# Patient Record
Sex: Male | Born: 1960 | Race: Black or African American | Hispanic: No | Marital: Single | Smoking: Current every day smoker
Health system: Southern US, Community
[De-identification: ages and names within clinical notes are randomized; demographics above are authoritative.]

## PROBLEM LIST (undated history)

## (undated) DIAGNOSIS — R39198 Other difficulties with micturition: Secondary | ICD-10-CM

## (undated) DIAGNOSIS — D649 Anemia, unspecified: Secondary | ICD-10-CM

## (undated) DIAGNOSIS — N400 Enlarged prostate without lower urinary tract symptoms: Secondary | ICD-10-CM

## (undated) DIAGNOSIS — K922 Gastrointestinal hemorrhage, unspecified: Secondary | ICD-10-CM

## (undated) DIAGNOSIS — N453 Epididymo-orchitis: Secondary | ICD-10-CM

## (undated) DIAGNOSIS — N4283 Cyst of prostate: Secondary | ICD-10-CM

## (undated) DIAGNOSIS — A599 Trichomoniasis, unspecified: Secondary | ICD-10-CM

## (undated) DIAGNOSIS — K859 Acute pancreatitis without necrosis or infection, unspecified: Secondary | ICD-10-CM

## (undated) DIAGNOSIS — E119 Type 2 diabetes mellitus without complications: Secondary | ICD-10-CM

## (undated) DIAGNOSIS — S82201A Unspecified fracture of shaft of right tibia, initial encounter for closed fracture: Secondary | ICD-10-CM

## (undated) DIAGNOSIS — F1010 Alcohol abuse, uncomplicated: Secondary | ICD-10-CM

## (undated) DIAGNOSIS — S82401A Unspecified fracture of shaft of right fibula, initial encounter for closed fracture: Secondary | ICD-10-CM

## (undated) DIAGNOSIS — N492 Inflammatory disorders of scrotum: Secondary | ICD-10-CM

## (undated) DIAGNOSIS — C187 Malignant neoplasm of sigmoid colon: Secondary | ICD-10-CM

## (undated) DIAGNOSIS — C801 Malignant (primary) neoplasm, unspecified: Secondary | ICD-10-CM

## (undated) DIAGNOSIS — K219 Gastro-esophageal reflux disease without esophagitis: Secondary | ICD-10-CM

## (undated) DIAGNOSIS — Z972 Presence of dental prosthetic device (complete) (partial): Secondary | ICD-10-CM

## (undated) DIAGNOSIS — R0600 Dyspnea, unspecified: Secondary | ICD-10-CM

## (undated) HISTORY — DX: Inflammatory disorders of scrotum: N49.2

## (undated) HISTORY — DX: Other difficulties with micturition: R39.198

## (undated) HISTORY — DX: Benign prostatic hyperplasia without lower urinary tract symptoms: N40.0

## (undated) HISTORY — PX: OTHER SURGICAL HISTORY: SHX169

## (undated) HISTORY — DX: Type 2 diabetes mellitus without complications: E11.9

## (undated) HISTORY — DX: Trichomoniasis, unspecified: A59.9

## (undated) HISTORY — PX: SCROTAL EXPLORATION: SHX2386

## (undated) HISTORY — DX: Epididymo-orchitis: N45.3

## (undated) HISTORY — PX: FRACTURE SURGERY: SHX138

---

## 1998-01-14 ENCOUNTER — Emergency Department (HOSPITAL_COMMUNITY): Admission: EM | Admit: 1998-01-14 | Discharge: 1998-01-14 | Payer: Self-pay | Admitting: Emergency Medicine

## 1999-03-16 ENCOUNTER — Encounter: Payer: Self-pay | Admitting: Emergency Medicine

## 1999-03-16 ENCOUNTER — Emergency Department (HOSPITAL_COMMUNITY): Admission: EM | Admit: 1999-03-16 | Discharge: 1999-03-16 | Payer: Self-pay | Admitting: Emergency Medicine

## 2000-02-11 ENCOUNTER — Emergency Department (HOSPITAL_COMMUNITY): Admission: EM | Admit: 2000-02-11 | Discharge: 2000-02-11 | Payer: Self-pay | Admitting: Emergency Medicine

## 2000-04-03 ENCOUNTER — Emergency Department (HOSPITAL_COMMUNITY): Admission: EM | Admit: 2000-04-03 | Discharge: 2000-04-03 | Payer: Self-pay | Admitting: Emergency Medicine

## 2004-05-19 ENCOUNTER — Emergency Department: Payer: Self-pay | Admitting: Emergency Medicine

## 2004-05-19 ENCOUNTER — Other Ambulatory Visit: Payer: Self-pay

## 2005-01-09 ENCOUNTER — Emergency Department: Payer: Self-pay | Admitting: Emergency Medicine

## 2005-01-09 ENCOUNTER — Other Ambulatory Visit: Payer: Self-pay

## 2005-01-21 ENCOUNTER — Inpatient Hospital Stay: Payer: Self-pay | Admitting: Internal Medicine

## 2005-09-07 ENCOUNTER — Emergency Department: Payer: Self-pay | Admitting: Emergency Medicine

## 2006-01-20 ENCOUNTER — Emergency Department: Payer: Self-pay | Admitting: Emergency Medicine

## 2006-02-15 ENCOUNTER — Other Ambulatory Visit: Payer: Self-pay

## 2006-02-15 ENCOUNTER — Emergency Department: Payer: Self-pay | Admitting: Emergency Medicine

## 2006-02-16 ENCOUNTER — Inpatient Hospital Stay: Payer: Self-pay | Admitting: Internal Medicine

## 2006-05-18 ENCOUNTER — Other Ambulatory Visit: Payer: Self-pay

## 2006-05-18 ENCOUNTER — Inpatient Hospital Stay: Payer: Self-pay | Admitting: Internal Medicine

## 2006-06-16 ENCOUNTER — Ambulatory Visit: Payer: Self-pay | Admitting: Pain Medicine

## 2006-09-07 ENCOUNTER — Emergency Department: Payer: Self-pay | Admitting: Emergency Medicine

## 2006-09-21 ENCOUNTER — Ambulatory Visit: Payer: Self-pay | Admitting: Nurse Practitioner

## 2006-09-28 ENCOUNTER — Ambulatory Visit: Payer: Self-pay | Admitting: Gastroenterology

## 2006-11-29 ENCOUNTER — Inpatient Hospital Stay: Payer: Self-pay | Admitting: Internal Medicine

## 2007-08-25 ENCOUNTER — Inpatient Hospital Stay: Payer: Self-pay | Admitting: Internal Medicine

## 2007-08-25 ENCOUNTER — Other Ambulatory Visit: Payer: Self-pay

## 2008-12-28 ENCOUNTER — Ambulatory Visit: Payer: Self-pay | Admitting: Oncology

## 2009-01-05 ENCOUNTER — Emergency Department: Payer: Self-pay | Admitting: Emergency Medicine

## 2009-01-08 ENCOUNTER — Ambulatory Visit: Payer: Self-pay | Admitting: Oncology

## 2009-01-24 ENCOUNTER — Ambulatory Visit: Payer: Self-pay | Admitting: Oncology

## 2009-01-28 ENCOUNTER — Ambulatory Visit: Payer: Self-pay | Admitting: Oncology

## 2009-01-30 ENCOUNTER — Ambulatory Visit: Payer: Self-pay | Admitting: Oncology

## 2009-02-28 ENCOUNTER — Ambulatory Visit: Payer: Self-pay | Admitting: Oncology

## 2009-05-20 ENCOUNTER — Emergency Department: Payer: Self-pay

## 2009-05-26 ENCOUNTER — Emergency Department: Payer: Self-pay | Admitting: Internal Medicine

## 2010-04-10 ENCOUNTER — Inpatient Hospital Stay: Payer: Self-pay | Admitting: *Deleted

## 2010-08-12 ENCOUNTER — Emergency Department: Payer: Self-pay | Admitting: Emergency Medicine

## 2011-01-02 ENCOUNTER — Emergency Department: Payer: Self-pay | Admitting: Emergency Medicine

## 2011-07-09 ENCOUNTER — Emergency Department: Payer: Self-pay

## 2011-07-09 LAB — CBC
HCT: 43.4 % (ref 40.0–52.0)
HGB: 14.9 g/dL (ref 13.0–18.0)
MCH: 31.4 pg (ref 26.0–34.0)
MCHC: 34.2 g/dL (ref 32.0–36.0)
MCV: 92 fL (ref 80–100)
Platelet: 144 10*3/uL — ABNORMAL LOW (ref 150–440)
RBC: 4.74 10*6/uL (ref 4.40–5.90)
RDW: 13 % (ref 11.5–14.5)
WBC: 8.6 10*3/uL (ref 3.8–10.6)

## 2011-07-09 LAB — URINALYSIS, COMPLETE
Bacteria: NONE SEEN
Bilirubin,UR: NEGATIVE
Blood: NEGATIVE
Glucose,UR: >=500 mg/dL (ref 0–75)
Leukocyte Esterase: NEGATIVE
Nitrite: NEGATIVE
Ph: 5 (ref 4.5–8.0)
Protein: NEGATIVE
RBC,UR: <1 /HPF (ref 0–5)
Specific Gravity: 1.031 (ref 1.003–1.030)
Squamous Epithelial: NONE SEEN
WBC UR: <1 /HPF (ref 0–5)

## 2011-07-09 LAB — COMPREHENSIVE METABOLIC PANEL
Albumin: 4 g/dL (ref 3.4–5.0)
Alkaline Phosphatase: 61 U/L (ref 50–136)
Anion Gap: 11 (ref 7–16)
BUN: 11 mg/dL (ref 7–18)
Bilirubin,Total: 0.5 mg/dL (ref 0.2–1.0)
Calcium, Total: 9.4 mg/dL (ref 8.5–10.1)
Chloride: 98 mmol/L (ref 98–107)
Co2: 25 mmol/L (ref 21–32)
Creatinine: 0.87 mg/dL (ref 0.60–1.30)
EGFR (African American): >60
EGFR (Non-African Amer.): >60
Glucose: 519 mg/dL (ref 65–99)
Osmolality: 291 (ref 275–301)
Potassium: 4 mmol/L (ref 3.5–5.1)
SGOT(AST): 36 U/L (ref 15–37)
SGPT (ALT): 40 U/L
Sodium: 134 mmol/L — ABNORMAL LOW (ref 136–145)
Total Protein: 8.2 g/dL (ref 6.4–8.2)

## 2011-07-09 LAB — ETHANOL
Ethanol %: <0.003 % (ref 0.000–0.080)
Ethanol: <3 mg/dL

## 2011-07-09 LAB — PROTIME-INR
INR: 0.9
Prothrombin Time: 12.7 secs (ref 11.5–14.7)

## 2011-07-09 LAB — LIPASE, BLOOD: Lipase: 51 U/L — ABNORMAL LOW (ref 73–393)

## 2012-09-09 LAB — CBC WITH DIFFERENTIAL/PLATELET
Basophil #: 0 10*3/uL (ref 0.0–0.1)
Basophil %: 0.4 %
Eosinophil #: 0.1 10*3/uL (ref 0.0–0.7)
Eosinophil %: 1.4 %
HCT: 53.2 % — ABNORMAL HIGH (ref 40.0–52.0)
HGB: 18 g/dL (ref 13.0–18.0)
Lymphocyte #: 1.1 10*3/uL (ref 1.0–3.6)
Lymphocyte %: 12.8 %
MCH: 31.9 pg (ref 26.0–34.0)
MCHC: 33.9 g/dL (ref 32.0–36.0)
MCV: 94 fL (ref 80–100)
Monocyte #: 0.8 x10 3/mm (ref 0.2–1.0)
Monocyte %: 8.7 %
Neutrophil #: 6.7 10*3/uL — ABNORMAL HIGH (ref 1.4–6.5)
Neutrophil %: 76.7 %
Platelet: 153 10*3/uL (ref 150–440)
RBC: 5.65 10*6/uL (ref 4.40–5.90)
RDW: 13.4 % (ref 11.5–14.5)
WBC: 8.7 10*3/uL (ref 3.8–10.6)

## 2012-09-09 LAB — COMPREHENSIVE METABOLIC PANEL
Albumin: 4.3 g/dL (ref 3.4–5.0)
Alkaline Phosphatase: 100 U/L (ref 50–136)
Anion Gap: 19 — ABNORMAL HIGH (ref 7–16)
BUN: 21 mg/dL — ABNORMAL HIGH (ref 7–18)
Bilirubin,Total: 0.7 mg/dL (ref 0.2–1.0)
Calcium, Total: 10 mg/dL (ref 8.5–10.1)
Chloride: 100 mmol/L (ref 98–107)
Co2: 11 mmol/L — ABNORMAL LOW (ref 21–32)
Creatinine: 1.16 mg/dL (ref 0.60–1.30)
EGFR (African American): >60
EGFR (Non-African Amer.): >60
Glucose: 400 mg/dL — ABNORMAL HIGH (ref 65–99)
Osmolality: 281 (ref 275–301)
Potassium: 4.9 mmol/L (ref 3.5–5.1)
SGOT(AST): 24 U/L (ref 15–37)
SGPT (ALT): 38 U/L (ref 12–78)
Sodium: 130 mmol/L — ABNORMAL LOW (ref 136–145)
Total Protein: 9.8 g/dL — ABNORMAL HIGH (ref 6.4–8.2)

## 2012-09-09 LAB — LIPASE, BLOOD: Lipase: 938 U/L — ABNORMAL HIGH (ref 73–393)

## 2012-09-09 LAB — MAGNESIUM: Magnesium: 2 mg/dL

## 2012-09-09 LAB — TSH: Thyroid Stimulating Horm: 2.52 u[IU]/mL

## 2012-09-09 LAB — ETHANOL
Ethanol %: <0.003 % (ref 0.000–0.080)
Ethanol: <3 mg/dL

## 2012-09-09 LAB — TROPONIN I: Troponin-I: <0.02 ng/mL

## 2012-09-10 ENCOUNTER — Inpatient Hospital Stay: Payer: Self-pay | Admitting: Specialist

## 2012-09-10 LAB — URINALYSIS, COMPLETE
Bilirubin,UR: NEGATIVE
Glucose,UR: >=500 mg/dL (ref 0–75)
Hyaline Cast: 6
Leukocyte Esterase: NEGATIVE
Nitrite: NEGATIVE
Ph: 5 (ref 4.5–8.0)
RBC,UR: 3 /HPF (ref 0–5)
Specific Gravity: 1.028 (ref 1.003–1.030)
Squamous Epithelial: NONE SEEN
WBC UR: 5 /HPF (ref 0–5)

## 2012-09-10 LAB — CBC WITH DIFFERENTIAL/PLATELET
Basophil #: 0 10*3/uL (ref 0.0–0.1)
Basophil %: 0.3 %
Eosinophil #: 0.3 10*3/uL (ref 0.0–0.7)
Eosinophil %: 3.2 %
HCT: 42.6 % (ref 40.0–52.0)
HGB: 14.4 g/dL (ref 13.0–18.0)
Lymphocyte #: 1.8 10*3/uL (ref 1.0–3.6)
Lymphocyte %: 21 %
MCHC: 33.9 g/dL (ref 32.0–36.0)
MCV: 93 fL (ref 80–100)
Monocyte #: 0.9 x10 3/mm (ref 0.2–1.0)
Monocyte %: 10.7 %
Neutrophil %: 64.8 %
RDW: 13.1 % (ref 11.5–14.5)
WBC: 8.6 10*3/uL (ref 3.8–10.6)

## 2012-09-10 LAB — DRUG SCREEN, URINE
Amphetamines, Ur Screen: NEGATIVE (ref ?–1000)
Barbiturates, Ur Screen: NEGATIVE (ref ?–200)
Cannabinoid 50 Ng, Ur ~~LOC~~: NEGATIVE (ref ?–50)
Opiate, Ur Screen: POSITIVE (ref ?–300)

## 2012-09-10 LAB — BASIC METABOLIC PANEL
Calcium, Total: 8.3 mg/dL — ABNORMAL LOW (ref 8.5–10.1)
Chloride: 112 mmol/L — ABNORMAL HIGH (ref 98–107)
Co2: 14 mmol/L — ABNORMAL LOW (ref 21–32)
Co2: 18 mmol/L — ABNORMAL LOW (ref 21–32)
Creatinine: 0.89 mg/dL (ref 0.60–1.30)
EGFR (African American): >60
EGFR (Non-African Amer.): >60
Glucose: 224 mg/dL — ABNORMAL HIGH (ref 65–99)
Glucose: 149 mg/dL — ABNORMAL HIGH (ref 65–99)
Potassium: 3.7 mmol/L (ref 3.5–5.1)
Potassium: 3.3 mmol/L — ABNORMAL LOW (ref 3.5–5.1)
Sodium: 139 mmol/L (ref 136–145)
Sodium: 140 mmol/L (ref 136–145)

## 2012-09-10 LAB — LIPASE, BLOOD: Lipase: 635 U/L — ABNORMAL HIGH (ref 73–393)

## 2012-09-11 LAB — CBC WITH DIFFERENTIAL/PLATELET
Basophil %: 0.2 %
Eosinophil #: 0.1 10*3/uL (ref 0.0–0.7)
Eosinophil %: 2.5 %
HGB: 12.3 g/dL — ABNORMAL LOW (ref 13.0–18.0)
Lymphocyte #: 1.4 10*3/uL (ref 1.0–3.6)
Lymphocyte %: 26.1 %
Neutrophil #: 3.5 10*3/uL (ref 1.4–6.5)
Neutrophil %: 63.6 %
Platelet: 110 10*3/uL — ABNORMAL LOW (ref 150–440)
RDW: 13 % (ref 11.5–14.5)

## 2012-09-11 LAB — BASIC METABOLIC PANEL
Anion Gap: 11 (ref 7–16)
BUN: 9 mg/dL (ref 7–18)
Calcium, Total: 7.3 mg/dL — ABNORMAL LOW (ref 8.5–10.1)
Co2: 16 mmol/L — ABNORMAL LOW (ref 21–32)
Creatinine: 0.65 mg/dL (ref 0.60–1.30)
EGFR (African American): >60
EGFR (Non-African Amer.): >60
Glucose: 188 mg/dL — ABNORMAL HIGH (ref 65–99)
Potassium: 3 mmol/L — ABNORMAL LOW (ref 3.5–5.1)

## 2012-09-11 LAB — LIPID PANEL
Ldl Cholesterol, Calc: 149 mg/dL — ABNORMAL HIGH (ref 0–100)
Triglycerides: 119 mg/dL (ref 0–200)
VLDL Cholesterol, Calc: 24 mg/dL (ref 5–40)

## 2012-09-11 LAB — LIPASE, BLOOD: Lipase: 414 U/L — ABNORMAL HIGH (ref 73–393)

## 2012-09-11 LAB — HEPATIC FUNCTION PANEL A (ARMC)
Alkaline Phosphatase: 61 U/L (ref 50–136)
Bilirubin,Total: 0.5 mg/dL (ref 0.2–1.0)
SGPT (ALT): 29 U/L (ref 12–78)
Total Protein: 5.9 g/dL — ABNORMAL LOW (ref 6.4–8.2)

## 2012-09-11 LAB — HEMOGLOBIN A1C: Hemoglobin A1C: 13 % — ABNORMAL HIGH (ref 4.2–6.3)

## 2012-09-12 LAB — BASIC METABOLIC PANEL
Anion Gap: 11 (ref 7–16)
BUN: 6 mg/dL — ABNORMAL LOW (ref 7–18)
Chloride: 111 mmol/L — ABNORMAL HIGH (ref 98–107)
Co2: 17 mmol/L — ABNORMAL LOW (ref 21–32)
Creatinine: 0.7 mg/dL (ref 0.60–1.30)
Sodium: 139 mmol/L (ref 136–145)

## 2012-09-15 LAB — CULTURE, BLOOD (SINGLE)

## 2013-08-08 ENCOUNTER — Ambulatory Visit: Payer: Self-pay

## 2013-11-12 ENCOUNTER — Emergency Department: Payer: Self-pay | Admitting: Emergency Medicine

## 2014-02-22 ENCOUNTER — Emergency Department: Payer: Self-pay | Admitting: Emergency Medicine

## 2014-07-20 ENCOUNTER — Emergency Department: Payer: Self-pay | Admitting: Emergency Medicine

## 2014-07-20 LAB — URINALYSIS, COMPLETE
Bacteria: NONE SEEN
Bilirubin,UR: NEGATIVE
Nitrite: NEGATIVE
PH: 6 (ref 4.5–8.0)
RBC,UR: 90 /HPF (ref 0–5)
Specific Gravity: 1.023 (ref 1.003–1.030)
WBC UR: 391 /HPF (ref 0–5)

## 2014-07-20 LAB — COMPREHENSIVE METABOLIC PANEL
ALK PHOS: 82 U/L
Albumin: 2.9 g/dL — ABNORMAL LOW (ref 3.4–5.0)
Anion Gap: 13 (ref 7–16)
BUN: 6 mg/dL — ABNORMAL LOW (ref 7–18)
Bilirubin,Total: 0.4 mg/dL (ref 0.2–1.0)
CALCIUM: 9.1 mg/dL (ref 8.5–10.1)
Chloride: 95 mmol/L — ABNORMAL LOW (ref 98–107)
Co2: 23 mmol/L (ref 21–32)
Creatinine: 0.8 mg/dL (ref 0.60–1.30)
EGFR (African American): >60
GLUCOSE: 293 mg/dL — AB (ref 65–99)
OSMOLALITY: 271 (ref 275–301)
Potassium: 3 mmol/L — ABNORMAL LOW (ref 3.5–5.1)
SGOT(AST): 20 U/L (ref 15–37)
SGPT (ALT): 20 U/L
SODIUM: 131 mmol/L — AB (ref 136–145)
TOTAL PROTEIN: 7.9 g/dL (ref 6.4–8.2)

## 2014-07-20 LAB — CBC WITH DIFFERENTIAL/PLATELET
BASOS PCT: 0.8 %
Basophil #: 0.2 10*3/uL — ABNORMAL HIGH (ref 0.0–0.1)
EOS PCT: 0.2 %
Eosinophil #: 0 10*3/uL (ref 0.0–0.7)
HCT: 41.4 % (ref 40.0–52.0)
HGB: 13.6 g/dL (ref 13.0–18.0)
LYMPHS ABS: 2 10*3/uL (ref 1.0–3.6)
LYMPHS PCT: 10 %
MCH: 30.2 pg (ref 26.0–34.0)
MCHC: 32.8 g/dL (ref 32.0–36.0)
MCV: 92 fL (ref 80–100)
MONOS PCT: 8.4 %
Monocyte #: 1.7 x10 3/mm — ABNORMAL HIGH (ref 0.2–1.0)
Neutrophil #: 16.2 10*3/uL — ABNORMAL HIGH (ref 1.4–6.5)
Neutrophil %: 80.6 %
Platelet: 250 10*3/uL (ref 150–440)
RBC: 4.5 10*6/uL (ref 4.40–5.90)
RDW: 13.2 % (ref 11.5–14.5)
WBC: 20.1 10*3/uL — AB (ref 3.8–10.6)

## 2014-08-06 ENCOUNTER — Emergency Department: Payer: Self-pay | Admitting: Emergency Medicine

## 2014-08-06 LAB — URINALYSIS, COMPLETE
BILIRUBIN, UR: NEGATIVE
Bacteria: NONE SEEN
NITRITE: NEGATIVE
Ph: 6 (ref 4.5–8.0)
Protein: NEGATIVE
Specific Gravity: 1.031 (ref 1.003–1.030)
WBC UR: 38 /HPF (ref 0–5)

## 2014-08-21 ENCOUNTER — Inpatient Hospital Stay: Payer: Self-pay | Admitting: Internal Medicine

## 2014-10-20 NOTE — Discharge Summary (Signed)
PATIENT NAMEKEROLOS, [name] MR#:  [id number] DATE OF BIRTH:  1960-09-20  DATE OF ADMISSION:  09/10/2012 DATE OF DISCHARGE:  09/12/2012  HISTORY AND PHYSICAL For a detailed note, please take a look at the history and physical done on admission by Dr. Bridgette Habermann.     DISCHARGE DIAGNOSES:  1. Acute diabetic ketoacidosis, now resolved.  2. Uncontrolled diabetes secondary to noncompliance.  3. Hypokalemia.  4. Hyperlipidemia.   DIET: The patient is being discharged on an American Diabetic Association, low-fat, low-sodium diet.   ACTIVITY: As tolerated.   DISCHARGE FOLLOWUP: Follow up with the [facility] in the next 1 to 2 weeks.   DISCHARGE MEDICATIONS: Lantus 40 units at bedtime, NovoLog 50 units in the a.m., metformin 500 mg daily, Tylenol with hydrocodone 1 tab q. 6 hours as needed, Zofran 4 mg q.i.d. as needed for nausea or vomiting, Pravachol 20 mg at bedtime and potassium 20 mEq b.i.d. x 5 days.   Stout COURSE: Dr. Gaylyn Cheers from Gastroenterology.   PERTINENT HOSPITAL DIAGNOSTIC AND RADIOLOGICAL DATA: Chest x-ray done on admission showing no evidence of acute pneumonia, pleural effusion, acute cardiopulmonary disease. A CT scan of the abdomen and pelvis done with contrast showing mild inflammatory changes associated with the pancreas, especially superiorly, appears to be related to a 6 to 7 mm in diameter calcified stone in the pancreatic head which results in pancreatic ductal dilatation. There is no pseudocyst, no evidence of solid mass.  Calcifications within the pancreas are consistent with chronic pancreatitis, fatty infiltration of the liver.   HOSPITAL COURSE: This is a 54 year old male with medical problems as mentioned above, presented to the hospital with abdominal pain, nausea, vomiting and noted to be in acute diabetic ketoacidosis and also acute pancreatitis.   1. Diabetic ketoacidosis: This was likely due to the patient's medical  noncompliance. The patient's hemoglobin A1c was noted to be 13, although he says he is compliant with his insulin. The patient was initially admitted to the intensive care unit, started on IV fluids, insulin drip. Serial metabolic profiles were drawn. Shortly after admission, the patient was taken off the insulin drip, put back on some sliding scale insulin, and eventually put back on his scheduled Lantus and NovoLog. His anion gap has closed. His blood sugars have remained stable. He has had no further nausea or vomiting. He will resume his Lantus and NovoLog and metformin as stated for his diabetes.  2. Acute pancreatitis: It is actually unclear why he developed acute pancreatitis.  This is likely acute on chronic pancreatitis. The patient has a strong history of alcohol abuse, although he says he has not been drinking now. The patient was treated supportively with IV fluids, antiemetics, pain control. His lipase has now improved. His diet was slowly advanced from a clear liquid eventually to a low-fat diet which he has been able to tolerate. His CT scan did not show any evidence of any pseudocyst. He will be discharged on a low-fat diet and followup with his primary care physician as an outpatient.  3. Hypokalemia: This was likely secondary to his nausea and vomiting and also being on an insulin drip. His potassium still remains to be low around 2.8. He is being discharged on p.o. potassium supplements.  4. Hyperlipidemia: The patient's lipid profile was noted to be significantly abnormal with an LDL of 149, total cholesterol of 197 with an HDL of 24. His triglycerides were also mildly elevated. He is currently being discharged on  a statin.   CODE STATUS: The patient is a FULL CODE.   TIME SPENT: 40 minutes.  ____________________________ Curtis Heman. Curtis Carmine, MD vjs:cb D: 09/12/2012 13:31:28 ET T: 09/12/2012 21:43:44 ET JOB#: [id number]  cc: Curtis Heman. Curtis Carmine, MD, <Dictator> [place] MD ELECTRONICALLY SIGNED 09/13/2012 8:19

## 2014-10-20 NOTE — Consult Note (Signed)
See NP/PA note.  Pt feeling some better, still tender, lipase down to 940.  Hx of pancreatic cyst removed with surgery 2008.  Mild tenderness now, bowel sounds scant.Will follow with you.  Electronic Signatures: Manya Silvas (MD)  (Signed on 14-Mar-14 19:36)  Authored  Last Updated: 14-Mar-14 19:36 by Manya Silvas (MD)

## 2014-10-20 NOTE — H&P (Signed)
PATIENT NAMECLAIR, [name] MR#:  [id number] DATE OF BIRTH:  Mar 09, 1961  DATE OF ADMISSION:  09/10/2012  REFERRING PHYSICIAN:  Dr. Renard Hamper.  PRIMARY CARE PHYSICIAN:  Dr. Quay Burow at [facility].   CHIEF COMPLAINT:  Abdominal pain.   HISTORY OF PRESENT ILLNESS:  The patient is a pleasant 54 year old African American male with history of alcoholic liver disease and chronic pancreatitis, likely from alcohol with multiple episodes of pancreatitis, status post removal of a pancreatic pseudocyst per patient and uncontrolled diabetes, tobacco abuse who presents with worsening abdominal pain for the past week or so.  The patient stated that the pain started around last Sunday, initially was in the left upper quadrant.  The pain has been progressive, currently 10 out of 10 and bandlike across the abdomen.  The patient has also had nausea, vomiting twice today.  There is no diarrhea, but the patient has chronic constipation.   The patient describes abdominal pain while eating for several years now and he has difficulty eating secondary to this.  The patient has had significant weight loss recently, about 30 pounds or so, in the last few months.  He is not on any pancreatic enzyme replacement.  Furthermore, he has had very poor by mouth intake in the last 3 days as he has worsening abdominal pain when he eats as well as the nausea, vomiting recently.  He was noted to have significant elevation of blood glucose on admission to 400 with anion gap metabolic acidosis.  I ordered an ABG as it was initially not ordered which shows significant acidosis with pH of 7.22.  He appears to be in DKA and severely dehydrated.  Also, his lipase is 938.  Hospitalist service were contacted for further evaluation and management.   PAST MEDICAL HISTORY: 1.  History of alcoholic liver disease.  2.  History of alcoholic pancreatitis, multiple episodes in the past.  3.  Likely chronic pancreatitis.  4.  History of diabetes.  5.   Tobacco abuse.  6.  History of alcohol abuse in the past.   ALLERGIES:  He states he is not allergic to any medications, but in the chart it says ULTRAM, but he denies having allergy to ULTRAM.   PAST SURGICAL HISTORY:  1.  Status post right arm fracture repair.  2.  Surgery of the right leg.  3.  Removal of pancreatic pseudocyst.   SOCIAL HISTORY:  Is a smoker at half a pack, smoking for greater than two decades.  Used to be a heavy drinker, currently stopped about six years per patient.  Denies any illicit drugs.  Is disabled.   FAMILY HISTORY:  Father with gout and blood pressure.  Mom with hypertension as well.   REVIEW OF SYSTEMS:  CONSTITUTIONAL:  Has chills, significant weight loss of about 30 pounds in the last 3 to 4 months.  EYES:  Chronic blurry vision.  EARS, NOSE, THROAT:  No tinnitus or hearing loss.  RESPIRATORY:  No cough, wheezing, shortness of breath.  CARDIOVASCULAR:  Denies chest pain, orthopnea or swelling in the legs.  GASTROINTESTINAL:  Positive for nausea, vomiting as above.  No diarrhea.  Was positive for constipation.  Had dark stools on Sunday.  Has abdominal pain as described above.  No rectal bleeding.  GENITOURINARY:  Denies dysuria, hematuria.  HEMATOLOGY AND LYMPHATIC:  Denies anemia or easy bruising.  SKIN:  Denies any rashes.  MUSCULOSKELETAL:  Denies arthritis.  NEUROLOGIC:  Denies history of TIA, seizures or ataxia.  PSYCHIATRIC:  Denies anxiety or insomnia.   PHYSICAL EXAMINATION: VITAL SIGNS:  Temperature 97.5.  Initial pulse was 105, respiratory rate 22, blood pressure 124/85, O2 sat 98% on room air.  GENERAL:  The patient is chronically ill-appearing African American male lying in bed in mild distress.  HEENT:  Normocephalic, atraumatic.  Pupils are equal and reactive.  Muddy sclerae.  Pale conjunctivae.  Dry mucous membranes with poor dentition.  NECK:  Supple.  No thyroid tenderness.  No cervical lymphadenopathy.  CARDIOVASCULAR:  S1, S2,  tachycardic.  No murmurs, rubs or gallops.  LUNGS:  Clear to auscultation without wheezing, rhonchi or rales.  ABDOMEN:  Soft, tenderness in palpation of the left upper quadrant.  No rebound or guarding.  Hyperactive bowel sounds.  There is healed oblique scar in the left quadrant.  EXTREMITIES:  No significant lower extremity edema.  There is some dry scaly skin in the lower extremity, but no pitting edema.  There is positive clubbing of fingernails.  No other rashes otherwise.  NEUROLOGIC:  Cranial nerves II through XII grossly intact.  Strength is 5 over 5 all extremities.  Sensation is intact to light touch.  PSYCHIATRIC:  Awake, alert, oriented x 3.  Cooperative, pleasant.   LABORATORY DATA:  Initial serum glucose was 400, BUN 21, creatinine 1.16, sodium 130, potassium 4.9.  Serum CO2 of 11 with anion gap of 19, lipase is 938.  Alcohol level below detectable level.  LFTs shows total protein of 9.8, otherwise within normal limits.  Troponin negative.  TSH was 2.52, WBC 8.5, hemoglobin is 18, and hematocrit is 53.2.  Of note, his previous hemoglobin was 14.9 January of last year.  UA, 2+ ketones, 2+ blood, no nitrites or leukocyte esterase, 5 WBCs.  ABG shows pH of 7.22, pCO2 22, pO2 of 105, lactate of 1.  EKG, normal sinus rhythm, possible left atrial enlargement.  No acute ST elevations or depressions.   ASSESSMENT AND PLAN:  We have a 54 year old African American male with history of chronic pancreatitis, uncontrolled diabetes, history of alcohol abuse in the past who presents with what appears to be more of a chronic abdominal pain, by mouth intake worsening in the last week associated with nausea, vomiting, severe dehydration, hyperglycemia with metabolic acidosis.  The patient has diabetic ketoacidosis with acidosis, hyperglycemia and ketonuria.  Would admit the patient to critical care unit and start insulin per diabetic ketoacidosis protocol with aggressive IV fluid resuscitation.  Furthermore,  patient appears to have acute on chronic pancreatitis.  The patient is not on any pancreatic enzyme replacement therapy and has signs of chronic abdominal pain and is actually afraid to eat as it can precipitate the pain depending on the type of food he has.  We would make the patient nothing by mouth for now, start the patient on morphine and Zofran for the symptomatic relief.  Would also obtain a CAT scan for further evaluation.  The patient has a healed surgical scar which likely is from a pseudocyst evacuation.  He does not see a gastroenterologist as an outpatient.  We will obtain a GI consult and trend the lipase.  He does not drink anymore, but I think the damage to the pancreas has likely been done and he probably needs chronic pancreatic enzyme replacement therapy.  We will check a hemoglobin A1c as his sugars are chronically in the 200s to 300s which does not help the picture.  Would hold the metformin for now.  The patient appears to be  significantly volume-depleted given the elevation of hemoglobin and hematocrit and physical exam signs of dehydration.  He would be volume-resuscitated with IV fluids.  He does smoke tobacco and he was counseled for three minutes about cessation.  We would monitor his blood sugars frequently per the diabetic ketoacidosis protocol and switch him to Lantus by discharge.   CODE STATUS:  THE PATIENT IS A FULL CODE.  TOTAL TIME SPENT:  Is 60 minutes of critical care time.     ____________________________ Vivien Presto, MD sa:ea D: 09/10/2012 02:12:58 ET T: 09/10/2012 02:57:02 ET JOB#: [id number]  cc: Vivien Presto, MD, <Dictator> Dr. Wenda Low [facility] MD ELECTRONICALLY SIGNED 09/21/2012 13:27

## 2014-10-20 NOTE — Consult Note (Signed)
PATIENT NAMEQUENTON, [name] MR#:  [id number] DATE OF BIRTH:  1960/09/12  DATE OF CONSULTATION:  09/10/2012  REFERRING PHYSICIAN:  Dr. Bridgette Habermann CONSULTING PHYSICIAN:  Janalyn Harder. Jerelene Redden, ANP  CONSULTING PHYSICIAN: Gaylyn Cheers, MD/Chrisette Man Jerelene Redden, ANP.   REASON FOR CONSULTATION:  Acute pancreatitis.   HISTORY OF PRESENT ILLNESS: This 54 year old male patient with history of alcoholic pancreatitis was admitted to the hospital for acute abdominal pain with lipase 272, metabolic acidosis and was admitted to the Intensive Care Unit. He has history of chronic alcohol abuse, discontinued 6 years ago per his report. He states on Sunday when he was eating bread and fatback, he developed a feeling that there was food not passing through the lower stomach and points to the epigastric area. He says it felt like food hung up there, he had increasing pressure, discomfort that was continuous. On Monday, he identified diffuse abdominal pain primarily in the epigastric region as a flare of pancreatitis. There was nausea, no vomiting, no change in bowel habits, fevers or chills. The patient states the pain became unbearable so he presented to the hospital. Admission labs with lipase 938, ABG with pH 7.22, CO2 22, bicarb 16.7 and he was diagnosed with acute pancreatitis with metabolic acidosis, admitted to the CCU.   Overnight, the patient reports he still has abdominal pain, 8-1/2 to 9 out of 10. He is receiving morphine 2 to 4 mg initially q. 4 hours, now it is q. 6 hours. He is on clear liquid diet. Tolerating liquids without vomiting. Still has an 8-1/2 to 9 out of 10 pain and is present when he awakens from sleep.   Previous records reviewed, and he does have a history of pancreatitis with previous surgical excision of pseudocyst at [facility] in 2008. An upper endoscopy performed 09/28/2006, epigastric pain revealed gastric mucosal erythema, normal esophagus, duodenitis without hemorrhage. The patient denies subsequent  luminal evaluation. He states he has had no further hospitalizations other than at [facility] and a pseudocyst surgery.   PAST MEDICAL HISTORY: 1.  Alcoholic liver disease.  2.  Alcoholic pancreatitis, multiple episodes in the past. 3.  Chronic pancreatitis with abdominal pain.  4.  History of diabetes mellitus.  5.  Tobacco abuse, ongoing.  6.  History of alcohol abuse, discontinued 6 years ago. 7.  GERD.   PAST SURGICAL HISTORY: 1.  Right arm fracture repair.  2.  Surgery of the right leg.  3.  Removal of pancreatic pseudocyst [facility] in 2008.   ALLERGIES:  The patient denies. THE CHART SHOWS ALLERGY TO ULTRAM.  HABITS: Positive tobacco, 1/2 pack to 1 pack per day x 2 decades. Prior history of alcohol abuse, reports discontinued. Says he has not drank anything for 6 years. A sister confirms.   SOCIAL HISTORY:  He says he is single, medical disability.   FAMILY HISTORY:  Father deceased with some type of cancer.  REVIEW OF SYSTEMS:  Ten systems reviewed, reports a 30-pound weight loss over the last 4 to 5 months. He says he has had no vomiting or diarrhea. Reports bowel habits are normal. Chronic abdominal discomfort with pancreatitis 9 out of 10. States he never took any pancreatic enzymes. The patient reports weight currently is 141 and he is 5 feet 8 inches. He has baseline shortness of breath, occasional cough, not bringing up any sputum. He denies fevers, positive some chills. Acute on chronic abdominal pain as noted in history; remaining 10 systems negative.   PHYSICAL EXAMINATION: VITAL SIGNS:  97.9, 74.  Respirations 16, 115/60; 100% pulse ox room air. GENERAL:  Thin African American male resting in bed visiting with sister, NAD. HEENT:  Shows sclerae anicteric. Conjunctivae pale pink. Oral mucosa is dry and intact.  NECK:  Supple. Trachea midline.  HEART:  Heart tones S1, S2 without murmur or gallop.  LUNGS: CTA. Respirations are nonlabored. The patient does have some episodes of  coughing.  ABDOMEN:  Soft. Bowel sounds present, positive tenderness epigastric left upper quadrant. No tenderness right upper quadrant.  RECTAL:  Deferred.  SKIN:  Warm and dry without rash or extremities without edema, positive muscle wasting, thin.  MUSCULOSKELETAL:  No joint inflammation, swelling. Gait not evaluated.   NEUROLOGIC:  Speech is somewhat difficult to understand. When he enunciates clearly, speech is clear. No tremors noted. PSYCHIATRIC:  Affect and mood within normal, appears comfortable.  He is cooperative, rubbing abdomen reporting abdominal discomfort.  LABORATORY DATA:  Admission labs notable for glucose 400, BUN 21, creatinine 1.16, sodium 130, potassium 4.9, CO2 11, anion gap 19, calcium 10, magnesium 2.0, lipase 938, ethanol 0.003%,. Albumin is 4.3, total bilirubin 0.7, alkaline phosphatase 100, ALT 38, troponin less than 0.02, TSH 2.52.  WBC 8.7, hemoglobin 18.0, hematocrit 53.2. Blood culture negative x 2. Urinalysis unremarkable ABG with pH 7.22, CO2 22, pO2 105, base excess is -16.7. Bicarb is 9.0. FiO2 room air.   REPEAT LABORATORY STUDIES 03/14, 0400:  Glucose 224, BUN 17, creatinine 0.89, calcium 8.3, hematocrit 42.6 after hydration. Repeat calcium 7.6, lipase 635.   RADIOLOGY: Single view chest x-ray negative. CT scan of the abdomen and pelvis with contrast performed 09/10/2012, 0218 with mild inflammatory change, especially with the pancreas, especially superiorly. There appears to be a 6 to 7 mm diameter calcified stone in the pancreatic head which results in pancreatic ductal dilatation. There is no pseudocyst or evidence of solid mass. Calcifications within the pancreas are consistent with chronic pancreatitis. There are fatty infiltration changes of the liver. There is likely a hemangioma in the right hepatic lobe which is stable. No acute urinary tract or acute bowel wall abnormality demonstrated.   IMPRESSION:  Suspect acute on chronic pancreatitis with possible  6 to 7 mm calcified stone in the pancreatic head which results in pancreatic ductal dilatation per CT study. The lipase has decreased some, liver labs unremarkable. The patient presented with diabetic ketoacidosis, very acidotic, improving. Hematocrit has improved with IV hydration. Calcium is decreasing. He still has 9 to 10 of generalized abdominal pain, with tenderness epigastria at left upper quadrant on exam. He is receiving small amounts of morphine without resolution. The patient reports chronic abdominal pain and says he has always had abdominal pain, rated 5 out 10 since his acute pancreatitis episode many years ago. He remains abstinent from alcohol per his report and sister confirms. Vital signs are stable. He is afebrile.  PLAN: 1.  Try to obtain some pain improvement with PCA pump. Dr. Vira Agar ordered records. 2.  Recommend sips of water, not full clear liquids at this time given abdominal pain.  3.  Stat CBC, liver panel and lipase in the morning.  4. Dr. Vira Agar reviewed this case in collaboration of case and no specific recommendations regarding the calcified stone in the pancreas. Continue supportive care and monitoring.  These services provided by Joelene Millin A. Jerelene Redden, MS, APRN, BC, ANP under collaborative agreement with Keith Rake, M.D.   Thank you for this consultation.      ____________________________ Janalyn Harder Jerelene Redden, ANP kam:ce D: 09/10/2012 13:56:32  ET T: 09/10/2012 14:42:18 ET JOB#: [id number]  cc: Joelene Millin A. Jerelene Redden, ANP, <Dictator> Janalyn Harder. Sherlyn Hay, MSN, ANP-BC Adult Nurse Practitioner ELECTRONICALLY SIGNED 09/13/2012 8:01

## 2014-10-20 NOTE — Consult Note (Signed)
CC: acute on chronic pancreatitis.  Pt abd not tender now.  Agree with advancing diet to full liquids.  Lipase down to normal, hgb A1c up to 13.  He needs teaching in how to control his diabetes.  Hyperglycemia can sometimes be a cause for pancreatitis. Home soon.  Electronic Signatures: Manya Silvas (MD)  (Signed on 15-Mar-14 12:27)  Authored  Last Updated: 15-Mar-14 12:27 by Manya Silvas (MD)

## 2014-10-23 LAB — SURGICAL PATHOLOGY

## 2014-10-29 NOTE — Consult Note (Signed)
Chief Complaint:  Subjective/Chief Complaint Doing well this AM, reports feeling better than yesterday.  No increased pain, n/v or fevers.   VITAL SIGNS/ANCILLARY NOTES: **Vital Signs.:   25-Feb-16 05:14  Vital Signs Type Routine  Temperature Temperature (F) 98.2  Celsius 36.7  Temperature Source oral  Pulse Pulse 72  Respirations Respirations 18  Systolic BP Systolic BP 254  Diastolic BP (mmHg) Diastolic BP (mmHg) 78  Mean BP 100  Pulse Ox % Pulse Ox % 99  Pulse Ox Activity Level  At rest  Oxygen Delivery Room Air/ 21 %    07:39  Vital Signs Type Q 8hr  Temperature Temperature (F) 98.5  Celsius 36.9  Temperature Source oral  Pulse Pulse 70  Respirations Respirations 18  Systolic BP Systolic BP 270  Diastolic BP (mmHg) Diastolic BP (mmHg) 82  Mean BP 98  Pulse Ox % Pulse Ox % 99  Pulse Ox Activity Level  At rest  Oxygen Delivery Room Air/ 21 %  *Intake and Output.:   25-Feb-16 00:13  Grand Totals Intake:  50 Output:      Net:  48 24 Hr.:  733  IV (Secondary)      In:  50    00:13  Urinary Method  Void; Up to BR  Stool  small loose    05:08  Grand Totals Intake:  100 Output:      Net:  100 24 Hr.:  623  IV (Secondary)      In:  100    05:15  Grand Totals Intake:   Output:  200    Net:  -200 24 Hr.:  633  Urine ml     Out:  200    05:15  Urinary Method  Urinal    Shift 07:00  Grand Totals Intake:  600 Output:  500    Net:  100 24 Hr.:  633  Oral Intake      In:  400  IV (Secondary)      In:  200  Urine ml     Out:  500  Length of Stay Totals Intake:  3273 Output:  2030    Net:  7628    Daily 07:00  Grand Totals Intake:  1483 Output:  850    Net:  315 17 Hr.:  616  Oral Intake      In:  760  IV (Primary)      In:  523  IV (Secondary)      In:  200  Urine ml     Out:  850  Length of Stay Totals Intake:  3273 Output:  2030    Net:  0737    07:45  Grand Totals Intake:   Output:  125    Net:  -125 24 Hr.:  -125  Urine ml     Out:  125     07:45  Urinary Method  Urinal    Shift 15:00  Grand Totals Intake:   Output:  125    Net:  -125 24 Hr.:  -125  Urine ml     Out:  125  Length of Stay Totals Intake:  3273 Output:  2155    Net:  1062   Brief Assessment:  GEN well developed, well nourished, no acute distress   Cardiac Regular   Respiratory normal resp effort  clear BS   Gastrointestinal Normal   Gastrointestinal details normal Soft  Nontender  Nondistended   EXTR negative cyanosis/clubbing, negative edema  Additional Physical Exam Scrotum with mild edema, wound with wet to dry dressing.  Wound bed pink wtih minimal granulation tissue. Small area of granualtion tissue removed revealing pink well profused tissue beneath. Skin edges intact, viable appearing.  Slightly greyish area seen yesterday has improved.  No drainage.  Right testicle enlarged, firm, but continues appear viable.   Lab Results:  Routine Micro:  22-Feb-16 22:56   Micro Text Report BLOOD CULTURE   COMMENT                   NO GROWTH IN 48 HOURS   ANTIBIOTIC                       Micro Text Report BLOOD CULTURE   COMMENT                   NO GROWTH IN 48 HOURS   ANTIBIOTIC                       Culture Comment NO GROWTH IN 57 HOURS  Result(s) reported on 23 Aug 2014 at 11:00PM.  Culture Comment NO GROWTH IN 48 HOURS  Result(s) reported on 23 Aug 2014 at 11:00PM.  23-Feb-16 15:02   Micro Text Report WOUND AER/ANAEROBIC CULT   COMMENT                   NO GROWTH IN 8-12 HOURS   ANTIBIOTIC                       Specimen Source SCROTUM  Culture Comment NO GROWTH IN 8-12 HOURS  Result(s) reported on 23 Aug 2014 at 12:00PM.  Routine Chem:  22-Feb-16 22:36   Glucose, Serum  584  BUN 8  Creatinine (comp) 0.86  Sodium, Serum  135  Potassium, Serum 4.2  Chloride, Serum 100  CO2, Serum 28  Calcium (Total), Serum 9.5  Anion Gap 7  Osmolality (calc) 295  eGFR (African American) >60  eGFR (Non-African American) >60 (eGFR values  <33m/min/1.73 m2 may be an indication of chronic kidney disease (CKD). Calculated eGFR, using the MRDR Study equation, is useful in  patients with stable renal function. The eGFR calculation will not be reliable in acutely ill patients when serum creatinine is changing rapidly. It is not useful in patients on dialysis. The eGFR calculation may not be applicable to patients at the low and high extremes of body sizes, pregnant women, and vegetarians.)  Result Comment GLUCOSE - RESULTS VERIFIED BY REPEAT TESTING.  - NOTIFIED OF CRITICAL VALUE  - [id number]_[id number]  08-21-14..Marland KitchenJO  - READ-BACK PROCESS PERFORMED.  Result(s) reported on 22 Aug 2014 at 12:02AM.  23-Feb-16 07:42   Hemoglobin A1c ([facility]  13.3 (The American Diabetes Association recommends that a primary goal of therapy should be <7% and that physicians should reevaluate the treatment regimen in patients with HbA1c values consistently >8%.)  Hemoglobin A1c ([facility])  13.3 (The American Diabetes Association recommends that a primary goal of therapy should be <7% and that physicians should reevaluate the treatment regimen in patients with HbA1c values consistently >8%.)  Glucose, Serum  160  BUN 8  Creatinine (comp) 0.61  Sodium, Serum 140  Potassium, Serum 3.5  Chloride, Serum 107  CO2, Serum 26  Calcium (Total), Serum  8.4  Anion Gap 7  Osmolality (calc) 281  eGFR (African American) >60  eGFR (Non-African American) >60 (eGFR values <631mmin/1.73 m2  may be an indication of chronic kidney disease (CKD). Calculated eGFR, using the MRDR Study equation, is useful in  patients with stable renal function. The eGFR calculation will not be reliable in acutely ill patients when serum creatinine is changing rapidly. It is not useful in patients on dialysis. The eGFR calculation may not be applicable to patients at the low and high extremes of body sizes, pregnant women, and vegetarians.)  Routine Hem:  22-Feb-16 22:36   WBC (CBC)  5.9  RBC (CBC)  4.36  Hemoglobin (CBC)  12.8  Hematocrit (CBC)  39.2  Platelet Count (CBC) 372 (Result(s) reported on 21 Aug 2014 at 10:50PM.)  MCV 90  MCH 29.4  MCHC 32.7  RDW 13.4  23-Feb-16 07:42   WBC (CBC) 6.3  RBC (CBC)  3.84  Hemoglobin (CBC)  11.2  Hematocrit (CBC)  34.0  Platelet Count (CBC) 318  MCV 89  MCH 29.1  MCHC 32.9  RDW 13.3  Neutrophil % 49.5  Lymphocyte % 37.0  Monocyte % 9.6  Eosinophil % 3.2  Basophil % 0.7  Neutrophil # 3.1  Lymphocyte # 2.3  Monocyte # 0.6  Eosinophil # 0.2  Basophil # 0.0 (Result(s) reported on 22 Aug 2014 at 07:59AM.)   Assessment/Plan:  Assessment/Plan:  Assessment 54 yo poorly controlled diabetic male POD 2 s/p scrotal debrisment/ exploration.  Wound continues to show improvement, no evideince of residual nectrosis, no drainage.  Right testicle viable but enlarged/ infected.  No plan for further debridement in OR.   Plan 1. Continue IV abx 2. Wet to dry dressing changes bid, Urology to perform AM wound dressing.  If wound continues to improve tomorrow AM will recommend discharge with home health involvement. 3. May resume diabetic diet. No plan for further debridement in OR at this time. 4. Tight glucose control for wound healing 5. Wound cultures showing no bacterial growth. Recommend consultation with Infectious Disease concerning discharge antibiotics as patient failed Doxycycline, Bactrim, Cipro and Flagyl as outpatient.  Please call with any questions or concerns   Electronic Signatures: Sherlynn Stalls (MD)  (Signed 25-Feb-16 10:01)  Authored: Assessment/Plan  Co-Signer: Chief Complaint, VITAL SIGNS/ANCILLARY NOTES, Brief Assessment, Lab Results, Assessment/Plan Herbert Moors (NP)  (Signed [phone] 09:32)  Authored: Chief Complaint, VITAL SIGNS/ANCILLARY NOTES, Brief Assessment, Lab Results, Assessment/Plan   Last Updated: 25-Feb-16 10:01 by Sherlynn Stalls (MD)

## 2014-10-29 NOTE — Consult Note (Signed)
Brief Consult Note: Diagnosis: Scrotal abscess, very poorly controled DM.   Patient was seen by consultant.   Consult note dictated.   Recommend to proceed with surgery or procedure.   Recommend further assessment or treatment.   Orders entered.   Discussed with Attending MD.   Comments: Check HIV Augmentin 875 bid for 10day FU urology.  I can see if urology needs further assistance with abx management or if he worsens.  Electronic Signatures: Angelena Form (MD)  (Signed [phone] 13:57)  Authored: Brief Consult Note   Last Updated: 26-Feb-16 13:57 by Angelena Form (MD)

## 2014-10-29 NOTE — Consult Note (Signed)
PATIENT NAME:  [name], [name] MR#:  [id number] DATE OF BIRTH:  23-Jul-1960  DATE OF CONSULTATION:  08/25/2014  REFERRING PHYSICIAN:  Epifanio Lesches, MD CONSULTING PHYSICIAN:  Cheral Marker. Ola Spurr, MD  REASON FOR CONSULTATION: Scrotal abscess.   HISTORY OF PRESENT ILLNESS: This is a pleasant 54 year old gentleman with very poorly controlled diabetes as well as prior heavy alcohol use leading to chronic pancreatitis and alcoholic liver disease as well as ongoing tobacco abuse. He has had almost a month of increasing issues with his urological system. He was originally seen in the Emergency Room with difficulty urinating and says he was tested for a urinary infection and started on an antibiotic, which he cannot recall the name of. He then started to develop scrotal swelling. The first urinalysis available in our system was from January 21st with 391 white cells. Unfortunately, no culture was done at that time. The patient then presented again February 7th with scrotal swelling. Urinalysis at that time had 38 whites, but again no culture was done. He had ultrasound February 7th showing right-sided epididymitis, orchitis. He was treated again with oral antibiotics. Represented February 23rd with drainage from the site and underwent surgical drainage on February 23rd. He has been on IV antibiotics since then. Cultures were negative. We are consulted for further antibiotic management. Of note, he also had diagnosis of trichomonas noted and was treated over the last month with Bactrim and Flagyl. He had negative testing for GC and Chlamydia. He also had addition of doxycycline to his antibiotics.   PAST MEDICAL HISTORY: 1.  Alcoholic liver disease.  2.  Alcoholic pancreatitis.  3.  Chronic pancreatitis.  4.  History of diabetes complicated by DKA.   PAST SURGICAL HISTORY: Right arm fracture, right leg surgery and removal of pancreatic pseudocyst.  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 Lives with his mother. Sexually active with men only. Has had HIV testing in the past, per report.  FAMILY HISTORY: Positive for gout and high blood pressure.   ALLERGIES: ULTRAM.  REVIEW OF SYSTEMS: Eleven systems reviewed and negative except as per HPI.   ANTIBIOTICS SINCE ADMISSION: Include clindamycin begun February 22nd through the 25th, Zosyn begun February 22nd through the 25th, ceftriaxone given February 22nd.   PHYSICAL EXAMINATION: VITAL SIGNS: Temperature 98, pulse 72, blood pressure 128/84, respirations 18, sat 99% on room air. The patient has been afebrile since admission.  GENERAL: He is disheveled. He is somewhat of a poor historian.  HEENT: Pupils reactive. Sclerae anicteric. Oropharynx clear with no thrush. He has very poor dentition.  NECK: Supple.  HEART: Regular.  LUNGS: Clear.  ABDOMEN: Soft, nontender, nondistended.  EXTREMITIES: No clubbing, cyanosis or edema.  SCROTUM: He has a wound on the right scrotum. This is packed. With removal of the packing there is good granulation tissue and no evidence of purulence.  DIAGNOSTIC DATA: Ultrasound of the testicles February 22nd showed significant sonographic worsening of right epididymitis since the prior study with increased prominence and edema of the entire epidermis as well as overlying scrotal skin thickening. No discrete focal abscess was identified. However, a phlegmon change is noted surrounding the epididymis. There is also mass effect on the right testicle from the enlarged epididymis.   White blood count on February 22nd was 5.9, hemoglobin 12.1 and platelets 272,000.   Blood cultures x2: No growth, February 22nd. Scrotum abscess culture is growing only 2 species of Coag-negative Staphylococcus, yet to be  fully identified. No anaerobes are identified. There were moderate white blood cells seen, but no organisms.   His A1c is 13.3.   IMPRESSION: A 54 year old gentleman with  very poorly controlled diabetes with an A1c of 13.3 admitted February 23rd with a month of urinary symptoms that began as difficulty urinating and diagnosis of a urinary tract infection. He has been treated with antibiotics. Unfortunately, we have no culture data from his prior urinary infections. He developed a scrotal/abscess and some necrotic tissue on the scrotum. This has been excised on February 23rd. The site currently looks very good. Cultures are really unrevealing.   I suspect that source control is the most important issue in this management of this infection and that has been adequately achieved. The site looks very good.   RECOMMENDATIONS: 1.  Check human immunodeficiency virus test, which I have added on. 2.  Augmentin 875 twice a day for another 10 days.  3.  Follow up with urology.  4.  I can see if needed for further antibiotic management if this worsens.   Thank you for the consult. I will be glad to follow with you.   ____________________________ Cheral Marker. Ola Spurr, MD dpf:sb D: 08/25/2014 13:56:06 ET T: 08/25/2014 17:05:36 ET JOB#: [id number]  cc: Cheral Marker. Ola Spurr, MD, <Dictator> Sander Speckman Ola Spurr MD ELECTRONICALLY SIGNED 08/31/2014 19:53

## 2014-10-29 NOTE — Op Note (Signed)
PATIENT NAME:  [name], [name] MR#:  [id number] DATE OF BIRTH:  Dec 01, 1960  DATE OF PROCEDURE:  08/22/2014  PREOPERATIVE DIAGNOSES: Right epididymoorchitis, scrotal necrosis/abscess/gangrene.   POSTOPERATIVE DIAGNOSES: Right epididymoorchitis, scrotal necrosis/abscess/gangrene.   PROCEDURE PERFORMED: Scrotal exploration with debridement, wound washout.   ANESTHESIA: General anesthesia.   ATTENDING SURGEON: Sherlynn Stalls, MD   ESTIMATED BLOOD LOSS: Minimal.   DRAINS: None.   COMPLICATIONS: None.   SPECIMENS: Wound culture, necrotic scrotal tissue.   COMPLICATIONS: None.   INDICATION: This is a 54 year old poorly controlled diabetic with worsening right epididymo-orchitis. He has developed what appears to be dry gangrene in the abscess cavity in the deep-ended portion of his scrotum, just underneath the right testicle. He was counseled to go to the operating room today for debridement of this necrotic tissue, as well as exploration and possible orchiectomy if deemed necessary. Risks and benefits of the procedure were explained in detail. The patient agreed to proceed as planned.   PROCEDURE IN DETAIL: The patient was correctly identified in the preoperative holding area and informed consent was confirmed. He was brought to the operating suite and placed on the table in the supine position. At this time, universal timeout protocol was performed. All team members were identified. Venodyne boots were placed and the patient had already received IV Zosyn and clindamycin in the perioperative period. He was then placed under general anesthesia and shaved. His scrotum and penis were prepped using Betadine scrub and paint. At this point in time, the approximately 4 cm area of necrosis was excised using a knife. The necrotic tissue was debrided down to viable tissue that bled. The right testicle was somewhat socked into this tissue, but the testicle itself appeared to be viable within its rind of  inflammatory tissue. I did bring in a Doppler ultrasound to Doppler the testicle, and it had adequate blood flow, proving its viability. Once all the necrotic tissue had been debrided, the wound was washed out copiously using vancomycin and gentamicin irrigation. A wound culture was performed at the beginning of the case into the abscess cavity for additional microbiology. Some of the small bleeding areas were coagulated using Bovie electrocautery. There was some mild oozing from the skin edges at the end of the case. The wound was then packed using antibiotic-soaked Kerlix. Additional dressing of an ABD pad was applied to this, and a scrotal support device was applied to keep the packing in place. He was reversed from anesthesia and taken to the PACU in stable condition. There were no complications in this case.   PLAN: I will change the packing out tomorrow morning and examine the wound bed. The patient can have a diet tonight, but I prefer him to stay n.p.o. at midnight, at least until he has been reassessed in the morning to see if he needs to return for a second look.    ____________________________ Sherlynn Stalls, MD ajb:mw D: 08/22/2014 17:19:28 ET T: 08/23/2014 03:08:33 ET JOB#: [id number]  cc: Sherlynn Stalls, MD, <Dictator> Sherlynn Stalls MD ELECTRONICALLY SIGNED 09/06/2014 13:44

## 2014-10-29 NOTE — Consult Note (Signed)
PATIENT NAME:  [name], [name] MR#:  [id number] DATE OF BIRTH:  07-24-1960  DATE OF CONSULTATION:  08/22/2014  REFERRING PHYSICIAN:  Harvest Dark, MD CONSULTING PHYSICIAN:  Sherlynn Stalls, MD  PRIMARY CARE PHYSICIAN: Ellin Goodie, MD  CHIEF COMPLAINT: Worsening epididymoorchitis, scrotal drainage.   HISTORY OF PRESENT ILLNESS: This is a 54 year old African American male with a history of diabetes who presented to the Emergency Room originally on February 7th with right testicular pain and enlargement. He was diagnosed with epididymoorchitis and started on Cipro. He was seen at [place] by the nurse practitioner on February 16th at which time his symptoms had failed to improve on Cipro, therefore, his antibiotics were switched to Bactrim as well as Flagyl. He was diagnosed at this time with trichomonas which was seen microscopically in his urine. His UA and urine cultures were negative as well as GC and Chlamydia. He returned to the office 3 days later and his symptoms continued to worsen. Doxycycline was added to his combination of antibiotics, and he was advised to present to the Emergency Room over the weekend if his symptoms did not improve. Unfortunately, over the weekend, he developed scrotal drainage and discharge without fevers or chills. He returned back to our office yesterday morning and was noted to have drainage from his scrotum. He was arranged to have an outpatient scrotal ultrasound in the morning, but failed to present for the study. He ended up presenting to the Emergency Room late last night for further treatment and evaluation. He denies any fevers or chills. He denies any urinary symptoms and has been voiding well.  PAST MEDICAL HISTORY:  1.  Alcoholic liver disease.  2.  Alcoholic pancreatitis times multiple occurrences.  3.  Chronic pancreatitis. 4.  Diabetic ketoacidosis.  SOCIAL HISTORY: Smokes a half pack a day, former alcoholic but no longer drinks  alcohol. Denies any illicit drug use.   PAST SURGICAL HISTORY:  1.  Right arm fracture.  2.  Right leg surgery.  3.  Removal of pancreatic pseudocyst.   FAMILY HISTORY: Father had gout and high blood pressure. Mother had hypertension.   ALLERGIES: ULTRAM.   HOME MEDICATIONS: Oxycodone 10 mg p.o. q. 4 hours, naproxen 500 mg p.o. b.i.d., methocarbamol 750 mg 2 tablets 3 times a day, Lantus 40 units daily, acetaminophen/oxycodone 325/5 mg p.o. q. 4 hours.  REVIEW OF SYSTEMS: A 12 point review of systems was performed and is negative other than as per HPI.  PHYSICAL EXAMINATION: VITAL SIGNS: T-max 97.5, T-current 97.4, pulse 78, blood pressure 99/67, respirations 18, 98% on room air.  GENERAL: No acute distress. Alert and oriented. Lying in hospital bed this morning with a few complaints.  HEENT: Normocephalic, atraumatic. Moist mucous membranes.  NECK: Trachea is midline. No masses palpable.  LUNGS: No increased work of breathing. No retractions or use of accessory muscles.  CARDIOVASCULAR: No clubbing, cyanosis or edema bilaterally.  ABDOMEN: Soft, nontender, nondistended. No rebound or guarding. Bladder is nonpalpable.  GENITOURINARY: Circumcised phallus without any urethral discharge. Normal urethral meatus in an orthotopic position. Scrotum with a firm, indurated right testicle. The base of scrotum is somewhat fluctuant with an approximately 4 cm area of what appears to be dry gangrene skin changes at the base of the scrotum. Just above this, there is an open area with serous, slightly purulent-tinged drainage and possibly liquefactive necrosis in this area. No crepitus. Perineum is intact without any concern for extension into this area.  EXTREMITIES: Moving all 4 extremities  spontaneously. NEUROLOGIC: Alert and oriented x3, grossly intact. Cranial nerves also grossly intact. No focal deficits.  SKIN: No other rashes, lesions or bruises. PSYCH: Normal affect. No depression.    DIAGNOSTIC DATA: On admission, BUN is 8 and creatinine 0.86. Blood glucose was 584. WBC is 5.9, hemoglobin 12.8, hematocrit 39.2 and platelets 372,000.  Blood cultures x2 show no growth to date. UA and urine culture not obtained, but was negative in our office.  Scrotal ultrasound shows normal left testicle, but severe right enlargement of his testicle and epididymis with some mass effect on the adjacent testicle. There is also evidence of skin thickening and hyperemia consistent with severe epididymoorchitis. There is good blood flow to this testicle and no evidence of focal access. These images were personally reviewed by myself today.   ASSESSMENT:  A 53 year old poorly controlled diabetic presenting with worsening right epididymoorchitis and now with evidence of necrosis and possible abscess formation in the dependent scrotum which has been necessitating, therefore, there is no discrete obvious collection on ultrasound. There is an area which appears to require debridement and I have counseled the patient that I would like to proceed with scrotal exploration and debridement of the necrotic infected tissue in the operating room today. We discussed risks and benefits of the procedure including possible risk of right orchiectomy if the testicle appears unsalvageable. He has been made n.p.o. and added on to the operating room schedule. Informed consent was reviewed and will be obtained by the nurse. He is currently on IV clindamycin for gram-positive coverage and ceftriaxone for gram-negative coverage. If infection fails to improve on these antibiotics, we will consider getting infectious disease involved given that this infection has failed to clear with multiple antibiotics.   PLAN:  1.  Plan to proceed with scrotal exploration, debridement, and possible right orchiectomy in the operating room today. Please ensure the patient remains n.p.o. He is added on to the operating room schedule for this  afternoon.  2.  Continue IV antibiotics. I would consider infectious disease consult as he has failed outpatient management with Cipro, Bactrim, Flagyl and doxycycline with worsening epididymoorchitis.  3.  Please ensure that the patient has good glucose control, which will be important to wound healing. On presentation his blood sugar was out of control, which is likely not helping his infection. 4.  I will follow the patient very closely.   Thank you for allowing me to participate in the care of this patient.   ____________________________ Sherlynn Stalls, MD ajb:sb D: 08/22/2014 10:38:30 ET T: 08/22/2014 11:03:59 ET JOB#: [id number]  cc: Sherlynn Stalls, MD, <Dictator> Sherlynn Stalls MD ELECTRONICALLY SIGNED 08/22/2014 12:41

## 2014-10-29 NOTE — Consult Note (Signed)
Chief Complaint:  Subjective/Chief Complaint Doing well this AM, overall feeling better.  Minimal pain.  No n/v or fevers.   VITAL SIGNS/ANCILLARY NOTES: **Vital Signs.:   24-Feb-16 07:31  Vital Signs Type Q 8hr  Temperature Temperature (F) 97.4  Celsius 36.3  Temperature Source oral  Pulse Pulse 70  Respirations Respirations 18  Systolic BP Systolic BP 786  Diastolic BP (mmHg) Diastolic BP (mmHg) 78  Mean BP 90  Pulse Ox % Pulse Ox % 100  Pulse Ox Activity Level  At rest  Oxygen Delivery Room Air/ 21 %  *Intake and Output.:   Daily 24-Feb-16 07:00  Grand Totals Intake:  990 Output:  680    Net:  310 24 Hr.:  310  Oral Intake      In:  240  IV (Primary)      In:  650  IV (Secondary)      In:  100  Urine ml     Out:  680  Length of Stay Totals Intake:  1790 Output:  1180    Net:  610   Brief Assessment:  GEN well developed, well nourished, no acute distress   Cardiac Regular   Respiratory normal resp effort  clear BS   Gastrointestinal Normal   Gastrointestinal details normal Soft  Nontender  Nondistended   EXTR negative cyanosis/clubbing   Additional Physical Exam Scrotum with mild edema, wound with wet to dry dressing.  Wound bed pink wtih scan granulation tissue.  Skin edges intact, viable appearing.  Slighly greyish appearance just below skin edge at superior aspect of wound, 0.5 mm but not clearly nectrotic.  No drainage.  Right testicle enlarged, firm, but appears viable.   Lab Results: Routine Chem:  23-Feb-16 07:42   Hemoglobin A1c ([facility])  13.3 (The American Diabetes Association recommends that a primary goal of therapy should be <7% and that physicians should reevaluate the treatment regimen in patients with HbA1c values consistently >8%.)   Assessment/Plan:  Assessment/Plan:  Assessment 54 yo poorly controlled diabetic male POD 1 s/p scrotal debrisment/ exploration.  Wound appears improved this AM, no evideince of residual nectrosis, no drainage.   Right testicle viable but enlarged/ infected.  No need for further debridement this AM but will conintue to monitor closely.   Plan 1. continue IV abx 2. wet to dry dressing changes bid, Urology to perform AM wound dressing 3. may advance diet this AM, NPO at MN again after wound is reassessed 4. f/u wound culture data 5. Tight glucose control for wound healing   Please call with any questions or concerns   Electronic Signatures: Sherlynn Stalls (MD)  (Signed 24-Feb-16 08:30)  Authored: Chief Complaint, VITAL SIGNS/ANCILLARY NOTES, Brief Assessment, Lab Results, Assessment/Plan   Last Updated: 24-Feb-16 08:30 by Sherlynn Stalls (MD)

## 2014-10-29 NOTE — H&P (Signed)
PATIENT NAME:  [name], [name] MR#:  [id number] DATE OF BIRTH:  1960-10-07  DATE OF ADMISSION:  08/21/2014  PRIMARY CARE PHYSICIAN: [name], MD  REFERRING PHYSICIAN: Verdia Kuba. Paduchowski, MD  CHIEF COMPLAINT: Scrotal swelling for 1 month, worsening for several days with discharge.   HISTORY OF PRESENT ILLNESS: A 54 year old [place] American male with a history of diabetes and hyperlipidemia and pancreatitis presented to the ED with the above chief complaint. The patient is alert, awake, oriented, in no acute distress. The patient said that he has had scrotal swelling and tenderness for the past 1 month. The patient has been treated with antibiotic but the patient does not know the antibiotic's name. Scrotal sweating has been worsening for the past few days. In addition, the patient noticed some discharge. The patient denies any fever or chills. No dysuria, hematuria, or incontinence. The patient feels tenderness of the scrotum. ED physician, Dr. Kerman Passey, discussed with on-call urologist, Dr. Erlene Quan, who suggested admit under medical service, given antibiotics, and we will get a procedure tomorrow.   PAST MEDICAL HISTORY: Alcoholic liver disease, alcoholic pancreatitis multiple times, chronic pancreatitis, history of diabetes, DKA.   SOCIAL HISTORY: Smokes 1/2 pack a day for many years. Quit alcohol drinking a long time ago. Denies drinking any alcohol. Denies any illicit drugs.   PAST SURGICAL HISTORY: Right arm fracture repair, right leg surgery, removal of pancreatic pseudocyst.   FAMILY HISTORY: Father had gout and high blood pressure, and mother had hypertension.   ALLERGIES: ULTRAM.   HOME MEDICATIONS: 1.  Oxycodone 10 mg p.o. every 4 hours.  2.  Naproxen 500 mg p.o. b.i.d. 3.  Methocarbamol 750 mg 2 tablets 3 times a day.  4.  Lantus 40 units subcutaneous once a day at bedtime.  5.  Acetaminophen and oxycodone 325/5 mg p.o. tablets every 4 hours p.r.n.  REVIEW OF  SYSTEMS:  CONSTITUTIONAL: The patient denies any fever or chills. No headache or dizziness. No weakness.  EYES: No double vision or blurry vision.   ENT: No postnasal drip, slurred speech, or dysphagia.  CARDIOVASCULAR: No chest pain, palpitation, orthopnea, or nocturnal dyspnea. No leg edema.  PULMONARY: No cough, sputum, shortness of breath, or hemoptysis.  GASTROINTESTINAL: No abdominal pain, nausea, vomiting, diarrhea. No melena or bloody stool.  GENITOURINARY: No dysuria, hematuria, or incontinence.  SKIN: No rash or jaundice. NEUROLOGY: No syncope, loss of consciousness, or seizure.  ENDOCRINOLOGY: No polyuria, polydipsia, heat or cold intolerance.  GENITOURINARY: No dysuria, hematuria, or incontinence, but has scrotal swelling and tenderness and discharge.   PHYSICAL EXAMINATION:  VITAL SIGNS: Temperature 97.9, blood pressure 142/87, pulse 83, oxygen saturation 100% on room air.  GENERAL: The patient is alert, awake, oriented, in no acute distress.  HEENT: Pupils round, equal, reactive to light and accommodation. Moist oral mucosa. Clear oropharynx.  NECK: Supple. No JVD or carotid bruits. No lymphadenopathy. No thyromegaly.  CARDIOVASCULAR: S1, S2. Regular rate, rhythm. No murmurs or gallops.  PULMONARY: Bilateral air entry. No wheezing or rales. No use of accessory muscles to breathe.  ABDOMEN: Soft. No distention or tenderness. No organomegaly. Bowel sounds present.  EXTREMITIES: No edema, clubbing, or cyanosis. No calf tenderness. Bilateral pedal pulses present.  NEUROLOGIC: A and O x 3. No focal deficit. Power 5/5. Sensory intact. GENITOURINARY: The patient has swelling and tenderness of scrotum with possible abscess and some discharge.  LABORATORY DATA: WBC 5.9, hemoglobin 12.8, platelets 372,000. Glucose 584, BUN 80, creatinine 0.86, sodium 135, potassium 4.2, chloride 100.  Ultrasound of the testicle showed significant sonographic worsening of right epididymitis since the  previous study with increased prominence and edema of entire epididymis as well as overlying scrotal skin thickening. No discrete focal abscess is identified. However, phlegmonous changes  are noted surrounding the epididymis. There is also mass effect on the right testicle from the enlarged epididymis.   IMPRESSIONS:  1.  Scrotal cellulitis.  2.  Epididymitis. 3.  Hyperglycemia.  4.  Uncontrolled diabetes.  5.  Tobacco abuse.  6.  History of alcohol use pancreatitis and alcoholic liver disease.   PLAN OF TREATMENT:  1.  The patient will be admitted to medical floor. We will start Zosyn and follow up with urologist for possible procedure.  2.  For hyperglycemia and the uncontrolled diabetes, we will start sliding scale with Lantus 40 units subcutaneous at bedtime. Check hemoglobin A1c.  3.  Give IV fluid support with normal saline. 4.   Pain control.  5.  For tobacco abuse, smoking cessation was counseled for 4 minutes. We will give a nicotine patch.   I discussed the patient's condition and the plan of treatment with the patient.   CODE STATUS: The patient wants full code.   TIME SPENT: About 52 minutes.    ____________________________ Demetrios Loll, MD qc:ST D: 08/22/2014 01:53:00 ET T: 08/22/2014 03:09:30 ET JOB#: [id number]  cc: Demetrios Loll, MD, <Dictator> Demetrios Loll MD ELECTRONICALLY SIGNED 08/24/2014 17:27

## 2014-10-29 NOTE — Discharge Summary (Signed)
PATIENT NAME:  [name], [name] MR#:  [id number] DATE OF BIRTH:  July 07, 1960  DATE OF ADMISSION:  08/21/2014 DATE OF DISCHARGE:  08/25/2014  DISCHARGE DIAGNOSES:  1. Scrotal abscess status post drainage. 2. Dry gangrene of scrotum. 3. Epididymoorchitis. 4. Diabetes mellitus type 2, despite poorly controlled. History. 5. Of alcohol abuse with alcoholic pancreatitis.   DISCHARGE MEDICATION: Methocarbamol 750 mg 2 tablets p.o. 3 times a day, Naprosyn 500 mg p.o. b.i.d., Percocet 5/325 one 1 tablet every 4 hours as needed for pain, Lantus 45 units at bedtime, NovoLog 8 units 3 times a day with meals along with correction, Augmentin 875 /125 mg  p.o. b.i.d. for 10 days   CONSULTATIONS:  1. ID consult, Dr. Ola Spurr. 2. Urology consult, Dr. Hollice Espy. 3. The patient also was seen by inpatient glycemic control nurse.   HOSPITAL COURSE: The patient is a 54 year old African-American male with history of diabetes, noncompliance with medications, alcohol abuse, tobacco abuse who comes in because of: 1. Right scrotal swelling and also pain and discharge. The patient was seen at [facility] on February 16. The patient was given multiple rounds of antibiotics with Cipro, Bactrim and doxycycline. The patient continued to have drainage and also swelling of the scrotum, so he was admitted for right scrotal cellulitis and epididymoorchitis. He was started on vancomycin, Zosyn and clindamycin and started on IV fluids as well. Dr. Erlene Quan has seen the patient. She took him to the operating room on the 23rd for abscess drainage from his scrotum. The patient also was found to have dry gangrene and at the same time the wound was washed out. The patient was sent back to the room. The patient did not have to have either testicle removed, but testicles are swollen. After the operation, the patient remained asymptomatic. His drainage has decreased and the patient also had a nice granulated wound beds. He was  seen by urology on a regular basis. The patient remained afebrile with  no white count. The patient was seen by Dr. Erlene Quan and the nurse practitioner on a regular basis. The wound was evaluated and the wound bed looked granulated and pink and did not have further signs of infection, did not have to have any further debridement. The patient was seen by Dr. Ola Spurr for antibiotic selection. The patient was seen by Dr. Ola Spurr and he said the patient can take Augmentin for 10 days and if he needs to have further doses of antibiotics. The patient's wound cultures were coagulase-negative staph. The patient's HIV test has been negative. We gave him a prescription for Augmentin for 10 days. I have also advised him to follow up with Dr. Erlene Quan in about a week for a wound check.  2. Type 2 diabetes mellitus with uncontrolled sugars. His sugars were more than 500 on admission with no DKA. Hemoglobin A1c was 13. He was started on IV fluids along with insulin sliding scale and also Lantus. The patient also seen by inpatient glycemic nurse as well. The patient has very poor compliance with medications, so we advised him  that he needs to be compliant with medication, especially in the context of significant infection and cellulitis. The patient understands that and he said he will have no problem with medication buying. He continued to get Lantus and NovoLog. At this time, his sugars are well controlled on Lantus 45 units at bedtime and also NovoLog 8 units 3 times a day with meals. The patient's blood glucose at the time of discharge  is 81.  3. Regarding wound care. We advised him that he can have a nurse to check his wounds and changed dressings, but he refused.   LABORATORY DATA: The patient's lab data during the hospital stay: Hemoglobin A1c 13.3. HIV test has been negative. Blood cultures have been negative. The patient's electrolytes on admission: Sodium 135, potassium 4.2, chloride 100, bicarbonate 28, BUN 8,  creatinine 0.8, glucose 584. The patient's WBC 5.9, hemoglobin 12.8, hematocrit 9.2, platelets 272,000.   PHYSICAL EXAMINATION:  VITAL SIGNS: At the time of discharge, temperature 98 Fahrenheit, heart 72, blood pressure 128/81, saturations 98% on room air. CARDIOVASCULAR: S1, S2. Regular. LUNGS: Clear to auscultation.  GENITOURINARY: Scrotal exam shows mild edema. Wound bed is pink with minimal granulation. The patient's right testicle was enlarged but appears to be viable.   DISPOSITION:  Discharged home in stable condition.    TIME SPENT: More than 30 minutes.    ____________________________ Epifanio Lesches, MD sk:TT D: 08/26/2014 07:59:19 ET T: 08/26/2014 13:38:10 ET JOB#: [id number]  cc: Epifanio Lesches, MD, <Dictator> Cheral Marker. Ola Spurr, MD Sherlynn Stalls, MD Epifanio Lesches MD ELECTRONICALLY SIGNED 09/04/2014 15:31

## 2014-10-29 NOTE — Consult Note (Signed)
Chief Complaint:  Subjective/Chief Complaint No issues overnight, pain minimal.  No drainage from wound, fevers or chills.   VITAL SIGNS/ANCILLARY NOTES: **Vital Signs.:   26-Feb-16 05:32  Vital Signs Type Q 8hr  Temperature Temperature (F) 97.7  Celsius 36.5  Temperature Source oral  Pulse Pulse 67  Respirations Respirations 18  Systolic BP Systolic BP 295  Diastolic BP (mmHg) Diastolic BP (mmHg) 81  Mean BP 100  Pulse Ox % Pulse Ox % 100  Pulse Ox Activity Level  At rest  Oxygen Delivery Room Air/ 21 %  *Intake and Output.:   Daily 26-Feb-16 07:00  Grand Totals Intake:  1484 Output:  575    Net:  909 24 Hr.:  909  Oral Intake      In:  360  IV (Primary)      In:  974  IV (Secondary)      In:  150  Urine ml     Out:  575  Length of Stay Totals Intake:  4757 Output:  2605    Net:  2152   Brief Assessment:  GEN well developed, well nourished, no acute distress   Cardiac Regular   Respiratory normal resp effort  clear BS   Gastrointestinal Normal   Gastrointestinal details normal Soft  Nontender  Nondistended   EXTR negative cyanosis/clubbing, negative edema   Additional Physical Exam Scrotum with mild edema, wound with wet to dry dressing.  Wound bed pink wtih minimal granulation tissue. Skin wound edges intact, viable appearing.  No drainage.  Right testicle enlarged, firm, but continues appear viable.   Assessment/Plan:  Assessment/Plan:  Assessment 54 yo poorly controlled diabetic male POD 3 s/p scrotal debrisment/ exploration.  Wound stable, no evideince of residual nectrosis, no drainage.  Right testicle viable but enlarged/ infected.  No plan for further debridement in OR.   Plan 1. Continue IV abx while inpatient, trasition on PO abx per ID 2. Wet to dry dressing changes bid, arrange for home health 3. Tight glucose control for wound healing 4. Wound cultures showing no bacterial growth. Recommend consultation with Infectious Disease concerning  discharge antibiotics as patient failed Doxycycline, Bactrim, Cipro and Flagyl as outpatient.  History of trichomonas. 5. Patient stable for discharge per Urology, recommend follow up next week for wound check   Please call with any questions or concerns   Electronic Signatures: Sherlynn Stalls (MD)  (Signed 26-Feb-16 07:51)  Authored: Chief Complaint, VITAL SIGNS/ANCILLARY NOTES, Brief Assessment, Assessment/Plan   Last Updated: 26-Feb-16 07:51 by Sherlynn Stalls (MD)

## 2015-03-07 ENCOUNTER — Encounter: Payer: Self-pay | Admitting: *Deleted

## 2015-03-12 ENCOUNTER — Encounter: Payer: Medicaid Other | Admitting: Urology

## 2015-03-12 ENCOUNTER — Encounter: Payer: Self-pay | Admitting: Urology

## 2015-03-12 NOTE — Progress Notes (Signed)
This encounter was created in error - please disregard.

## 2015-05-12 ENCOUNTER — Emergency Department: Payer: Medicaid Other

## 2015-05-12 ENCOUNTER — Inpatient Hospital Stay
Admission: EM | Admit: 2015-05-12 | Discharge: 2015-05-14 | DRG: 379 | Disposition: A | Discharge status: Home / Self Care | Payer: Medicaid Other | Attending: Internal Medicine | Admitting: Internal Medicine

## 2015-05-12 DIAGNOSIS — N390 Urinary tract infection, site not specified: Secondary | ICD-10-CM | POA: Diagnosis not present

## 2015-05-12 DIAGNOSIS — K922 Gastrointestinal hemorrhage, unspecified: Secondary | ICD-10-CM | POA: Diagnosis present

## 2015-05-12 DIAGNOSIS — R5383 Other fatigue: Secondary | ICD-10-CM | POA: Diagnosis present

## 2015-05-12 DIAGNOSIS — K219 Gastro-esophageal reflux disease without esophagitis: Secondary | ICD-10-CM | POA: Diagnosis present

## 2015-05-12 DIAGNOSIS — F17200 Nicotine dependence, unspecified, uncomplicated: Secondary | ICD-10-CM | POA: Diagnosis present

## 2015-05-12 DIAGNOSIS — N4283 Cyst of prostate: Secondary | ICD-10-CM | POA: Diagnosis present

## 2015-05-12 DIAGNOSIS — F1010 Alcohol abuse, uncomplicated: Secondary | ICD-10-CM | POA: Diagnosis present

## 2015-05-12 DIAGNOSIS — Z79899 Other long term (current) drug therapy: Secondary | ICD-10-CM

## 2015-05-12 DIAGNOSIS — Z794 Long term (current) use of insulin: Secondary | ICD-10-CM | POA: Diagnosis not present

## 2015-05-12 DIAGNOSIS — N400 Enlarged prostate without lower urinary tract symptoms: Secondary | ICD-10-CM | POA: Diagnosis present

## 2015-05-12 DIAGNOSIS — E119 Type 2 diabetes mellitus without complications: Secondary | ICD-10-CM | POA: Diagnosis present

## 2015-05-12 DIAGNOSIS — R109 Unspecified abdominal pain: Secondary | ICD-10-CM | POA: Diagnosis not present

## 2015-05-12 DIAGNOSIS — Z90410 Acquired total absence of pancreas: Secondary | ICD-10-CM

## 2015-05-12 DIAGNOSIS — Z809 Family history of malignant neoplasm, unspecified: Secondary | ICD-10-CM | POA: Diagnosis not present

## 2015-05-12 DIAGNOSIS — R1084 Generalized abdominal pain: Secondary | ICD-10-CM | POA: Diagnosis not present

## 2015-05-12 HISTORY — DX: Cyst of prostate: N42.83

## 2015-05-12 LAB — CBC
HEMATOCRIT: 41.1 % (ref 40.0–52.0)
Hemoglobin: 13.8 g/dL (ref 13.0–18.0)
MCH: 31 pg (ref 26.0–34.0)
MCHC: 33.5 g/dL (ref 32.0–36.0)
MCV: 92.5 fL (ref 80.0–100.0)
PLATELETS: 131 10*3/uL — AB (ref 150–440)
RBC: 4.45 MIL/uL (ref 4.40–5.90)
RDW: 13.6 % (ref 11.5–14.5)
WBC: 7 10*3/uL (ref 3.8–10.6)

## 2015-05-12 LAB — COMPREHENSIVE METABOLIC PANEL
ALBUMIN: 4.6 g/dL (ref 3.5–5.0)
ALT: 49 U/L (ref 17–63)
AST: 43 U/L — AB (ref 15–41)
Alkaline Phosphatase: 61 U/L (ref 38–126)
Anion gap: 10 (ref 5–15)
BILIRUBIN TOTAL: 0.5 mg/dL (ref 0.3–1.2)
BUN: 7 mg/dL (ref 6–20)
CO2: 20 mmol/L — ABNORMAL LOW (ref 22–32)
CREATININE: 0.65 mg/dL (ref 0.61–1.24)
Calcium: 9.2 mg/dL (ref 8.9–10.3)
Chloride: 103 mmol/L (ref 101–111)
GFR calc Af Amer: >60 mL/min (ref >60)
GLUCOSE: 356 mg/dL — AB (ref 65–99)
POTASSIUM: 3.3 mmol/L — AB (ref 3.5–5.1)
Sodium: 133 mmol/L — ABNORMAL LOW (ref 135–145)
TOTAL PROTEIN: 7.8 g/dL (ref 6.5–8.1)

## 2015-05-12 LAB — ABO/RH: ABO/RH(D): A POS

## 2015-05-12 LAB — ETHANOL: Alcohol, Ethyl (B): 264 mg/dL — ABNORMAL HIGH (ref <5)

## 2015-05-12 LAB — TYPE AND SCREEN
ABO/RH(D): A POS
Antibody Screen: NEGATIVE

## 2015-05-12 MED ORDER — SODIUM CHLORIDE 0.9 % IV SOLN
INTRAVENOUS | Status: AC
  Administered 2015-05-12: via INTRAVENOUS

## 2015-05-12 MED ORDER — IOHEXOL 240 MG/ML SOLN
25.0000 mL | Freq: Once | INTRAMUSCULAR | Status: AC | PRN
  Administered 2015-05-12: 25 mL via ORAL

## 2015-05-12 MED ORDER — PANTOPRAZOLE SODIUM 40 MG IV SOLR: 40.0000 mg | Freq: Two times a day (BID) | INTRAVENOUS | Status: DC

## 2015-05-12 MED ORDER — SODIUM CHLORIDE 0.9 % IV SOLN
80.0000 mg | Freq: Once | INTRAVENOUS | Status: AC
  Administered 2015-05-12: 80 mg via INTRAVENOUS
  Filled 2015-05-12 (×2): qty 80

## 2015-05-12 MED ORDER — IOHEXOL 300 MG/ML  SOLN
100.0000 mL | Freq: Once | INTRAMUSCULAR | Status: AC | PRN
  Administered 2015-05-12: 100 mL via INTRAVENOUS

## 2015-05-12 MED ORDER — ONDANSETRON HCL 4 MG/2ML IJ SOLN: 4.0000 mg | Freq: Four times a day (QID) | INTRAMUSCULAR | Status: DC | PRN

## 2015-05-12 MED ORDER — ADULT MULTIVITAMIN W/MINERALS CH
1.0000 | ORAL_TABLET | Freq: Every day | ORAL | Status: DC
  Administered 2015-05-14: 1 via ORAL
  Filled 2015-05-12 (×2): qty 1

## 2015-05-12 MED ORDER — ACETAMINOPHEN 325 MG PO TABS
650.0000 mg | ORAL_TABLET | Freq: Four times a day (QID) | ORAL | Status: DC | PRN
  Filled 2015-05-12: qty 2

## 2015-05-12 MED ORDER — ACETAMINOPHEN 650 MG RE SUPP
650.0000 mg | Freq: Four times a day (QID) | RECTAL | Status: DC | PRN
Start: 2015-05-12 — End: 2015-05-14

## 2015-05-12 MED ORDER — SODIUM CHLORIDE 0.9 % IJ SOLN
3.0000 mL | Freq: Two times a day (BID) | INTRAMUSCULAR | Status: DC
  Administered 2015-05-13 (×2): 3 mL via INTRAVENOUS

## 2015-05-12 MED ORDER — LORAZEPAM 1 MG PO TABS
1.0000 mg | ORAL_TABLET | Freq: Four times a day (QID) | ORAL | Status: DC | PRN
  Administered 2015-05-12: 1 mg via ORAL
  Filled 2015-05-12: qty 1

## 2015-05-12 MED ORDER — FOLIC ACID 1 MG PO TABS
1.0000 mg | ORAL_TABLET | Freq: Every day | ORAL | Status: DC
  Administered 2015-05-14: 1 mg via ORAL
  Filled 2015-05-12 (×2): qty 1

## 2015-05-12 MED ORDER — LORAZEPAM 2 MG/ML IJ SOLN: 0.0000 mg | Freq: Two times a day (BID) | INTRAMUSCULAR | Status: DC

## 2015-05-12 MED ORDER — ONDANSETRON HCL 4 MG PO TABS: 4.0000 mg | ORAL_TABLET | Freq: Four times a day (QID) | ORAL | Status: DC | PRN

## 2015-05-12 MED ORDER — LORAZEPAM 2 MG/ML IJ SOLN
0.0000 mg | Freq: Four times a day (QID) | INTRAMUSCULAR | Status: DC
  Administered 2015-05-13: 1 mg via INTRAVENOUS
  Filled 2015-05-12 (×2): qty 1

## 2015-05-12 MED ORDER — THIAMINE HCL 100 MG/ML IJ SOLN: 100.0000 mg | Freq: Every day | INTRAMUSCULAR | Status: DC

## 2015-05-12 MED ORDER — MORPHINE SULFATE (PF) 4 MG/ML IV SOLN
4.0000 mg | INTRAVENOUS | Status: DC | PRN
  Administered 2015-05-13 (×5): 4 mg via INTRAVENOUS
  Filled 2015-05-12 (×5): qty 1

## 2015-05-12 MED ORDER — LORAZEPAM 2 MG/ML IJ SOLN: 1.0000 mg | Freq: Four times a day (QID) | INTRAMUSCULAR | Status: DC | PRN

## 2015-05-12 MED ORDER — MORPHINE SULFATE (PF) 4 MG/ML IV SOLN
4.0000 mg | Freq: Once | INTRAVENOUS | Status: AC
  Administered 2015-05-12: 4 mg via INTRAVENOUS
  Filled 2015-05-12: qty 1

## 2015-05-12 MED ORDER — VITAMIN B-1 100 MG PO TABS
100.0000 mg | ORAL_TABLET | Freq: Every day | ORAL | Status: DC
  Administered 2015-05-14: 100 mg via ORAL
  Filled 2015-05-12 (×2): qty 1

## 2015-05-12 MED ORDER — SODIUM CHLORIDE 0.9 % IV SOLN
8.0000 mg/h | INTRAVENOUS | Status: DC
  Administered 2015-05-12 – 2015-05-14 (×2): 8 mg/h via INTRAVENOUS
  Filled 2015-05-12 (×2): qty 80

## 2015-05-12 MED ORDER — INSULIN ASPART 100 UNIT/ML ~~LOC~~ SOLN: 0.0000 [IU] | Freq: Four times a day (QID) | SUBCUTANEOUS | Status: DC | PRN

## 2015-05-12 NOTE — H&P (Signed)
[facility] [place] NAME: [name]    MR#:  [id number]  DATE OF BIRTH:  1960/11/01  DATE OF ADMISSION:  05/12/2015  PRIMARY CARE PHYSICIAN: [name], MD   REQUESTING/REFERRING PHYSICIAN: Kerman Passey, M.D.  CHIEF COMPLAINT:   Chief Complaint  Patient presents with  . Rectal Bleeding    HISTORY OF PRESENT ILLNESS:  [name]  is a 54 y.o. male who presents with abdominal pain and rectal bleeding. Patient states that he has had some intermittent bleeding mixed with stool for the past 1-2 weeks. He also states that his abdominal pain has been intermittent for about the same time period. His pain is left upper quadrant and epigastric. He states that last week he had some episodes of vomiting and also vomited up some blood. However, today his pain grew intensely worse and he began having grossly bloody stools. At that point he came to the ED for evaluation. In the ED he was found to have stable labs, specifically including a stable hemoglobin. However, his rectal exam was grossly bloody. Patient is also known alcohol user, and had a mildly elevated blood alcohol level. Hospitalists were called for admission for GI bleed.  PAST MEDICAL HISTORY:   Past Medical History  Diagnosis Date  . Epididymoorchitis   . Diabetes ([place])   . Cellulitis of scrotum   . Difficulty urinating   . BPH without obstruction/lower urinary tract symptoms   . Trichomoniasis     PAST SURGICAL HISTORY:   Past Surgical History  Procedure Laterality Date  . Resection of pancreas      SOCIAL HISTORY:   Social History  Substance Use Topics  . Smoking status: Current Every Day Smoker  . Smokeless tobacco: Not on file  . Alcohol Use: 0.0 oz/week    0 Standard drinks or equivalent per week     Comment: today 05/12/15    FAMILY HISTORY:   Family History  Problem Relation Age of Onset  . Cancer Father     DRUG ALLERGIES:  No Known  Allergies  MEDICATIONS AT HOME:   Prior to Admission medications   Medication Sig Start Date End Date Taking? Authorizing Provider  EVZIO 0.4 MG/0.4ML SOAJ Inject 0.4 mLs as directed daily as needed. For overdose. 05/07/15  Yes Historical Provider, MD  LANTUS SOLOSTAR 100 UNIT/ML Solostar Pen Inject 10 Units into the skin 2 (two) times daily.  01/15/15  Yes Historical Provider, MD  Oxycodone HCl 10 MG TABS Take 10 mg by mouth every 8 (eight) hours as needed (for pain.).  02/10/15  Yes Historical Provider, MD    REVIEW OF SYSTEMS:  Review of Systems  Constitutional: Negative for fever, chills, weight loss and malaise/fatigue.  HENT: Negative for ear pain, hearing loss and tinnitus.   Eyes: Negative for blurred vision, double vision, pain and redness.  Respiratory: Negative for cough, hemoptysis and shortness of breath.   Cardiovascular: Negative for chest pain, palpitations, orthopnea and leg swelling.  Gastrointestinal: Positive for nausea, vomiting, abdominal pain, diarrhea and blood in stool. Negative for constipation.  Genitourinary: Negative for dysuria, frequency and hematuria.  Musculoskeletal: Negative for back pain, joint pain and neck pain.  Skin:       No acne, rash, or lesions  Neurological: Negative for dizziness, tremors, focal weakness and weakness.  Endo/Heme/Allergies: Negative for polydipsia. Does not bruise/bleed easily.  Psychiatric/Behavioral: Negative for depression. The patient is not nervous/anxious and does not have insomnia.  VITAL SIGNS:   Filed Vitals:   05/12/15 1900 05/12/15 1930 05/12/15 2000 05/12/15 2100  BP: 112/72 124/80 124/89 134/88  Pulse: 84 82 83 83  Temp:      TempSrc:      Resp: 16 14 23 18   Height:      Weight:      SpO2: 97% 97% 98% 99%   Wt Readings from Last 3 Encounters:  05/12/15 63.504 kg (140 lb)  03/12/15 64.32 kg (141 lb 12.8 oz)    PHYSICAL EXAMINATION:  Physical Exam  Vitals reviewed. Constitutional: He is oriented  to person, place, and time. He appears well-developed and well-nourished. No distress.  HENT:  Head: Normocephalic and atraumatic.  Mouth/Throat: Oropharynx is clear and moist.  Eyes: Conjunctivae and EOM are normal. Pupils are equal, round, and reactive to light. No scleral icterus.  Neck: Normal range of motion. Neck supple. No JVD present. No thyromegaly present.  Cardiovascular: Normal rate, regular rhythm and intact distal pulses.  Exam reveals no gallop and no friction rub.   No murmur heard. Respiratory: Effort normal and breath sounds normal. No respiratory distress. He has no wheezes. He has no rales.  GI: Soft. Bowel sounds are normal. He exhibits no distension. There is tenderness (significant tenderness left upper quadrant and epigastric region).  Musculoskeletal: Normal range of motion. He exhibits no edema.  No arthritis, no gout  Lymphadenopathy:    He has no cervical adenopathy.  Neurological: He is alert and oriented to person, place, and time. No cranial nerve deficit.  No dysarthria, no aphasia  Skin: Skin is warm and dry. No rash noted. No erythema.  Psychiatric: He has a normal mood and affect. His behavior is normal. Judgment and thought content normal.    LABORATORY PANEL:   CBC  Recent Labs Lab 05/12/15 1732  WBC 7.0  HGB 13.8  HCT 41.1  PLT 131*   ------------------------------------------------------------------------------------------------------------------  Chemistries   Recent Labs Lab 05/12/15 1732  NA 133*  K 3.3*  CL 103  CO2 20*  GLUCOSE 356*  BUN 7  CREATININE 0.65  CALCIUM 9.2  AST 43*  ALT 49  ALKPHOS 61  BILITOT 0.5   ------------------------------------------------------------------------------------------------------------------  Cardiac Enzymes No results for input(s): TROPONINI in the last 168  hours. ------------------------------------------------------------------------------------------------------------------  RADIOLOGY:  Ct Abdomen Pelvis W Contrast  05/12/2015  CLINICAL DATA:  Subacute onset of left lower quadrant abdominal pain for 2 weeks. Bloody diarrhea. Initial encounter. EXAM: CT ABDOMEN AND PELVIS WITH CONTRAST TECHNIQUE: Multidetector CT imaging of the abdomen and pelvis was performed using the standard protocol following bolus administration of intravenous contrast. CONTRAST:  158mL OMNIPAQUE IOHEXOL 300 MG/ML  SOLN COMPARISON:  CT of the abdomen and pelvis from 09/10/2012, and MRI of the lumbar spine performed 08/08/2013 FINDINGS: The visualized lung bases are clear. Chronic left-sided rib deformities are noted. There is marked diffuse fatty infiltration within the liver. A 1.3 cm focus of contrast blush within the posterior right hepatic lobe is grossly unchanged from 2014 and likely benign. Mild focal fatty sparing is noted about the gallbladder fossa. The spleen is unremarkable in appearance. The gallbladder is within normal limits. Diffuse calcification within the pancreas reflects sequelae of chronic pancreatitis. The adrenal glands are unremarkable in appearance. A few small nodes adjacent to the gastroesophageal junction likely remain within normal limits. The kidneys are unremarkable in appearance. There is no evidence of hydronephrosis. No renal or ureteral stones are seen. No perinephric stranding is appreciated. No free fluid is  identified. The small bowel is unremarkable in appearance. The stomach is within normal limits. No acute vascular abnormalities are seen. Mild calcification is noted along the abdominal aorta and its branches. The appendix is normal in caliber and contains air, without evidence of appendicitis. Scattered diverticulosis is noted along the ascending and descending colon, without evidence of diverticulitis. The bladder is mildly distended. Apparent  bladder wall thickening could reflect mild cystitis, depending on the patient's symptoms. An unusual 2.7 x 1.8 cm collection of fluid is noted within the right side of the prostate gland. Would correlate for history of prior surgery, to exclude an intraprostatic abscess. No inguinal lymphadenopathy is seen. No acute osseous abnormalities are identified. IMPRESSION: 1. Apparent bladder wall thickening could reflect mild cystitis, depending on the patient's symptoms. 2. Unusual new 2.7 x 1.8 cm collection of fluid noted within the right side of the prostate gland. Would correlate for history of prior surgery, to exclude an intraprostatic abscess. 3. Marked diffuse fatty infiltration within the liver. 1.3 cm focus of contrast blush at the posterior right hepatic lobe is stable from 2014 and likely benign. 4. Diffuse calcification within the pancreas reflects sequelae of chronic pancreatitis. 5. Mild calcification along the abdominal aorta and its branches. 6. Scattered diverticulosis along the ascending and descending colon, without evidence of diverticulitis. Electronically Signed   By: Garald Balding M.D.   On: 05/12/2015 20:31    EKG:   Orders placed or performed in visit on 09/10/12  . EKG 12-Lead    IMPRESSION AND PLAN:  Principal Problem:   GI bleed - suspect upper GI bleed. We'll start on Protonix drip, keep nothing by mouth, monitor serial hemoglobin, and consult gastroenterology. Active Problems:   Type 2 diabetes mellitus (HCC) - sliding scale insulin with appropriate fingerstick glucose monitoring every 6 hours while nothing by mouth, then when taking by mouth he will need carb modified diet with before meals at bedtime sliding scale insulin and corresponding glucose checks. We'll also check a hemoglobin A1c.   GERD (gastroesophageal reflux disease) - pantoprazole drip as above.   ETOH abuse - CIWA protocol, banana bag   BPH (benign prostatic hyperplasia) - abnormal imaging of his prostate on  CT abdomen and pelvis. Question of possible prostate abscess? [facility] consult urology and appreciate their input and recommendations.  All the records are reviewed and case discussed with ED provider. Management plans discussed with the patient and/or family.  DVT PROPHYLAXIS: mechanical only  ADMISSION STATUS: Inpatient  CODE STATUS: Full  TOTAL TIME TAKING CARE OF THIS PATIENT: 40 minutes.    [name] 05/12/2015, 9:46 PM  [facility]  Office  [phone]  CC: Primary care physician; [name], MD

## 2015-05-12 NOTE — ED Notes (Signed)
Pt c/o loose stool for the past 3 weeks, bloody stools for the past week with abd pain..vomiting last night

## 2015-05-12 NOTE — ED Provider Notes (Signed)
[facility] Emergency Department Provider Note  Time seen: 6:11 PM  I have reviewed the triage vital signs and the nursing notes.   HISTORY  Chief Complaint Rectal Bleeding    HPI [name] is a 54 y.o. male with a past medical history of diabetes, alcohol abuse who presents the emergency department for bloody stool. According to the patient for the past 3 weeks he has had loose stool. He states today he noticed blood in his stool so he came to the emergency department for evaluation. He also states intermittent abdominal pain for the past several weeks worse today. Intermittent nausea and vomiting for the past several weeks however denies any black or bloody vomit. Denies fever. Admits to drinking alcohol today.     Past Medical History  Diagnosis Date  . Epididymoorchitis   . Diabetes ([place])   . Cellulitis of scrotum   . Difficulty urinating   . BPH without obstruction/lower urinary tract symptoms   . Trichomoniasis     There are no active problems to display for this patient.   Past Surgical History  Procedure Laterality Date  . Resection of pancreas      Current Outpatient Rx  Name  Route  Sig  Dispense  Refill  . cetirizine (ZYRTEC) 10 MG tablet            1   . LANTUS SOLOSTAR 100 UNIT/ML Solostar Pen            1     Dispense as written.   Marland Kitchen NOVOLOG FLEXPEN 100 UNIT/ML FlexPen      INJECT 10 UNITS UNDER THE SKIN 3 TIMES DAILY WITH MEALS      1     Dispense as written.   . Oxycodone HCl 10 MG TABS   Oral   Take 10 mg by mouth every 8 (eight) hours.      0     Allergies Review of patient's allergies indicates no known allergies.  Family History  Problem Relation Age of Onset  . Cancer Father     Social History Social History  Substance Use Topics  . Smoking status: Current Every Day Smoker  . Smokeless tobacco: None  . Alcohol Use: 0.0 oz/week    0 Standard drinks or equivalent per week     Comment:  today 05/12/15    Review of Systems Constitutional: Negative for fever. Cardiovascular: Negative for chest pain. Respiratory: Negative for shortness of breath. Gastrointestinal: Positive for diffuse abdominal pain. Positive for nausea and vomiting. Positive for loose stool with blood. Genitourinary: Negative for dysuria Neurological: Negative for headache 10-point ROS otherwise negative.  ____________________________________________   PHYSICAL EXAM:  VITAL SIGNS: ED Triage Vitals  Enc Vitals Group     BP 05/12/15 1729 98/64 mmHg     Pulse Rate 05/12/15 1729 88     Resp 05/12/15 1729 16     Temp 05/12/15 1729 97.8 F (36.6 C)     Temp Source 05/12/15 1729 Oral     SpO2 05/12/15 1729 97 %     Weight 05/12/15 1729 140 lb (63.504 kg)     Height 05/12/15 1729 5\' 8"  (1.727 m)     Head Cir --      Peak Flow --      Pain Score 05/12/15 1728 9     Pain Loc --      Pain Edu? --      Excl. in [place]? --  Constitutional: Alert and oriented. Well appearing and in no distress. Eyes: Normal exam ENT   Head: Normocephalic and atraumatic.   Mouth/Throat: Mucous membranes are moist. Cardiovascular: Normal rate, regular rhythm. No murmur Respiratory: Normal respiratory effort without tachypnea nor retractions. Breath sounds are clear and equal bilaterally. No wheezes/rales/rhonchi. Gastrointestinal: Soft, moderate diffuse abdominal tenderness palpation. No rebound or guarding. Rectal exam shows moderate hemorrhoids, gross blood on exam strongly guaiac positive. Musculoskeletal: Nontender with normal range of motion in all extremities.  Neurologic:  Normal speech and language. No gross focal neurologic deficits  Skin:  Skin is warm, dry and intact.  Psychiatric: Mood and affect are normal. Speech and behavior are normal.  ____________________________________________     RADIOLOGY  CT shows possible prostate fluid collection, otherwise no acute  abnormality.  ____________________________________________    INITIAL IMPRESSION / ASSESSMENT AND PLAN / ED COURSE  Pertinent labs & imaging results that were available during my care of the patient were reviewed by me and considered in my medical decision making (see chart for details).  Patient presents the emergency department with 3 weeks of diarrhea. Blood in his stool today. Patient admits to drinking alcohol today. Admits chronic alcoholism. Patient has diffuse abdominal tenderness to palpation. No rebound or guarding. We'll obtain a CT abdomen/pelvis. Patient's rectal exam shows gross blood strongly guaiac positive. Plan to admit to the hospital for lower GI bleed.  CT shows possible prostate fluid collection/abscess, patient's tenderness appears to be more epigastric to left sided. However given gross blood on his rectal exam and history of alcoholism we will admit to the hospital for further evaluation and workup. ____________________________________________   FINAL CLINICAL IMPRESSION(S) / ED DIAGNOSES  Lower GI bleed Bowel movement   Harvest Dark, MD 05/12/15 2050

## 2015-05-13 ENCOUNTER — Encounter: Payer: Self-pay | Admitting: Urology

## 2015-05-13 DIAGNOSIS — N390 Urinary tract infection, site not specified: Secondary | ICD-10-CM

## 2015-05-13 DIAGNOSIS — N4283 Cyst of prostate: Secondary | ICD-10-CM

## 2015-05-13 DIAGNOSIS — R1084 Generalized abdominal pain: Secondary | ICD-10-CM

## 2015-05-13 HISTORY — DX: Cyst of prostate: N42.83

## 2015-05-13 LAB — CBC
HEMATOCRIT: 39.3 % — AB (ref 40.0–52.0)
HEMOGLOBIN: 13.3 g/dL (ref 13.0–18.0)
MCH: 31.3 pg (ref 26.0–34.0)
MCHC: 33.9 g/dL (ref 32.0–36.0)
MCV: 92.5 fL (ref 80.0–100.0)
Platelets: 123 10*3/uL — ABNORMAL LOW (ref 150–440)
RBC: 4.25 MIL/uL — AB (ref 4.40–5.90)
RDW: 13.7 % (ref 11.5–14.5)
WBC: 5.6 10*3/uL (ref 3.8–10.6)

## 2015-05-13 LAB — BASIC METABOLIC PANEL
ANION GAP: 11 (ref 5–15)
Anion gap: 9 (ref 5–15)
BUN: 6 mg/dL (ref 6–20)
BUN: 8 mg/dL (ref 6–20)
CALCIUM: 8.5 mg/dL — AB (ref 8.9–10.3)
CHLORIDE: 109 mmol/L (ref 101–111)
CO2: 21 mmol/L — ABNORMAL LOW (ref 22–32)
CO2: 22 mmol/L (ref 22–32)
CREATININE: 0.71 mg/dL (ref 0.61–1.24)
CREATININE: 0.63 mg/dL (ref 0.61–1.24)
Calcium: 9 mg/dL (ref 8.9–10.3)
Chloride: 106 mmol/L (ref 101–111)
Glucose, Bld: 268 mg/dL — ABNORMAL HIGH (ref 65–99)
Glucose, Bld: 502 mg/dL (ref 65–99)
POTASSIUM: 3.4 mmol/L — AB (ref 3.5–5.1)
Potassium: 3.6 mmol/L (ref 3.5–5.1)
SODIUM: 138 mmol/L (ref 135–145)
SODIUM: 140 mmol/L (ref 135–145)

## 2015-05-13 LAB — GLUCOSE, CAPILLARY
GLUCOSE-CAPILLARY: 458 mg/dL — AB (ref 65–99)
GLUCOSE-CAPILLARY: 500 mg/dL — AB (ref 65–99)
GLUCOSE-CAPILLARY: 142 mg/dL — AB (ref 65–99)
GLUCOSE-CAPILLARY: 150 mg/dL — AB (ref 65–99)
Glucose-Capillary: 232 mg/dL — ABNORMAL HIGH (ref 65–99)
Glucose-Capillary: 296 mg/dL — ABNORMAL HIGH (ref 65–99)

## 2015-05-13 LAB — HEMOGLOBIN A1C: HEMOGLOBIN A1C: 12.1 % — AB (ref 4.0–6.0)

## 2015-05-13 MED ORDER — INSULIN ASPART 100 UNIT/ML ~~LOC~~ SOLN
18.0000 [IU] | Freq: Once | SUBCUTANEOUS | Status: AC
  Administered 2015-05-13: 18 [IU] via SUBCUTANEOUS
  Filled 2015-05-13: qty 18

## 2015-05-13 MED ORDER — INSULIN GLARGINE 100 UNIT/ML ~~LOC~~ SOLN
10.0000 [IU] | Freq: Every day | SUBCUTANEOUS | Status: DC
  Administered 2015-05-13: 10 [IU] via SUBCUTANEOUS
  Filled 2015-05-13 (×2): qty 0.1

## 2015-05-13 MED ORDER — INSULIN ASPART 100 UNIT/ML ~~LOC~~ SOLN
0.0000 [IU] | Freq: Three times a day (TID) | SUBCUTANEOUS | Status: DC
  Administered 2015-05-13: 2 [IU] via SUBCUTANEOUS
  Administered 2015-05-14: 8 [IU] via SUBCUTANEOUS
  Filled 2015-05-13 (×2): qty 2
  Filled 2015-05-13: qty 6

## 2015-05-13 MED ORDER — INSULIN ASPART 100 UNIT/ML ~~LOC~~ SOLN: 0.0000 [IU] | Freq: Every day | SUBCUTANEOUS | Status: DC

## 2015-05-13 NOTE — Consult Note (Signed)
GI Inpatient Consult Note  Reason for Consult:   Attending Requesting Consult:  History of Present Illness: CALLUM KUBICEK is a 54 y.o. male with LUQ/epigastric pain associated with rectal bleeding. Heavy EtOH hx.  Had pancreatic cyst removed in the past.  No prior hx of PUD. No prior EGD or colonoscopy. Already feels better since admission. Was hoping to go home. Somewhat lethargic.  Past Medical History:  Past Medical History  Diagnosis Date  . Epididymoorchitis   . Diabetes ([place])   . Cellulitis of scrotum   . Difficulty urinating   . BPH without obstruction/lower urinary tract symptoms   . Trichomoniasis   . Cyst of prostate 05/13/2015    Problem List: Patient Active Problem List   Diagnosis Date Noted  . Cyst of prostate 05/13/2015  . GI bleed 05/12/2015  . Type 2 diabetes mellitus ([place]) 05/12/2015  . GERD (gastroesophageal reflux disease) 05/12/2015  . ETOH abuse 05/12/2015  . BPH (benign prostatic hyperplasia) 05/12/2015    Past Surgical History: Past Surgical History  Procedure Laterality Date  . Resection of pancreas    . Scrotal exploration      Allergies: No Known Allergies  Home Medications: Prescriptions prior to admission  Medication Sig Dispense Refill Last Dose  . EVZIO 0.4 MG/0.4ML SOAJ Inject 0.4 mLs as directed daily as needed. For overdose.  0 hav en't used  . LANTUS SOLOSTAR 100 UNIT/ML Solostar Pen Inject 10 Units into the skin 2 (two) times daily.   1 05/12/2015 at Unknown time  . Oxycodone HCl 10 MG TABS Take 10 mg by mouth every 8 (eight) hours as needed (for pain.).   0 05/12/2015 at Unknown time   Home medication reconciliation was completed with the patient.   Scheduled Inpatient Medications:   . folic acid  1 mg Oral Daily  . LORazepam  0-4 mg Intravenous Q6H   Followed by  . [START ON 05/14/2015] LORazepam  0-4 mg Intravenous Q12H  . multivitamin with minerals  1 tablet Oral Daily  . [START ON 05/16/2015] pantoprazole (PROTONIX) IV   40 mg Intravenous Q12H  . sodium chloride  3 mL Intravenous Q12H  . thiamine  100 mg Oral Daily   Or  . thiamine  100 mg Intravenous Daily    Continuous Inpatient Infusions:   . pantoprozole (PROTONIX) infusion 8 mg/hr (05/12/15 2153)    PRN Inpatient Medications:  acetaminophen **OR** acetaminophen, insulin aspart, LORazepam **OR** LORazepam, morphine injection, ondansetron **OR** ondansetron (ZOFRAN) IV  Family History: family history includes Cancer in his father.  The patient's family history is negative for inflammatory bowel disorders, GI malignancy, or solid organ transplantation.  Social History:   reports that he has been smoking.  He does not have any smokeless tobacco history on file. He reports that he drinks alcohol. He reports that he does not use illicit drugs. The patient denies ETOH, tobacco, or drug use.   Review of Systems: Constitutional: Weight is stable.  Eyes: No changes in vision. ENT: No oral lesions, sore throat.  GI: see HPI.  Heme/Lymph: No easy bruising.  CV: No chest pain.  GU: No hematuria.  Integumentary: No rashes.  Neuro: No headaches.  Psych: No depression/anxiety.  Endocrine: No heat/cold intolerance.  Allergic/Immunologic: No urticaria.  Resp: No cough, SOB.  Musculoskeletal: No joint swelling.    Physical Examination: BP 141/74 mmHg  Pulse 76  Temp(Src) 97.5 F (36.4 C) (Oral)  Resp 18  Ht 5\' 8"  (1.727 m)  Wt 66.18 kg (  145 lb 14.4 oz)  BMI 22.19 kg/m2  SpO2 100% Gen: NAD, Somewhat lethargic. HEENT: PEERLA, EOMI, Neck: supple, no JVD or thyromegaly Chest: CTA bilaterally, no wheezes, crackles, or other adventitious sounds CV: RRR, no m/g/c/r Abd: soft, epigastric tenderness, ND, +BS in all four quadrants; no HSM, guarding, ridigity, or rebound tenderness Ext: no edema, well perfused with 2+ pulses, Skin: no rash or lesions noted Lymph: no LAD  Data: Lab Results  Component Value Date   WBC 5.6 05/13/2015   HGB 13.3  05/13/2015   HCT 39.3* 05/13/2015   MCV 92.5 05/13/2015   PLT 123* 05/13/2015    Recent Labs Lab 05/12/15 1732 05/13/15 0047  HGB 13.8 13.3   Lab Results  Component Value Date   NA 140 05/13/2015   K 3.4* 05/13/2015   CL 109 05/13/2015   CO2 22 05/13/2015   BUN 6 05/13/2015   CREATININE 0.63 05/13/2015   Lab Results  Component Value Date   ALT 49 05/12/2015   AST 43* 05/12/2015   ALKPHOS 61 05/12/2015   BILITOT 0.5 05/12/2015   No results for input(s): APTT, INR, PTT in the last 168 hours. Assessment/Plan: Mr. Halliwell is a 54 y.o. male with likely UGI bleeding from PUD, alcoholic gastritis, etc.  Recommendations: Agree with IVF, CIWA protocol, protonix IV. Discussed scheduling EGD tomorrow to localize source of bleeding. However, pt not interested in EGD at this time. Can start liquid diet today. Thank you for the consult. Please call with questions or concerns.  Maille Halliwell, Lupita Dawn, MD

## 2015-05-13 NOTE — Assessment & Plan Note (Signed)
He has a cystic structure in the right posterior prostate on recent CT but no fever or voiding complaints to suggest a UTI.  He does have some pyuria.  His exam is benign and on review of 2 prior CT scans there was a faint low density area of the right prostatic apex but there wasn't contrast sufficient to assess the prostate anatomy.  I don't think he has a prostatic abscess, at least not an acute abscess.   He has a prostatic cyst and the pyuria there is a chance it could have a chronic infection, but acute intervention isn't needed.  I would recommend a urine culture with treatment if positive and he will need outpatient urologic f/u with [facility] urology in the next 2-3 weeks.   I have sent the request for an appt to the office.

## 2015-05-13 NOTE — Progress Notes (Signed)
[facility] NAME: [name]    MR#:  [id number]  DATE OF BIRTH:  February 19, 1961  SUBJECTIVE:  CHIEF COMPLAINT:  Patient is resting comfortably. Lethargic but answers questions. Reporting epigastric abdominal pain. Denies any other episodes of hematemesis , not sure about melena  REVIEW OF SYSTEMS:  CONSTITUTIONAL: No fever, fatigue or weakness.  EYES: No blurred or double vision.  EARS, NOSE, AND THROAT: No tinnitus or ear pain.  RESPIRATORY: No cough, shortness of breath, wheezing or hemoptysis.  CARDIOVASCULAR: No chest pain, orthopnea, edema.  GASTROINTESTINAL: No nausea, vomiting, diarrhea .reporting epigastric abdominal pain.  GENITOURINARY: No dysuria, hematuria.  ENDOCRINE: No polyuria, nocturia,  HEMATOLOGY: No anemia, easy bruising or bleeding SKIN: No rash or lesion. MUSCULOSKELETAL: No joint pain or arthritis.   NEUROLOGIC: No tingling, numbness, weakness.  PSYCHIATRY: No anxiety or depression.   DRUG ALLERGIES:  No Known Allergies  VITALS:  Blood pressure 137/84, pulse 73, temperature 97.5 F (36.4 C), temperature source Oral, resp. rate 16, height 5\' 8"  (1.727 m), weight 66.18 kg (145 lb 14.4 oz), SpO2 100 %.  PHYSICAL EXAMINATION:  GENERAL:  54 y.o.-year-old patient lying in the bed with no acute distress.  EYES: Pupils equal, round, reactive to light and accommodation. No scleral icterus. Extraocular muscles intact.  HEENT: Head atraumatic, normocephalic. Oropharynx and nasopharynx clear.  NECK:  Supple, no jugular venous distention. No thyroid enlargement, no tenderness.  LUNGS: Normal breath sounds bilaterally, no wheezing, rales,rhonchi or crepitation. No use of accessory muscles of respiration.  CARDIOVASCULAR: S1, S2 normal. No murmurs, rubs, or gallops.  ABDOMEN: Soft, epigastric area is tender but no rebound tenderness.nondistended. Bowel sounds present. No organomegaly or mass.  EXTREMITIES: No  pedal edema, cyanosis, or clubbing.  NEUROLOGIC: Patient is lethargic but follows verbal commands Cranial nerves II through XII are grossly intact. Muscle strength grossly intact. Sensation intact. Gait not checked.  PSYCHIATRIC: The patient is alert and oriented x 2-3.  SKIN: No obvious rash, lesion, or ulcer.    LABORATORY PANEL:   CBC  Recent Labs Lab 05/13/15 0047  WBC 5.6  HGB 13.3  HCT 39.3*  PLT 123*   ------------------------------------------------------------------------------------------------------------------  Chemistries   Recent Labs Lab 05/12/15 1732 05/13/15 0047  NA 133* 140  K 3.3* 3.4*  CL 103 109  CO2 20* 22  GLUCOSE 356* 268*  BUN 7 6  CREATININE 0.65 0.63  CALCIUM 9.2 9.0  AST 43*  --   ALT 49  --   ALKPHOS 61  --   BILITOT 0.5  --    ------------------------------------------------------------------------------------------------------------------  Cardiac Enzymes No results for input(s): TROPONINI in the last 168 hours. ------------------------------------------------------------------------------------------------------------------  RADIOLOGY:  Ct Abdomen Pelvis W Contrast  05/12/2015  CLINICAL DATA:  Subacute onset of left lower quadrant abdominal pain for 2 weeks. Bloody diarrhea. Initial encounter. EXAM: CT ABDOMEN AND PELVIS WITH CONTRAST TECHNIQUE: Multidetector CT imaging of the abdomen and pelvis was performed using the standard protocol following bolus administration of intravenous contrast. CONTRAST:  139mL OMNIPAQUE IOHEXOL 300 MG/ML  SOLN COMPARISON:  CT of the abdomen and pelvis from 09/10/2012, and MRI of the lumbar spine performed 08/08/2013 FINDINGS: The visualized lung bases are clear. Chronic left-sided rib deformities are noted. There is marked diffuse fatty infiltration within the liver. A 1.3 cm focus of contrast blush within the posterior right hepatic lobe is grossly unchanged from 2014 and likely benign. Mild focal fatty  sparing is noted about the gallbladder fossa.  The spleen is unremarkable in appearance. The gallbladder is within normal limits. Diffuse calcification within the pancreas reflects sequelae of chronic pancreatitis. The adrenal glands are unremarkable in appearance. A few small nodes adjacent to the gastroesophageal junction likely remain within normal limits. The kidneys are unremarkable in appearance. There is no evidence of hydronephrosis. No renal or ureteral stones are seen. No perinephric stranding is appreciated. No free fluid is identified. The small bowel is unremarkable in appearance. The stomach is within normal limits. No acute vascular abnormalities are seen. Mild calcification is noted along the abdominal aorta and its branches. The appendix is normal in caliber and contains air, without evidence of appendicitis. Scattered diverticulosis is noted along the ascending and descending colon, without evidence of diverticulitis. The bladder is mildly distended. Apparent bladder wall thickening could reflect mild cystitis, depending on the patient's symptoms. An unusual 2.7 x 1.8 cm collection of fluid is noted within the right side of the prostate gland. Would correlate for history of prior surgery, to exclude an intraprostatic abscess. No inguinal lymphadenopathy is seen. No acute osseous abnormalities are identified. IMPRESSION: 1. Apparent bladder wall thickening could reflect mild cystitis, depending on the patient's symptoms. 2. Unusual new 2.7 x 1.8 cm collection of fluid noted within the right side of the prostate gland. Would correlate for history of prior surgery, to exclude an intraprostatic abscess. 3. Marked diffuse fatty infiltration within the liver. 1.3 cm focus of contrast blush at the posterior right hepatic lobe is stable from 2014 and likely benign. 4. Diffuse calcification within the pancreas reflects sequelae of chronic pancreatitis. 5. Mild calcification along the abdominal aorta and its  branches. 6. Scattered diverticulosis along the ascending and descending colon, without evidence of diverticulitis. Electronically Signed   By: Garald Balding M.D.   On: 05/12/2015 20:31    EKG:   Orders placed or performed in visit on 09/10/12  . EKG 12-Lead    ASSESSMENT AND PLAN:     1.acute GI bleed - suspect upper GI bleed.  We'll continue on Protonix drip Clear liquid diet  Monitor hemoglobin and hematocrit. Hemoglobin is stable at 13.2 today. No need of blood transfusion Patient refused to get EGD, worried about financial issues. We will consult social worker regarding financial assistance we will reassess the patient as the patient is lethargic today Appreciate GI recommendations   2.Type 2 diabetes mellitus (HCC) with hyperglycemia Patient is started on Lantus 10 units subcutaneously Repeat BMP is ordered  - sliding scale insulin sensitive scale is changed to moderate, with appropriate fingerstick glucose monitoring every 6 hours We'll also check a hemoglobin A1c.  3. GERD (gastroesophageal reflux disease) - pantoprazole drip as above.   4.ETOH abuse - CIWA protocol, banana bag  5.BPH (benign prostatic hyperplasia) - abnormal imaging of his prostate on CT abdomen and pelvis.  Appreciate urology recommendations. No prostate abscess as per rectal exam. Urine culture and sensitivity is ordered. Recommending to consider antibiotics if urine culture is positive Outpatient follow-up with urology in 2-3 weeks is recommended     All the records are reviewed and case discussed with Care Management/Social Workerr. Management plans discussed with the patient, family and they are in agreement.  CODE STATUS: FC  TOTAL TIME TAKING CARE OF THIS PATIENT: 35 minutes.   POSSIBLE D/C IN 1-2 DAYS, DEPENDING ON CLINICAL CONDITION.   Nicholes Mango M.D on 05/13/2015 at 1:25 PM  Between 7am to 6pm - Pager - [phone] After 6pm go to [email or web address] - password [email or web address]  [facility]  Office  [phone]  CC: Primary care physician; [name], MD

## 2015-05-13 NOTE — Progress Notes (Signed)
Notified Dr. Margaretmary Eddy of pt CBG 500 after recheck. Received new order to give 18 units novolog. MD to place additional order.

## 2015-05-13 NOTE — Consult Note (Signed)
Subjective: I was asked to see Mr. [name] in consultation by Dr. Jannifer Franklin.  He is a 54 y.o. male who presented with abdominal pain and rectal bleeding. He had a CT as part of his evaluation and was noted to have an approximately 2cm cystic struction in the right posterior apical prostate with the possibility of a prostatic abscess noted.   He has a prior history of epididymitis with some form of scrotal surgery and has had scrotal cellulitis and prior trich.  He currently denies fever, perineal pain or voiding complaints. His UA this admission has 31 WBC's  He is afebrile with a normal WBC and has not been on antibiotics.  No urine culture has been done.  ROS:  Review of Systems  Constitutional: Negative for fever and chills.  Respiratory: Negative.   Cardiovascular: Negative.   Gastrointestinal: Positive for abdominal pain (RUQ), diarrhea and blood in stool.  Genitourinary: Negative.   Musculoskeletal: Negative.   All other systems reviewed and are negative.   No Known Allergies  Past Medical History  Diagnosis Date  . Epididymoorchitis   . Diabetes ([place])   . Cellulitis of scrotum   . Difficulty urinating   . BPH without obstruction/lower urinary tract symptoms   . Trichomoniasis   . Cyst of prostate 05/13/2015    Past Surgical History  Procedure Laterality Date  . Resection of pancreas    . Scrotal exploration      Social History   Social History  . Marital Status: Single    Spouse Name: N/A  . Number of Children: N/A  . Years of Education: N/A   Occupational History  . Not on file.   Social History Main Topics  . Smoking status: Current Every Day Smoker  . Smokeless tobacco: Not on file  . Alcohol Use: 0.0 oz/week    0 Standard drinks or equivalent per week     Comment: today 05/12/15  . Drug Use: No  . Sexual Activity: Yes   Other Topics Concern  . Not on file   Social History Narrative    Family History  Problem Relation Age of Onset  . Cancer  Father     Anti-infectives: Anti-infectives    None      Current Facility-Administered Medications  Medication Dose Route Frequency Provider Last Rate Last Dose  . 0.9 %  sodium chloride infusion   Intravenous Continuous Lance Coon, MD   Stopped at 05/13/15 [phone]  . acetaminophen (TYLENOL) tablet 650 mg  650 mg Oral Q6H PRN Lance Coon, MD       Or  . acetaminophen (TYLENOL) suppository 650 mg  650 mg Rectal Q6H PRN Lance Coon, MD      . folic acid (FOLVITE) tablet 1 mg  1 mg Oral Daily Lance Coon, MD   1 mg at 05/13/15 [id number]  . insulin aspart (novoLOG) injection 0-9 Units  0-9 Units Subcutaneous Q6H PRN Lance Coon, MD      . LORazepam (ATIVAN) injection 0-4 mg  0-4 mg Intravenous Q6H Lance Coon, MD   1 mg at 05/13/15 0520   Followed by  . [START ON 05/14/2015] LORazepam (ATIVAN) injection 0-4 mg  0-4 mg Intravenous Q12H Lance Coon, MD      . LORazepam (ATIVAN) tablet 1 mg  1 mg Oral Q6H PRN Lance Coon, MD   1 mg at 05/12/15 2347   Or  . LORazepam (ATIVAN) injection 1 mg  1 mg Intravenous Q6H PRN Lance Coon, MD      .  morphine 4 MG/ML injection 4 mg  4 mg Intravenous Q4H PRN Lance Coon, MD   4 mg at 05/13/15 [id number]  . multivitamin with minerals tablet 1 tablet  1 tablet Oral Daily Lance Coon, MD   1 tablet at 05/13/15 [phone]  . ondansetron (ZOFRAN) tablet 4 mg  4 mg Oral Q6H PRN Lance Coon, MD       Or  . ondansetron [facility]) injection 4 mg  4 mg Intravenous Q6H PRN Lance Coon, MD      . pantoprazole (PROTONIX) 80 mg in sodium chloride 0.9 % 250 mL (0.32 mg/mL) infusion  8 mg/hr Intravenous Continuous Harvest Dark, MD 25 mL/hr at 05/12/15 2153 8 mg/hr at 05/12/15 2153  . [START ON 05/16/2015] pantoprazole (PROTONIX) injection 40 mg  40 mg Intravenous Q12H Harvest Dark, MD      . sodium chloride 0.9 % injection 3 mL  3 mL Intravenous Q12H Lance Coon, MD   3 mL at 05/13/15 0003  . thiamine (VITAMIN B-1) tablet 100 mg  100 mg Oral Daily Lance Coon, MD    100 mg at 05/13/15 [id number]   Or  . thiamine (B-1) injection 100 mg  100 mg Intravenous Daily Lance Coon, MD         Objective: Vital signs in last 24 hours: Temp:  [97.5 F (36.4 C)-97.8 F (36.6 C)] 97.5 F (36.4 C) (11/13 0513) Pulse Rate:  [76-94] 76 (11/13 0513) Resp:  [14-26] 18 (11/13 0513) BP: (98-141)/(64-93) 141/74 mmHg (11/13 0513) SpO2:  [97 %-100 %] 100 % (11/13 0513) Weight:  [63.504 kg (140 lb)-66.18 kg (145 lb 14.4 oz)] 66.18 kg (145 lb 14.4 oz) (11/12 2237)  Intake/Output from previous day: 11/12 0701 - 11/13 0700 In: 439 [I.V.:439] Out: 500 [Urine:500] Intake/Output this shift: Total I/O In: 260 [I.V.:260] Out: -    Physical Exam  Constitutional: He is well-developed, well-nourished, and in no distress.  Lethargic and apparently medicated  HENT:  Head: Normocephalic and atraumatic.  Neck: Normal range of motion. Neck supple.  Cardiovascular: Normal rate, regular rhythm and normal heart sounds.   Pulmonary/Chest: Effort normal and breath sounds normal. No respiratory distress.  Abdominal: He exhibits no mass. There is tenderness (greatest in left abdomen.   No hernias are noted. ). There is guarding.  Genitourinary:  He has a normal circumcised phallus with an adequate meatus.    Scrotum is normal as are the testes and epididymes.  Anus/Perineum are normal with NST and no masses.  The vault is empty.  Prostate is 2+ in size with benign consistency.   No nodules or tenderness are noted.    SV's are non-palpable.   Musculoskeletal: Normal range of motion. He exhibits no edema or tenderness.  Neurological:  Sleepy but oriented x 3.   Skin: Skin is warm and dry.  Psychiatric: Mood and affect normal.  Vitals reviewed.   Lab Results:   Recent Labs  05/12/15 1732 05/13/15 0047  WBC 7.0 5.6  HGB 13.8 13.3  HCT 41.1 39.3*  PLT 131* 123*   BMET  Recent Labs  05/12/15 1732 05/13/15 0047  NA 133* 140  K 3.3* 3.4*  CL 103 109  CO2 20* 22   GLUCOSE 356* 268*  BUN 7 6  CREATININE 0.65 0.63  CALCIUM 9.2 9.0   PT/INR No results for input(s): LABPROT, INR in the last 72 hours. ABG No results for input(s): PHART, HCO3 in the last 72 hours.  Invalid input(s): PCO2, PO2  Studies/Results: Ct  Abdomen Pelvis W Contrast  05/12/2015  CLINICAL DATA:  Subacute onset of left lower quadrant abdominal pain for 2 weeks. Bloody diarrhea. Initial encounter. EXAM: CT ABDOMEN AND PELVIS WITH CONTRAST TECHNIQUE: Multidetector CT imaging of the abdomen and pelvis was performed using the standard protocol following bolus administration of intravenous contrast. CONTRAST:  125mL OMNIPAQUE IOHEXOL 300 MG/ML  SOLN COMPARISON:  CT of the abdomen and pelvis from 09/10/2012, and MRI of the lumbar spine performed 08/08/2013 FINDINGS: The visualized lung bases are clear. Chronic left-sided rib deformities are noted. There is marked diffuse fatty infiltration within the liver. A 1.3 cm focus of contrast blush within the posterior right hepatic lobe is grossly unchanged from 2014 and likely benign. Mild focal fatty sparing is noted about the gallbladder fossa. The spleen is unremarkable in appearance. The gallbladder is within normal limits. Diffuse calcification within the pancreas reflects sequelae of chronic pancreatitis. The adrenal glands are unremarkable in appearance. A few small nodes adjacent to the gastroesophageal junction likely remain within normal limits. The kidneys are unremarkable in appearance. There is no evidence of hydronephrosis. No renal or ureteral stones are seen. No perinephric stranding is appreciated. No free fluid is identified. The small bowel is unremarkable in appearance. The stomach is within normal limits. No acute vascular abnormalities are seen. Mild calcification is noted along the abdominal aorta and its branches. The appendix is normal in caliber and contains air, without evidence of appendicitis. Scattered diverticulosis is noted  along the ascending and descending colon, without evidence of diverticulitis. The bladder is mildly distended. Apparent bladder wall thickening could reflect mild cystitis, depending on the patient's symptoms. An unusual 2.7 x 1.8 cm collection of fluid is noted within the right side of the prostate gland. Would correlate for history of prior surgery, to exclude an intraprostatic abscess. No inguinal lymphadenopathy is seen. No acute osseous abnormalities are identified. IMPRESSION: 1. Apparent bladder wall thickening could reflect mild cystitis, depending on the patient's symptoms. 2. Unusual new 2.7 x 1.8 cm collection of fluid noted within the right side of the prostate gland. Would correlate for history of prior surgery, to exclude an intraprostatic abscess. 3. Marked diffuse fatty infiltration within the liver. 1.3 cm focus of contrast blush at the posterior right hepatic lobe is stable from 2014 and likely benign. 4. Diffuse calcification within the pancreas reflects sequelae of chronic pancreatitis. 5. Mild calcification along the abdominal aorta and its branches. 6. Scattered diverticulosis along the ascending and descending colon, without evidence of diverticulitis. Electronically Signed   By: Garald Balding M.D.   On: 05/12/2015 20:31   I have reviewed the recent CT films and reports and compared it with prior studies.   I have reviewed his recent and prior UA's as well as his current labs.  I have reviewed the hospital notes.  Assessment: Cyst of prostate He has a cystic structure in the right posterior prostate on recent CT but no fever or voiding complaints to suggest a UTI.  He does have some pyuria.  His exam is benign and on review of 2 prior CT scans there was a faint low density area of the right prostatic apex but there wasn't contrast sufficient to assess the prostate anatomy.  I don't think he has a prostatic abscess, at least not an acute abscess.   He has a prostatic cyst and the  pyuria there is a chance it could have a chronic infection, but acute intervention isn't needed.  I would recommend a urine  culture with treatment if positive and he will need outpatient urologic f/u with [facility] urology in the next 2-3 weeks.   I have sent the request for an appt to the office.     CC: Lance Coon MD    Malka So 05/13/2015 [phone]

## 2015-05-13 NOTE — Progress Notes (Signed)
Pt non compliant with current diet. Requesting diet change, asked Dr. Bridgett Larsson for upgrade in diet, received no new orders and continue with clears as GI ordered. Went in room pt was eating food, reminded pt of current diet.  Today pt wanted to go off floor to smoke, reminded pt of high fall risk, not leaving off the floor without an order, and need to stay on floor to receive proper care. Pt complied.  Pt slept off and on today, requesting pain medication prn.

## 2015-05-14 LAB — BASIC METABOLIC PANEL
ANION GAP: 5 (ref 5–15)
BUN: 8 mg/dL (ref 6–20)
CALCIUM: 8.6 mg/dL — AB (ref 8.9–10.3)
CO2: 27 mmol/L (ref 22–32)
CREATININE: 0.59 mg/dL — AB (ref 0.61–1.24)
Chloride: 106 mmol/L (ref 101–111)
GFR calc Af Amer: >60 mL/min (ref >60)
GLUCOSE: 293 mg/dL — AB (ref 65–99)
Potassium: 3.2 mmol/L — ABNORMAL LOW (ref 3.5–5.1)
Sodium: 138 mmol/L (ref 135–145)

## 2015-05-14 LAB — CBC
HCT: 37 % — ABNORMAL LOW (ref 40.0–52.0)
Hemoglobin: 12.4 g/dL — ABNORMAL LOW (ref 13.0–18.0)
MCH: 31.1 pg (ref 26.0–34.0)
MCHC: 33.4 g/dL (ref 32.0–36.0)
MCV: 93.1 fL (ref 80.0–100.0)
PLATELETS: 109 10*3/uL — AB (ref 150–440)
RBC: 3.97 MIL/uL — ABNORMAL LOW (ref 4.40–5.90)
RDW: 13.8 % (ref 11.5–14.5)
WBC: 5.9 10*3/uL (ref 3.8–10.6)

## 2015-05-14 LAB — GLUCOSE, CAPILLARY
GLUCOSE-CAPILLARY: 320 mg/dL — AB (ref 65–99)
Glucose-Capillary: 287 mg/dL — ABNORMAL HIGH (ref 65–99)
Glucose-Capillary: 83 mg/dL (ref 65–99)

## 2015-05-14 LAB — HEMOGLOBIN A1C: Hgb A1c MFr Bld: 11.8 % — ABNORMAL HIGH (ref 4.0–6.0)

## 2015-05-14 MED ORDER — PANTOPRAZOLE SODIUM 40 MG IV SOLR: 40.0000 mg | Freq: Two times a day (BID) | INTRAVENOUS | Status: DC

## 2015-05-14 MED ORDER — INSULIN GLARGINE 100 UNIT/ML ~~LOC~~ SOLN
10.0000 [IU] | Freq: Every day | SUBCUTANEOUS | Status: DC
  Filled 2015-05-14: qty 0.1

## 2015-05-14 MED ORDER — ADULT MULTIVITAMIN W/MINERALS CH: 1.0000 | ORAL_TABLET | Freq: Every day | ORAL | Status: DC

## 2015-05-14 NOTE — Progress Notes (Signed)
Pt left without signing discharge papers or receiving scripts. Called pt too see if he could come sign his papers and get his scripts. Pt states he is unable to come back and would like his scripts sent to [facility].  Md Marthann Schiller called and notified and  to see if he can order scripts electronically .

## 2015-05-14 NOTE — Progress Notes (Signed)
Charge to assist with discharge.  Pt left without signing papers, IV sites removed.

## 2015-05-14 NOTE — Discharge Summary (Signed)
[facility] [place] NAME: [name]    MR#:  [id number]  DATE OF BIRTH:  March 23, 1961  DATE OF ADMISSION:  05/12/2015 ADMITTING PHYSICIAN: Lance Coon, MD  DATE OF DISCHARGE: 05/14/2015  PRIMARY CARE PHYSICIAN: [name], MD    ADMISSION DIAGNOSIS:  Abdominal pain, unspecified abdominal location [R10.9] Gastrointestinal hemorrhage, unspecified gastritis, unspecified gastrointestinal hemorrhage type [K92.2]  DISCHARGE DIAGNOSIS:  Principal Problem:   GI bleed Active Problems:   Type 2 diabetes mellitus (HCC)   GERD (gastroesophageal reflux disease)   ETOH abuse   BPH (benign prostatic hyperplasia)   Cyst of prostate   SECONDARY DIAGNOSIS:   Past Medical History  Diagnosis Date  . Epididymoorchitis   . Diabetes ([place])   . Cellulitis of scrotum   . Difficulty urinating   . BPH without obstruction/lower urinary tract symptoms   . Trichomoniasis   . Cyst of prostate 05/13/2015    HOSPITAL COURSE:   1.acute GI bleed - suspect upper GI bleed.  continued on Protonix drip  started with liquid diet and upgraded to soft diet. Monitor hemoglobin and hematocrit. Hemoglobin is stable at 13.2 today. No need of blood transfusion. Patient refused to get EGD, worried about financial issues. Appreciate GI recommendations As there was no more bleed- advised to follow in GI clinic as out pt.   2.Type 2 diabetes mellitus ([place]) with hyperglycemia Patient is started on Lantus 10 units subcutaneously Repeat BMP is ordered - sliding scale insulin sensitive scale is changed to moderate, with appropriate fingerstick glucose monitoring every 6 hours Resume his some dose on d/c.  3. GERD (gastroesophageal reflux disease) - pantoprazole drip as above.  given oral BID on d/c.  4.ETOH abuse - CIWA protocol, banana bag   Councelled again to stop drinking.  5.BPH (benign prostatic hyperplasia) - abnormal imaging of his  prostate on CT abdomen and pelvis.  Appreciate urology recommendations. No prostate abscess as per rectal exam. Urine culture and sensitivity is ordered. Outpatient follow-up with urology in 2-3 weeks is recommended   DISCHARGE CONDITIONS:   stable  CONSULTS OBTAINED:  Treatment Team:  Irine Seal, MD Hulen Luster, MD  DRUG ALLERGIES:  No Known Allergies  DISCHARGE MEDICATIONS:   Current Discharge Medication List    START taking these medications   Details  Multiple Vitamin (MULTIVITAMIN WITH MINERALS) TABS tablet Take 1 tablet by mouth daily. Qty: 100 tablet, Refills: 0    pantoprazole (PROTONIX) 40 MG injection Inject 40 mg into the vein every 12 (twelve) hours. Qty: 60 each, Refills: 0      CONTINUE these medications which have NOT CHANGED   Details  EVZIO 0.4 MG/0.4ML SOAJ Inject 0.4 mLs as directed daily as needed. For overdose. Refills: 0    LANTUS SOLOSTAR 100 UNIT/ML Solostar Pen Inject 10 Units into the skin 2 (two) times daily.  Refills: 1    Oxycodone HCl 10 MG TABS Take 10 mg by mouth every 8 (eight) hours as needed (for pain.).  Refills: 0         DISCHARGE INSTRUCTIONS:   Follow with GI and urology clinic in 2 weeks.  If you experience worsening of your admission symptoms, develop shortness of breath, life threatening emergency, suicidal or homicidal thoughts you must seek medical attention immediately by calling 911 or calling your MD immediately  if symptoms less severe.  You Must read complete instructions/literature along with all the possible adverse reactions/side effects for all the Medicines  you take and that have been prescribed to you. Take any new Medicines after you have completely understood and accept all the possible adverse reactions/side effects.   Please note  You were cared for by a hospitalist during your hospital stay. If you have any questions about your discharge medications or the care you received while you were in the  hospital after you are discharged, you can call the unit and asked to speak with the hospitalist on call if the hospitalist that took care of you is not available. Once you are discharged, your primary care physician will handle any further medical issues. Please note that NO REFILLS for any discharge medications will be authorized once you are discharged, as it is imperative that you return to your primary care physician (or establish a relationship with a primary care physician if you do not have one) for your aftercare needs so that they can reassess your need for medications and monitor your lab values.   Today   CHIEF COMPLAINT:   Chief Complaint  Patient presents with  . Rectal Bleeding    HISTORY OF PRESENT ILLNESS:  [name]  is a 54 y.o. male presents with abdominal pain and rectal bleeding. Patient states that he has had some intermittent bleeding mixed with stool for the past 1-2 weeks. He also states that his abdominal pain has been intermittent for about the same time period. His pain is left upper quadrant and epigastric. He states that last week he had some episodes of vomiting and also vomited up some blood. However, today his pain grew intensely worse and he began having grossly bloody stools. At that point he came to the ED for evaluation. In the ED he was found to have stable labs, specifically including a stable hemoglobin. However, his rectal exam was grossly bloody. Patient is also known alcohol user, and had a mildly elevated blood alcohol level. Hospitalists were called for admission for GI bleed.   VITAL SIGNS:  Blood pressure 140/94, pulse 73, temperature 97.8 F (36.6 C), temperature source Oral, resp. rate 17, height 5\' 8"  (1.727 m), weight 66.18 kg (145 lb 14.4 oz), SpO2 100 %.  I/O:   Intake/Output Summary (Last 24 hours) at 05/14/15 1320 Last data filed at 05/14/15 0900  Gross per 24 hour  Intake    960 ml  Output    900 ml  Net     60 ml    PHYSICAL  EXAMINATION:   GENERAL: 54 y.o.-year-old patient lying in the bed with no acute distress.  EYES: Pupils equal, round, reactive to light and accommodation. No scleral icterus. Extraocular muscles intact.  HEENT: Head atraumatic, normocephalic. Oropharynx and nasopharynx clear.  NECK: Supple, no jugular venous distention. No thyroid enlargement, no tenderness.  LUNGS: Normal breath sounds bilaterally, no wheezing, rales,rhonchi or crepitation. No use of accessory muscles of respiration.  CARDIOVASCULAR: S1, S2 normal. No murmurs, rubs, or gallops.  ABDOMEN: Soft, epigastric area is tender but no rebound tenderness.nondistended. Bowel sounds present. No organomegaly or mass.  EXTREMITIES: No pedal edema, cyanosis, or clubbing.  NEUROLOGIC: Patient is lethargic but follows verbal commands Cranial nerves II through XII are grossly intact. Muscle strength grossly intact. Sensation intact. Gait not checked.  PSYCHIATRIC: The patient is alert and oriented x 2-3.  SKIN: No obvious rash, lesion, or ulcer.   DATA REVIEW:   CBC  Recent Labs Lab 05/14/15 0644  WBC 5.9  HGB 12.4*  HCT 37.0*  PLT 109*    Chemistries  Recent Labs Lab 05/12/15 1732  05/14/15 0644  NA 133*  < > 138  K 3.3*  < > 3.2*  CL 103  < > 106  CO2 20*  < > 27  GLUCOSE 356*  < > 293*  BUN 7  < > 8  CREATININE 0.65  < > 0.59*  CALCIUM 9.2  < > 8.6*  AST 43*  --   --   ALT 49  --   --   ALKPHOS 61  --   --   BILITOT 0.5  --   --   < > = values in this interval not displayed.  Cardiac Enzymes No results for input(s): TROPONINI in the last 168 hours.  Microbiology Results  Results for orders placed or performed in visit on 09/10/12  Culture, blood (single)     Status: None   Collection Time: 09/09/12 11:48 PM  Result Value Ref Range Status   Micro Text Report   Final       COMMENT                   NO GROWTH AEROBICALLY/ANAEROBICALLY IN 5 DAYS   ANTIBIOTIC                                                       Culture, blood (single)     Status: None   Collection Time: 09/10/12 12:10 AM  Result Value Ref Range Status   Micro Text Report   Final       COMMENT                   NO GROWTH AEROBICALLY/ANAEROBICALLY IN 5 DAYS   ANTIBIOTIC                                                        RADIOLOGY:  Ct Abdomen Pelvis W Contrast  05/12/2015  CLINICAL DATA:  Subacute onset of left lower quadrant abdominal pain for 2 weeks. Bloody diarrhea. Initial encounter. EXAM: CT ABDOMEN AND PELVIS WITH CONTRAST TECHNIQUE: Multidetector CT imaging of the abdomen and pelvis was performed using the standard protocol following bolus administration of intravenous contrast. CONTRAST:  18mL OMNIPAQUE IOHEXOL 300 MG/ML  SOLN COMPARISON:  CT of the abdomen and pelvis from 09/10/2012, and MRI of the lumbar spine performed 08/08/2013 FINDINGS: The visualized lung bases are clear. Chronic left-sided rib deformities are noted. There is marked diffuse fatty infiltration within the liver. A 1.3 cm focus of contrast blush within the posterior right hepatic lobe is grossly unchanged from 2014 and likely benign. Mild focal fatty sparing is noted about the gallbladder fossa. The spleen is unremarkable in appearance. The gallbladder is within normal limits. Diffuse calcification within the pancreas reflects sequelae of chronic pancreatitis. The adrenal glands are unremarkable in appearance. A few small nodes adjacent to the gastroesophageal junction likely remain within normal limits. The kidneys are unremarkable in appearance. There is no evidence of hydronephrosis. No renal or ureteral stones are seen. No perinephric stranding is appreciated. No free fluid is identified. The small bowel is unremarkable in appearance. The stomach is within normal limits. No acute vascular abnormalities  are seen. Mild calcification is noted along the abdominal aorta and its branches. The appendix is normal in caliber and contains air,  without evidence of appendicitis. Scattered diverticulosis is noted along the ascending and descending colon, without evidence of diverticulitis. The bladder is mildly distended. Apparent bladder wall thickening could reflect mild cystitis, depending on the patient's symptoms. An unusual 2.7 x 1.8 cm collection of fluid is noted within the right side of the prostate gland. Would correlate for history of prior surgery, to exclude an intraprostatic abscess. No inguinal lymphadenopathy is seen. No acute osseous abnormalities are identified. IMPRESSION: 1. Apparent bladder wall thickening could reflect mild cystitis, depending on the patient's symptoms. 2. Unusual new 2.7 x 1.8 cm collection of fluid noted within the right side of the prostate gland. Would correlate for history of prior surgery, to exclude an intraprostatic abscess. 3. Marked diffuse fatty infiltration within the liver. 1.3 cm focus of contrast blush at the posterior right hepatic lobe is stable from 2014 and likely benign. 4. Diffuse calcification within the pancreas reflects sequelae of chronic pancreatitis. 5. Mild calcification along the abdominal aorta and its branches. 6. Scattered diverticulosis along the ascending and descending colon, without evidence of diverticulitis. Electronically Signed   By: Garald Balding M.D.   On: 05/12/2015 20:31    EKG:   Orders placed or performed in visit on 09/10/12  . EKG 12-Lead    Management plans discussed with the patient, family and they are in agreement.  CODE STATUS:     Code Status Orders        Start     Ordered   05/12/15 2240  Full code   Continuous     05/12/15 2239      TOTAL TIME TAKING CARE OF THIS PATIENT: 35 minutes.    Vaughan Basta M.D on 05/14/2015 at 1:20 PM  Between 7am to 6pm - Pager - [phone]  After 6pm go to [email or web address] - password [email or web address] [facility]  Office  [phone]  CC: Primary care physician; [name], MD   Note: This dictation was prepared with Dragon dictation along with smaller phrase technology. Any transcriptional errors that result from this process are unintentional.

## 2015-05-14 NOTE — Progress Notes (Signed)
Pt currently on clear liquid diet. Refused lunch tray. Pt complained of pain, offered pt prn tylenol. Pt declined prn medication. Stated "take this stuff off, I'm ready to go". Pt requesting to leave the hospital. Pt stated "I am not getting treated for anything" anyway.  Notified MD of pt request to leave. MD to round today and discussed plan to discharge pt, being he tolerated soft diet well. Notified pt of updates in plan of care, Soft diet was ordered for pt. called dining services to send soft diet house tray for pt. Asked pt to wait for MD to discharge him as planned. Pt currently waiting in room. Anticipating possible AMA leave. MD aware. continue to assess.

## 2015-05-14 NOTE — Consult Note (Signed)
  GI Inpatient Follow-up Note  Patient Identification: [name] is a 54 y.o. male  Subjective: Eating lunch. More alert today. No complaints of abdominal pain.  Scheduled Inpatient Medications:  . folic acid  1 mg Oral Daily  . insulin aspart  0-15 Units Subcutaneous TID WC  . insulin aspart  0-5 Units Subcutaneous QHS  . insulin glargine  10 Units Subcutaneous Daily  . LORazepam  0-4 mg Intravenous Q6H   Followed by  . LORazepam  0-4 mg Intravenous Q12H  . multivitamin with minerals  1 tablet Oral Daily  . [START ON 05/16/2015] pantoprazole (PROTONIX) IV  40 mg Intravenous Q12H  . sodium chloride  3 mL Intravenous Q12H  . thiamine  100 mg Oral Daily   Or  . thiamine  100 mg Intravenous Daily    Continuous Inpatient Infusions:   . pantoprozole (PROTONIX) infusion 8 mg/hr (05/14/15 0521)    PRN Inpatient Medications:  acetaminophen **OR** acetaminophen, LORazepam **OR** LORazepam, morphine injection, ondansetron **OR** ondansetron (ZOFRAN) IV  Review of Systems: Constitutional: Weight is stable.  Eyes: No changes in vision. ENT: No oral lesions, sore throat.  GI: see HPI.  Heme/Lymph: No easy bruising.  CV: No chest pain.  GU: No hematuria.  Integumentary: No rashes.  Neuro: No headaches.  Psych: No depression/anxiety.  Endocrine: No heat/cold intolerance.  Allergic/Immunologic: No urticaria.  Resp: No cough, SOB.  Musculoskeletal: No joint swelling.    Physical Examination: BP 140/94 mmHg  Pulse 73  Temp(Src) 97.8 F (36.6 C) (Oral)  Resp 17  Ht 5\' 8"  (1.727 m)  Wt 66.18 kg (145 lb 14.4 oz)  BMI 22.19 kg/m2  SpO2 100% Gen: NAD, alert and oriented x 4 HEENT: PEERLA, EOMI, Neck: supple, no JVD or thyromegaly Chest: CTA bilaterally, no wheezes, crackles, or other adventitious sounds CV: RRR, no m/g/c/r Abd: soft, NT, ND, +BS in all four quadrants; no HSM, guarding, ridigity, or rebound tenderness Ext: no edema, well perfused with 2+ pulses, Skin: no  rash or lesions noted Lymph: no LAD  Data: Lab Results  Component Value Date   WBC 5.9 05/14/2015   HGB 12.4* 05/14/2015   HCT 37.0* 05/14/2015   MCV 93.1 05/14/2015   PLT 109* 05/14/2015    Recent Labs Lab 05/12/15 1732 05/13/15 0047 05/14/15 0644  HGB 13.8 13.3 12.4*   Lab Results  Component Value Date   NA 138 05/14/2015   K 3.2* 05/14/2015   CL 106 05/14/2015   CO2 27 05/14/2015   BUN 8 05/14/2015   CREATININE 0.59* 05/14/2015   Lab Results  Component Value Date   ALT 49 05/12/2015   AST 43* 05/12/2015   ALKPHOS 61 05/12/2015   BILITOT 0.5 05/12/2015   No results for input(s): APTT, INR, PTT in the last 168 hours. Assessment/Plan: [name] is a 54 y.o. male with possible UGI bleeding. No bleeding since admission. No abdominal pain. Eating solids.  Recommendations: Stable for discharge. Continue daily PPI. Pt can f/u with [place] later as outpt if symptoms recur. Will sign off. Thanks. Please call with questions or concerns.  Scarlettrose Costilow, Lupita Dawn, MD

## 2015-05-14 NOTE — Progress Notes (Signed)
Inpatient Diabetes Program Recommendations  AACE/ADA: New Consensus Statement on Inpatient Glycemic Control (2015)  Target Ranges:  Prepandial:   less than 140 mg/dL      Peak postprandial:   less than 180 mg/dL (1-2 hours)      Critically ill patients:  140 - 180 mg/dL  Results for MALON, MONIN (MRN [id number]) as of 05/14/2015 08:20  Ref. Range 05/13/2015 00:19 05/13/2015 05:59 05/13/2015 11:49 05/13/2015 12:02 05/13/2015 17:54 05/13/2015 21:46 05/14/2015 05:17 05/14/2015 07:38  Glucose-Capillary Latest Ref Range: 65-99 mg/dL 232 (H) 296 (H) 458 (H) 500 (H) 142 (H) 150 (H) 320 (H) 287 (H)   Review of Glycemic Control  Outpatient Diabetes medications: Lantus 10 units BID Current orders for Inpatient glycemic control: Lantus 10 units daily, Novolog 0-15 units TID with meals, Novolog 0-5 units HS  Inpatient Diabetes Program Recommendations: Insulin - Basal: Please consider increasing Lantus to 15 units daily starting today. HgbA1C: A1C 12.1% on 05/13/15 indicating very poor glycemic control over the past 2-3 months.  Note: Patient is currently ordered clear liquid diet. Noted Madelyn Brunner, RN reported on 05/13/15 that patient is noncompliant with diet and patient was eating food yesterday evening.   Thanks, Barnie Alderman, RN, MSN, CDE Diabetes Coordinator Inpatient Diabetes Program [phone] (Team Pager from [place] to [place]) [phone] ([place] office) [phone] [facility] office) [phone] [facility] office)

## 2015-05-23 ENCOUNTER — Ambulatory Visit (INDEPENDENT_AMBULATORY_CARE_PROVIDER_SITE_OTHER): Payer: Medicaid Other | Admitting: Urology

## 2015-05-23 ENCOUNTER — Encounter: Payer: Self-pay | Admitting: Urology

## 2015-05-23 VITALS — BP 129/78 | HR 94 | Ht 68.0 in | Wt 153.0 lb

## 2015-05-23 DIAGNOSIS — N4283 Cyst of prostate: Secondary | ICD-10-CM

## 2015-05-23 DIAGNOSIS — N400 Enlarged prostate without lower urinary tract symptoms: Secondary | ICD-10-CM

## 2015-05-23 LAB — MICROSCOPIC EXAMINATION: BACTERIA UA: NONE SEEN

## 2015-05-23 LAB — URINALYSIS, COMPLETE
Bilirubin, UA: NEGATIVE
LEUKOCYTES UA: NEGATIVE
Nitrite, UA: NEGATIVE
PH UA: 5.5 (ref 5.0–7.5)
PROTEIN UA: NEGATIVE
Specific Gravity, UA: 1.025 (ref 1.005–1.030)
Urobilinogen, Ur: 0.2 mg/dL (ref 0.2–1.0)

## 2015-05-23 NOTE — Progress Notes (Signed)
05/23/2015 10:19 AM   [name] 11-05-1960 [id number]  Referring provider: [name], MD [place], [place] [address]  Chief Complaint  Patient presents with  . Wound Check    HPI: He is a 54 y.o. male who presented with abdominal pain and rectal bleeding. He had a CT as part of his evaluation and was noted to have an approximately 2cm cystic struction in the right posterior apical prostate with the possibility of a prostatic abscess noted. He has a prior history of epididymitis with some form of scrotal surgery and has had scrotal cellulitis and prior trich. He currently denies fever, perineal pain or voiding complaints. His UA this admission has 3 WBC's He is afebrile with a normal WBC and has not been on antibiotics. No urine culture has been done.   Interval history: The patient returns today and is currently asymptomatic from what is likely a chronic prostatic abscess. He denies any fevers, chills, nausea, or vomiting. He is urinating without difficulty or dysuria. Of note, he currently has diarrhea.   PMH: Past Medical History  Diagnosis Date  . Epididymoorchitis   . Diabetes ([place])   . Cellulitis of scrotum   . Difficulty urinating   . BPH without obstruction/lower urinary tract symptoms   . Trichomoniasis   . Cyst of prostate 05/13/2015    Surgical History: Past Surgical History  Procedure Laterality Date  . Resection of pancreas    . Scrotal exploration      Home Medications:    Medication List       This list is accurate as of: 05/23/15 10:19 AM.  Always use your most recent med list.               LANTUS SOLOSTAR 100 UNIT/ML Solostar Pen  Generic drug:  Insulin Glargine  Inject 10 Units into the skin 2 (two) times daily.     multivitamin with minerals Tabs tablet  Take 1 tablet by mouth daily.     NOVOLOG FLEXPEN 100 UNIT/ML FlexPen  Generic drug:  insulin aspart     Oxycodone HCl 10 MG Tabs  Take 10 mg by  mouth every 8 (eight) hours as needed (for pain.).     pantoprazole 40 MG injection  Commonly known as:  PROTONIX  Inject 40 mg into the vein every 12 (twelve) hours.        Allergies: No Known Allergies  Family History: Family History  Problem Relation Age of Onset  . Cancer Father     Social History:  reports that he has been smoking.  He does not have any smokeless tobacco history on file. He reports that he drinks alcohol. He reports that he does not use illicit drugs.  ROS: UROLOGY Frequent Urination?: No Hard to postpone urination?: No Burning/pain with urination?: No Get up at night to urinate?: No Leakage of urine?: No Urine stream starts and stops?: No Trouble starting stream?: No Do you have to strain to urinate?: No Blood in urine?: No Urinary tract infection?: No Sexually transmitted disease?: No Injury to kidneys or bladder?: No Painful intercourse?: No Weak stream?: Yes Erection problems?: No Penile pain?: No  Gastrointestinal Nausea?: No Vomiting?: No Indigestion/heartburn?: No Diarrhea?: Yes Constipation?: No  Constitutional Fever: No Night sweats?: Yes Weight loss?: Yes Fatigue?: Yes  Skin Skin rash/lesions?: No Itching?: Yes  Eyes Blurred vision?: Yes Double vision?: No  Ears/Nose/Throat Sore throat?: No Sinus problems?: No  Hematologic/Lymphatic Swollen glands?: No Easy bruising?:  No  Cardiovascular Leg swelling?: No Chest pain?: No  Respiratory Cough?: No Shortness of breath?: No  Endocrine Excessive thirst?: No  Musculoskeletal Back pain?: Yes Joint pain?: Yes  Neurological Headaches?: No Dizziness?: No  Psychologic Depression?: No Anxiety?: No  Physical Exam: BP 129/78 mmHg  Pulse 94  Ht 5\' 8"  (1.727 m)  Wt 153 lb (69.4 kg)  BMI 23.27 kg/m2  Constitutional:  Alert and oriented, No acute distress. HEENT: [place] AT, moist mucus membranes.  Trachea midline, no masses. Cardiovascular: No clubbing,  cyanosis, or edema. Respiratory: Normal respiratory effort, no increased work of breathing. GI: Abdomen is soft, nontender, nondistended, no abdominal masses GU: No CVA tenderness. DRE deferred secondary to diarrhea. Skin: No rashes, bruises or suspicious lesions. Lymph: No cervical or inguinal adenopathy. Neurologic: Grossly intact, no focal deficits, moving all 4 extremities. Psychiatric: Normal mood and affect.  Laboratory Data: Lab Results  Component Value Date   WBC 5.9 05/14/2015   HGB 12.4* 05/14/2015   HCT 37.0* 05/14/2015   MCV 93.1 05/14/2015   PLT 109* 05/14/2015    Lab Results  Component Value Date   CREATININE 0.59* 05/14/2015    No results found for: PSA  No results found for: TESTOSTERONE  Lab Results  Component Value Date   HGBA1C 11.8* 05/14/2015    Urinalysis    Component Value Date/Time   COLORURINE Straw 08/06/2014 0931   APPEARANCEUR Clear 08/06/2014 0931   LABSPEC 1.031 08/06/2014 0931   PHURINE 6.0 08/06/2014 0931   GLUCOSEU >=500 08/06/2014 0931   HGBUR 1+ 08/06/2014 0931   BILIRUBINUR Negative 08/06/2014 0931   KETONESUR Trace 08/06/2014 0931   PROTEINUR Negative 08/06/2014 0931   NITRITE Negative 08/06/2014 0931   LEUKOCYTESUR Trace 08/06/2014 0931    Pertinent Imaging: IMPRESSION: 1. Apparent bladder wall thickening could reflect mild cystitis, depending on the patient's symptoms. 2. Unusual new 2.7 x 1.8 cm collection of fluid noted within the right side of the prostate gland. Would correlate for history of prior surgery, to exclude an intraprostatic abscess. 3. Marked diffuse fatty infiltration within the liver. 1.3 cm focus of contrast blush at the posterior right hepatic lobe is stable from 2014 and likely benign. 4. Diffuse calcification within the pancreas reflects sequelae of chronic pancreatitis. 5. Mild calcification along the abdominal aorta and its branches. 6. Scattered diverticulosis along the ascending and  descending colon, without evidence of diverticulitis.  Assessment & Plan:    1. Chronic prostatic abscess The patient is currently asymptomatic from what is likely a chronic abscess seen on his most recent CT scan. This abscess was not present 2 years ago. As he is not symptomatic, there is no urgent indication for treatment of this abscess. We will see the patient back in 4-6 weeks to perform a transrectal ultrasound to ensure that this abscess is resolving. If it is not, we may consider transurethral unroofing of the abscess.   Return in about 6 weeks (around 07/04/2015) for with in office transrectal ultrasound.  Nickie Retort, MD  [facility] [address], [place], [place] [address] [phone]

## 2015-05-24 ENCOUNTER — Emergency Department: Payer: Medicaid Other

## 2015-05-24 ENCOUNTER — Emergency Department
Admission: EM | Admit: 2015-05-24 | Discharge: 2015-05-24 | Disposition: A | Discharge status: Other Acute Inpatient Hospital | Payer: Medicaid Other | Attending: Student | Admitting: Student

## 2015-05-24 DIAGNOSIS — E119 Type 2 diabetes mellitus without complications: Secondary | ICD-10-CM | POA: Diagnosis not present

## 2015-05-24 DIAGNOSIS — Z794 Long term (current) use of insulin: Secondary | ICD-10-CM | POA: Insufficient documentation

## 2015-05-24 DIAGNOSIS — S82871A Displaced pilon fracture of right tibia, initial encounter for closed fracture: Secondary | ICD-10-CM | POA: Diagnosis not present

## 2015-05-24 DIAGNOSIS — Y9389 Activity, other specified: Secondary | ICD-10-CM | POA: Diagnosis not present

## 2015-05-24 DIAGNOSIS — Z79899 Other long term (current) drug therapy: Secondary | ICD-10-CM | POA: Diagnosis not present

## 2015-05-24 DIAGNOSIS — Y998 Other external cause status: Secondary | ICD-10-CM | POA: Diagnosis not present

## 2015-05-24 DIAGNOSIS — S82831A Other fracture of upper and lower end of right fibula, initial encounter for closed fracture: Secondary | ICD-10-CM | POA: Diagnosis not present

## 2015-05-24 DIAGNOSIS — R52 Pain, unspecified: Secondary | ICD-10-CM

## 2015-05-24 DIAGNOSIS — Y92410 Unspecified street and highway as the place of occurrence of the external cause: Secondary | ICD-10-CM | POA: Diagnosis not present

## 2015-05-24 DIAGNOSIS — F17200 Nicotine dependence, unspecified, uncomplicated: Secondary | ICD-10-CM | POA: Diagnosis not present

## 2015-05-24 DIAGNOSIS — S8991XA Unspecified injury of right lower leg, initial encounter: Secondary | ICD-10-CM | POA: Diagnosis present

## 2015-05-24 LAB — CBC WITH DIFFERENTIAL/PLATELET
BASOS ABS: 0 10*3/uL (ref 0–0.1)
Basophils Relative: 0 %
EOS ABS: 0.3 10*3/uL (ref 0–0.7)
EOS PCT: 5 %
HCT: 40.9 % (ref 40.0–52.0)
Hemoglobin: 13.9 g/dL (ref 13.0–18.0)
Lymphocytes Relative: 34 %
Lymphs Abs: 2.1 10*3/uL (ref 1.0–3.6)
MCH: 31.9 pg (ref 26.0–34.0)
MCHC: 33.9 g/dL (ref 32.0–36.0)
MCV: 93.9 fL (ref 80.0–100.0)
Monocytes Absolute: 0.5 10*3/uL (ref 0.2–1.0)
Monocytes Relative: 8 %
Neutro Abs: 3.2 10*3/uL (ref 1.4–6.5)
Neutrophils Relative %: 53 %
PLATELETS: 199 10*3/uL (ref 150–440)
RBC: 4.35 MIL/uL — AB (ref 4.40–5.90)
RDW: 14.4 % (ref 11.5–14.5)
WBC: 6 10*3/uL (ref 3.8–10.6)

## 2015-05-24 LAB — BASIC METABOLIC PANEL
ANION GAP: 12 (ref 5–15)
BUN: 6 mg/dL (ref 6–20)
CO2: 24 mmol/L (ref 22–32)
Calcium: 9.4 mg/dL (ref 8.9–10.3)
Chloride: 101 mmol/L (ref 101–111)
Creatinine, Ser: 0.59 mg/dL — ABNORMAL LOW (ref 0.61–1.24)
GFR calc Af Amer: >60 mL/min (ref >60)
Glucose, Bld: 310 mg/dL — ABNORMAL HIGH (ref 65–99)
POTASSIUM: 3.6 mmol/L (ref 3.5–5.1)
SODIUM: 137 mmol/L (ref 135–145)

## 2015-05-24 LAB — ETHANOL: Alcohol, Ethyl (B): 231 mg/dL — ABNORMAL HIGH (ref <5)

## 2015-05-24 LAB — PROTIME-INR
INR: 0.99
PROTHROMBIN TIME: 13.3 s (ref 11.4–15.0)

## 2015-05-24 LAB — APTT: APTT: 29 s (ref 24–36)

## 2015-05-24 LAB — TYPE AND SCREEN
ABO/RH(D): A POS
Antibody Screen: NEGATIVE

## 2015-05-24 MED ORDER — ONDANSETRON HCL 4 MG/2ML IJ SOLN
4.0000 mg | Freq: Once | INTRAMUSCULAR | Status: AC
  Administered 2015-05-24: 4 mg via INTRAVENOUS
  Filled 2015-05-24: qty 2

## 2015-05-24 MED ORDER — SODIUM CHLORIDE 0.9 % IV BOLUS (SEPSIS)
500.0000 mL | Freq: Once | INTRAVENOUS | Status: AC
Start: 2015-05-24 — End: 2015-05-24
  Administered 2015-05-24: 500 mL via INTRAVENOUS

## 2015-05-24 MED ORDER — MORPHINE SULFATE (PF) 4 MG/ML IV SOLN
INTRAVENOUS | Status: AC
  Administered 2015-05-24: 4 mg via INTRAVENOUS
  Filled 2015-05-24: qty 1

## 2015-05-24 MED ORDER — MORPHINE SULFATE (PF) 4 MG/ML IV SOLN
4.0000 mg | Freq: Once | INTRAVENOUS | Status: AC
  Administered 2015-05-24: 4 mg via INTRAVENOUS

## 2015-05-24 MED ORDER — MORPHINE SULFATE (PF) 4 MG/ML IV SOLN
4.0000 mg | Freq: Once | INTRAVENOUS | Status: AC
  Administered 2015-05-24: 4 mg via INTRAVENOUS
  Filled 2015-05-24: qty 1

## 2015-05-24 NOTE — ED Notes (Signed)
Resumed care from [place]. Pt is mumbling and difficult to understand. States his pain is 10/10. NAD noted at this time.
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
## 2015-05-25 LAB — URINE CULTURE

## 2015-05-28 ENCOUNTER — Telehealth: Payer: Self-pay | Admitting: Urology

## 2015-05-28 NOTE — Telephone Encounter (Signed)
-----   Message from Jarvis Morgan sent at 05/28/2015 11:33 AM EST ----- Regarding: Results ready for review The following results are ready for review:  Urinalysis, complete Urine culture Microscopic examination

## 2015-05-30 ENCOUNTER — Ambulatory Visit: Payer: Medicaid Other

## 2015-06-05 DIAGNOSIS — S82401A Unspecified fracture of shaft of right fibula, initial encounter for closed fracture: Secondary | ICD-10-CM

## 2015-06-05 DIAGNOSIS — E1165 Type 2 diabetes mellitus with hyperglycemia: Secondary | ICD-10-CM | POA: Insufficient documentation

## 2015-06-05 DIAGNOSIS — S82201A Unspecified fracture of shaft of right tibia, initial encounter for closed fracture: Secondary | ICD-10-CM | POA: Insufficient documentation

## 2015-07-05 ENCOUNTER — Ambulatory Visit: Payer: Medicaid Other | Admitting: Urology

## 2015-07-05 ENCOUNTER — Encounter: Payer: Self-pay | Admitting: Urology

## 2015-07-05 DIAGNOSIS — F17200 Nicotine dependence, unspecified, uncomplicated: Secondary | ICD-10-CM | POA: Insufficient documentation

## 2015-07-05 DIAGNOSIS — Z9119 Patient's noncompliance with other medical treatment and regimen: Secondary | ICD-10-CM | POA: Insufficient documentation

## 2015-07-05 DIAGNOSIS — Z91199 Patient's noncompliance with other medical treatment and regimen due to unspecified reason: Secondary | ICD-10-CM | POA: Insufficient documentation

## 2015-09-21 ENCOUNTER — Emergency Department
Admission: EM | Admit: 2015-09-21 | Discharge: 2015-09-21 | Disposition: A | Discharge status: Home / Self Care | Payer: Medicaid Other | Attending: Emergency Medicine | Admitting: Emergency Medicine

## 2015-09-21 ENCOUNTER — Encounter: Payer: Self-pay | Admitting: Emergency Medicine

## 2015-09-21 DIAGNOSIS — Z794 Long term (current) use of insulin: Secondary | ICD-10-CM | POA: Insufficient documentation

## 2015-09-21 DIAGNOSIS — Y998 Other external cause status: Secondary | ICD-10-CM | POA: Insufficient documentation

## 2015-09-21 DIAGNOSIS — Z79899 Other long term (current) drug therapy: Secondary | ICD-10-CM | POA: Diagnosis not present

## 2015-09-21 DIAGNOSIS — E119 Type 2 diabetes mellitus without complications: Secondary | ICD-10-CM | POA: Diagnosis not present

## 2015-09-21 DIAGNOSIS — Y939 Activity, unspecified: Secondary | ICD-10-CM | POA: Insufficient documentation

## 2015-09-21 DIAGNOSIS — Z8719 Personal history of other diseases of the digestive system: Secondary | ICD-10-CM | POA: Diagnosis not present

## 2015-09-21 DIAGNOSIS — F17210 Nicotine dependence, cigarettes, uncomplicated: Secondary | ICD-10-CM | POA: Diagnosis not present

## 2015-09-21 DIAGNOSIS — M62830 Muscle spasm of back: Secondary | ICD-10-CM | POA: Diagnosis present

## 2015-09-21 DIAGNOSIS — Y92410 Unspecified street and highway as the place of occurrence of the external cause: Secondary | ICD-10-CM | POA: Diagnosis not present

## 2015-09-21 DIAGNOSIS — N492 Inflammatory disorders of scrotum: Secondary | ICD-10-CM | POA: Insufficient documentation

## 2015-09-21 DIAGNOSIS — N400 Enlarged prostate without lower urinary tract symptoms: Secondary | ICD-10-CM | POA: Insufficient documentation

## 2015-09-21 MED ORDER — CYCLOBENZAPRINE HCL 5 MG PO TABS: 5.0000 mg | ORAL_TABLET | Freq: Three times a day (TID) | ORAL | Status: DC | PRN
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
## 2015-11-13 ENCOUNTER — Ambulatory Visit: Payer: Medicaid Other | Attending: Internal Medicine | Admitting: Physical Therapy

## 2015-11-13 DIAGNOSIS — M542 Cervicalgia: Secondary | ICD-10-CM | POA: Diagnosis present

## 2015-11-13 NOTE — Patient Instructions (Signed)
All exercises provided were adapted from [email or web address]. Patient was provided a written handout with pictures as described. Any additional cues were manually entered in to handout and copied in to this document.  SCAPULAR RETRACTIONS  Draw your shoulder blades back and down.    CERVICAL TOWEL ROTATION STRETCH  Hold the ends of a small folded bath towel and wrap it around your head and neck as shown. Place the towel on your face so as to minimize placing pressure on your jaw. Pressure should be placed on the side of your face/cheek bone.   Use your bottom most arm to anchor the towel in place. Use your top most arm to pull the towel to cause a gentle rotational stretch in your neck. Hold, then return to starting position and repeat.   Seated Thoracic Extension  Sit with your back against the top of a chair or with foam roll/noodle behind and perpendicular to your spine. Support your neck with your hands and tuck elbows forward and together. Gently arch back to mobilize spine at level where chair contacts spine. Hold 1-2 seconds then slowly return. Repeat. Repeat at 3 separate levels throughout mid-back, starting down near bottom of ribs and moving up toward top of shoulder blades.
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
## 2016-04-04 ENCOUNTER — Emergency Department: Payer: Medicaid Other

## 2016-04-04 ENCOUNTER — Encounter: Payer: Self-pay | Admitting: Emergency Medicine

## 2016-04-04 ENCOUNTER — Emergency Department
Admission: EM | Admit: 2016-04-04 | Discharge: 2016-04-04 | Disposition: A | Discharge status: Home / Self Care | Payer: Medicaid Other | Attending: Emergency Medicine | Admitting: Emergency Medicine

## 2016-04-04 DIAGNOSIS — F17200 Nicotine dependence, unspecified, uncomplicated: Secondary | ICD-10-CM | POA: Insufficient documentation

## 2016-04-04 DIAGNOSIS — I10 Essential (primary) hypertension: Secondary | ICD-10-CM | POA: Insufficient documentation

## 2016-04-04 DIAGNOSIS — Z794 Long term (current) use of insulin: Secondary | ICD-10-CM | POA: Insufficient documentation

## 2016-04-04 DIAGNOSIS — E119 Type 2 diabetes mellitus without complications: Secondary | ICD-10-CM | POA: Diagnosis not present

## 2016-04-04 DIAGNOSIS — R1013 Epigastric pain: Secondary | ICD-10-CM | POA: Diagnosis present

## 2016-04-04 HISTORY — DX: Acute pancreatitis without necrosis or infection, unspecified: K85.90

## 2016-04-04 LAB — URINALYSIS COMPLETE WITH MICROSCOPIC (ARMC ONLY)
BACTERIA UA: NONE SEEN
BILIRUBIN URINE: NEGATIVE
Glucose, UA: >500 mg/dL — AB
Ketones, ur: NEGATIVE mg/dL
LEUKOCYTES UA: NEGATIVE
NITRITE: NEGATIVE
PH: 6 (ref 5.0–8.0)
Protein, ur: NEGATIVE mg/dL
Specific Gravity, Urine: 1.025 (ref 1.005–1.030)

## 2016-04-04 LAB — COMPREHENSIVE METABOLIC PANEL
ALBUMIN: 4.1 g/dL (ref 3.5–5.0)
ALT: 45 U/L (ref 17–63)
AST: 67 U/L — AB (ref 15–41)
Alkaline Phosphatase: 96 U/L (ref 38–126)
Anion gap: 9 (ref 5–15)
BUN: 12 mg/dL (ref 6–20)
CHLORIDE: 100 mmol/L — AB (ref 101–111)
CO2: 24 mmol/L (ref 22–32)
Calcium: 9.3 mg/dL (ref 8.9–10.3)
Creatinine, Ser: 1.16 mg/dL (ref 0.61–1.24)
GFR calc Af Amer: >60 mL/min (ref >60)
GFR calc non Af Amer: >60 mL/min (ref >60)
GLUCOSE: 551 mg/dL — AB (ref 65–99)
POTASSIUM: 3.4 mmol/L — AB (ref 3.5–5.1)
SODIUM: 133 mmol/L — AB (ref 135–145)
Total Bilirubin: 0.8 mg/dL (ref 0.3–1.2)
Total Protein: 7.5 g/dL (ref 6.5–8.1)

## 2016-04-04 LAB — CBC
HEMATOCRIT: 38.7 % — AB (ref 40.0–52.0)
Hemoglobin: 13.2 g/dL (ref 13.0–18.0)
MCH: 31.7 pg (ref 26.0–34.0)
MCHC: 34 g/dL (ref 32.0–36.0)
MCV: 93 fL (ref 80.0–100.0)
Platelets: 155 10*3/uL (ref 150–440)
RBC: 4.16 MIL/uL — ABNORMAL LOW (ref 4.40–5.90)
RDW: 13.2 % (ref 11.5–14.5)
WBC: 5.2 10*3/uL (ref 3.8–10.6)

## 2016-04-04 LAB — GLUCOSE, CAPILLARY: Glucose-Capillary: 384 mg/dL — ABNORMAL HIGH (ref 65–99)

## 2016-04-04 LAB — LIPASE, BLOOD: LIPASE: 31 U/L (ref 11–51)

## 2016-04-04 MED ORDER — TRAMADOL HCL 50 MG PO TABS: 50.0000 mg | ORAL_TABLET | Freq: Four times a day (QID) | ORAL | 0 refills | Status: AC | PRN

## 2016-04-04 MED ORDER — ONDANSETRON HCL 4 MG PO TABS: 4.0000 mg | ORAL_TABLET | Freq: Every day | ORAL | 0 refills | Status: DC | PRN

## 2016-04-04 MED ORDER — SODIUM CHLORIDE 0.9 % IV BOLUS (SEPSIS): 1000.0000 mL | Freq: Once | INTRAVENOUS | Status: DC

## 2016-04-04 MED ORDER — MORPHINE SULFATE (PF) 4 MG/ML IV SOLN
4.0000 mg | Freq: Once | INTRAVENOUS | Status: AC
Start: 2016-04-04 — End: 2016-04-04
  Administered 2016-04-04: 4 mg via INTRAVENOUS
  Filled 2016-04-04: qty 1

## 2016-04-04 MED ORDER — IOPAMIDOL (ISOVUE-300) INJECTION 61%
100.0000 mL | Freq: Once | INTRAVENOUS | Status: AC | PRN
  Administered 2016-04-04: 100 mL via INTRAVENOUS
  Filled 2016-04-04: qty 100

## 2016-04-04 MED ORDER — IOPAMIDOL (ISOVUE-300) INJECTION 61%
30.0000 mL | Freq: Once | INTRAVENOUS | Status: AC | PRN
  Administered 2016-04-04: 30 mL via ORAL
  Filled 2016-04-04: qty 30

## 2016-04-04 MED ORDER — SODIUM CHLORIDE 0.9 % IV BOLUS (SEPSIS)
1000.0000 mL | Freq: Once | INTRAVENOUS | Status: AC
  Administered 2016-04-04: 1000 mL via INTRAVENOUS

## 2016-04-04 NOTE — ED Notes (Signed)
CT called and informed of patient being done with his contrast.

## 2016-04-04 NOTE — ED Provider Notes (Signed)
[facility] Emergency Department Provider Note  ____________________________________________   I have reviewed the triage vital signs and the nursing notes.   HISTORY  Chief Complaint Abdominal Pain    HPI [name] is a 55 y.o. male with a history of chronic abdominal pain recurrent pancreatitis, who is no longer drinking alcohol abuse he very poorly controlled diabetes patient with a normal glucose level between 3 and 400 when he comes in, patient states that he has been having epigastric abdominal pain for months off and on, worse over the last several weeks. 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 He denies fever or chills. He states sometimes he vomits. He does have occasional diarrhea as well. He denies any fever or chills. No melena bright red blood per rectum. He states he has lost some weight over the last several months. He states he is sometimes in compliance with his insulin. The pain is diffuse and cramping, sometimes in the epigastric region, denies chest pain or short of breath, nothing makes it better and nothing makes it worse.     Past Medical History:  Diagnosis Date  . BPH without obstruction/lower urinary tract symptoms   . Cellulitis of scrotum   . Cyst of prostate 05/13/2015  . Diabetes ([place])   . Difficulty urinating   . Epididymoorchitis   . Hypertension   . Pancreatitis   . Trichomoniasis     Patient Active Problem List   Diagnosis Date Noted  . Cyst of prostate 05/13/2015  . GI bleed 05/12/2015  . Type 2 diabetes mellitus ([place]) 05/12/2015  . GERD (gastroesophageal reflux disease) 05/12/2015  . ETOH abuse 05/12/2015  . BPH (benign prostatic hyperplasia) 05/12/2015    Past Surgical History:  Procedure Laterality Date  . resection of pancreas    . SCROTAL EXPLORATION      Prior to Admission medications   Medication Sig Start Date End Date Taking? Authorizing Provider  LANTUS SOLOSTAR 100 UNIT/ML Solostar Pen Inject  10 Units into the skin 2 (two) times daily.  01/15/15  Yes Historical Provider, MD  NOVOLOG FLEXPEN 100 UNIT/ML FlexPen Inject 48 Units into the skin at bedtime.  05/16/15  Yes Historical Provider, MD  Oxycodone HCl 10 MG TABS Take 10 mg by mouth every 8 (eight) hours as needed (for pain.).  02/10/15  Yes Historical Provider, MD  pregabalin (LYRICA) 25 MG capsule Take 1 capsule by mouth 3 (three) times daily. 02/21/16  Yes Historical Provider, MD    Allergies Review of patient's allergies indicates no known allergies.  Family History  Problem Relation Age of Onset  . Cancer Father     Social History Social History  Substance Use Topics  . Smoking status: Current Every Day Smoker    Packs/day: 0.00  . Smokeless tobacco: Never Used  . Alcohol use No     Comment: today 05/12/15    Review of Systems Constitutional: No fever/chills Eyes: No visual changes. ENT: No sore throat. No stiff neck no neck pain Cardiovascular: Denies chest pain. Respiratory: Denies shortness of breath. Gastrointestinal:   no vomiting.  No diarrhea.  No constipation. Genitourinary: Negative for dysuria. Musculoskeletal: Negative lower extremity swelling Skin: Negative for rash. Neurological: Negative for severe headaches, focal weakness or numbness. 10-point ROS otherwise negative.  ____________________________________________   PHYSICAL EXAM:  VITAL SIGNS: ED Triage Vitals  Enc Vitals Group     BP 04/04/16 1207 (!) 142/90     Pulse Rate 04/04/16 1207 96  Resp 04/04/16 1207 16     Temp 04/04/16 1207 97.6 F (36.4 C)     Temp Source 04/04/16 1207 Oral     SpO2 04/04/16 1207 100 %     Weight 04/04/16 1208 150 lb (68 kg)     Height 04/04/16 1208 5\' 8"  (1.727 m)     Head Circumference --      Peak Flow --      Pain Score 04/04/16 1208 9     Pain Loc --      Pain Edu? --      Excl. in [place]? --     Constitutional: Alert and oriented. Well appearing and in no acute distress. Eyes: Conjunctivae  are normal. PERRL. EOMI. Head: Atraumatic. Nose: No congestion/rhinnorhea. Mouth/Throat: Mucous membranes are moist.  Oropharynx non-erythematous. Neck: No stridor.   Nontender with no meningismus Cardiovascular: Normal rate, regular rhythm. Grossly normal heart sounds.  Good peripheral circulation. Respiratory: Normal respiratory effort.  No retractions. Lungs CTAB. Abdominal: Soft and Minimal tenderness to palpation in epigastric region. No distention. No guarding no rebound Back:  There is no focal tenderness or step off.  there is no midline tenderness there are no lesions noted. there is no CVA tenderness Musculoskeletal: No lower extremity tenderness, no upper extremity tenderness. No joint effusions, no DVT signs strong distal pulses no edema Neurologic:  Normal speech and language. No gross focal neurologic deficits are appreciated.  Skin:  Skin is warm, dry and intact. No rash noted. Psychiatric: Mood and affect are normal. Speech and behavior are normal.  ____________________________________________   LABS (all labs ordered are listed, but only abnormal results are displayed)  Labs Reviewed  COMPREHENSIVE METABOLIC PANEL - Abnormal; Notable for the following:       Result Value   Sodium 133 (*)    Potassium 3.4 (*)    Chloride 100 (*)    Glucose, Bld 551 (*)    AST 67 (*)    All other components within normal limits  CBC - Abnormal; Notable for the following:    RBC 4.16 (*)    HCT 38.7 (*)    All other components within normal limits  URINALYSIS COMPLETEWITH MICROSCOPIC ([facility] ONLY) - Abnormal; Notable for the following:    Color, Urine STRAW (*)    APPearance CLEAR (*)    Glucose, UA >500 (*)    Hgb urine dipstick 2+ (*)    Squamous Epithelial / LPF 0-5 (*)    All other components within normal limits  GLUCOSE, CAPILLARY - Abnormal; Notable for the following:    Glucose-Capillary 384 (*)    All other components within normal limits  LIPASE, BLOOD    ____________________________________________  EKG  I personally interpreted any EKGs ordered by me or triage  ____________________________________________  RADIOLOGY  I reviewed any imaging ordered by me or triage that were performed during my shift and, if possible, patient and/or family made aware of any abnormal findings. ____________________________________________   PROCEDURES  Procedure(s) performed: None  Procedures  Critical Care performed: None  ____________________________________________   INITIAL IMPRESSION / ASSESSMENT AND PLAN / ED COURSE  Pertinent labs & imaging results that were available during my care of the patient were reviewed by me and considered in my medical decision making (see chart for details).  Patient with chronic nausea vomiting and diarrhea presents today with epigastric abdominal discomfort present in there for "months". He did state that he lost weight during this process, it is not clear how much  weight. I did elect therefore did do a CT scan although at this time there is very little evidence of acute pathology. CT scan is negative chest x-ray is negative as well. Nothing to suggest ACS PE or dissection for this pain. Patient is feeling much better after IV fluids. His sugars were elevated but this is not uncommon for him. There is no evidence of DKA. We have given him IV fluids and sugars are coming down to more towards his normal, poorly controlled, level. At this time, I do not see indication for acute admission to the hospital. We will refer him to GI medicine as well as his primary care doctor. He will return to the emergency room for any new or worrisome symptoms.  Clinical Course   ____________________________________________   FINAL CLINICAL IMPRESSION(S) / ED DIAGNOSES  Final diagnoses:  None      This chart was dictated using voice recognition software.  Despite best efforts to proofread,  errors can occur which can  change meaning.      Schuyler Amor, MD 04/04/16 [phone]

## 2016-04-04 NOTE — ED Triage Notes (Signed)
Says pancreas is bothering him .  Also diarrhea--says 3 weeks.

## 2016-04-17 DIAGNOSIS — K859 Acute pancreatitis without necrosis or infection, unspecified: Secondary | ICD-10-CM | POA: Insufficient documentation

## 2016-04-29 ENCOUNTER — Other Ambulatory Visit: Payer: Self-pay | Admitting: Gastroenterology

## 2016-04-29 DIAGNOSIS — R1310 Dysphagia, unspecified: Secondary | ICD-10-CM

## 2016-05-07 ENCOUNTER — Ambulatory Visit
Admission: RE | Admit: 2016-05-07 | Discharge: 2016-05-07 | Disposition: A | Discharge status: Home / Self Care | Payer: Medicaid Other | Source: Ambulatory Visit | Attending: Gastroenterology | Admitting: Gastroenterology

## 2016-07-02 ENCOUNTER — Emergency Department
Admission: EM | Admit: 2016-07-02 | Discharge: 2016-07-02 | Disposition: A | Discharge status: Home / Self Care | Payer: Medicaid Other | Attending: Emergency Medicine | Admitting: Emergency Medicine

## 2016-07-02 ENCOUNTER — Encounter: Payer: Self-pay | Admitting: Emergency Medicine

## 2016-07-02 DIAGNOSIS — R109 Unspecified abdominal pain: Secondary | ICD-10-CM | POA: Diagnosis present

## 2016-07-02 DIAGNOSIS — I10 Essential (primary) hypertension: Secondary | ICD-10-CM | POA: Diagnosis not present

## 2016-07-02 DIAGNOSIS — Z79899 Other long term (current) drug therapy: Secondary | ICD-10-CM | POA: Insufficient documentation

## 2016-07-02 DIAGNOSIS — E1165 Type 2 diabetes mellitus with hyperglycemia: Secondary | ICD-10-CM | POA: Insufficient documentation

## 2016-07-02 DIAGNOSIS — R197 Diarrhea, unspecified: Secondary | ICD-10-CM | POA: Insufficient documentation

## 2016-07-02 DIAGNOSIS — R739 Hyperglycemia, unspecified: Secondary | ICD-10-CM

## 2016-07-02 DIAGNOSIS — F17200 Nicotine dependence, unspecified, uncomplicated: Secondary | ICD-10-CM | POA: Insufficient documentation

## 2016-07-02 LAB — COMPREHENSIVE METABOLIC PANEL
ALBUMIN: 3.9 g/dL (ref 3.5–5.0)
ALK PHOS: 94 U/L (ref 38–126)
ALT: 42 U/L (ref 17–63)
ANION GAP: 10 (ref 5–15)
AST: 66 U/L — ABNORMAL HIGH (ref 15–41)
BILIRUBIN TOTAL: 0.8 mg/dL (ref 0.3–1.2)
BUN: 15 mg/dL (ref 6–20)
CALCIUM: 8.6 mg/dL — AB (ref 8.9–10.3)
CO2: 20 mmol/L — ABNORMAL LOW (ref 22–32)
Chloride: 92 mmol/L — ABNORMAL LOW (ref 101–111)
Creatinine, Ser: 1.26 mg/dL — ABNORMAL HIGH (ref 0.61–1.24)
GFR calc Af Amer: >60 mL/min (ref >60)
GLUCOSE: 1001 mg/dL — AB (ref 65–99)
POTASSIUM: 3.3 mmol/L — AB (ref 3.5–5.1)
Sodium: 122 mmol/L — ABNORMAL LOW (ref 135–145)
TOTAL PROTEIN: 7.4 g/dL (ref 6.5–8.1)

## 2016-07-02 LAB — URINALYSIS, COMPLETE (UACMP) WITH MICROSCOPIC
Bacteria, UA: NONE SEEN
Bilirubin Urine: NEGATIVE
KETONES UR: NEGATIVE mg/dL
LEUKOCYTES UA: NEGATIVE
NITRITE: NEGATIVE
PH: 6 (ref 5.0–8.0)
PROTEIN: NEGATIVE mg/dL
Specific Gravity, Urine: 1.024 (ref 1.005–1.030)
Squamous Epithelial / LPF: NONE SEEN

## 2016-07-02 LAB — GASTROINTESTINAL PANEL BY PCR, STOOL (REPLACES STOOL CULTURE)
ADENOVIRUS F40/41: NOT DETECTED
ASTROVIRUS: NOT DETECTED
CRYPTOSPORIDIUM: NOT DETECTED
CYCLOSPORA CAYETANENSIS: NOT DETECTED
Campylobacter species: NOT DETECTED
ENTEROAGGREGATIVE E COLI (EAEC): NOT DETECTED
ENTEROPATHOGENIC E COLI (EPEC): NOT DETECTED
ENTEROTOXIGENIC E COLI (ETEC): NOT DETECTED
Entamoeba histolytica: NOT DETECTED
GIARDIA LAMBLIA: NOT DETECTED
Norovirus GI/GII: NOT DETECTED
Plesimonas shigelloides: NOT DETECTED
Rotavirus A: NOT DETECTED
Salmonella species: NOT DETECTED
Sapovirus (I, II, IV, and V): NOT DETECTED
Shiga like toxin producing E coli (STEC): NOT DETECTED
Shigella/Enteroinvasive E coli (EIEC): NOT DETECTED
VIBRIO CHOLERAE: NOT DETECTED
VIBRIO SPECIES: NOT DETECTED
YERSINIA ENTEROCOLITICA: NOT DETECTED

## 2016-07-02 LAB — CBC
HEMATOCRIT: 36.5 % — AB (ref 40.0–52.0)
HEMOGLOBIN: 12.2 g/dL — AB (ref 13.0–18.0)
MCH: 31.5 pg (ref 26.0–34.0)
MCHC: 33.3 g/dL (ref 32.0–36.0)
MCV: 94.7 fL (ref 80.0–100.0)
Platelets: 130 10*3/uL — ABNORMAL LOW (ref 150–440)
RBC: 3.86 MIL/uL — ABNORMAL LOW (ref 4.40–5.90)
RDW: 13.3 % (ref 11.5–14.5)
WBC: 3.5 10*3/uL — AB (ref 3.8–10.6)

## 2016-07-02 LAB — GLUCOSE, CAPILLARY
GLUCOSE-CAPILLARY: 386 mg/dL — AB (ref 65–99)
Glucose-Capillary: >600 mg/dL (ref 65–99)

## 2016-07-02 LAB — C DIFFICILE QUICK SCREEN W PCR REFLEX
C Diff antigen: NEGATIVE
C Diff interpretation: NOT DETECTED
C Diff toxin: NEGATIVE

## 2016-07-02 LAB — TROPONIN I

## 2016-07-02 LAB — LIPASE, BLOOD: Lipase: 26 U/L (ref 11–51)

## 2016-07-02 MED ORDER — SODIUM CHLORIDE 0.9 % IV BOLUS (SEPSIS)
1000.0000 mL | Freq: Once | INTRAVENOUS | Status: AC
  Administered 2016-07-02: 1000 mL via INTRAVENOUS

## 2016-07-02 MED ORDER — DICYCLOMINE HCL 10 MG PO CAPS
20.0000 mg | ORAL_CAPSULE | Freq: Once | ORAL | Status: AC
  Administered 2016-07-02: 20 mg via ORAL
  Filled 2016-07-02: qty 2

## 2016-07-02 MED ORDER — INSULIN ASPART 100 UNIT/ML ~~LOC~~ SOLN
10.0000 [IU] | Freq: Once | SUBCUTANEOUS | Status: AC
  Administered 2016-07-02: 10 [IU] via INTRAVENOUS
  Filled 2016-07-02: qty 10

## 2016-07-02 MED ORDER — LANTUS SOLOSTAR 100 UNIT/ML ~~LOC~~ SOPN: 10.0000 [IU] | PEN_INJECTOR | Freq: Two times a day (BID) | SUBCUTANEOUS | 1 refills | Status: DC

## 2016-07-02 MED ORDER — LOPERAMIDE HCL 2 MG PO TABS: 2.0000 mg | ORAL_TABLET | Freq: Four times a day (QID) | ORAL | 0 refills | Status: DC | PRN

## 2016-07-02 NOTE — ED Triage Notes (Signed)
Pt c/o diarrhea for 2 months. Has had vomiting last 2 days. Reports blood sugars have been elevated; he last checked Friday and was over 300 per pt. C/o weakness and has been falling for last 3 weeks.  Has been losing weight. C/o left abdominal pain. No vomiting today.

## 2016-07-02 NOTE — ED Provider Notes (Signed)
[facility] Emergency Department Provider Note  ____________________________________________   First MD Initiated Contact with Patient 07/02/16 1230     (approximate)  I have reviewed the triage vital signs and the nursing notes.   HISTORY  Chief Complaint Diarrhea   HPI [name] is a 56 y.o. male history of diabetes as well as hypertension is present in the emergency department today with 1 month of diarrhea as well as crampy abdominal pain. He says that he is up to 10 episodes of diarrhea per day. He denies any recent antibiotic use. He is also complaining of intermittent abdominal cramping which is diffuse. Denies any burning with urination. Says that he is out of his insulin at home and recently had some fried potatoes which make contribute to his elevated blood sugar. Denies any nausea or vomiting.   Past Medical History:  Diagnosis Date  . BPH without obstruction/lower urinary tract symptoms   . Cellulitis of scrotum   . Cyst of prostate 05/13/2015  . Diabetes ([place])   . Difficulty urinating   . Epididymoorchitis   . Hypertension   . Pancreatitis   . Trichomoniasis     Patient Active Problem List   Diagnosis Date Noted  . Cyst of prostate 05/13/2015  . GI bleed 05/12/2015  . Type 2 diabetes mellitus ([place]) 05/12/2015  . GERD (gastroesophageal reflux disease) 05/12/2015  . ETOH abuse 05/12/2015  . BPH (benign prostatic hyperplasia) 05/12/2015    Past Surgical History:  Procedure Laterality Date  . resection of pancreas    . SCROTAL EXPLORATION      Prior to Admission medications   Medication Sig Start Date End Date Taking? Authorizing Provider  LANTUS SOLOSTAR 100 UNIT/ML Solostar Pen Inject 10 Units into the skin 2 (two) times daily.  01/15/15   Historical Provider, MD  NOVOLOG FLEXPEN 100 UNIT/ML FlexPen Inject 48 Units into the skin at bedtime.  05/16/15   Historical Provider, MD  ondansetron (ZOFRAN) 4 MG tablet Take 1 tablet  (4 mg total) by mouth daily as needed for nausea or vomiting. 04/04/16   Schuyler Amor, MD  Oxycodone HCl 10 MG TABS Take 10 mg by mouth every 8 (eight) hours as needed (for pain.).  02/10/15   Historical Provider, MD  pregabalin (LYRICA) 25 MG capsule Take 1 capsule by mouth 3 (three) times daily. 02/21/16   Historical Provider, MD    Allergies Patient has no known allergies.  Family History  Problem Relation Age of Onset  . Cancer Father     Social History Social History  Substance Use Topics  . Smoking status: Current Every Day Smoker    Packs/day: 0.00  . Smokeless tobacco: Never Used  . Alcohol use No     Comment: today 05/12/15    Review of Systems Constitutional: No fever/chills Eyes: No visual changes. ENT: No sore throat. Cardiovascular: Denies chest pain. Respiratory: Denies shortness of breath. Gastrointestinal:  No nausea, no vomiting.  o constipation. Genitourinary: Negative for dysuria. Musculoskeletal: Negative for back pain. Skin: Negative for rash. Neurological: Negative for headaches, focal weakness or numbness.  10-point ROS otherwise negative.  ____________________________________________   PHYSICAL EXAM:  VITAL SIGNS: ED Triage Vitals  Enc Vitals Group     BP 07/02/16 1024 114/69     Pulse Rate 07/02/16 1024 92     Resp 07/02/16 1024 18     Temp 07/02/16 1024 97.4 F (36.3 C)     Temp Source 07/02/16 1024 Oral  SpO2 07/02/16 1024 100 %     Weight 07/02/16 1022 125 lb (56.7 kg)     Height 07/02/16 1022 5\' 8"  (1.727 m)     Head Circumference --      Peak Flow --      Pain Score 07/02/16 1022 10     Pain Loc --      Pain Edu? --      Excl. in [place]? --     Constitutional: Alert and oriented. Well appearing and in no acute distress. Eyes: Conjunctivae are normal. PERRL. EOMI. Head: Atraumatic. Nose: No congestion/rhinnorhea. Mouth/Throat: Mucous membranes are moist.   Neck: No stridor.   Cardiovascular: Normal rate, regular rhythm.  Grossly normal heart sounds.  Good peripheral circulation. Respiratory: Normal respiratory effort.  No retractions. Lungs CTAB. Gastrointestinal: Soft with mild diffuse tenderness to palpation. No distention.  Musculoskeletal: No lower extremity tenderness nor edema.  No joint effusions. Neurologic:  Normal speech and language. No gross focal neurologic deficits are appreciated. Skin:  Skin is warm, dry and intact. No rash noted. Psychiatric: Mood and affect are normal. Speech and behavior are normal.  ____________________________________________   LABS (all labs ordered are listed, but only abnormal results are displayed)  Labs Reviewed  URINALYSIS, COMPLETE (UACMP) WITH MICROSCOPIC - Abnormal; Notable for the following:       Result Value   Color, Urine STRAW (*)    APPearance CLEAR (*)    Glucose, UA >=500 (*)    Hgb urine dipstick SMALL (*)    All other components within normal limits  COMPREHENSIVE METABOLIC PANEL - Abnormal; Notable for the following:    Sodium 122 (*)    Potassium 3.3 (*)    Chloride 92 (*)    CO2 20 (*)    Glucose, Bld 1,001 (*)    Creatinine, Ser 1.26 (*)    Calcium 8.6 (*)    AST 66 (*)    All other components within normal limits  CBC - Abnormal; Notable for the following:    WBC 3.5 (*)    RBC 3.86 (*)    Hemoglobin 12.2 (*)    HCT 36.5 (*)    Platelets 130 (*)    All other components within normal limits  GLUCOSE, CAPILLARY - Abnormal; Notable for the following:    Glucose-Capillary >600 (*)    All other components within normal limits  GLUCOSE, CAPILLARY - Abnormal; Notable for the following:    Glucose-Capillary >600 (*)    All other components within normal limits  GLUCOSE, CAPILLARY - Abnormal; Notable for the following:    Glucose-Capillary >600 (*)    All other components within normal limits  GASTROINTESTINAL PANEL BY PCR, STOOL (REPLACES STOOL CULTURE)  C DIFFICILE QUICK SCREEN W PCR REFLEX  LIPASE, BLOOD  TROPONIN I  CBG  MONITORING, ED   ____________________________________________  EKG  ED ECG REPORT I, Doran Stabler, the attending physician, personally viewed and interpreted this ECG.   Date: 07/02/2016  EKG Time: 1046  Rate: 90  Rhythm: normal sinus rhythm  Axis: Normal  Intervals:none  ST&T Change: No ST segment elevation or depression. T-wave inversion in V2. Possibly secondary to lead placement. Similar EKG in the record from 2011.  ____________________________________________  RADIOLOGY   ____________________________________________   PROCEDURES  Procedure(s) performed:   Procedures  Critical Care performed:   ____________________________________________   INITIAL IMPRESSION / ASSESSMENT AND PLAN / ED COURSE  Pertinent labs & imaging results that were available during my care  of the patient were reviewed by me and considered in my medical decision making (see chart for details).    Clinical Course    ----------------------------------------- 3:53 PM on 07/02/2016 -----------------------------------------  My original plan was to admit the patient on an insulin drip. However, he is refusing admission at this time. We will attempt to bring down his glucose with fluids as well as bolus IV insulin. Stool studies are also pending.  Signed out to Dr. Kerman Passey.  ____________________________________________   FINAL CLINICAL IMPRESSION(S) / ED DIAGNOSES  Diarrhea. Hyperglycemia.    NEW MEDICATIONS STARTED DURING THIS VISIT:  New Prescriptions   No medications on file     Note:  This document was prepared using Dragon voice recognition software and may include unintentional dictation errors.    Orbie Pyo, MD 07/02/16 [phone]

## 2016-07-02 NOTE — Discharge Instructions (Signed)
Please call your primary care doctor tomorrow morning to arrange a follow-up appointment as soon as possible and to discuss her current insulin regimen. Please fill and begin taking your insulin. Please take your loperamide as needed for diarrhea. Return to the emergency department for any generalized weakness/fatigue, vomiting, elevated blood glucose over 500, or any other symptom personally concerning to yourself.

## 2016-07-02 NOTE — ED Provider Notes (Signed)
-----------------------------------------   4:54 PM on 07/02/2016 -----------------------------------------  Patient's blood glucose has decreased to 386. Patient is requesting to leave the emergency department. Patient is eating and drinking in the emergency department without any issue. He states he called his doctor to get a refill of his insulin but he has not heard back yet. He says he has insurance and if I write him for the insulin he will pick it up on his way home. We will discharge the patient with insulin prescriptions as well as a prescription for loperamide. Patient is agreeable to this plan. I discussed very strict glucose monitoring at home as well as insulin adherence. Patient is agreeable.   Harvest Dark, MD 07/02/16 1655

## 2016-08-07 ENCOUNTER — Ambulatory Visit
Admission: RE | Admit: 2016-08-07 | Discharge: 2016-08-07 | Disposition: A | Discharge status: Home / Self Care | Payer: Medicaid Other | Source: Ambulatory Visit | Attending: Gastroenterology | Admitting: Gastroenterology

## 2016-08-07 DIAGNOSIS — R1310 Dysphagia, unspecified: Secondary | ICD-10-CM | POA: Insufficient documentation

## 2016-08-07 DIAGNOSIS — K219 Gastro-esophageal reflux disease without esophagitis: Secondary | ICD-10-CM | POA: Diagnosis not present

## 2016-08-11 ENCOUNTER — Encounter: Payer: Self-pay | Admitting: *Deleted

## 2016-08-12 ENCOUNTER — Emergency Department
Admission: EM | Admit: 2016-08-12 | Discharge: 2016-08-12 | Discharge status: Against Medical Advice | Payer: Medicaid Other | Attending: Emergency Medicine | Admitting: Emergency Medicine

## 2016-08-12 ENCOUNTER — Ambulatory Visit: Payer: Medicaid Other | Admitting: Anesthesiology

## 2016-08-12 ENCOUNTER — Ambulatory Visit
Admission: RE | Admit: 2016-08-12 | Discharge: 2016-08-12 | Disposition: A | Discharge status: Home / Self Care | Payer: Medicaid Other | Source: Ambulatory Visit | Attending: Gastroenterology | Admitting: Gastroenterology

## 2016-08-12 ENCOUNTER — Encounter
Admission: RE | Disposition: A | Discharge status: Home / Self Care | Payer: Self-pay | Source: Ambulatory Visit | Attending: Gastroenterology

## 2016-08-12 ENCOUNTER — Encounter: Payer: Self-pay | Admitting: Intensive Care

## 2016-08-12 DIAGNOSIS — Z794 Long term (current) use of insulin: Secondary | ICD-10-CM | POA: Diagnosis not present

## 2016-08-12 DIAGNOSIS — R739 Hyperglycemia, unspecified: Secondary | ICD-10-CM

## 2016-08-12 DIAGNOSIS — I10 Essential (primary) hypertension: Secondary | ICD-10-CM | POA: Insufficient documentation

## 2016-08-12 DIAGNOSIS — Z539 Procedure and treatment not carried out, unspecified reason: Secondary | ICD-10-CM | POA: Diagnosis present

## 2016-08-12 DIAGNOSIS — E1165 Type 2 diabetes mellitus with hyperglycemia: Secondary | ICD-10-CM | POA: Insufficient documentation

## 2016-08-12 DIAGNOSIS — F17200 Nicotine dependence, unspecified, uncomplicated: Secondary | ICD-10-CM | POA: Diagnosis not present

## 2016-08-12 HISTORY — DX: Unspecified fracture of shaft of right tibia, initial encounter for closed fracture: S82.201A

## 2016-08-12 HISTORY — DX: Gastro-esophageal reflux disease without esophagitis: K21.9

## 2016-08-12 HISTORY — DX: Anemia, unspecified: D64.9

## 2016-08-12 HISTORY — DX: Gastrointestinal hemorrhage, unspecified: K92.2

## 2016-08-12 HISTORY — DX: Alcohol abuse, uncomplicated: F10.10

## 2016-08-12 HISTORY — DX: Unspecified fracture of shaft of right fibula, initial encounter for closed fracture: S82.401A

## 2016-08-12 LAB — GLUCOSE, CAPILLARY: Glucose-Capillary: >600 mg/dL (ref 65–99)

## 2016-08-12 LAB — GLUCOSE, RANDOM: Glucose, Bld: 707 mg/dL (ref 65–99)

## 2016-08-12 SURGERY — EGD (ESOPHAGOGASTRODUODENOSCOPY)
Anesthesia: General

## 2016-08-12 MED ORDER — SODIUM CHLORIDE 0.9 % IV SOLN
INTRAVENOUS | Status: DC
  Administered 2016-08-12: 07:00:00 via INTRAVENOUS

## 2016-08-12 MED ORDER — MIDAZOLAM HCL 2 MG/2ML IJ SOLN
INTRAMUSCULAR | Status: AC
  Filled 2016-08-12: qty 2

## 2016-08-12 MED ORDER — INSULIN ASPART 100 UNIT/ML ~~LOC~~ SOLN
10.0000 [IU] | Freq: Once | SUBCUTANEOUS | Status: AC
  Administered 2016-08-12: 10 [IU] via SUBCUTANEOUS
  Filled 2016-08-12: qty 10

## 2016-08-12 MED ORDER — FENTANYL CITRATE (PF) 100 MCG/2ML IJ SOLN
INTRAMUSCULAR | Status: AC
  Filled 2016-08-12: qty 2

## 2016-08-12 MED ORDER — LIDOCAINE HCL (PF) 2 % IJ SOLN
INTRAMUSCULAR | Status: AC
  Filled 2016-08-12: qty 2

## 2016-08-12 MED ORDER — PROPOFOL 10 MG/ML IV BOLUS
INTRAVENOUS | Status: AC
  Filled 2016-08-12: qty 40

## 2016-08-12 MED ORDER — SODIUM CHLORIDE 0.9 % IV SOLN: INTRAVENOUS | Status: DC

## 2016-08-12 MED ORDER — PROPOFOL 500 MG/50ML IV EMUL
INTRAVENOUS | Status: AC
  Filled 2016-08-12: qty 50

## 2016-08-12 NOTE — ED Provider Notes (Signed)
[facility] Emergency Department Provider Note  ____________________________________________   I have reviewed the triage vital signs and the nursing notes.   HISTORY  Chief Complaint Hyperglycemia    HPI [name] is a 56 y.o. male who presents today with elevated blood sugar. He has no complaints. He feels he is at his baseline. He was supposed to be fasting for endoscopy so he did not take his insulin he states. He also drank some Sprite.He denies any chest pain shortness of breath nausea vomiting headache numbness weakness or any other complaint. He also adamantly states that he does not want to be here and does not want me to do anything for him. He does not want me to check his sugar he does not want me to give him IV fluid he does not want me to check his blood work per his kidney function he does not want me to evaluate him for DKA he does not want any further evaluation. He states he will accept insulin only here and he will go home and continue his insulin regimen at home. He states that the endoscopy made him come in here and he did not want to   Past Medical History:  Diagnosis Date  . Anemia   . BPH without obstruction/lower urinary tract symptoms   . Cellulitis of scrotum   . Cyst of prostate 05/13/2015  . Diabetes ([place])   . Difficulty urinating   . Epididymoorchitis   . ETOH abuse   . Fracture of right tibia and fibula   . GERD (gastroesophageal reflux disease)   . GI bleed   . Hypertension   . Pancreatitis   . Trichomoniasis     Patient Active Problem List   Diagnosis Date Noted  . Cyst of prostate 05/13/2015  . GI bleed 05/12/2015  . Type 2 diabetes mellitus ([place]) 05/12/2015  . GERD (gastroesophageal reflux disease) 05/12/2015  . ETOH abuse 05/12/2015  . BPH (benign prostatic hyperplasia) 05/12/2015    Past Surgical History:  Procedure Laterality Date  . FRACTURE SURGERY     TIBIA AND FIBULA  . FRACTURE SURGERY    .  resection of pancreas    . SCROTAL EXPLORATION      Prior to Admission medications   Medication Sig Start Date End Date Taking? Authorizing Provider  cyclobenzaprine (FLEXERIL) 5 MG tablet Take 5 mg by mouth 3 (three) times daily as needed for muscle spasms.    Historical Provider, MD  gabapentin (NEURONTIN) 100 MG capsule Take 100 mg by mouth 3 (three) times daily.    Historical Provider, MD  hyoscyamine (LEVSIN, ANASPAZ) 0.125 MG tablet Take 0.125 mg by mouth every 4 (four) hours as needed.    Historical Provider, MD  LANTUS SOLOSTAR 100 UNIT/ML Solostar Pen Inject 10 Units into the skin 2 (two) times daily. 07/02/16   Harvest Dark, MD  lipase/protease/amylase (CREON) 12000 units CPEP capsule Take 12,000 Units by mouth.    Historical Provider, MD  lisinopril (PRINIVIL,ZESTRIL) 10 MG tablet Take 10 mg by mouth daily.    Historical Provider, MD  loperamide (IMODIUM A-D) 2 MG tablet Take 1 tablet (2 mg total) by mouth 4 (four) times daily as needed for diarrhea or loose stools. 07/02/16   Harvest Dark, MD  NOVOLOG FLEXPEN 100 UNIT/ML FlexPen Inject 48 Units into the skin at bedtime.  05/16/15   Historical Provider, MD  ondansetron (ZOFRAN) 4 MG tablet Take 1 tablet (4 mg total) by mouth daily as needed  for nausea or vomiting. 04/04/16   Schuyler Amor, MD  Oxycodone HCl 10 MG TABS Take 10 mg by mouth every 8 (eight) hours as needed (for pain.).  02/10/15   Historical Provider, MD  pantoprazole (PROTONIX) 40 MG tablet Take 40 mg by mouth daily.    Historical Provider, MD  pravastatin (PRAVACHOL) 40 MG tablet Take 40 mg by mouth daily.    Historical Provider, MD  pregabalin (LYRICA) 25 MG capsule Take 1 capsule by mouth 3 (three) times daily. 02/21/16   Historical Provider, MD    Allergies Aspirin  Family History  Problem Relation Age of Onset  . Cancer Father     Social History Social History  Substance Use Topics  . Smoking status: Current Every Day Smoker    Packs/day: 0.00  .  Smokeless tobacco: Never Used  . Alcohol use 0.0 oz/week     Comment: weekend had alcohol    Review of Systems Constitutional: No fever/chills Eyes: No visual changes. ENT: No sore throat. No stiff neck no neck pain Cardiovascular: Denies chest pain. Respiratory: Denies shortness of breath. Gastrointestinal:   no vomiting.  No diarrhea.  No constipation. Genitourinary: Negative for dysuria. Musculoskeletal: Negative lower extremity swelling Skin: Negative for rash. Neurological: Negative for severe headaches, focal weakness or numbness. 10-point ROS otherwise negative.  ____________________________________________   PHYSICAL EXAM:  VITAL SIGNS: ED Triage Vitals  Enc Vitals Group     BP      Pulse      Resp      Temp      Temp src      SpO2      Weight      Height      Head Circumference      Peak Flow      Pain Score      Pain Loc      Pain Edu?      Excl. in [place]?     Constitutional: Alert and oriented. Well appearing and in no acute distress. Eyes: Conjunctivae are normal. PERRL. EOMI. Head: Atraumatic. Nose: No congestion/rhinnorhea. Mouth/Throat: Mucous membranes are Somewhat dry.  Oropharynx non-erythematous. Neck: No stridor.   Nontender with no meningismus Cardiovascular: Normal rate, regular rhythm. Grossly normal heart sounds.  Good peripheral circulation. Respiratory: Normal respiratory effort.  No retractions. Lungs CTAB. Abdominal: Soft and nontender. No distention. No guarding no rebound Back:  There is no focal tenderness or step off.  there is no midline tenderness there are no lesions noted. there is no CVA tenderness Musculoskeletal: No lower extremity tenderness, no upper extremity tenderness. No joint effusions, no DVT signs strong distal pulses no edema Neurologic:  Normal speech and language. No gross focal neurologic deficits are appreciated.  Skin:  Skin is warm, dry and intact. No rash noted. Psychiatric: Mood and affect are normal. Speech  and behavior are normal.  ____________________________________________   LABS (all labs ordered are listed, but only abnormal results are displayed)  Labs Reviewed - No data to display ____________________________________________  EKG  I personally interpreted any EKGs ordered by me or triage  ____________________________________________  RADIOLOGY  I reviewed any imaging ordered by me or triage that were performed during my shift and, if possible, patient and/or family made aware of any abnormal findings. ____________________________________________   PROCEDURES  Procedure(s) performed: None  Procedures  Critical Care performed: None  ____________________________________________   INITIAL IMPRESSION / ASSESSMENT AND PLAN / ED COURSE  Pertinent labs & imaging results that were available  during my care of the patient were reviewed by me and considered in my medical decision making (see chart for details).  Patient understands the risk of death and other significant morbidity from refusal of care but he adamantly refuses. He is awake and alert. I feel he understands the ramifications of his decisions. I've explained to him the pathophysiology of hyperglycemia no different ways in which can cause him to get seriously ill and he understands this but he is adamant that I will not give him an IV he will not accept fluid he will not take any other medication aside from a dose of insulin at 10 units and he will go. It would be assaulted this time 3 to force the person to accept care. He understands he can come back and any time I feel the patient has a capacity to understand what I am telling him and it is making a decision with full understanding of the ramifications entailed and implied. Amber, the nurse, was present for this entire conversation. We will give the patient at his request insulin but I have strongly advised him to accept further care from [place] and he refuses.    I have  recommended that this patient have bloodwork and ivf, but he declines at this time. I have discussed the risks and benefits of this examination with him. The patient verbalizes understanding.    ____________________________________________   FINAL CLINICAL IMPRESSION(S) / ED DIAGNOSES  Final diagnoses:  None      This chart was dictated using voice recognition software.  Despite best efforts to proofread,  errors can occur which can change meaning.      Schuyler Amor, MD 08/12/16 [id number]

## 2016-08-12 NOTE — H&P (Signed)
Patient presented for EGD and colonoscopy today. Blood glucose 707, patient ate sandwiches last night.  Unable to do procedures due to these.  Discussed with charge nurse in ER, will send for further management, and rearrange procedures when clinically feasible. Lollie Sails, MD Gastroenterology

## 2016-08-12 NOTE — Discharge Instructions (Signed)
Your sugars dangerously high here. It was read as over 500. We have strongly advised that you accept care from [place] including IV fluid, blood work to ensure that her kidney function is reassuring in that you're not in DKA, and further evaluation. You have refused all of this. This is certainly your choice but does limit our ability to take care of you. It also limits our ability to prevent you from having serious repercussions from elevated glucose which can include renal injury and death. Please return to the emergency room if you change your mind or you feel worse in any way we will be happy to take care of you at this time.

## 2016-08-12 NOTE — Anesthesia Preprocedure Evaluation (Signed)
Anesthesia Evaluation  Patient identified by MRN, date of birth, ID band Patient awake    Reviewed: Allergy & Precautions, NPO status , Patient's Chart, lab work & pertinent test results  Airway Mallampati: II       Dental no notable dental hx.    Pulmonary Current Smoker,    Pulmonary exam normal        Cardiovascular hypertension, Pt. on medications Normal cardiovascular exam     Neuro/Psych negative neurological ROS  negative psych ROS   GI/Hepatic Neg liver ROS, GERD  Medicated,  Endo/Other  diabetes, Well Controlled, Type 2, Oral Hypoglycemic Agents  Renal/GU negative Renal ROS  negative genitourinary   Musculoskeletal negative musculoskeletal ROS (+)   Abdominal Normal abdominal exam  (+)   Peds negative pediatric ROS (+)  Hematology  (+) anemia ,   Anesthesia Other Findings   Reproductive/Obstetrics                             Anesthesia Physical Anesthesia Plan  ASA: III  Anesthesia Plan: General   Post-op Pain Management:    Induction: Intravenous  Airway Management Planned: Nasal Cannula  Additional Equipment:   Intra-op Plan:   Post-operative Plan:   Informed Consent: I have reviewed the patients History and Physical, chart, labs and discussed the procedure including the risks, benefits and alternatives for the proposed anesthesia with the patient or authorized representative who has indicated his/her understanding and acceptance.   Dental advisory given  Plan Discussed with: CRNA and Surgeon  Anesthesia Plan Comments:         Anesthesia Quick Evaluation

## 2016-08-12 NOTE — ED Notes (Signed)
MD at bedside to speak with patient about refusing care. Patient is still refusing line, labs, and fluids for blood sugar reading high on glucometer. Patient reports just wanting insulin and can go home or if we cannot give him insulin he will take at home. Patient was explained the dangers of leaving without care multiple times and still refused treatment infront of this RN and MD Burlene Arnt

## 2016-08-12 NOTE — OR Nursing (Signed)
Blood glucose per lab 707. Dr. Gustavo Lah notified. Patient taken to ED via wheelchair after telephone report to charge nurse from Dr. Gustavo Lah. Procedure cancelled due to elevated blood sugar.

## 2016-08-12 NOTE — ED Triage Notes (Signed)
Patient arrived from Endo by orderly. Orderly reported patient was suppose to have an endoscopy today but consumed foods yesterday and also had too high of a blood sugar for procedure. Pt is A&O x3. Pt is refusing a line, labs, and fluids. Patient wants insulin and to go home. MD made aware

## 2016-08-16 ENCOUNTER — Emergency Department
Admission: EM | Admit: 2016-08-16 | Discharge: 2016-08-17 | Disposition: A | Discharge status: Home / Self Care | Payer: Medicaid Other | Attending: Emergency Medicine | Admitting: Emergency Medicine

## 2016-08-16 ENCOUNTER — Emergency Department: Payer: Medicaid Other

## 2016-08-16 DIAGNOSIS — R739 Hyperglycemia, unspecified: Secondary | ICD-10-CM

## 2016-08-16 DIAGNOSIS — F10920 Alcohol use, unspecified with intoxication, uncomplicated: Secondary | ICD-10-CM

## 2016-08-16 DIAGNOSIS — E1165 Type 2 diabetes mellitus with hyperglycemia: Secondary | ICD-10-CM | POA: Diagnosis present

## 2016-08-16 DIAGNOSIS — Z79899 Other long term (current) drug therapy: Secondary | ICD-10-CM | POA: Diagnosis not present

## 2016-08-16 DIAGNOSIS — N390 Urinary tract infection, site not specified: Secondary | ICD-10-CM | POA: Insufficient documentation

## 2016-08-16 DIAGNOSIS — F17200 Nicotine dependence, unspecified, uncomplicated: Secondary | ICD-10-CM | POA: Insufficient documentation

## 2016-08-16 DIAGNOSIS — F10120 Alcohol abuse with intoxication, uncomplicated: Secondary | ICD-10-CM | POA: Diagnosis not present

## 2016-08-16 DIAGNOSIS — R9082 White matter disease, unspecified: Secondary | ICD-10-CM | POA: Insufficient documentation

## 2016-08-16 DIAGNOSIS — I10 Essential (primary) hypertension: Secondary | ICD-10-CM | POA: Diagnosis not present

## 2016-08-16 LAB — COMPREHENSIVE METABOLIC PANEL
ALK PHOS: 109 U/L (ref 38–126)
ALT: 41 U/L (ref 17–63)
AST: 40 U/L (ref 15–41)
Albumin: 3.4 g/dL — ABNORMAL LOW (ref 3.5–5.0)
Anion gap: 9 (ref 5–15)
BILIRUBIN TOTAL: 0.9 mg/dL (ref 0.3–1.2)
BUN: 21 mg/dL — ABNORMAL HIGH (ref 6–20)
CALCIUM: 8.3 mg/dL — AB (ref 8.9–10.3)
CO2: 20 mmol/L — ABNORMAL LOW (ref 22–32)
CREATININE: 1.08 mg/dL (ref 0.61–1.24)
Chloride: 104 mmol/L (ref 101–111)
Glucose, Bld: 435 mg/dL — ABNORMAL HIGH (ref 65–99)
Potassium: 3.2 mmol/L — ABNORMAL LOW (ref 3.5–5.1)
Sodium: 133 mmol/L — ABNORMAL LOW (ref 135–145)
Total Protein: 6.7 g/dL (ref 6.5–8.1)

## 2016-08-16 LAB — CBC
HCT: 37 % — ABNORMAL LOW (ref 40.0–52.0)
HEMOGLOBIN: 12.4 g/dL — AB (ref 13.0–18.0)
MCH: 31 pg (ref 26.0–34.0)
MCHC: 33.6 g/dL (ref 32.0–36.0)
MCV: 92.2 fL (ref 80.0–100.0)
Platelets: 155 10*3/uL (ref 150–440)
RBC: 4.02 MIL/uL — AB (ref 4.40–5.90)
RDW: 14.2 % (ref 11.5–14.5)
WBC: 6 10*3/uL (ref 3.8–10.6)

## 2016-08-16 LAB — GLUCOSE, CAPILLARY: Glucose-Capillary: 443 mg/dL — ABNORMAL HIGH (ref 65–99)

## 2016-08-16 LAB — LIPASE, BLOOD: LIPASE: 22 U/L (ref 11–51)

## 2016-08-16 LAB — TROPONIN I

## 2016-08-16 MED ORDER — FAMOTIDINE IN NACL 20-0.9 MG/50ML-% IV SOLN
20.0000 mg | Freq: Once | INTRAVENOUS | Status: AC
  Administered 2016-08-16: 20 mg via INTRAVENOUS
  Filled 2016-08-16: qty 50

## 2016-08-16 MED ORDER — SODIUM CHLORIDE 0.9 % IV BOLUS (SEPSIS)
1000.0000 mL | Freq: Once | INTRAVENOUS | Status: AC
  Administered 2016-08-16: 1000 mL via INTRAVENOUS

## 2016-08-16 MED ORDER — ONDANSETRON HCL 4 MG/2ML IJ SOLN
4.0000 mg | Freq: Once | INTRAMUSCULAR | Status: AC
  Administered 2016-08-16: 4 mg via INTRAVENOUS
  Filled 2016-08-16: qty 2

## 2016-08-16 NOTE — ED Notes (Signed)
md at bedside

## 2016-08-16 NOTE — ED Triage Notes (Signed)
Pt from home with hyperglycemia and hypotension per ems. Pt has a delayed verbal response to questions asked. Pt admits to etoh today. Skin dry. fsbs with ems 483. Pt complains of abd pain, diarrhea and increased urination. Pt alert to self, place, situation, but unsure of date.

## 2016-08-17 LAB — URINALYSIS, COMPLETE (UACMP) WITH MICROSCOPIC
Bilirubin Urine: NEGATIVE
Glucose, UA: >=500 mg/dL — AB
Ketones, ur: NEGATIVE mg/dL
Nitrite: NEGATIVE
PROTEIN: NEGATIVE mg/dL
SPECIFIC GRAVITY, URINE: 1.018 (ref 1.005–1.030)
pH: 5 (ref 5.0–8.0)

## 2016-08-17 LAB — URINE DRUG SCREEN, QUALITATIVE (ARMC ONLY)
AMPHETAMINES, UR SCREEN: NOT DETECTED
Barbiturates, Ur Screen: NOT DETECTED
Benzodiazepine, Ur Scrn: NOT DETECTED
COCAINE METABOLITE, UR ~~LOC~~: NOT DETECTED
Cannabinoid 50 Ng, Ur ~~LOC~~: NOT DETECTED
MDMA (ECSTASY) UR SCREEN: NOT DETECTED
METHADONE SCREEN, URINE: NOT DETECTED
Opiate, Ur Screen: NOT DETECTED
Phencyclidine (PCP) Ur S: NOT DETECTED
TRICYCLIC, UR SCREEN: NOT DETECTED

## 2016-08-17 LAB — GLUCOSE, CAPILLARY
GLUCOSE-CAPILLARY: 401 mg/dL — AB (ref 65–99)
Glucose-Capillary: 383 mg/dL — ABNORMAL HIGH (ref 65–99)

## 2016-08-17 LAB — ETHANOL: ALCOHOL ETHYL (B): 231 mg/dL — AB (ref <5)

## 2016-08-17 MED ORDER — SODIUM CHLORIDE 0.9 % IV BOLUS (SEPSIS)
1000.0000 mL | Freq: Once | INTRAVENOUS | Status: AC
  Administered 2016-08-17: 1000 mL via INTRAVENOUS

## 2016-08-17 MED ORDER — CIPROFLOXACIN HCL 500 MG PO TABS
500.0000 mg | ORAL_TABLET | Freq: Once | ORAL | Status: AC
  Administered 2016-08-17: 500 mg via ORAL
  Filled 2016-08-17: qty 1

## 2016-08-17 MED ORDER — INSULIN ASPART 100 UNIT/ML ~~LOC~~ SOLN
10.0000 [IU] | Freq: Once | SUBCUTANEOUS | Status: AC
  Administered 2016-08-17: 10 [IU] via SUBCUTANEOUS
  Filled 2016-08-17: qty 10

## 2016-08-17 MED ORDER — CIPROFLOXACIN HCL 500 MG PO TABS: 500.0000 mg | ORAL_TABLET | Freq: Two times a day (BID) | ORAL | 0 refills | Status: DC

## 2016-08-17 NOTE — ED Notes (Signed)
Upon making hourly rounds, this RN discovers the pt has soiled himself with urine and feces; pt is assisted in cleaning himself; provided with new hospital gown, new briefs, and complete linen change. Pt is assisted back to bed and IV fluids are restarted.

## 2016-08-17 NOTE — ED Provider Notes (Signed)
[facility] Emergency Department Provider Note   ____________________________________________   First MD Initiated Contact with Patient 08/16/16 2311     (approximate)  I have reviewed the triage vital signs and the nursing notes.   HISTORY  Chief Complaint Hyperglycemia and Hypotension    HPI [name] is a 56 y.o. male brought to the ED from home via EMS with a chief complaint of hyperglycemia, abdominal pain, diarrhea and increased urination. Patient is an insulin-dependent diabetic who was sent to the ED 2 days ago for hyperglycemia. Patient refused workup at that time.Complains of epigastric pain, increased thirst and urination, diarrhea times one day. Admits to EtOH tonight. Denies recent fever, chills, chest pain, shortness of breath, nausea, vomiting. Denies recent travel trauma. Nothing makes his symptoms better or worse.   Past Medical History:  Diagnosis Date  . Anemia   . BPH without obstruction/lower urinary tract symptoms   . Cellulitis of scrotum   . Cyst of prostate 05/13/2015  . Diabetes ([place])   . Difficulty urinating   . Epididymoorchitis   . ETOH abuse   . Fracture of right tibia and fibula   . GERD (gastroesophageal reflux disease)   . GI bleed   . Hypertension   . Pancreatitis   . Trichomoniasis     Patient Active Problem List   Diagnosis Date Noted  . Cyst of prostate 05/13/2015  . GI bleed 05/12/2015  . Type 2 diabetes mellitus ([place]) 05/12/2015  . GERD (gastroesophageal reflux disease) 05/12/2015  . ETOH abuse 05/12/2015  . BPH (benign prostatic hyperplasia) 05/12/2015    Past Surgical History:  Procedure Laterality Date  . FRACTURE SURGERY     TIBIA AND FIBULA  . FRACTURE SURGERY    . resection of pancreas    . SCROTAL EXPLORATION      Prior to Admission medications   Medication Sig Start Date End Date Taking? Authorizing Provider  cyclobenzaprine (FLEXERIL) 5 MG tablet Take 5 mg by mouth 3 (three)  times daily as needed for muscle spasms.   Yes Historical Provider, MD  gabapentin (NEURONTIN) 100 MG capsule Take 100 mg by mouth 3 (three) times daily.   Yes Historical Provider, MD  GAVILYTE-N WITH FLAVOR PACK 420 g solution Take 1 Dose by mouth as directed. 08/06/16  Yes Historical Provider, MD  hyoscyamine (LEVSIN, ANASPAZ) 0.125 MG tablet Take 0.125 mg by mouth every 4 (four) hours as needed.   Yes Historical Provider, MD  LANTUS SOLOSTAR 100 UNIT/ML Solostar Pen Inject 10 Units into the skin 2 (two) times daily. 07/02/16  Yes Harvest Dark, MD  lipase/protease/amylase (CREON) 12000 units CPEP capsule Take 12,000 Units by mouth.   Yes Historical Provider, MD  lisinopril (PRINIVIL,ZESTRIL) 10 MG tablet Take 10 mg by mouth daily.   Yes Historical Provider, MD  loperamide (IMODIUM A-D) 2 MG tablet Take 1 tablet (2 mg total) by mouth 4 (four) times daily as needed for diarrhea or loose stools. 07/02/16  Yes Harvest Dark, MD  NOVOLOG FLEXPEN 100 UNIT/ML FlexPen Inject 48 Units into the skin at bedtime.  05/16/15  Yes Historical Provider, MD  Oxycodone HCl 10 MG TABS Take 10 mg by mouth every 8 (eight) hours as needed (for pain.).  02/10/15  Yes Historical Provider, MD  pantoprazole (PROTONIX) 40 MG tablet Take 40 mg by mouth daily.   Yes Historical Provider, MD  pravastatin (PRAVACHOL) 40 MG tablet Take 40 mg by mouth daily.   Yes Historical Provider, MD  pregabalin (LYRICA) 25 MG capsule Take 1 capsule by mouth 3 (three) times daily. 02/21/16  Yes Historical Provider, MD    Allergies Aspirin  Family History  Problem Relation Age of Onset  . Cancer Father     Social History Social History  Substance Use Topics  . Smoking status: Current Every Day Smoker    Packs/day: 0.00  . Smokeless tobacco: Never Used  . Alcohol use 0.0 oz/week     Comment: weekend had alcohol    Review of Systems  Constitutional: No fever/chills. Eyes: No visual changes. ENT: No sore  throat. Cardiovascular: Denies chest pain. Respiratory: Denies shortness of breath. Gastrointestinal: Positive for abdominal pain.  No nausea, no vomiting.  Positive for diarrhea.  No constipation. Genitourinary: Positive for frequency. Negative for dysuria. Musculoskeletal: Negative for back pain. Skin: Negative for rash. Neurological: Negative for headaches, focal weakness or numbness.  10-point ROS otherwise negative.  ____________________________________________   PHYSICAL EXAM:  VITAL SIGNS: ED Triage Vitals  Enc Vitals Group     BP 08/16/16 2300 98/74     Pulse Rate 08/16/16 2300 71     Resp --      Temp --      Temp src --      SpO2 08/16/16 2300 100 %     Weight 08/16/16 2301 130 lb (59 kg)     Height 08/16/16 2301 5\' 8"  (1.727 m)     Head Circumference --      Peak Flow --      Pain Score 08/16/16 2301 8     Pain Loc --      Pain Edu? --      Excl. in [place]? --     Constitutional: Drowsy. Thin appearing and in no acute distress. Eyes: Conjunctivae are normal. PERRL. EOMI. Head: Atraumatic. Nose: No congestion/rhinnorhea. Mouth/Throat: Mucous membranes are moist.  Oropharynx non-erythematous. Neck: No stridor.   Cardiovascular: Normal rate, regular rhythm. Grossly normal heart sounds.  Good peripheral circulation. Respiratory: Normal respiratory effort.  No retractions. Lungs CTAB. Gastrointestinal: Soft and mildly tender to palpation epigastrium without rebound or guarding. No distention. No abdominal bruits. No CVA tenderness. Musculoskeletal: No lower extremity tenderness nor edema.  No joint effusions. Neurologic:  Drowsy. Appears intoxicated. Delayed speech and language. No gross focal neurologic deficits are appreciated. MAEx4. Skin:  Skin is warm, dry and intact. No rash noted. Psychiatric: Mood and affect are normal. Speech and behavior are normal.  ____________________________________________   LABS (all labs ordered are listed, but only abnormal  results are displayed)  Labs Reviewed  GLUCOSE, CAPILLARY - Abnormal; Notable for the following:       Result Value   Glucose-Capillary 443 (*)    All other components within normal limits  CBC - Abnormal; Notable for the following:    RBC 4.02 (*)    Hemoglobin 12.4 (*)    HCT 37.0 (*)    All other components within normal limits  URINALYSIS, COMPLETE (UACMP) WITH MICROSCOPIC - Abnormal; Notable for the following:    Color, Urine YELLOW (*)    APPearance CLOUDY (*)    Glucose, UA >=500 (*)    Hgb urine dipstick SMALL (*)    Leukocytes, UA LARGE (*)    Bacteria, UA RARE (*)    Squamous Epithelial / LPF 0-5 (*)    All other components within normal limits  COMPREHENSIVE METABOLIC PANEL - Abnormal; Notable for the following:    Sodium 133 (*)    Potassium 3.2 (*)  CO2 20 (*)    Glucose, Bld 435 (*)    BUN 21 (*)    Calcium 8.3 (*)    Albumin 3.4 (*)    All other components within normal limits  BLOOD GAS, VENOUS - Abnormal; Notable for the following:    pCO2, Ven 39 (*)    Bicarbonate 18.3 (*)    Acid-base deficit 7.9 (*)    All other components within normal limits  ETHANOL - Abnormal; Notable for the following:    Alcohol, Ethyl (B) 231 (*)    All other components within normal limits  GLUCOSE, CAPILLARY - Abnormal; Notable for the following:    Glucose-Capillary 401 (*)    All other components within normal limits  GLUCOSE, CAPILLARY - Abnormal; Notable for the following:    Glucose-Capillary 383 (*)    All other components within normal limits  TROPONIN I  LIPASE, BLOOD  URINE DRUG SCREEN, QUALITATIVE ([facility] ONLY)  CBG MONITORING, ED   ____________________________________________  EKG  ED ECG REPORT I, Seymour Pavlak J, the attending physician, personally viewed and interpreted this ECG.   Date: 08/17/2016  EKG Time: 2309  Rate: 70  Rhythm: normal EKG, normal sinus rhythm  Axis: Normal  Intervals:right bundle branch block  ST&T Change:  Nonspecific  ____________________________________________  RADIOLOGY  CT head interpreted per Dr. Randel Pigg: No acute intracranial abnormality. 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 Mild superficial  atrophy.   Portable chest x-ray interpreted per Dr. Randel Pigg: No active disease. ____________________________________________   PROCEDURES  Procedure(s) performed: None  Procedures  Critical Care performed: No  ____________________________________________   INITIAL IMPRESSION / ASSESSMENT AND PLAN / ED COURSE  Pertinent labs & imaging results that were available during my care of the patient were reviewed by me and considered in my medical decision making (see chart for details).  56 year old male with a history of insulin-dependent diabetes who presents with epigastric discomfort, hyperglycemia and drowsiness. Will obtain screening lab work, CT head, initiate IV fluid resuscitation and reassess. Chart reviewed noted patient had normal caliber aorta on CT scan 04/04/2016.  Clinical Course as of Aug 17 430  Sun Aug 17, 2016  0043 Patient is sleeping in no acute distress. Blood pressure improved with IV fluids. Will recheck blood sugar after second liter IV fluids. Laboratory results remarkable for elevated EtOH, glucose 435. No anion gap.  [JS]  [id number] Patient awake, alert, hungry. Fluids are almost completed; will recheck blood sugar.  [JS]  0350 Repeat BS 401. UTI may be contributing to hyperglycemia. Will administer subcutaneous insulin. Will let patient eat given he is very hungry and requesting food multiple times.  [JS]  0430 Blood sugar 383 after [place] sandwich male. Patient has pulled off his IV, put on his jacket and is eagerly awaiting discharge. Ambulating with steady gait. Will discharge home with prescription for Cipro for UTI and encourage patient to follow-up with his PCP early next week. Strict return precautions given. Patient  verbalizes understanding and agrees with plan of care.  [JS]    Clinical Course User Index [JS] Paulette Blanch, MD     ____________________________________________   FINAL CLINICAL IMPRESSION(S) / ED DIAGNOSES  Final diagnoses:  Hyperglycemia  Alcoholic intoxication without complication ([place])  Lower urinary tract infectious disease      NEW MEDICATIONS STARTED DURING THIS VISIT:  New Prescriptions   No medications on file     Note:  This document was prepared using Dragon voice recognition software and may  include unintentional dictation errors.    Paulette Blanch, MD 08/17/16 [phone]

## 2016-08-17 NOTE — Discharge Instructions (Signed)
1. Take your insulin daily as directed by your doctor. 2. Take antibiotic as prescribed (Cipro 500 mg twice daily 7 days). 3. Return to the ER for worsening symptoms, persistent vomiting, difficulty breathing or other concerns.

## 2016-08-17 NOTE — ED Notes (Signed)
MD notified of pt's CBG of 401; MD ordered 10 units of sub-q insulin; pt asked to have something to eat; MD asked about what to give the pt, MD okay with giving pt a sandwich tray

## 2016-08-19 LAB — BLOOD GAS, VENOUS
Acid-base deficit: 7.9 mmol/L — ABNORMAL HIGH (ref 0.0–2.0)
BICARBONATE: 18.3 mmol/L — AB (ref 20.0–28.0)
FIO2: 0.21
Patient temperature: 37
pCO2, Ven: 39 mmHg — ABNORMAL LOW (ref 44.0–60.0)
pH, Ven: 7.28 (ref 7.250–7.430)

## 2016-11-13 ENCOUNTER — Other Ambulatory Visit: Payer: Self-pay

## 2016-11-30 ENCOUNTER — Inpatient Hospital Stay
Admission: EM | Admit: 2016-11-30 | Discharge: 2016-12-04 | DRG: 920 | Disposition: A | Discharge status: Home / Self Care | Payer: Medicaid Other | Attending: Specialist | Admitting: Specialist

## 2016-11-30 ENCOUNTER — Encounter: Payer: Self-pay | Admitting: Emergency Medicine

## 2016-11-30 ENCOUNTER — Emergency Department: Payer: Medicaid Other

## 2016-11-30 DIAGNOSIS — E871 Hypo-osmolality and hyponatremia: Secondary | ICD-10-CM

## 2016-11-30 DIAGNOSIS — C187 Malignant neoplasm of sigmoid colon: Secondary | ICD-10-CM | POA: Diagnosis present

## 2016-11-30 DIAGNOSIS — E119 Type 2 diabetes mellitus without complications: Secondary | ICD-10-CM

## 2016-11-30 DIAGNOSIS — I10 Essential (primary) hypertension: Secondary | ICD-10-CM | POA: Diagnosis present

## 2016-11-30 DIAGNOSIS — N400 Enlarged prostate without lower urinary tract symptoms: Secondary | ICD-10-CM | POA: Diagnosis present

## 2016-11-30 DIAGNOSIS — F10920 Alcohol use, unspecified with intoxication, uncomplicated: Secondary | ICD-10-CM

## 2016-11-30 DIAGNOSIS — Y838 Other surgical procedures as the cause of abnormal reaction of the patient, or of later complication, without mention of misadventure at the time of the procedure: Secondary | ICD-10-CM | POA: Diagnosis present

## 2016-11-30 DIAGNOSIS — K635 Polyp of colon: Secondary | ICD-10-CM

## 2016-11-30 DIAGNOSIS — F10120 Alcohol abuse with intoxication, uncomplicated: Secondary | ICD-10-CM | POA: Diagnosis present

## 2016-11-30 DIAGNOSIS — D62 Acute posthemorrhagic anemia: Secondary | ICD-10-CM

## 2016-11-30 DIAGNOSIS — D490 Neoplasm of unspecified behavior of digestive system: Secondary | ICD-10-CM

## 2016-11-30 DIAGNOSIS — K861 Other chronic pancreatitis: Secondary | ICD-10-CM | POA: Diagnosis present

## 2016-11-30 DIAGNOSIS — R739 Hyperglycemia, unspecified: Secondary | ICD-10-CM

## 2016-11-30 DIAGNOSIS — R579 Shock, unspecified: Secondary | ICD-10-CM | POA: Diagnosis present

## 2016-11-30 DIAGNOSIS — R680 Hypothermia, not associated with low environmental temperature: Secondary | ICD-10-CM | POA: Diagnosis present

## 2016-11-30 DIAGNOSIS — K621 Rectal polyp: Secondary | ICD-10-CM

## 2016-11-30 DIAGNOSIS — I959 Hypotension, unspecified: Secondary | ICD-10-CM

## 2016-11-30 DIAGNOSIS — D122 Benign neoplasm of ascending colon: Secondary | ICD-10-CM

## 2016-11-30 DIAGNOSIS — D123 Benign neoplasm of transverse colon: Secondary | ICD-10-CM | POA: Diagnosis present

## 2016-11-30 DIAGNOSIS — Z794 Long term (current) use of insulin: Secondary | ICD-10-CM

## 2016-11-30 DIAGNOSIS — K219 Gastro-esophageal reflux disease without esophagitis: Secondary | ICD-10-CM | POA: Diagnosis present

## 2016-11-30 DIAGNOSIS — E785 Hyperlipidemia, unspecified: Secondary | ICD-10-CM | POA: Diagnosis present

## 2016-11-30 DIAGNOSIS — E1142 Type 2 diabetes mellitus with diabetic polyneuropathy: Secondary | ICD-10-CM | POA: Diagnosis present

## 2016-11-30 DIAGNOSIS — F17210 Nicotine dependence, cigarettes, uncomplicated: Secondary | ICD-10-CM | POA: Diagnosis present

## 2016-11-30 DIAGNOSIS — F1010 Alcohol abuse, uncomplicated: Secondary | ICD-10-CM | POA: Diagnosis present

## 2016-11-30 DIAGNOSIS — E876 Hypokalemia: Secondary | ICD-10-CM | POA: Diagnosis present

## 2016-11-30 DIAGNOSIS — E1165 Type 2 diabetes mellitus with hyperglycemia: Secondary | ICD-10-CM | POA: Diagnosis present

## 2016-11-30 DIAGNOSIS — T8119XA Other postprocedural shock, initial encounter: Principal | ICD-10-CM | POA: Diagnosis present

## 2016-11-30 DIAGNOSIS — I951 Orthostatic hypotension: Secondary | ICD-10-CM | POA: Diagnosis present

## 2016-11-30 DIAGNOSIS — K529 Noninfective gastroenteritis and colitis, unspecified: Secondary | ICD-10-CM | POA: Diagnosis present

## 2016-11-30 DIAGNOSIS — K3189 Other diseases of stomach and duodenum: Secondary | ICD-10-CM | POA: Diagnosis present

## 2016-11-30 DIAGNOSIS — Z79899 Other long term (current) drug therapy: Secondary | ICD-10-CM

## 2016-11-30 DIAGNOSIS — R55 Syncope and collapse: Secondary | ICD-10-CM

## 2016-11-30 LAB — TROPONIN I: Troponin I: <0.03 ng/mL (ref <0.03)

## 2016-11-30 LAB — COMPREHENSIVE METABOLIC PANEL
ALT: 41 U/L (ref 17–63)
ANION GAP: 10 (ref 5–15)
AST: 74 U/L — ABNORMAL HIGH (ref 15–41)
Albumin: 2.7 g/dL — ABNORMAL LOW (ref 3.5–5.0)
Alkaline Phosphatase: 165 U/L — ABNORMAL HIGH (ref 38–126)
BUN: 16 mg/dL (ref 6–20)
CHLORIDE: 100 mmol/L — AB (ref 101–111)
CO2: 20 mmol/L — AB (ref 22–32)
Calcium: 8.1 mg/dL — ABNORMAL LOW (ref 8.9–10.3)
Creatinine, Ser: 0.99 mg/dL (ref 0.61–1.24)
Glucose, Bld: 422 mg/dL — ABNORMAL HIGH (ref 65–99)
POTASSIUM: 3.5 mmol/L (ref 3.5–5.1)
SODIUM: 130 mmol/L — AB (ref 135–145)
Total Bilirubin: 0.7 mg/dL (ref 0.3–1.2)
Total Protein: 5.6 g/dL — ABNORMAL LOW (ref 6.5–8.1)

## 2016-11-30 LAB — CBC
HEMATOCRIT: 32 % — AB (ref 40.0–52.0)
HEMOGLOBIN: 11 g/dL — AB (ref 13.0–18.0)
MCH: 33.7 pg (ref 26.0–34.0)
MCHC: 34.5 g/dL (ref 32.0–36.0)
MCV: 97.8 fL (ref 80.0–100.0)
PLATELETS: 183 10*3/uL (ref 150–440)
RBC: 3.27 MIL/uL — AB (ref 4.40–5.90)
RDW: 14.7 % — ABNORMAL HIGH (ref 11.5–14.5)
WBC: 6.8 10*3/uL (ref 3.8–10.6)

## 2016-11-30 LAB — URINALYSIS, COMPLETE (UACMP) WITH MICROSCOPIC
BACTERIA UA: NONE SEEN
BILIRUBIN URINE: NEGATIVE
Glucose, UA: >=500 mg/dL — AB
Hgb urine dipstick: NEGATIVE
KETONES UR: NEGATIVE mg/dL
Nitrite: NEGATIVE
PH: 5 (ref 5.0–8.0)
PROTEIN: NEGATIVE mg/dL
Specific Gravity, Urine: 1.021 (ref 1.005–1.030)

## 2016-11-30 LAB — URINE DRUG SCREEN, QUALITATIVE (ARMC ONLY)
AMPHETAMINES, UR SCREEN: NOT DETECTED
Barbiturates, Ur Screen: NOT DETECTED
Benzodiazepine, Ur Scrn: NOT DETECTED
COCAINE METABOLITE, UR ~~LOC~~: NOT DETECTED
Cannabinoid 50 Ng, Ur ~~LOC~~: NOT DETECTED
MDMA (ECSTASY) UR SCREEN: NOT DETECTED
METHADONE SCREEN, URINE: NOT DETECTED
OPIATE, UR SCREEN: NOT DETECTED
Phencyclidine (PCP) Ur S: NOT DETECTED
TRICYCLIC, UR SCREEN: NOT DETECTED

## 2016-11-30 LAB — ETHANOL: Alcohol, Ethyl (B): 191 mg/dL — ABNORMAL HIGH (ref <5)

## 2016-11-30 LAB — GLUCOSE, CAPILLARY: GLUCOSE-CAPILLARY: 419 mg/dL — AB (ref 65–99)

## 2016-11-30 LAB — LIPASE, BLOOD: LIPASE: 15 U/L (ref 11–51)

## 2016-11-30 MED ORDER — PIPERACILLIN-TAZOBACTAM 3.375 G IVPB 30 MIN
3.3750 g | Freq: Once | INTRAVENOUS | Status: AC
  Administered 2016-11-30: 3.375 g via INTRAVENOUS
  Filled 2016-11-30: qty 50

## 2016-11-30 MED ORDER — SODIUM CHLORIDE 0.9 % IV BOLUS (SEPSIS)
1000.0000 mL | Freq: Once | INTRAVENOUS | Status: AC
  Administered 2016-11-30: 1000 mL via INTRAVENOUS

## 2016-11-30 MED ORDER — FENTANYL CITRATE (PF) 100 MCG/2ML IJ SOLN
50.0000 ug | Freq: Once | INTRAMUSCULAR | Status: AC
  Administered 2016-11-30: 50 ug via INTRAVENOUS
  Filled 2016-11-30: qty 2

## 2016-11-30 MED ORDER — IOPAMIDOL (ISOVUE-300) INJECTION 61%
15.0000 mL | INTRAVENOUS | Status: AC
  Administered 2016-11-30 (×2): 15 mL via ORAL

## 2016-11-30 MED ORDER — IOPAMIDOL (ISOVUE-300) INJECTION 61%
75.0000 mL | Freq: Once | INTRAVENOUS | Status: AC | PRN
  Administered 2016-11-30: 75 mL via INTRAVENOUS

## 2016-11-30 MED ORDER — ONDANSETRON HCL 4 MG/2ML IJ SOLN
4.0000 mg | Freq: Once | INTRAMUSCULAR | Status: AC
  Administered 2016-11-30: 4 mg via INTRAVENOUS
  Filled 2016-11-30: qty 2

## 2016-11-30 MED ORDER — INSULIN ASPART 100 UNIT/ML ~~LOC~~ SOLN
10.0000 [IU] | Freq: Once | SUBCUTANEOUS | Status: AC
  Administered 2016-11-30: 10 [IU] via INTRAVENOUS
  Filled 2016-11-30: qty 10

## 2016-11-30 NOTE — ED Provider Notes (Signed)
[facility] Emergency Department Provider Note  ____________________________________________  Time seen: Approximately 8:39 PM  I have reviewed the triage vital signs and the nursing notes.   HISTORY  Chief Complaint Dizziness; Weakness; and Diarrhea  History limited due to patient being a poor historian.  HPI [name] is a 56 y.o. male with a history of pancreatitis, alcohol abuse, anemia, diabetes, presenting with abdominal pain, nausea without vomiting, diarrhea, and recurrent syncope. The patient and his wife report that for more than one year the patient has been having chronic daily watery nonbloody diarrhea. Over the past couple of days, he reports the frequency has increased to more than 10 episodes daily.This is associated with diffuse abdominal pain but the patient is unable to characterize. He has nausea without vomiting. He has subjectively felt febrile but has not taken his temperature. No dysuria. Positive greater than 20 pound weight loss. Over the same period of time, the patient has had recurrent syncope and is having episodes of fainting several times per week. The patient states he has seen his primary care physician but is unable to give me information about what has been evaluated; he states he has appointment with a gastrointestinal physician next month. The patient denies any camping, or international travel, or known sick contacts.   Past Medical History:  Diagnosis Date  . Anemia   . BPH without obstruction/lower urinary tract symptoms   . Cellulitis of scrotum   . Cyst of prostate 05/13/2015  . Diabetes ([place])   . Difficulty urinating   . Epididymoorchitis   . ETOH abuse   . Fracture of right tibia and fibula   . GERD (gastroesophageal reflux disease)   . GI bleed   . Pancreatitis   . Trichomoniasis     Patient Active Problem List   Diagnosis Date Noted  . Pancreatitis 04/17/2016  . Noncompliance 07/05/2015  . Tobacco  dependence 07/05/2015  . Fracture of right tibia and fibula 06/05/2015  . Type 2 diabetes mellitus with hyperglycemia ([place]) 06/05/2015  . Prostatic cyst 05/13/2015  . GI bleed 05/12/2015  . Type 2 diabetes mellitus ([place]) 05/12/2015  . GERD (gastroesophageal reflux disease) 05/12/2015  . ETOH abuse 05/12/2015  . BPH (benign prostatic hyperplasia) 05/12/2015    Past Surgical History:  Procedure Laterality Date  . FRACTURE SURGERY     TIBIA AND FIBULA  . FRACTURE SURGERY    . resection of pancreas    . SCROTAL EXPLORATION      Current Outpatient Rx  . Order #: [id number] Class: Print  . Order #: [id number] Class: Historical Med  . Order #: [id number] Class: Historical Med  . Order #: [id number] Class: Historical Med  . Order #: [id number] Class: Historical Med  . Order #: [id number] Class: Historical Med  . Order #: [id number] Class: Historical Med  . Order #: [id number] Class: Historical Med  . Order #: [id number] Class: Historical Med  . Order #: [id number] Class: Print  . Order #: [id number] Class: Historical Med  . Order #: [id number] Class: Historical Med  . Order #: [id number] Class: Print  . Order #: [id number] Class: Historical Med  . Order #: [id number] Class: Historical Med  . Order #: [id number] Class: Historical Med  . Order #: [id number] Class: Historical Med  . Order #: [id number] Class: Historical Med  . Order #: [id number] Class: Historical Med  . Order #: [id number] Class: Historical Med    Allergies Aspirin  Family History  Problem Relation Age of Onset  . Cancer Father     Social History Social History  Substance Use Topics  .  Smoking status: Current Every Day Smoker    Packs/day: 0.00  . Smokeless tobacco: Never Used  . Alcohol use 0.0 oz/week     Comment: occ    Review of Systems Constitutional: No fever/chills.Positive recurrent syncope. Positive generalized malaise. Eyes: No visual changes. ENT: No sore throat. No congestion or rhinorrhea. Cardiovascular: Denies chest  pain. Denies palpitations. Respiratory: Denies shortness of breath.  No cough. Gastrointestinal: Positive diffuse abdominal pain.  Positive nausea, no vomiting.  Positive nonbloody diarrhea.  No constipation. Genitourinary: Negative for dysuria. Musculoskeletal: Negative for back pain. Skin: Negative for rash. Neurological: Negative for headaches. No focal numbness, tingling or weakness. No difficulty walking due to imbalance    ____________________________________________   PHYSICAL EXAM:  VITAL SIGNS: ED Triage Vitals  Enc Vitals Group     BP 11/30/16 2008 (!) 78/48     Pulse Rate 11/30/16 2008 87     Resp 11/30/16 2008 18     Temp 11/30/16 2008 98.8 F (37.1 C)     Temp Source 11/30/16 2008 Oral     SpO2 11/30/16 2008 98 %     Weight 11/30/16 2009 110 lb (49.9 kg)     Height 11/30/16 2009 5\' 8"  (1.727 m)     Head Circumference --      Peak Flow --      Pain Score 11/30/16 2008 10     Pain Loc --      Pain Edu? --      Excl. in [place]? --     Constitutional: Alert and oriented. Chronically ill appearing and in no acute distress. Answers questions appropriately. Eyes: Conjunctivae injected on the right.  EOMI. No scleral icterus. Head: Atraumatic. Nose: No congestion/rhinnorhea. Mouth/Throat: Mucous membranes are dry.  Neck: No stridor.  Supple. No JVD. No meningismus.  Cardiovascular: Normal rate, regular rhythm. No murmurs, rubs or gallops.  Respiratory: Normal respiratory effort.  No accessory muscle use or retractions. Lungs CTAB.  No wheezes, rales or ronchi. Gastrointestinal: Soft, and nondistended.  Diffusely tender to palpation without focality. Negative Murphy sign. No guarding or rebound.  No peritoneal signs. Musculoskeletal: No LE edema. No ttp in the calves or palpable cords.  Negative Homan's sign. Neurologic:  A&Ox3.  Speech is clear.  Face and smile are symmetric.  EOMI.  Moves all extremities well. Skin:  Skin is warm, dry and intact. No rash  noted. Psychiatric: Mood and affect are normal. Speech and behavior are normal.  Normal judgement  ____________________________________________   LABS (all labs ordered are listed, but only abnormal results are displayed)  Labs Reviewed  COMPREHENSIVE METABOLIC PANEL - Abnormal; Notable for the following:       Result Value   Sodium 130 (*)    Chloride 100 (*)    CO2 20 (*)    Glucose, Bld 422 (*)    Calcium 8.1 (*)    Total Protein 5.6 (*)    Albumin 2.7 (*)    AST 74 (*)    Alkaline Phosphatase 165 (*)    All other components within normal limits  CBC - Abnormal; Notable for the following:    RBC 3.27 (*)    Hemoglobin 11.0 (*)    HCT 32.0 (*)    RDW 14.7 (*)    All other components within normal limits  URINALYSIS, COMPLETE (UACMP) WITH MICROSCOPIC - Abnormal; Notable for the following:    Color, Urine YELLOW (*)    APPearance CLEAR (*)    Glucose, UA >=500 (*)  Leukocytes, UA SMALL (*)    Squamous Epithelial / LPF 0-5 (*)    All other components within normal limits  GLUCOSE, CAPILLARY - Abnormal; Notable for the following:    Glucose-Capillary 419 (*)    All other components within normal limits  ETHANOL - Abnormal; Notable for the following:    Alcohol, Ethyl (B) 191 (*)    All other components within normal limits  GASTROINTESTINAL PANEL BY PCR, STOOL (REPLACES STOOL CULTURE)  C DIFFICILE QUICK SCREEN W PCR REFLEX  LIPASE, BLOOD  TROPONIN I  URINE DRUG SCREEN, QUALITATIVE ([facility] ONLY)   ____________________________________________  EKG  ED ECG REPORT I, Eula Listen, the attending physician, personally viewed and interpreted this ECG.   Date: 11/30/2016  EKG Time: 2019  Rate: 78  Rhythm: normal sinus rhythm  Axis: normal  Intervals:none  ST&T Change: No STEMI  ____________________________________________  RADIOLOGY  Dg Chest 2 View  Result Date: 11/30/2016 CLINICAL DATA:  Weakness EXAM: CHEST  2 VIEW COMPARISON:  08/16/2016  FINDINGS: Cardiac shadow is within normal limits. The lungs are well aerated bilaterally. Old rib fractures are seen bilaterally with healing. No acute abnormality is noted. IMPRESSION: No acute abnormality noted. Electronically Signed   By: Inez Catalina M.D.   On: 11/30/2016 23:03   Ct Head Wo Contrast  Result Date: 11/30/2016 CLINICAL DATA:  Recurrent syncope, hypotension and weakness for several days. Nausea and emesis today. EXAM: CT HEAD WITHOUT CONTRAST TECHNIQUE: Contiguous axial images were obtained from the base of the skull through the vertex without intravenous contrast. COMPARISON:  08/16/2016 head CT FINDINGS: Brain: Chronic stable small vessel ischemic changes of periventricular white matter. No large vascular territory infarction. Idiopathic basal ganglial calcifications are noted bilaterally left greater than right. No acute intracranial hemorrhage, midline shift or edema. No extra-axial fluid collections. No hydrocephalus. Basal cisterns and fourth ventricle are midline. Vascular: No hyperdense vessels or unexpected calcifications. Skull: No primary bone lesion.  No skull fracture. Sinuses/Orbits: Intact orbits and globes. No acute abnormality of the visualized paranasal sinuses and mastoids. Other: None IMPRESSION: Chronic stable small vessel ischemic disease of periventricular white matter. No acute intracranial abnormality. Electronically Signed   By: Ashley Royalty M.D.   On: 11/30/2016 22:51   Ct Abdomen Pelvis W Contrast  Result Date: 11/30/2016 CLINICAL DATA:  Chronic diarrhea and bloody stools EXAM: CT ABDOMEN AND PELVIS WITH CONTRAST TECHNIQUE: Multidetector CT imaging of the abdomen and pelvis was performed using the standard protocol following bolus administration of intravenous contrast. CONTRAST:  64mL ISOVUE-300 IOPAMIDOL (ISOVUE-300) INJECTION 61% COMPARISON:  04/04/2016 FINDINGS: Lower chest: Lung bases are free of acute infiltrate or sizable effusion. Hepatobiliary:  Hyperenhancing lesion is again seen in the posterior aspect of the right lobe consistent with hemangioma. This is stable from the prior exam. No gallstones, gallbladder wall thickening, or biliary dilatation. Pancreas: Diffuse pancreatic calcifications are noted consistent with chronic pancreatitis. No acute inflammatory changes are seen. Spleen: Normal in size without focal abnormality. Adrenals/Urinary Tract: The adrenal glands are within normal limits. Kidneys demonstrate scattered hypodensities likely related to cysts. No renal calculi or obstructive changes are seen. The bladder is well distended. Stomach/Bowel: Stomach is within normal limits. Appendix appears normal. No evidence of bowel wall thickening, distention, or inflammatory changes. Postsurgical changes are noted in the small bowel in the left mid abdomen. Vascular/Lymphatic: Aortic atherosclerosis. Few small gastrohepatic ligament lymph nodes are noted. The largest of these measures 11 mm in short axis best seen on image number 22 of  series 2. This is slightly larger than that seen on the prior exam. Reproductive: Prostate again demonstrates a 2.1 cm cyst similar to that seen on the prior exam. Other: No abdominal wall hernia or abnormality. No abdominopelvic ascites. Musculoskeletal: Healed left rib fracture is noted. Degenerative changes of the lumbar spine are noted. IMPRESSION: Stable chronic changes as described above. Small gastrohepatic ligament lymph nodes slightly more prominent than that seen on the prior exam likely of a reactive nature. Electronically Signed   By: Inez Catalina M.D.   On: 11/30/2016 23:00    ____________________________________________   PROCEDURES  Procedure(s) performed: None  Procedures  Critical Care performed: Yes ____________________________________________   INITIAL IMPRESSION / ASSESSMENT AND PLAN / ED COURSE  Pertinent labs & imaging results that were available during my care of the patient were  reviewed by me and considered in my medical decision making (see chart for details).  56 y.o. male with one year progressively worsening symptoms including diarrhea, diffuse abdominal pain, nausea without vomiting, who is hypotensive on arrival to the emergency department. I am concerned about a chronic process, including malignancy, especially given the patient's long alcohol history. Acute infection is also possible although the patient is not febrile been going on for a long time. We'll get a CT scan of the abdomen, and replete the patient's intervascular volume with normal saline. We will also check his blood counts, electrolytes, and get stool studies. I anticipate admission for further evaluation and treatment.  ----------------------------------------- 11:02 PM on 11/30/2016 -----------------------------------------  Initially, the patient's blood pressure did respond to fluids, but his last blood pressure this time is 91/68. I will plan to re-order additional fluid. His laboratory studies do show hyponatremia with hyperglycemia but no DKA. He does have glucose urea without ketonuria and his urine. He has significant number of white blood cells and leukocyte esterase but no bacteria. Given his hypotension and white blood cells in his urine, I have ordered Zosyn while I am awaiting his CT scan. He does have a blood alcohol level 191 today. The patient's troponin is negative.  The patient's CT head does not show any acute intracranial process. His CT abdomen shows some lymph nodes that are likely reactive but no other acute pathology.  The patient does have a progressive anemia, and given his diarrhea, weight loss, a colonoscopy is indicated. I'll plan to admit the patient at this time for further evaluation and treatment.  CRITICAL CARE Performed by: Eula Listen   Total critical care time: 45 minutes  Critical care time was exclusive of separately billable procedures and treating  other patients.  Critical care was necessary to treat or prevent imminent or life-threatening deterioration.  Critical care was time spent personally by me on the following activities: development of treatment plan with patient and/or surrogate as well as nursing, discussions with consultants, evaluation of patient's response to treatment, examination of patient, obtaining history from patient or surrogate, ordering and performing treatments and interventions, ordering and review of laboratory studies, ordering and review of radiographic studies, pulse oximetry and re-evaluation of patient's condition.  ____________________________________________  FINAL CLINICAL IMPRESSION(S) / ED DIAGNOSES  Final diagnoses:  Hyperglycemia  Hyponatremia  Syncope, unspecified syncope type  Alcoholic intoxication without complication (HCC)  Hypotension, unspecified hypotension type         NEW MEDICATIONS STARTED DURING THIS VISIT:  New Prescriptions   No medications on file      Eula Listen, MD 11/30/16 2307

## 2016-11-30 NOTE — ED Notes (Signed)
Pt brought directly back from triage d/t hypotension. Pt c/o weakness for a few days, diarrhea (loose and watery stool, 10 episodes a day) for "a year or two." Pt reports increase in frequency. Denies seeing blood in stool. Has been seeing PCP for diarrhea. +nausea, x2 emesis today.

## 2016-11-30 NOTE — ED Notes (Signed)
Patient @ XR/CT

## 2016-11-30 NOTE — ED Notes (Signed)
Patient r/f XR/CT

## 2016-12-01 ENCOUNTER — Observation Stay (HOSPITAL_BASED_OUTPATIENT_CLINIC_OR_DEPARTMENT_OTHER)
Admit: 2016-12-01 | Discharge: 2016-12-01 | Disposition: A | Discharge status: Home / Self Care | Payer: Medicaid Other | Attending: Internal Medicine | Admitting: Internal Medicine

## 2016-12-01 DIAGNOSIS — K621 Rectal polyp: Secondary | ICD-10-CM | POA: Diagnosis not present

## 2016-12-01 DIAGNOSIS — I351 Nonrheumatic aortic (valve) insufficiency: Secondary | ICD-10-CM | POA: Diagnosis not present

## 2016-12-01 DIAGNOSIS — K529 Noninfective gastroenteritis and colitis, unspecified: Secondary | ICD-10-CM | POA: Diagnosis not present

## 2016-12-01 DIAGNOSIS — D122 Benign neoplasm of ascending colon: Secondary | ICD-10-CM | POA: Diagnosis not present

## 2016-12-01 DIAGNOSIS — D490 Neoplasm of unspecified behavior of digestive system: Secondary | ICD-10-CM | POA: Diagnosis not present

## 2016-12-01 DIAGNOSIS — R55 Syncope and collapse: Secondary | ICD-10-CM | POA: Diagnosis present

## 2016-12-01 LAB — GASTROINTESTINAL PANEL BY PCR, STOOL (REPLACES STOOL CULTURE)

## 2016-12-01 LAB — CBC
HEMATOCRIT: 27.9 % — AB (ref 40.0–52.0)
Hemoglobin: 9.3 g/dL — ABNORMAL LOW (ref 13.0–18.0)
MCH: 32.4 pg (ref 26.0–34.0)
MCHC: 33.3 g/dL (ref 32.0–36.0)
MCV: 97.2 fL (ref 80.0–100.0)
PLATELETS: 142 10*3/uL — AB (ref 150–440)
RBC: 2.87 MIL/uL — ABNORMAL LOW (ref 4.40–5.90)
RDW: 14.6 % — AB (ref 11.5–14.5)
WBC: 5 10*3/uL (ref 3.8–10.6)

## 2016-12-01 LAB — BASIC METABOLIC PANEL
Anion gap: 4 — ABNORMAL LOW (ref 5–15)
BUN: 13 mg/dL (ref 6–20)
CHLORIDE: 110 mmol/L (ref 101–111)
CO2: 22 mmol/L (ref 22–32)
CREATININE: 0.71 mg/dL (ref 0.61–1.24)
Calcium: 7.3 mg/dL — ABNORMAL LOW (ref 8.9–10.3)
GFR calc Af Amer: >60 mL/min (ref >60)
GFR calc non Af Amer: >60 mL/min (ref >60)
GLUCOSE: 197 mg/dL — AB (ref 65–99)
POTASSIUM: 2.9 mmol/L — AB (ref 3.5–5.1)
SODIUM: 136 mmol/L (ref 135–145)

## 2016-12-01 LAB — GLUCOSE, CAPILLARY
GLUCOSE-CAPILLARY: 149 mg/dL — AB (ref 65–99)
Glucose-Capillary: 224 mg/dL — ABNORMAL HIGH (ref 65–99)
Glucose-Capillary: 211 mg/dL — ABNORMAL HIGH (ref 65–99)
Glucose-Capillary: 133 mg/dL — ABNORMAL HIGH (ref 65–99)
Glucose-Capillary: 292 mg/dL — ABNORMAL HIGH (ref 65–99)

## 2016-12-01 LAB — ECHOCARDIOGRAM COMPLETE
Height: 68 in
Weight: 1947.21 oz

## 2016-12-01 LAB — TROPONIN I
Troponin I: <0.03 ng/mL (ref <0.03)
Troponin I: <0.03 ng/mL (ref <0.03)

## 2016-12-01 LAB — C DIFFICILE QUICK SCREEN W PCR REFLEX
C DIFFICILE (CDIFF) TOXIN: NEGATIVE
C Diff antigen: NEGATIVE
C Diff interpretation: NOT DETECTED

## 2016-12-01 MED ORDER — FOLIC ACID 1 MG PO TABS
1.0000 mg | ORAL_TABLET | Freq: Every day | ORAL | Status: DC
  Administered 2016-12-01 – 2016-12-04 (×3): 1 mg via ORAL
  Filled 2016-12-01 (×3): qty 1

## 2016-12-01 MED ORDER — ONDANSETRON HCL 4 MG PO TABS: 4.0000 mg | ORAL_TABLET | Freq: Four times a day (QID) | ORAL | Status: DC | PRN

## 2016-12-01 MED ORDER — MORPHINE SULFATE (PF) 2 MG/ML IV SOLN
2.0000 mg | INTRAVENOUS | Status: DC | PRN
  Administered 2016-12-01 – 2016-12-04 (×8): 2 mg via INTRAVENOUS
  Filled 2016-12-01 (×7): qty 1

## 2016-12-01 MED ORDER — LORAZEPAM 1 MG PO TABS
0.0000 mg | ORAL_TABLET | Freq: Two times a day (BID) | ORAL | Status: DC
Start: 2016-12-03 — End: 2016-12-04

## 2016-12-01 MED ORDER — GABAPENTIN 100 MG PO CAPS
100.0000 mg | ORAL_CAPSULE | Freq: Three times a day (TID) | ORAL | Status: DC
  Administered 2016-12-01 – 2016-12-04 (×8): 100 mg via ORAL
  Filled 2016-12-01 (×8): qty 1

## 2016-12-01 MED ORDER — ONDANSETRON HCL 4 MG/2ML IJ SOLN
4.0000 mg | Freq: Four times a day (QID) | INTRAMUSCULAR | Status: DC | PRN
  Administered 2016-12-02: 4 mg via INTRAVENOUS
  Filled 2016-12-01: qty 2

## 2016-12-01 MED ORDER — ACETAMINOPHEN 650 MG RE SUPP: 650.0000 mg | Freq: Four times a day (QID) | RECTAL | Status: DC | PRN

## 2016-12-01 MED ORDER — LORAZEPAM 2 MG/ML IJ SOLN: 1.0000 mg | Freq: Four times a day (QID) | INTRAMUSCULAR | Status: AC | PRN

## 2016-12-01 MED ORDER — OXYCODONE HCL 5 MG PO TABS
10.0000 mg | ORAL_TABLET | Freq: Three times a day (TID) | ORAL | Status: DC | PRN
Start: 2016-12-01 — End: 2016-12-04
  Administered 2016-12-01 – 2016-12-04 (×7): 10 mg via ORAL
  Filled 2016-12-01 (×7): qty 2

## 2016-12-01 MED ORDER — LORAZEPAM 1 MG PO TABS: 1.0000 mg | ORAL_TABLET | Freq: Four times a day (QID) | ORAL | Status: AC | PRN

## 2016-12-01 MED ORDER — SODIUM CHLORIDE 0.9 % IV SOLN
INTRAVENOUS | Status: DC
  Administered 2016-12-01: 04:00:00 via INTRAVENOUS

## 2016-12-01 MED ORDER — PRAVASTATIN SODIUM 40 MG PO TABS
40.0000 mg | ORAL_TABLET | Freq: Every day | ORAL | Status: DC
  Administered 2016-12-01 – 2016-12-04 (×3): 40 mg via ORAL
  Filled 2016-12-01: qty 2
  Filled 2016-12-01 (×2): qty 1

## 2016-12-01 MED ORDER — ADULT MULTIVITAMIN W/MINERALS CH
1.0000 | ORAL_TABLET | Freq: Every day | ORAL | Status: DC
  Administered 2016-12-01 – 2016-12-04 (×3): 1 via ORAL
  Filled 2016-12-01 (×3): qty 1

## 2016-12-01 MED ORDER — MORPHINE SULFATE (PF) 2 MG/ML IV SOLN
INTRAVENOUS | Status: AC
  Filled 2016-12-01: qty 1

## 2016-12-01 MED ORDER — ACETAMINOPHEN 325 MG PO TABS: 650.0000 mg | ORAL_TABLET | Freq: Four times a day (QID) | ORAL | Status: DC | PRN

## 2016-12-01 MED ORDER — LORAZEPAM 1 MG PO TABS
0.0000 mg | ORAL_TABLET | Freq: Four times a day (QID) | ORAL | Status: AC
  Administered 2016-12-01: 1 mg via ORAL
  Filled 2016-12-01: qty 1

## 2016-12-01 MED ORDER — PANTOPRAZOLE SODIUM 40 MG PO TBEC
40.0000 mg | DELAYED_RELEASE_TABLET | Freq: Every day | ORAL | Status: DC
  Administered 2016-12-01 – 2016-12-03 (×2): 40 mg via ORAL
  Filled 2016-12-01 (×2): qty 1

## 2016-12-01 MED ORDER — POLYETHYLENE GLYCOL 3350 17 GM/SCOOP PO POWD
1.0000 | Freq: Once | ORAL | Status: AC
  Administered 2016-12-01: 255 g via ORAL
  Filled 2016-12-01: qty 255

## 2016-12-01 MED ORDER — PANCRELIPASE (LIP-PROT-AMYL) 12000-38000 UNITS PO CPEP: 12000.0000 [IU] | ORAL_CAPSULE | Freq: Three times a day (TID) | ORAL | Status: DC

## 2016-12-01 MED ORDER — PREGABALIN 25 MG PO CAPS
25.0000 mg | ORAL_CAPSULE | Freq: Three times a day (TID) | ORAL | Status: DC
  Administered 2016-12-01 – 2016-12-04 (×8): 25 mg via ORAL
  Filled 2016-12-01 (×8): qty 1

## 2016-12-01 MED ORDER — PANCRELIPASE (LIP-PROT-AMYL) 12000-38000 UNITS PO CPEP
12000.0000 [IU] | ORAL_CAPSULE | Freq: Three times a day (TID) | ORAL | Status: DC
  Administered 2016-12-01: 12000 [IU] via ORAL
  Filled 2016-12-01: qty 1

## 2016-12-01 MED ORDER — INSULIN ASPART 100 UNIT/ML ~~LOC~~ SOLN
0.0000 [IU] | Freq: Three times a day (TID) | SUBCUTANEOUS | Status: DC
  Administered 2016-12-01: 5 [IU] via SUBCUTANEOUS
  Administered 2016-12-01: 1 [IU] via SUBCUTANEOUS
  Administered 2016-12-01: 3 [IU] via SUBCUTANEOUS
  Administered 2016-12-01: 1 [IU] via SUBCUTANEOUS
  Administered 2016-12-01 – 2016-12-02 (×2): 3 [IU] via SUBCUTANEOUS
  Administered 2016-12-02: 2 [IU] via SUBCUTANEOUS
  Administered 2016-12-03: 5 [IU] via SUBCUTANEOUS
  Administered 2016-12-03: 1 [IU] via SUBCUTANEOUS
  Administered 2016-12-03 – 2016-12-04 (×2): 2 [IU] via SUBCUTANEOUS
  Filled 2016-12-01: qty 2
  Filled 2016-12-01: qty 1
  Filled 2016-12-01: qty 2
  Filled 2016-12-01: qty 3
  Filled 2016-12-01 (×2): qty 1
  Filled 2016-12-01: qty 5
  Filled 2016-12-01: qty 2
  Filled 2016-12-01 (×2): qty 5
  Filled 2016-12-01 (×2): qty 3

## 2016-12-01 MED ORDER — HYOSCYAMINE SULFATE 0.125 MG PO TABS
0.1250 mg | ORAL_TABLET | ORAL | Status: DC | PRN
  Filled 2016-12-01: qty 1

## 2016-12-01 MED ORDER — POTASSIUM CHLORIDE CRYS ER 20 MEQ PO TBCR
40.0000 meq | EXTENDED_RELEASE_TABLET | Freq: Once | ORAL | Status: AC
  Administered 2016-12-01: 40 meq via ORAL
  Filled 2016-12-01: qty 2

## 2016-12-01 MED ORDER — THIAMINE HCL 100 MG/ML IJ SOLN: 100.0000 mg | Freq: Every day | INTRAMUSCULAR | Status: DC

## 2016-12-01 MED ORDER — DIPHENHYDRAMINE HCL 25 MG PO CAPS
25.0000 mg | ORAL_CAPSULE | Freq: Three times a day (TID) | ORAL | Status: DC | PRN
  Administered 2016-12-01: 25 mg via ORAL
  Filled 2016-12-01: qty 1

## 2016-12-01 MED ORDER — PNEUMOCOCCAL VAC POLYVALENT 25 MCG/0.5ML IJ INJ: 0.5000 mL | INJECTION | INTRAMUSCULAR | Status: DC

## 2016-12-01 MED ORDER — VITAMIN B-1 100 MG PO TABS
100.0000 mg | ORAL_TABLET | Freq: Every day | ORAL | Status: DC
  Administered 2016-12-01 – 2016-12-04 (×3): 100 mg via ORAL
  Filled 2016-12-01 (×3): qty 1

## 2016-12-01 MED ORDER — ENOXAPARIN SODIUM 40 MG/0.4ML ~~LOC~~ SOLN: 40.0000 mg | SUBCUTANEOUS | Status: DC

## 2016-12-01 NOTE — Consult Note (Signed)
Lucilla Lame, MD [facility]  [address], [place], [place] [address] Phone: [phone] Fax : [phone]  Consultation  Referring Provider:     Dr. Verdell Carmine Primary Care Physician:  [name], MD Primary Gastroenterologist:  Dr. Gustavo Lah         Reason for Consultation:     Diarrhea and anemia  Date of Admission:  11/30/2016 Date of Consultation:  12/01/2016         HPI:   [name] is a 56 y.o. male Reports that he has had diarrhea for over a year.  The patient was seen at the [facility] clinic for the diarrhea and states he was set up for a colonoscopy. The patient thinks that his colonoscopy was supposed to be done tomorrow although it looks like his procedures were supposedly back in February of this year. The patient reports that he has had weight loss with his diarrhea. The patient's C. Difficile and GI panel were negative. The patient reported that he does not drink but his alcohol level was elevated a year ago 3 months ago and on admission.The patient's hemoglobin on admission was 11 and went down to 9.3 this morning.  Past Medical History:  Diagnosis Date  . Anemia   . BPH without obstruction/lower urinary tract symptoms   . Cellulitis of scrotum   . Cyst of prostate 05/13/2015  . Diabetes ([place])   . Difficulty urinating   . Epididymoorchitis   . ETOH abuse   . Fracture of right tibia and fibula   . GERD (gastroesophageal reflux disease)   . GI bleed   . Pancreatitis   . Trichomoniasis     Past Surgical History:  Procedure Laterality Date  . FRACTURE SURGERY     TIBIA AND FIBULA  . FRACTURE SURGERY    . resection of pancreas    . SCROTAL EXPLORATION      Prior to Admission medications   Medication Sig Start Date End Date Taking? Authorizing Provider  LANTUS SOLOSTAR 100 UNIT/ML Solostar Pen Inject 10 Units into the skin 2 (two) times daily. 07/02/16  Yes Harvest Dark, MD  ciprofloxacin (CIPRO) 500 MG tablet Take 1 tablet (500 mg total) by  mouth 2 (two) times daily. 08/17/16   Paulette Blanch, MD  cyclobenzaprine (FLEXERIL) 10 MG tablet take 1 tablet by mouth every 12 hours 02/15/16   [provider]  cyclobenzaprine (FLEXERIL) 5 MG tablet Take 5 mg by mouth 3 (three) times daily as needed for muscle spasms.    [provider]  diazepam (VALIUM) 5 MG tablet Take 5 mg by mouth. 06/06/15   [provider]  gabapentin (NEURONTIN) 100 MG capsule Take 100 mg by mouth 3 (three) times daily.    [provider]  gabapentin (NEURONTIN) 100 MG capsule Take 100 mg by mouth. 05/25/15   [provider]  hyoscyamine (LEVSIN, ANASPAZ) 0.125 MG tablet Take 0.125 mg by mouth every 4 (four) hours as needed.    [provider]  hyoscyamine (LEVSIN, ANASPAZ) 0.125 MG tablet Take by mouth. 04/29/16   [provider]  lipase/protease/amylase (CREON) 12000 units CPEP capsule Take 12,000 Units by mouth.    [provider]  lisinopril (PRINIVIL,ZESTRIL) 10 MG tablet Take 10 mg by mouth daily.    [provider]  loperamide (IMODIUM A-D) 2 MG tablet Take 1 tablet (2 mg total) by mouth 4 (four) times daily as needed for diarrhea or loose stools. 07/02/16   Paduchowski,  Lennette Bihari, MD  Naloxone HCl (EVZIO) 0.4 MG/0.4ML SOAJ  05/07/15   [provider]  NOVOLOG FLEXPEN 100 UNIT/ML FlexPen Inject 48 Units into the skin at bedtime.  05/16/15   [provider]  Oxycodone HCl 10 MG TABS Take 10 mg by mouth every 8 (eight) hours as needed (for pain.).  02/10/15   [provider]  pantoprazole (PROTONIX) 40 MG tablet Take 40 mg by mouth daily.    [provider]  pravastatin (PRAVACHOL) 40 MG tablet Take 40 mg by mouth daily.    [provider]  pregabalin (LYRICA) 25 MG capsule Take 1 capsule by mouth 3 (three) times daily. 02/21/16   [provider]  Vitamins/Minerals TABS Take by mouth. 05/14/15   [provider]    Family History    Problem Relation Age of Onset  . Cancer Father      Social History  Substance Use Topics  . Smoking status: Current Every Day Smoker    Packs/day: 0.00  . Smokeless tobacco: Never Used  . Alcohol use 0.0 oz/week     Comment: occ    Allergies as of 11/30/2016 - Review Complete 11/30/2016  Allergen Reaction Noted  . Aspirin Itching and Other (See Comments) 08/11/2016    Review of Systems:    All systems reviewed and negative except where noted in HPI.   Physical Exam:  Vital signs in last 24 hours: Temp:  [98.8 F (37.1 C)] 98.8 F (37.1 C) (06/03 2008) Pulse Rate:  [30-87] 50 (06/04 1155) Resp:  [9-24] 18 (06/04 1155) BP: (73-124)/(48-96) 117/67 (06/04 1155) SpO2:  [96 %-100 %] 100 % (06/04 1155) Weight:  [110 lb (49.9 kg)-121 lb 11.2 oz (55.2 kg)] 121 lb 11.2 oz (55.2 kg) (06/04 0256) Last BM Date: 11/30/16 General:   Pleasant, cooperative in NAD Head:  Normocephalic and atraumatic. Eyes:   No icterus.   Conjunctiva pink. PERRLA. Ears:  Normal auditory acuity. Neck:  Supple; no masses or thyroidomegaly Lungs: Respirations even and unlabored. Lungs clear to auscultation bilaterally.   No wheezes, crackles, or rhonchi.  Heart:  Regular rate and rhythm;  Without murmur, clicks, rubs or gallops Abdomen:  Soft, nondistended, nontender. Normal bowel sounds. No appreciable masses or hepatomegaly.  No rebound or guarding.  Rectal:  Not performed. Msk:  Symmetrical without gross deformities.    Extremities:  Without edema, cyanosis or clubbing. Neurologic:  Alert and oriented x3;  grossly normal neurologically. Skin:  Intact without significant lesions or rashes. Cervical Nodes:  No significant cervical adenopathy. Psych:  Alert and cooperative. Normal affect.  LAB RESULTS:  Recent Labs  11/30/16 2033 12/01/16 0927  WBC 6.8 5.0  HGB 11.0* 9.3*  HCT 32.0* 27.9*  PLT 183 142*   BMET  Recent Labs  11/30/16 2033 12/01/16 0927  NA 130* 136  K 3.5 2.9*  CL 100*  110  CO2 20* 22  GLUCOSE 422* 197*  BUN 16 13  CREATININE 0.99 0.71  CALCIUM 8.1* 7.3*   LFT  Recent Labs  11/30/16 2033  PROT 5.6*  ALBUMIN 2.7*  AST 74*  ALT 41  ALKPHOS 165*  BILITOT 0.7   PT/INR No results for input(s): LABPROT, INR in the last 72 hours.  STUDIES: Dg Chest 2 View  Result Date: 11/30/2016 CLINICAL DATA:  Weakness EXAM: CHEST  2 VIEW COMPARISON:  08/16/2016 FINDINGS: Cardiac shadow is within normal limits. The lungs are well aerated bilaterally. Old rib fractures are seen bilaterally with healing. No acute  abnormality is noted. IMPRESSION: No acute abnormality noted. Electronically Signed   By: Inez Catalina M.D.   On: 11/30/2016 23:03   Ct Head Wo Contrast  Result Date: 11/30/2016 CLINICAL DATA:  Recurrent syncope, hypotension and weakness for several days. Nausea and emesis today. EXAM: CT HEAD WITHOUT CONTRAST TECHNIQUE: Contiguous axial images were obtained from the base of the skull through the vertex without intravenous contrast. COMPARISON:  08/16/2016 head CT FINDINGS: Brain: Chronic stable small vessel ischemic changes of periventricular white matter. No large vascular territory infarction. Idiopathic basal ganglial calcifications are noted bilaterally left greater than right. No acute intracranial hemorrhage, midline shift or edema. No extra-axial fluid collections. No hydrocephalus. Basal cisterns and fourth ventricle are midline. Vascular: No hyperdense vessels or unexpected calcifications. Skull: No primary bone lesion.  No skull fracture. Sinuses/Orbits: Intact orbits and globes. No acute abnormality of the visualized paranasal sinuses and mastoids. Other: None IMPRESSION: Chronic stable small vessel ischemic disease of periventricular white matter. No acute intracranial abnormality. Electronically Signed   By: Ashley Royalty M.D.   On: 11/30/2016 22:51   Ct Abdomen Pelvis W Contrast  Result Date: 11/30/2016 CLINICAL DATA:  Chronic diarrhea and bloody stools  EXAM: CT ABDOMEN AND PELVIS WITH CONTRAST TECHNIQUE: Multidetector CT imaging of the abdomen and pelvis was performed using the standard protocol following bolus administration of intravenous contrast. CONTRAST:  25mL ISOVUE-300 IOPAMIDOL (ISOVUE-300) INJECTION 61% COMPARISON:  04/04/2016 FINDINGS: Lower chest: Lung bases are free of acute infiltrate or sizable effusion. Hepatobiliary: Hyperenhancing lesion is again seen in the posterior aspect of the right lobe consistent with hemangioma. This is stable from the prior exam. No gallstones, gallbladder wall thickening, or biliary dilatation. Pancreas: Diffuse pancreatic calcifications are noted consistent with chronic pancreatitis. No acute inflammatory changes are seen. Spleen: Normal in size without focal abnormality. Adrenals/Urinary Tract: The adrenal glands are within normal limits. Kidneys demonstrate scattered hypodensities likely related to cysts. No renal calculi or obstructive changes are seen. The bladder is well distended. Stomach/Bowel: Stomach is within normal limits. Appendix appears normal. No evidence of bowel wall thickening, distention, or inflammatory changes. Postsurgical changes are noted in the small bowel in the left mid abdomen. Vascular/Lymphatic: Aortic atherosclerosis. Few small gastrohepatic ligament lymph nodes are noted. The largest of these measures 11 mm in short axis best seen on image number 22 of series 2. This is slightly larger than that seen on the prior exam. Reproductive: Prostate again demonstrates a 2.1 cm cyst similar to that seen on the prior exam. Other: No abdominal wall hernia or abnormality. No abdominopelvic ascites. Musculoskeletal: Healed left rib fracture is noted. Degenerative changes of the lumbar spine are noted. IMPRESSION: Stable chronic changes as described above. Small gastrohepatic ligament lymph nodes slightly more prominent than that seen on the prior exam likely of a reactive nature. Electronically  Signed   By: Inez Catalina M.D.   On: 11/30/2016 23:00      Impression / Plan:   [name] is a 56 y.o. y/o male with Chronic diarrhea for more than a year with weight loss and a drop in his hemoglobin from 11 to 9.3 and 1 daily. The patient will be set up for an EGD and colonoscopy for tomorrow.  The patient was adamant about eating and had refused a colonoscopy and EGD at first and stated that he wanted to eat but then change his mind and said he would undergo the procedures tomorrow. I have discussed risks & benefits which include, but  are not limited to, bleeding, infection, perforation & drug reaction.  The patient agrees with this plan & written consent will be obtained.     Thank you for involving me in the care of this patient.      LOS: 0 days   Lucilla Lame, MD  12/01/2016, 7:20 PM   Note: This dictation was prepared with Dragon dictation along with smaller phrase technology. Any transcriptional errors that result from this process are unintentional.

## 2016-12-01 NOTE — Progress Notes (Signed)
[facility] [place] NAME: [name]    MR#:  [id number]  DATE OF BIRTH:  29-Apr-1961  SUBJECTIVE:   Patient here due to chronic diarrhea, weakness and also noted to have a syncopal episode. Still having diarrhea but asking for food. Seen by gastroenterology and plan for upper GI endoscopy and colonoscopy tomorrow. No further syncopal episodes. No chest pain, shortness of breath, nausea, vomiting.  REVIEW OF SYSTEMS:    Review of Systems  Constitutional: Negative for chills and fever.  HENT: Negative for congestion and tinnitus.   Eyes: Negative for blurred vision and double vision.  Respiratory: Negative for cough, shortness of breath and wheezing.   Cardiovascular: Negative for chest pain, orthopnea and PND.  Gastrointestinal: Positive for diarrhea. Negative for abdominal pain, nausea and vomiting.  Genitourinary: Negative for dysuria and hematuria.  Neurological: Negative for dizziness, sensory change and focal weakness.  All other systems reviewed and are negative.   Nutrition: Clear Liquids Tolerating Diet: Yes Tolerating PT: Await Eval.     DRUG ALLERGIES:   Allergies  Allergen Reactions  . Aspirin Itching and Other (See Comments)    Reaction: abdominal pain    VITALS:  Blood pressure 117/67, pulse (!) 50, temperature 98.8 F (37.1 C), temperature source Oral, resp. rate 18, height 5\' 8"  (1.727 m), weight 55.2 kg (121 lb 11.2 oz), SpO2 100 %.  PHYSICAL EXAMINATION:   Physical Exam  GENERAL:  56 y.o.-year-old thin patient lying in bed in no acute distress.  EYES: Pupils equal, round, reactive to light and accommodation. No scleral icterus. Extraocular muscles intact.  HEENT: Head atraumatic, normocephalic. Oropharynx and nasopharynx clear.  NECK:  Supple, no jugular venous distention. No thyroid enlargement, no tenderness.  LUNGS: Normal breath sounds bilaterally, no wheezing, rales, rhonchi. No use of accessory muscles  of respiration.  CARDIOVASCULAR: S1, S2 normal. No murmurs, rubs, or gallops.  ABDOMEN: Soft, nontender, nondistended. Bowel sounds present. No organomegaly or mass.  EXTREMITIES: No cyanosis, clubbing or edema b/l.    NEUROLOGIC: Cranial nerves II through XII are intact. No focal Motor or sensory deficits b/l.   PSYCHIATRIC: The patient is alert and oriented x 3.  SKIN: No obvious rash, lesion, or ulcer.    LABORATORY PANEL:   CBC  Recent Labs Lab 12/01/16 0927  WBC 5.0  HGB 9.3*  HCT 27.9*  PLT 142*   ------------------------------------------------------------------------------------------------------------------  Chemistries   Recent Labs Lab 11/30/16 2033 12/01/16 0927  NA 130* 136  K 3.5 2.9*  CL 100* 110  CO2 20* 22  GLUCOSE 422* 197*  BUN 16 13  CREATININE 0.99 0.71  CALCIUM 8.1* 7.3*  AST 74*  --   ALT 41  --   ALKPHOS 165*  --   BILITOT 0.7  --    ------------------------------------------------------------------------------------------------------------------  Cardiac Enzymes  Recent Labs Lab 12/01/16 0927  TROPONINI <0.03   ------------------------------------------------------------------------------------------------------------------  RADIOLOGY:  Dg Chest 2 View  Result Date: 11/30/2016 CLINICAL DATA:  Weakness EXAM: CHEST  2 VIEW COMPARISON:  08/16/2016 FINDINGS: Cardiac shadow is within normal limits. The lungs are well aerated bilaterally. Old rib fractures are seen bilaterally with healing. No acute abnormality is noted. IMPRESSION: No acute abnormality noted. Electronically Signed   By: Inez Catalina M.D.   On: 11/30/2016 23:03   Ct Head Wo Contrast  Result Date: 11/30/2016 CLINICAL DATA:  Recurrent syncope, hypotension and weakness for several days. Nausea and emesis today. EXAM: CT HEAD WITHOUT CONTRAST TECHNIQUE: Contiguous  axial images were obtained from the base of the skull through the vertex without intravenous contrast. COMPARISON:   08/16/2016 head CT FINDINGS: Brain: Chronic stable small vessel ischemic changes of periventricular white matter. No large vascular territory infarction. Idiopathic basal ganglial calcifications are noted bilaterally left greater than right. No acute intracranial hemorrhage, midline shift or edema. No extra-axial fluid collections. No hydrocephalus. Basal cisterns and fourth ventricle are midline. Vascular: No hyperdense vessels or unexpected calcifications. Skull: No primary bone lesion.  No skull fracture. Sinuses/Orbits: Intact orbits and globes. No acute abnormality of the visualized paranasal sinuses and mastoids. Other: None IMPRESSION: Chronic stable small vessel ischemic disease of periventricular white matter. No acute intracranial abnormality. Electronically Signed   By: Ashley Royalty M.D.   On: 11/30/2016 22:51   Ct Abdomen Pelvis W Contrast  Result Date: 11/30/2016 CLINICAL DATA:  Chronic diarrhea and bloody stools EXAM: CT ABDOMEN AND PELVIS WITH CONTRAST TECHNIQUE: Multidetector CT imaging of the abdomen and pelvis was performed using the standard protocol following bolus administration of intravenous contrast. CONTRAST:  56mL ISOVUE-300 IOPAMIDOL (ISOVUE-300) INJECTION 61% COMPARISON:  04/04/2016 FINDINGS: Lower chest: Lung bases are free of acute infiltrate or sizable effusion. Hepatobiliary: Hyperenhancing lesion is again seen in the posterior aspect of the right lobe consistent with hemangioma. This is stable from the prior exam. No gallstones, gallbladder wall thickening, or biliary dilatation. Pancreas: Diffuse pancreatic calcifications are noted consistent with chronic pancreatitis. No acute inflammatory changes are seen. Spleen: Normal in size without focal abnormality. Adrenals/Urinary Tract: The adrenal glands are within normal limits. Kidneys demonstrate scattered hypodensities likely related to cysts. No renal calculi or obstructive changes are seen. The bladder is well distended.  Stomach/Bowel: Stomach is within normal limits. Appendix appears normal. No evidence of bowel wall thickening, distention, or inflammatory changes. Postsurgical changes are noted in the small bowel in the left mid abdomen. Vascular/Lymphatic: Aortic atherosclerosis. Few small gastrohepatic ligament lymph nodes are noted. The largest of these measures 11 mm in short axis best seen on image number 22 of series 2. This is slightly larger than that seen on the prior exam. Reproductive: Prostate again demonstrates a 2.1 cm cyst similar to that seen on the prior exam. Other: No abdominal wall hernia or abnormality. No abdominopelvic ascites. Musculoskeletal: Healed left rib fracture is noted. Degenerative changes of the lumbar spine are noted. IMPRESSION: Stable chronic changes as described above. Small gastrohepatic ligament lymph nodes slightly more prominent than that seen on the prior exam likely of a reactive nature. Electronically Signed   By: Inez Catalina M.D.   On: 11/30/2016 23:00     ASSESSMENT AND PLAN:   56 year old male with past medical history of chronic pancreatitis, alcohol abuse, diabetes, BPH, GERD, previous history of GI bleed who presents to the hospital due to a syncopal episode and also ongoing diarrhea.  1. Syncope-unclear source of syncope. Likely related to orthostatic hypotension. -Observe overnight on telemetry, no acute arrhythmias noted. Cardiac enzymes have been negative so far. -CT head on admission negative for acute pathology. Continue IV fluids, recheck orthostatic vital signs today.  2. Diarrhea-chronic for the patient as he had it for years. Questionable related to chronic pancreatitis. -Seems like a secretory diarrhea as patient has symptoms despite eating. Seen by gastroenterology, plan for upper GI endoscopy and colonoscopy tomorrow. Continue supportive care for now.  3. History of alcohol abuse-continue CIWA protocol. - cont. Folate, Thiamine.   4. Hypokalemia -  will replace and repeat in a.m.  - Check  Mg. Level.   5. Hyperlipidemia-continue Pravachol.  6. Neuropathy-continue Lyrica, Neurontin.  7. GERD-continue Protonix.    All the records are reviewed and case discussed with Care Management/Social Worker. Management plans discussed with the patient, family and they are in agreement.  CODE STATUS: Full Code  DVT Prophylaxis: Lovenox  TOTAL TIME TAKING CARE OF THIS PATIENT: 30 minutes.   POSSIBLE D/C IN 1-2 DAYS, DEPENDING ON CLINICAL CONDITION.   Henreitta Leber M.D on 12/01/2016 at 2:54 PM  Between 7am to 6pm - Pager - [phone]  After 6pm go to [email or web address] - Proofreader  [facility]  Office  [phone]  CC: Primary care physician; [name], MD

## 2016-12-01 NOTE — H&P (Signed)
[facility] NAME: [name]    MR#:  [id number]  DATE OF BIRTH:  05/09/1961  DATE OF ADMISSION:  11/30/2016  PRIMARY CARE PHYSICIAN: [name], MD   REQUESTING/REFERRING PHYSICIAN: Mariea Clonts, MD  CHIEF COMPLAINT:   Chief Complaint  Patient presents with  . Dizziness  . Weakness  . Diarrhea    HISTORY OF PRESENT ILLNESS:  [name]  is a 56 y.o. male who presents with One year of chronic diarrhea and recurrent syncopal episodes. Patient states that he's been having long-standing diarrhea with recurrent syncopal episodes. He denies any cardiac symptoms to the syncopal episodes. He states sometimes he feels like he does get dehydrated as diarrhea. He typically has loose stool to sometimes watery diarrhea, in a waxing and waning fashion in terms of intensity, at its worst usually 3-4 times a day.  Patient states that when he has a syncopal episode he will typically feel lightheaded just prior to passing out. He denies any significant chest pain or palpitations or other cardiac symptoms. He denies any history of cardiac disease or prior heart attacks. Patient also has an elevated alcohol level today, and does endorse alcohol use. Hospitalists were called for admission and further evaluation  PAST MEDICAL HISTORY:   Past Medical History:  Diagnosis Date  . Anemia   . BPH without obstruction/lower urinary tract symptoms   . Cellulitis of scrotum   . Cyst of prostate 05/13/2015  . Diabetes ([place])   . Difficulty urinating   . Epididymoorchitis   . ETOH abuse   . Fracture of right tibia and fibula   . GERD (gastroesophageal reflux disease)   . GI bleed   . Pancreatitis   . Trichomoniasis     PAST SURGICAL HISTORY:   Past Surgical History:  Procedure Laterality Date  . FRACTURE SURGERY     TIBIA AND FIBULA  . FRACTURE SURGERY    . resection of pancreas    . SCROTAL EXPLORATION      SOCIAL HISTORY:   Social  History  Substance Use Topics  . Smoking status: Current Every Day Smoker    Packs/day: 0.00  . Smokeless tobacco: Never Used  . Alcohol use 0.0 oz/week     Comment: occ    FAMILY HISTORY:   Family History  Problem Relation Age of Onset  . Cancer Father     DRUG ALLERGIES:   Allergies  Allergen Reactions  . Aspirin Itching and Other (See Comments)    Reaction: abdominal pain    MEDICATIONS AT HOME:   Prior to Admission medications   Medication Sig Start Date End Date Taking? Authorizing Provider  LANTUS SOLOSTAR 100 UNIT/ML Solostar Pen Inject 10 Units into the skin 2 (two) times daily. 07/02/16  Yes Harvest Dark, MD  ciprofloxacin (CIPRO) 500 MG tablet Take 1 tablet (500 mg total) by mouth 2 (two) times daily. 08/17/16   Paulette Blanch, MD  cyclobenzaprine (FLEXERIL) 10 MG tablet take 1 tablet by mouth every 12 hours 02/15/16   [provider]  cyclobenzaprine (FLEXERIL) 5 MG tablet Take 5 mg by mouth 3 (three) times daily as needed for muscle spasms.    [provider]  diazepam (VALIUM) 5 MG tablet Take 5 mg by mouth. 06/06/15   [provider]  gabapentin (NEURONTIN) 100 MG capsule Take 100 mg by mouth 3 (three) times daily.    [provider]  gabapentin (NEURONTIN) 100 MG capsule  Take 100 mg by mouth. 05/25/15   [provider]  hyoscyamine (LEVSIN, ANASPAZ) 0.125 MG tablet Take 0.125 mg by mouth every 4 (four) hours as needed.    [provider]  hyoscyamine (LEVSIN, ANASPAZ) 0.125 MG tablet Take by mouth. 04/29/16   [provider]  lipase/protease/amylase (CREON) 12000 units CPEP capsule Take 12,000 Units by mouth.    [provider]  lisinopril (PRINIVIL,ZESTRIL) 10 MG tablet Take 10 mg by mouth daily.    [provider]  loperamide (IMODIUM A-D) 2 MG tablet Take 1 tablet (2 mg total) by mouth 4 (four) times daily as needed for diarrhea or loose stools. 07/02/16   Harvest Dark, MD   Naloxone HCl (EVZIO) 0.4 MG/0.4ML SOAJ  05/07/15   [provider]  NOVOLOG FLEXPEN 100 UNIT/ML FlexPen Inject 48 Units into the skin at bedtime.  05/16/15   [provider]  Oxycodone HCl 10 MG TABS Take 10 mg by mouth every 8 (eight) hours as needed (for pain.).  02/10/15   [provider]  pantoprazole (PROTONIX) 40 MG tablet Take 40 mg by mouth daily.    [provider]  pravastatin (PRAVACHOL) 40 MG tablet Take 40 mg by mouth daily.    [provider]  pregabalin (LYRICA) 25 MG capsule Take 1 capsule by mouth 3 (three) times daily. 02/21/16   [provider]  Vitamins/Minerals TABS Take by mouth. 05/14/15   [provider]    REVIEW OF SYSTEMS:  Review of Systems  Constitutional: Negative for chills, fever, malaise/fatigue and weight loss.  HENT: Negative for ear pain, hearing loss and tinnitus.   Eyes: Negative for blurred vision, double vision, pain and redness.  Respiratory: Negative for cough, hemoptysis and shortness of breath.   Cardiovascular: Negative for chest pain, palpitations, orthopnea and leg swelling.  Gastrointestinal: Positive for abdominal pain and diarrhea. Negative for constipation, nausea and vomiting.  Genitourinary: Negative for dysuria, frequency and hematuria.  Musculoskeletal: Negative for back pain, joint pain and neck pain.  Skin:       No acne, rash, or lesions  Neurological: Positive for loss of consciousness. Negative for dizziness, tremors, focal weakness and weakness.  Endo/Heme/Allergies: Negative for polydipsia. Does not bruise/bleed easily.  Psychiatric/Behavioral: Negative for depression. The patient is not nervous/anxious and does not have insomnia.      VITAL SIGNS:   Vitals:   11/30/16 2130 11/30/16 2131 11/30/16 2230 11/30/16 2330  BP: (!) 124/96  91/68 101/80  Pulse: 73 (!) 30 67 (!) 59  Resp: (!) 9 14 15 19   Temp:      TempSrc:      SpO2: 96% 96% 99% 100%  Weight:       Height:       Wt Readings from Last 3 Encounters:  11/30/16 49.9 kg (110 lb)  08/16/16 59 kg (130 lb)  08/12/16 56.7 kg (125 lb)    PHYSICAL EXAMINATION:  Physical Exam  Vitals reviewed. Constitutional: He is oriented to person, place, and time. He appears well-developed and well-nourished. No distress.  HENT:  Head: Normocephalic and atraumatic.  Dry mucous membranes  Eyes: Conjunctivae and EOM are normal. Pupils are equal, round, and reactive to light. No scleral icterus.  Neck: Normal range of motion. Neck supple. No JVD present. No thyromegaly present.  Cardiovascular: Normal rate, regular rhythm and intact distal pulses.  Exam reveals no gallop and no friction rub.   No murmur heard. Respiratory: Effort normal and breath sounds normal. No  respiratory distress. He has no wheezes. He has no rales.  GI: Soft. Bowel sounds are normal. He exhibits no distension. There is tenderness (Epigastric).  Musculoskeletal: Normal range of motion. He exhibits no edema.  No arthritis, no gout  Lymphadenopathy:    He has no cervical adenopathy.  Neurological: He is alert and oriented to person, place, and time. No cranial nerve deficit.  No dysarthria, no aphasia  Skin: Skin is warm and dry. No rash noted. No erythema.  Psychiatric: He has a normal mood and affect. His behavior is normal. Judgment and thought content normal.    LABORATORY PANEL:   CBC  Recent Labs Lab 11/30/16 2033  WBC 6.8  HGB 11.0*  HCT 32.0*  PLT 183   ------------------------------------------------------------------------------------------------------------------  Chemistries   Recent Labs Lab 11/30/16 2033  NA 130*  K 3.5  CL 100*  CO2 20*  GLUCOSE 422*  BUN 16  CREATININE 0.99  CALCIUM 8.1*  AST 74*  ALT 41  ALKPHOS 165*  BILITOT 0.7   ------------------------------------------------------------------------------------------------------------------  Cardiac Enzymes  Recent Labs Lab  11/30/16 2033  TROPONINI <0.03   ------------------------------------------------------------------------------------------------------------------  RADIOLOGY:  Dg Chest 2 View  Result Date: 11/30/2016 CLINICAL DATA:  Weakness EXAM: CHEST  2 VIEW COMPARISON:  08/16/2016 FINDINGS: Cardiac shadow is within normal limits. The lungs are well aerated bilaterally. Old rib fractures are seen bilaterally with healing. No acute abnormality is noted. IMPRESSION: No acute abnormality noted. Electronically Signed   By: Inez Catalina M.D.   On: 11/30/2016 23:03   Ct Head Wo Contrast  Result Date: 11/30/2016 CLINICAL DATA:  Recurrent syncope, hypotension and weakness for several days. Nausea and emesis today. EXAM: CT HEAD WITHOUT CONTRAST TECHNIQUE: Contiguous axial images were obtained from the base of the skull through the vertex without intravenous contrast. COMPARISON:  08/16/2016 head CT FINDINGS: Brain: Chronic stable small vessel ischemic changes of periventricular white matter. No large vascular territory infarction. Idiopathic basal ganglial calcifications are noted bilaterally left greater than right. No acute intracranial hemorrhage, midline shift or edema. No extra-axial fluid collections. No hydrocephalus. Basal cisterns and fourth ventricle are midline. Vascular: No hyperdense vessels or unexpected calcifications. Skull: No primary bone lesion.  No skull fracture. Sinuses/Orbits: Intact orbits and globes. No acute abnormality of the visualized paranasal sinuses and mastoids. Other: None IMPRESSION: Chronic stable small vessel ischemic disease of periventricular white matter. No acute intracranial abnormality. Electronically Signed   By: Ashley Royalty M.D.   On: 11/30/2016 22:51   Ct Abdomen Pelvis W Contrast  Result Date: 11/30/2016 CLINICAL DATA:  Chronic diarrhea and bloody stools EXAM: CT ABDOMEN AND PELVIS WITH CONTRAST TECHNIQUE: Multidetector CT imaging of the abdomen and pelvis was performed using  the standard protocol following bolus administration of intravenous contrast. CONTRAST:  63mL ISOVUE-300 IOPAMIDOL (ISOVUE-300) INJECTION 61% COMPARISON:  04/04/2016 FINDINGS: Lower chest: Lung bases are free of acute infiltrate or sizable effusion. Hepatobiliary: Hyperenhancing lesion is again seen in the posterior aspect of the right lobe consistent with hemangioma. This is stable from the prior exam. No gallstones, gallbladder wall thickening, or biliary dilatation. Pancreas: Diffuse pancreatic calcifications are noted consistent with chronic pancreatitis. No acute inflammatory changes are seen. Spleen: Normal in size without focal abnormality. Adrenals/Urinary Tract: The adrenal glands are within normal limits. Kidneys demonstrate scattered hypodensities likely related to cysts. No renal calculi or obstructive changes are seen. The bladder is well distended. Stomach/Bowel: Stomach is within normal limits. Appendix appears normal. No evidence of bowel wall thickening, distention, or  inflammatory changes. Postsurgical changes are noted in the small bowel in the left mid abdomen. Vascular/Lymphatic: Aortic atherosclerosis. Few small gastrohepatic ligament lymph nodes are noted. The largest of these measures 11 mm in short axis best seen on image number 22 of series 2. This is slightly larger than that seen on the prior exam. Reproductive: Prostate again demonstrates a 2.1 cm cyst similar to that seen on the prior exam. Other: No abdominal wall hernia or abnormality. No abdominopelvic ascites. Musculoskeletal: Healed left rib fracture is noted. Degenerative changes of the lumbar spine are noted. IMPRESSION: Stable chronic changes as described above. Small gastrohepatic ligament lymph nodes slightly more prominent than that seen on the prior exam likely of a reactive nature. Electronically Signed   By: Inez Catalina M.D.   On: 11/30/2016 23:00    EKG:   Orders placed or performed during the hospital encounter of  11/30/16  . EKG 12-Lead  . EKG 12-Lead    IMPRESSION AND PLAN:  Principal Problem:   Syncope - unclear whether this is due to cardiac source, or is more likely due to bouts of significant dehydration with his diarrhea. We will trend his cardiac enzymes tonight, get an echocardiogram and cardiology consult in the morning. Active Problems:   Chronic diarrhea - IV fluids for dehydration, C. difficile and GI panels pending, GI consult   Type 2 diabetes mellitus (HCC) - sliding scale insulin with corresponding glucose checks   GERD (gastroesophageal reflux disease) - home dose PPI   ETOH abuse - CIWA protocol   BPH (benign prostatic hyperplasia) - continue home meds  All the records are reviewed and case discussed with ED provider. Management plans discussed with the patient and/or family.  DVT PROPHYLAXIS: SubQ lovenox  GI PROPHYLAXIS: PPI  ADMISSION STATUS: Observation  CODE STATUS: Full Code Status History    Date Active Date Inactive Code Status Order ID Comments User Context   05/12/2015 10:39 PM 05/14/2015  5:23 PM Full Code [id number]  Lance Coon, MD Inpatient      TOTAL TIME TAKING CARE OF THIS PATIENT: 40 minutes.   Destiney Sanabia Bostwick 12/01/2016, 1:06 AM  [facility]  Office  [phone]  CC: Primary care physician; [name], MD  Note:  This document was prepared using Dragon voice recognition software and may include unintentional dictation errors.

## 2016-12-01 NOTE — Progress Notes (Signed)
*  PRELIMINARY RESULTS* Echocardiogram 2D Echocardiogram has been performed.  [name] 12/01/2016, 3:42 PM

## 2016-12-01 NOTE — Progress Notes (Signed)
Pt arrived via stretcher from ED accompanied by Rn. Significance other coming in from moving car. PT A&O. Telemetry monitor applied and called to CCMD. Yellow socks on and contract explained and signed

## 2016-12-01 NOTE — Progress Notes (Signed)
Text paged dr. Verdell Gardner to make aware patients potassium is 2.9. md ordered 40 meq po potassium x1.

## 2016-12-02 ENCOUNTER — Encounter: Payer: Self-pay | Admitting: *Deleted

## 2016-12-02 ENCOUNTER — Observation Stay: Payer: Medicaid Other | Admitting: Anesthesiology

## 2016-12-02 ENCOUNTER — Encounter
Admission: EM | Disposition: A | Discharge status: Home / Self Care | Payer: Self-pay | Source: Home / Self Care | Attending: Specialist

## 2016-12-02 ENCOUNTER — Other Ambulatory Visit: Payer: Self-pay

## 2016-12-02 ENCOUNTER — Ambulatory Visit: Payer: Medicaid Other | Admitting: Gastroenterology

## 2016-12-02 DIAGNOSIS — D122 Benign neoplasm of ascending colon: Secondary | ICD-10-CM

## 2016-12-02 DIAGNOSIS — D490 Neoplasm of unspecified behavior of digestive system: Secondary | ICD-10-CM | POA: Diagnosis not present

## 2016-12-02 DIAGNOSIS — E1142 Type 2 diabetes mellitus with diabetic polyneuropathy: Secondary | ICD-10-CM | POA: Diagnosis present

## 2016-12-02 DIAGNOSIS — E1165 Type 2 diabetes mellitus with hyperglycemia: Secondary | ICD-10-CM | POA: Diagnosis present

## 2016-12-02 DIAGNOSIS — I951 Orthostatic hypotension: Secondary | ICD-10-CM | POA: Diagnosis present

## 2016-12-02 DIAGNOSIS — I10 Essential (primary) hypertension: Secondary | ICD-10-CM | POA: Diagnosis present

## 2016-12-02 DIAGNOSIS — K529 Noninfective gastroenteritis and colitis, unspecified: Secondary | ICD-10-CM

## 2016-12-02 DIAGNOSIS — Y838 Other surgical procedures as the cause of abnormal reaction of the patient, or of later complication, without mention of misadventure at the time of the procedure: Secondary | ICD-10-CM | POA: Diagnosis present

## 2016-12-02 DIAGNOSIS — E871 Hypo-osmolality and hyponatremia: Secondary | ICD-10-CM | POA: Diagnosis present

## 2016-12-02 DIAGNOSIS — R680 Hypothermia, not associated with low environmental temperature: Secondary | ICD-10-CM | POA: Diagnosis present

## 2016-12-02 DIAGNOSIS — F10120 Alcohol abuse with intoxication, uncomplicated: Secondary | ICD-10-CM | POA: Diagnosis present

## 2016-12-02 DIAGNOSIS — D123 Benign neoplasm of transverse colon: Secondary | ICD-10-CM | POA: Diagnosis present

## 2016-12-02 DIAGNOSIS — T8119XA Other postprocedural shock, initial encounter: Secondary | ICD-10-CM | POA: Diagnosis present

## 2016-12-02 DIAGNOSIS — C187 Malignant neoplasm of sigmoid colon: Secondary | ICD-10-CM | POA: Diagnosis present

## 2016-12-02 DIAGNOSIS — K861 Other chronic pancreatitis: Secondary | ICD-10-CM | POA: Diagnosis present

## 2016-12-02 DIAGNOSIS — D62 Acute posthemorrhagic anemia: Secondary | ICD-10-CM

## 2016-12-02 DIAGNOSIS — K621 Rectal polyp: Secondary | ICD-10-CM | POA: Diagnosis not present

## 2016-12-02 DIAGNOSIS — K635 Polyp of colon: Secondary | ICD-10-CM

## 2016-12-02 DIAGNOSIS — K3189 Other diseases of stomach and duodenum: Secondary | ICD-10-CM | POA: Diagnosis present

## 2016-12-02 DIAGNOSIS — E785 Hyperlipidemia, unspecified: Secondary | ICD-10-CM | POA: Diagnosis present

## 2016-12-02 DIAGNOSIS — Z794 Long term (current) use of insulin: Secondary | ICD-10-CM | POA: Diagnosis not present

## 2016-12-02 DIAGNOSIS — K219 Gastro-esophageal reflux disease without esophagitis: Secondary | ICD-10-CM | POA: Diagnosis present

## 2016-12-02 DIAGNOSIS — N400 Enlarged prostate without lower urinary tract symptoms: Secondary | ICD-10-CM | POA: Diagnosis present

## 2016-12-02 DIAGNOSIS — E876 Hypokalemia: Secondary | ICD-10-CM | POA: Diagnosis present

## 2016-12-02 DIAGNOSIS — Z79899 Other long term (current) drug therapy: Secondary | ICD-10-CM | POA: Diagnosis not present

## 2016-12-02 DIAGNOSIS — R579 Shock, unspecified: Secondary | ICD-10-CM | POA: Diagnosis present

## 2016-12-02 DIAGNOSIS — F17210 Nicotine dependence, cigarettes, uncomplicated: Secondary | ICD-10-CM | POA: Diagnosis present

## 2016-12-02 HISTORY — PX: ESOPHAGOGASTRODUODENOSCOPY (EGD) WITH PROPOFOL: SHX5813

## 2016-12-02 HISTORY — PX: COLONOSCOPY WITH PROPOFOL: SHX5780

## 2016-12-02 LAB — CBC WITH DIFFERENTIAL/PLATELET
Basophils Absolute: 0 10*3/uL (ref 0–0.1)
Basophils Relative: 0 %
EOS ABS: 0 10*3/uL (ref 0–0.7)
EOS PCT: 0 %
HCT: 32.5 % — ABNORMAL LOW (ref 40.0–52.0)
Hemoglobin: 10.9 g/dL — ABNORMAL LOW (ref 13.0–18.0)
LYMPHS ABS: 0.8 10*3/uL — AB (ref 1.0–3.6)
Lymphocytes Relative: 10 %
MCH: 33 pg (ref 26.0–34.0)
MCHC: 33.6 g/dL (ref 32.0–36.0)
MCV: 98.2 fL (ref 80.0–100.0)
Monocytes Absolute: 0.4 10*3/uL (ref 0.2–1.0)
Monocytes Relative: 5 %
Neutro Abs: 7.2 10*3/uL — ABNORMAL HIGH (ref 1.4–6.5)
Neutrophils Relative %: 85 %
PLATELETS: 142 10*3/uL — AB (ref 150–440)
RBC: 3.3 MIL/uL — AB (ref 4.40–5.90)
RDW: 14.7 % — ABNORMAL HIGH (ref 11.5–14.5)
WBC: 8.5 10*3/uL (ref 3.8–10.6)

## 2016-12-02 LAB — GLUCOSE, CAPILLARY
GLUCOSE-CAPILLARY: 138 mg/dL — AB (ref 65–99)
GLUCOSE-CAPILLARY: 64 mg/dL — AB (ref 65–99)
GLUCOSE-CAPILLARY: 84 mg/dL (ref 65–99)
GLUCOSE-CAPILLARY: 89 mg/dL (ref 65–99)
GLUCOSE-CAPILLARY: 169 mg/dL — AB (ref 65–99)
Glucose-Capillary: 190 mg/dL — ABNORMAL HIGH (ref 65–99)
Glucose-Capillary: 212 mg/dL — ABNORMAL HIGH (ref 65–99)

## 2016-12-02 LAB — POCT I-STAT 4, (NA,K, GLUC, HGB,HCT)
GLUCOSE: 85 mg/dL (ref 65–99)
HEMATOCRIT: 31 % — AB (ref 39.0–52.0)
Hemoglobin: 10.5 g/dL — ABNORMAL LOW (ref 13.0–17.0)
Potassium: 3.5 mmol/L (ref 3.5–5.1)
Sodium: 139 mmol/L (ref 135–145)

## 2016-12-02 LAB — BASIC METABOLIC PANEL
Anion gap: 6 (ref 5–15)
BUN: 8 mg/dL (ref 6–20)
CALCIUM: 7.3 mg/dL — AB (ref 8.9–10.3)
CO2: 17 mmol/L — ABNORMAL LOW (ref 22–32)
CREATININE: 0.71 mg/dL (ref 0.61–1.24)
Chloride: 116 mmol/L — ABNORMAL HIGH (ref 101–111)
GFR calc Af Amer: >60 mL/min (ref >60)
Glucose, Bld: 176 mg/dL — ABNORMAL HIGH (ref 65–99)
POTASSIUM: 3.1 mmol/L — AB (ref 3.5–5.1)
SODIUM: 139 mmol/L (ref 135–145)

## 2016-12-02 LAB — MRSA PCR SCREENING: MRSA by PCR: POSITIVE — AB

## 2016-12-02 LAB — HIV ANTIBODY (ROUTINE TESTING W REFLEX): HIV Screen 4th Generation wRfx: NONREACTIVE

## 2016-12-02 LAB — LACTIC ACID, PLASMA: LACTIC ACID, VENOUS: 4.6 mmol/L — AB (ref 0.5–1.9)

## 2016-12-02 LAB — PROCALCITONIN: Procalcitonin: 3.1 ng/mL

## 2016-12-02 LAB — AMMONIA: AMMONIA: 10 umol/L (ref 9–35)

## 2016-12-02 LAB — POTASSIUM: Potassium: 3.5 mmol/L (ref 3.5–5.1)

## 2016-12-02 SURGERY — ESOPHAGOGASTRODUODENOSCOPY (EGD) WITH PROPOFOL
Anesthesia: General

## 2016-12-02 MED ORDER — NOREPINEPHRINE BITARTRATE 1 MG/ML IV SOLN
0.0000 ug/min | INTRAVENOUS | Status: DC
  Filled 2016-12-02: qty 4

## 2016-12-02 MED ORDER — SODIUM CHLORIDE 0.9 % IV BOLUS (SEPSIS)
250.0000 mL | Freq: Once | INTRAVENOUS | Status: AC
  Administered 2016-12-02: 250 mL via INTRAVENOUS

## 2016-12-02 MED ORDER — SODIUM CHLORIDE 0.9 % IV SOLN
0.0000 ug/min | INTRAVENOUS | Status: DC
  Administered 2016-12-02: 20 ug/min via INTRAVENOUS
  Filled 2016-12-02: qty 1

## 2016-12-02 MED ORDER — SODIUM CHLORIDE 0.9 % IV BOLUS (SEPSIS)
500.0000 mL | Freq: Once | INTRAVENOUS | Status: AC
  Administered 2016-12-02: 500 mL via INTRAVENOUS

## 2016-12-02 MED ORDER — SODIUM CHLORIDE 0.9 % IV BOLUS (SEPSIS)
1000.0000 mL | Freq: Once | INTRAVENOUS | Status: AC
  Administered 2016-12-02: 1000 mL via INTRAVENOUS

## 2016-12-02 MED ORDER — SPOT INK MARKER SYRINGE KIT
PACK | SUBMUCOSAL | Status: DC | PRN
  Administered 2016-12-02: 5 mL via SUBMUCOSAL

## 2016-12-02 MED ORDER — PROPOFOL 500 MG/50ML IV EMUL
INTRAVENOUS | Status: DC | PRN
  Administered 2016-12-02: 75 ug/kg/min via INTRAVENOUS

## 2016-12-02 MED ORDER — CHLORHEXIDINE GLUCONATE CLOTH 2 % EX PADS
6.0000 | MEDICATED_PAD | Freq: Every day | CUTANEOUS | Status: DC
  Administered 2016-12-03 – 2016-12-04 (×2): 6 via TOPICAL

## 2016-12-02 MED ORDER — MIDAZOLAM HCL 2 MG/2ML IJ SOLN
INTRAMUSCULAR | Status: AC
  Filled 2016-12-02: qty 2

## 2016-12-02 MED ORDER — EPHEDRINE SULFATE 50 MG/ML IJ SOLN
INTRAMUSCULAR | Status: DC | PRN
  Administered 2016-12-02: 10 mg via INTRAVENOUS
  Administered 2016-12-02: 15 mg via INTRAVENOUS
  Administered 2016-12-02: 10 mg via INTRAVENOUS
  Administered 2016-12-02: 15 mg via INTRAVENOUS
  Administered 2016-12-02 (×2): 10 mg via INTRAVENOUS

## 2016-12-02 MED ORDER — DEXTROSE 50 % IV SOLN
25.0000 mL | Freq: Once | INTRAVENOUS | Status: AC
  Administered 2016-12-02: 25 mL via INTRAVENOUS
  Filled 2016-12-02: qty 50

## 2016-12-02 MED ORDER — MIDAZOLAM HCL 5 MG/5ML IJ SOLN
INTRAMUSCULAR | Status: DC | PRN
  Administered 2016-12-02: 2 mg via INTRAVENOUS

## 2016-12-02 MED ORDER — SODIUM CHLORIDE 0.9 % IV SOLN
INTRAVENOUS | Status: DC | PRN
  Administered 2016-12-02: 13:00:00 via INTRAVENOUS

## 2016-12-02 MED ORDER — FENTANYL CITRATE (PF) 100 MCG/2ML IJ SOLN
INTRAMUSCULAR | Status: DC | PRN
  Administered 2016-12-02 (×2): 50 ug via INTRAVENOUS

## 2016-12-02 MED ORDER — LACTATED RINGERS IV SOLN
INTRAVENOUS | Status: DC
  Administered 2016-12-02: 50 mL/h via INTRAVENOUS
  Administered 2016-12-03: 22:00:00 via INTRAVENOUS

## 2016-12-02 MED ORDER — EPHEDRINE SULFATE 50 MG/ML IJ SOLN
INTRAMUSCULAR | Status: AC
  Filled 2016-12-02: qty 1

## 2016-12-02 MED ORDER — FENTANYL CITRATE (PF) 100 MCG/2ML IJ SOLN
INTRAMUSCULAR | Status: AC
  Filled 2016-12-02: qty 2

## 2016-12-02 MED ORDER — MUPIROCIN 2 % EX OINT
1.0000 "application " | TOPICAL_OINTMENT | Freq: Two times a day (BID) | CUTANEOUS | Status: DC
  Administered 2016-12-02 – 2016-12-04 (×4): 1 via NASAL
  Filled 2016-12-02 (×3): qty 22

## 2016-12-02 MED ORDER — PROPOFOL 10 MG/ML IV BOLUS
INTRAVENOUS | Status: DC | PRN
Start: 2016-12-02 — End: 2016-12-02
  Administered 2016-12-02: 20 mg via INTRAVENOUS
  Administered 2016-12-02: 30 mg via INTRAVENOUS

## 2016-12-02 MED ORDER — PHENYLEPHRINE HCL 10 MG/ML IJ SOLN
INTRAMUSCULAR | Status: DC | PRN
  Administered 2016-12-02 (×3): 200 ug via INTRAVENOUS
  Administered 2016-12-02 (×2): 100 ug via INTRAVENOUS
  Administered 2016-12-02: 200 ug via INTRAVENOUS

## 2016-12-02 MED ORDER — LIDOCAINE HCL (PF) 2 % IJ SOLN
INTRAMUSCULAR | Status: DC | PRN
  Administered 2016-12-02: 50 mg

## 2016-12-02 NOTE — Op Note (Signed)
[facility] Gastroenterology Patient Name: [name] Procedure Date: 12/02/2016 1:07 PM MRN: [id number] Account #: [id number] Date of Birth: 31-Oct-1960 Admit Type: Inpatient Age: 56 Room: [facility] ENDO ROOM 4 Gender: Male Note Status: Finalized Procedure:            Upper GI endoscopy Indications:          Acute post hemorrhagic anemia Providers:            Lucilla Lame MD, MD Referring MD:         Remus Blake MD, MD (Referring MD) Medicines:            Propofol per Anesthesia Complications:        No immediate complications. Procedure:            Pre-Anesthesia Assessment:                       - Prior to the procedure, a History and Physical was                        performed, and patient medications and allergies were                        reviewed. The patient's tolerance of previous                        anesthesia was also reviewed. The risks and benefits of                        the procedure and the sedation options and risks were                        discussed with the patient. All questions were                        answered, and informed consent was obtained. Prior                        Anticoagulants: The patient has taken no previous                        anticoagulant or antiplatelet agents. ASA Grade                        Assessment: II - A patient with mild systemic disease.                        After reviewing the risks and benefits, the patient was                        deemed in satisfactory condition to undergo the                        procedure.                       After obtaining informed consent, the endoscope was                        passed under direct vision. Throughout the procedure,  the patient's blood pressure, pulse, and oxygen                        saturations were monitored continuously. The                        Colonoscope was introduced through the mouth, and   advanced to the second part of duodenum. The upper GI                        endoscopy was accomplished without difficulty. The                        patient tolerated the procedure well. Findings:      The examined esophagus was normal.      A large, sessile, non-circumferential mass with no bleeding and no       stigmata of recent bleeding was found in the cardia. Biopsies were taken       with a cold forceps for histology.      The examined duodenum was normal. Impression:           - Normal esophagus.                       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                       - Normal examined duodenum. Recommendation:       - Await pathology results.                       - Return patient to hospital ward for ongoing care. Procedure Code(s):    --- Professional ---                       [phone], Esophagogastroduodenoscopy, flexible, transoral;                        with biopsy, single or multiple Diagnosis Code(s):    --- Professional ---                       D49.0, Neoplasm of unspecified behavior of digestive                        system                       D62, Acute posthemorrhagic anemia CPT copyright 2016 American Medical Association. All rights reserved. The codes documented in this report are preliminary and upon coder review may  be revised to meet current compliance requirements. Lucilla Lame MD, MD 12/02/2016 1:21:21 PM This report has been signed electronically. Number of Addenda: 0 Note Initiated On: 12/02/2016 1:07 PM      [facility]

## 2016-12-02 NOTE — Progress Notes (Signed)
Patient back from ENDO.  BP very low.  Sainani paged.  Ordered 539ml bolus.  Recheck when bolus is finished.

## 2016-12-02 NOTE — Consult Note (Signed)
  Full surgical consult to follow tomorrow.  Patient currently hypotensive in ICU after sedation for endoscopies. 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   Extensive substance abuse history and prior history of pancreatic surgery at [facility] secondary to chronic pancreatitis.  Although there is no obvious evidence of metastatic cancer on admission CT this has the potential of being a very difficult oncologic problem if he truly has synchronous cancers of the foregut and hindgut.  Current plan would be to await pathology results prior to any surgical planning. Would also discuss whether or not he would be better served in a university setting for his cancer care given his comorbid conditions.  General Surgery will continue to follow.  Clayburn Pert, MD [facility]  Day [facility] [phone] Night [facility] [phone]

## 2016-12-02 NOTE — Progress Notes (Signed)
Verbal order from Dr Verdell Carmine to administer anther 534ml bolus.

## 2016-12-02 NOTE — Progress Notes (Signed)
BP 76/55 after 567ml bolus. MD notifed.

## 2016-12-02 NOTE — Anesthesia Preprocedure Evaluation (Signed)
Anesthesia Evaluation  Patient identified by MRN, date of birth, ID band Patient awake    Reviewed: Allergy & Precautions, NPO status , Patient's Chart, lab work & pertinent test results  History of Anesthesia Complications Negative for: history of anesthetic complications  Airway Mallampati: III       Dental  (+) Missing, Chipped, Poor Dentition   Pulmonary Current Smoker,           Cardiovascular hypertension, Pt. on medications      Neuro/Psych negative neurological ROS     GI/Hepatic GERD  Medicated,(+)     substance abuse  alcohol use,   Endo/Other  diabetes, Insulin Dependent, Oral Hypoglycemic Agents  Renal/GU      Musculoskeletal   Abdominal   Peds  Hematology  (+) anemia ,   Anesthesia Other Findings   Reproductive/Obstetrics                             Anesthesia Physical Anesthesia Plan  ASA: III  Anesthesia Plan: General   Post-op Pain Management:    Induction: Intravenous  PONV Risk Score and Plan: 1 and Ondansetron and Treatment may vary due to age  Airway Management Planned:   Additional Equipment:   Intra-op Plan:   Post-operative Plan:   Informed Consent: I have reviewed the patients History and Physical, chart, labs and discussed the procedure including the risks, benefits and alternatives for the proposed anesthesia with the patient or authorized representative who has indicated his/her understanding and acceptance.     Plan Discussed with:   Anesthesia Plan Comments:         Anesthesia Quick Evaluation

## 2016-12-02 NOTE — Progress Notes (Signed)
A&O. Some anxiety at the beginning of shift. Ativan given. Benadryl given for itching. SLept the rest of shift. EGD and colonoscopy for today. Wife at side.

## 2016-12-02 NOTE — Transfer of Care (Signed)
Immediate Anesthesia Transfer of Care Note  Patient: [name]  Procedure(s) Performed: Procedure(s): ESOPHAGOGASTRODUODENOSCOPY (EGD) WITH PROPOFOL (N/A) COLONOSCOPY WITH PROPOFOL (N/A)  Patient Location: PACU  Anesthesia Type:General  Level of Consciousness: sedated  Airway & Oxygen Therapy: Patient Spontanous Breathing and Patient connected to nasal cannula oxygen  Post-op Assessment: Report given to RN and Post -op Vital signs reviewed and stable  Post vital signs: Reviewed and stable  Last Vitals:  Vitals:   12/02/16 1237 12/02/16 1403  BP: 94/66 (!) 80/55  Pulse: (!) 57 72  Resp: 18 10  Temp:  (!) 36 C    Last Pain:  Vitals:   12/02/16 1403  TempSrc: Tympanic  PainSc:          Complications: No apparent anesthesia complications

## 2016-12-02 NOTE — Progress Notes (Signed)
Name: [name] MRN: [id number] DOB: [birth year]    ADMISSION DATE:  11/30/2016 CONSULTATION DATE: 12/02/16  CHIEF COMPLAINT: Hypotension  BRIEF PATIENT DESCRIPTION: 56 year old male with tumor of the gastric fundus underwent  Endoscopy and colonoscopy.  Post procedure patient was hypotensive and was started on low dose pressors.  SIGNIFICANT EVENTS  12/02/16 Patient was sent to the ICU  Post procedure s/p endoscopy and colonoscopy.  Patient was hypotensive therefore was started on low dose pressors.  STUDIES:  11/30/16 CT Abdomen Pelvis>>Hepatobiliary: Hyperenhancing lesion is again seen in the posterioraspect of the right lobe consistent with hemangioma. Diffuse pancreatic calcifications are noted consistentwith chronic pancreatitis.  11/30/16 CT head WO Contrast>>Chronic stable small vessel ischemic disease of periventricular white matte   HISTORY OF PRESENT ILLNESS:  [name] is a 56 year old male with known history of BPH,DM, Pancreatitis and GERD.  Patient was complaining of diaarhea for over a year.  Patient was seen at [facility] clinic and was set up for a colonoscopy. Patient underwent upper GI endoscopy and colonoscopy which was concerning for neoplasm of digestive system and transverse colon.  Post procedure patient became hypotensive  Therefor was sent to the ICU for further management.  PAST MEDICAL HISTORY :   has a past medical history of Anemia; BPH without obstruction/lower urinary tract symptoms; Cellulitis of scrotum; Cyst of prostate (05/13/2015); Diabetes ([place]); Difficulty urinating; Epididymoorchitis; ETOH abuse; Fracture of right tibia and fibula; GERD (gastroesophageal reflux disease); GI bleed; Pancreatitis; and Trichomoniasis.  has a past surgical history that includes resection of pancreas; Scrotal exploration; Fracture surgery; and Fracture surgery. Prior to Admission medications   Medication Sig Start Date End Date Taking? Authorizing Provider  CREON 36000 units  CPEP capsule Take 36,000 Units by mouth 5 (five) times daily as needed. Take 36,000 Units by mouth 3 times daily with meals and twice daily with snacks. 10/10/16  Yes [provider]  fluticasone (FLONASE) 50 MCG/ACT nasal spray Place 2 sprays into both nostrils daily. 10/08/16  Yes [provider]  LANTUS SOLOSTAR 100 UNIT/ML Solostar Pen Inject 10 Units into the skin 2 (two) times daily. 07/02/16  Yes Harvest Dark, MD  loperamide (IMODIUM A-D) 2 MG tablet Take 1 tablet (2 mg total) by mouth 4 (four) times daily as needed for diarrhea or loose stools. 07/02/16  Yes Harvest Dark, MD  traZODone (DESYREL) 50 MG tablet Take 50 mg by mouth at bedtime. 10/08/16  Yes [provider]  B-D UF III MINI PEN NEEDLES 31G X 5 MM MISC  10/17/16   [provider]  ciprofloxacin (CIPRO) 500 MG tablet Take 1 tablet (500 mg total) by mouth 2 (two) times daily. Patient not taking: Reported on 12/02/2016 08/17/16   Paulette Blanch, MD  lisinopril (PRINIVIL,ZESTRIL) 10 MG tablet Take 10 mg by mouth daily.    [provider]  NOVOLOG FLEXPEN 100 UNIT/ML FlexPen Inject 48 Units into the skin at bedtime.  05/16/15   [provider]  Oxycodone HCl 10 MG TABS Take by mouth. 02/10/15   [provider]  pantoprazole (PROTONIX) 40 MG tablet Take by mouth. 04/30/16   [provider]  pravastatin (PRAVACHOL) 40 MG tablet take 1 tablet by mouth every evening for cholesterol 06/18/15   [provider]  pregabalin (LYRICA) 50 MG capsule Take 50 mg by mouth. 04/17/16   [provider]   Allergies  Allergen Reactions  . Aspirin Itching and Other (See Comments)    Reaction:  abdominal pain    FAMILY HISTORY:  family history includes Cancer in his father. SOCIAL HISTORY:  reports that he has been smoking.  He has been smoking about 0.00 packs per day. He has never used smokeless tobacco. He reports that he drinks alcohol. He reports that he does  not use drugs.  REVIEW OF SYSTEMS:   Constitutional: Negative for fever, chills, weight loss, malaise/fatigue and diaphoresis.  HENT: Negative for hearing loss, ear pain, nosebleeds, congestion, sore throat, neck pain, tinnitus and ear discharge.   Eyes: Negative for blurred vision, double vision, photophobia, pain, discharge and redness.  Respiratory: Negative for cough, hemoptysis, sputum production, shortness of breath, wheezing and stridor.   Cardiovascular: Negative for chest pain, palpitations, orthopnea, claudication, leg swelling and PND.  Gastrointestinal: Negative for heartburn, nausea, vomiting, abdominal pain, diarrhea, constipation, blood in stool and melena.  Genitourinary: Negative for dysuria, urgency, frequency, hematuria and flank pain.  Musculoskeletal: Negative for myalgias, back pain, joint pain and falls.  Skin: Negative for itching and rash.  Neurological: Negative for dizziness, tingling, tremors, sensory change, speech change, focal weakness, seizures, loss of consciousness, weakness and headaches.  Endo/Heme/Allergies: Negative for environmental allergies and polydipsia. Does not bruise/bleed easily.  SUBJECTIVE:  Patient states that "he is hungry and would like to eat."  VITAL SIGNS: Temp:  [89.2 F (31.8 C)-97.5 F (36.4 C)] 89.4 F (31.9 C) (06/05 1930) Pulse Rate:  [56-73] 59 (06/05 1930) Resp:  [9-19] 17 (06/05 1930) BP: (66-136)/(44-86) 136/86 (06/05 1930) SpO2:  [97 %-100 %] 100 % (06/05 1930)  PHYSICAL EXAMINATION: General:  Middle aged [place] male, in no acute distress Neuro:  Awake, Alert, oriented HEENT:  AT,[place], No jvd Cardiovascular:  S1s2, regular, no m/r/g noted Lungs: Clear bilaterally, no wheezes,crackles, rhonchi noted Abdomen:  Soft, NT,ND Musculoskeletal:  No edema, cyanosis  Skin:  Cool,dry and intact   Recent Labs Lab 11/30/16 2033 12/01/16 0927 12/02/16 1037 12/02/16 1306  NA 130* 136  --  139  K 3.5 2.9* 3.5 3.5  CL 100* 110   --   --   CO2 20* 22  --   --   BUN 16 13  --   --   CREATININE 0.99 0.71  --   --   GLUCOSE 422* 197*  --  85    Recent Labs Lab 11/30/16 2033 12/01/16 0927 12/02/16 1306  HGB 11.0* 9.3* 10.5*  HCT 32.0* 27.9* 31.0*  WBC 6.8 5.0  --   PLT 183 142*  --    Dg Chest 2 View  Result Date: 11/30/2016 CLINICAL DATA:  Weakness EXAM: CHEST  2 VIEW COMPARISON:  08/16/2016 FINDINGS: Cardiac shadow is within normal limits. The lungs are well aerated bilaterally. Old rib fractures are seen bilaterally with healing. No acute abnormality is noted. IMPRESSION: No acute abnormality noted. Electronically Signed   By: Inez Catalina M.D.   On: 11/30/2016 23:03   Ct Head Wo Contrast  Result Date: 11/30/2016 CLINICAL DATA:  Recurrent syncope, hypotension and weakness for several days. Nausea and emesis today. EXAM: CT HEAD WITHOUT CONTRAST TECHNIQUE: Contiguous axial images were obtained from the base of the skull through the vertex without intravenous contrast. COMPARISON:  08/16/2016 head CT FINDINGS: Brain: Chronic stable small vessel ischemic changes of periventricular white matter. No large vascular territory infarction. Idiopathic basal ganglial calcifications are noted bilaterally left greater than right. No acute intracranial hemorrhage, midline shift or edema. No extra-axial fluid collections. No hydrocephalus. Basal cisterns and fourth ventricle are midline.  Vascular: No hyperdense vessels or unexpected calcifications. Skull: No primary bone lesion.  No skull fracture. Sinuses/Orbits: Intact orbits and globes. No acute abnormality of the visualized paranasal sinuses and mastoids. Other: None IMPRESSION: Chronic stable small vessel ischemic disease of periventricular white matter. No acute intracranial abnormality. Electronically Signed   By: Ashley Royalty M.D.   On: 11/30/2016 22:51   Ct Abdomen Pelvis W Contrast  Result Date: 11/30/2016 CLINICAL DATA:  Chronic diarrhea and bloody stools EXAM: CT ABDOMEN  AND PELVIS WITH CONTRAST TECHNIQUE: Multidetector CT imaging of the abdomen and pelvis was performed using the standard protocol following bolus administration of intravenous contrast. CONTRAST:  63mL ISOVUE-300 IOPAMIDOL (ISOVUE-300) INJECTION 61% COMPARISON:  04/04/2016 FINDINGS: Lower chest: Lung bases are free of acute infiltrate or sizable effusion. Hepatobiliary: Hyperenhancing lesion is again seen in the posterior aspect of the right lobe consistent with hemangioma. This is stable from the prior exam. No gallstones, gallbladder wall thickening, or biliary dilatation. Pancreas: Diffuse pancreatic calcifications are noted consistent with chronic pancreatitis. No acute inflammatory changes are seen. Spleen: Normal in size without focal abnormality. Adrenals/Urinary Tract: The adrenal glands are within normal limits. Kidneys demonstrate scattered hypodensities likely related to cysts. No renal calculi or obstructive changes are seen. The bladder is well distended. Stomach/Bowel: Stomach is within normal limits. Appendix appears normal. No evidence of bowel wall thickening, distention, or inflammatory changes. Postsurgical changes are noted in the small bowel in the left mid abdomen. Vascular/Lymphatic: Aortic atherosclerosis. Few small gastrohepatic ligament lymph nodes are noted. The largest of these measures 11 mm in short axis best seen on image number 22 of series 2. This is slightly larger than that seen on the prior exam. Reproductive: Prostate again demonstrates a 2.1 cm cyst similar to that seen on the prior exam. Other: No abdominal wall hernia or abnormality. No abdominopelvic ascites. Musculoskeletal: Healed left rib fracture is noted. Degenerative changes of the lumbar spine are noted. IMPRESSION: Stable chronic changes as described above. Small gastrohepatic ligament lymph nodes slightly more prominent than that seen on the prior exam likely of a reactive nature. Electronically Signed   By: Inez Catalina M.D.   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

## 2016-12-02 NOTE — Progress Notes (Signed)
Initial Nutrition Assessment  DOCUMENTATION CODES:   Not applicable  INTERVENTION:  Provide ONS as desired w/ diet advancement, will follow-up to see what patient may want. Recommend continue banana bag  NUTRITION DIAGNOSIS:   Inadequate oral intake related to chronic illness, other (see comment) (Chronic diarrhea) as evidenced by per patient/family report.  GOAL:   Patient will meet greater than or equal to 90% of their needs  MONITOR:   I & O's, Labs, Weight trends, Diet advancement  REASON FOR ASSESSMENT:   Malnutrition Screening Tool    ASSESSMENT:  56 yo male with history of chronic diarrhea, ETOH abuse,presents with recurrent syncopal episodes He was lethargic, able to provide some history this morning but ultimately feel back asleep during my visit. He was unsure ofh is usual weight, appears he exhibits a 29#/19.3% severe wt loss over 8 months per chart. Regarding PO intake, states "I eat what I want, some big meals, some small." No apparent acute complaints at this time. Unable to complete Nutrition-Focused physical exam at this time.  EGD & colonscopy today - found a mass in the cardia, and rectum Labs and medications reviewed: Banana Bag  Diet Order:  Diet regular Room service appropriate? Yes; Fluid consistency: Thin  Skin:  Reviewed, no issues  Last BM:  12/01/2016  Height:   Ht Readings from Last 1 Encounters:  12/01/16 5\' 8"  (1.727 m)    Weight:   Wt Readings from Last 1 Encounters:  12/01/16 121 lb 11.2 oz (55.2 kg)    Ideal Body Weight:  70 kg  BMI:  Body mass index is 18.5 kg/m.  Estimated Nutritional Needs:   Kcal:  1650-1925 calories  Protein:  66-83 grams  Fluid:  >/= 1.7L  EDUCATION NEEDS:   Education needs no appropriate at this time  [name]. [name], MS, RD LDN Inpatient Clinical Dietitian Pager [phone]

## 2016-12-02 NOTE — Progress Notes (Signed)
Spoke with Clarise Cruz, RN from [facility] and made her aware that patient hasn't voided during entire shift to this RN's knowledge and that bladder scan result is 591 mL at present.

## 2016-12-02 NOTE — Progress Notes (Signed)
Notified Dr. Marcille Blanco of BP of 84/63. Fluid bolus ordered.

## 2016-12-02 NOTE — Progress Notes (Signed)
Bair hugger applied for rectal temperature of 31.8 celcius.

## 2016-12-02 NOTE — Progress Notes (Signed)
[facility] [place] NAME: [name]    MR#:  [id number]  DATE OF BIRTH:  1961/03/11  SUBJECTIVE:   Patient here due to chronic diarrhea, weakness and also noted to have a syncopal episode. Status post endoscopy and colonoscopy today. Discussed with gastroenterology and endoscopy showing a possible gastric fundus tumor which was biopsied. Colonoscopy also showed multiple polyps which were removed and biopsied. Patient also noted to have a sigmoid mass which is thought to be malignant and it was biopsied.  REVIEW OF SYSTEMS:    Review of Systems  Constitutional: Negative for chills and fever.  HENT: Negative for congestion and tinnitus.   Eyes: Negative for blurred vision and double vision.  Respiratory: Negative for cough, shortness of breath and wheezing.   Cardiovascular: Negative for chest pain, orthopnea and PND.  Gastrointestinal: Positive for diarrhea. Negative for abdominal pain, nausea and vomiting.  Genitourinary: Negative for dysuria and hematuria.  Neurological: Negative for dizziness, sensory change and focal weakness.  All other systems reviewed and are negative.   Nutrition: Clear Liquids Tolerating Diet: Yes Tolerating PT: Await Eval.     DRUG ALLERGIES:   Allergies  Allergen Reactions  . Aspirin Itching and Other (See Comments)    Reaction: abdominal pain    VITALS:  Blood pressure (!) 81/57, pulse 73, temperature (!) 96.8 F (36 C), temperature source Tympanic, resp. rate (!) 9, height 5\' 8"  (1.727 m), weight 55.2 kg (121 lb 11.2 oz), SpO2 99 %.  PHYSICAL EXAMINATION:   Physical Exam  GENERAL:  56 y.o.-year-old thin patient lying in bed in no acute distress.  EYES: Pupils equal, round, reactive to light and accommodation. No scleral icterus. Extraocular muscles intact.  HEENT: Head atraumatic, normocephalic. Oropharynx and nasopharynx clear.  NECK:  Supple, no jugular venous distention. No thyroid  enlargement, no tenderness.  LUNGS: Normal breath sounds bilaterally, no wheezing, rales, rhonchi. No use of accessory muscles of respiration.  CARDIOVASCULAR: S1, S2 normal. No murmurs, rubs, or gallops.  ABDOMEN: Soft, nontender, nondistended. Bowel sounds present. No organomegaly or mass.  EXTREMITIES: No cyanosis, clubbing or edema b/l.    NEUROLOGIC: Cranial nerves II through XII are intact. No focal Motor or sensory deficits b/l. Globally weak.   PSYCHIATRIC: The patient is alert and oriented x 3.  SKIN: No obvious rash, lesion, or ulcer.    LABORATORY PANEL:   CBC  Recent Labs Lab 12/01/16 0927  WBC 5.0  HGB 9.3*  HCT 27.9*  PLT 142*   ------------------------------------------------------------------------------------------------------------------  Chemistries   Recent Labs Lab 11/30/16 2033 12/01/16 0927 12/02/16 1037  NA 130* 136  --   K 3.5 2.9* 3.5  CL 100* 110  --   CO2 20* 22  --   GLUCOSE 422* 197*  --   BUN 16 13  --   CREATININE 0.99 0.71  --   CALCIUM 8.1* 7.3*  --   AST 74*  --   --   ALT 41  --   --   ALKPHOS 165*  --   --   BILITOT 0.7  --   --    ------------------------------------------------------------------------------------------------------------------  Cardiac Enzymes  Recent Labs Lab 12/01/16 1449  TROPONINI <0.03   ------------------------------------------------------------------------------------------------------------------  RADIOLOGY:  Dg Chest 2 View  Result Date: 11/30/2016 CLINICAL DATA:  Weakness EXAM: CHEST  2 VIEW COMPARISON:  08/16/2016 FINDINGS: Cardiac shadow is within normal limits. The lungs are well aerated bilaterally. Old rib fractures are seen bilaterally with  healing. No acute abnormality is noted. IMPRESSION: No acute abnormality noted. Electronically Signed   By: Inez Catalina M.D.   On: 11/30/2016 23:03   Ct Head Wo Contrast  Result Date: 11/30/2016 CLINICAL DATA:  Recurrent syncope, hypotension and  weakness for several days. Nausea and emesis today. EXAM: CT HEAD WITHOUT CONTRAST TECHNIQUE: Contiguous axial images were obtained from the base of the skull through the vertex without intravenous contrast. COMPARISON:  08/16/2016 head CT FINDINGS: Brain: Chronic stable small vessel ischemic changes of periventricular white matter. No large vascular territory infarction. Idiopathic basal ganglial calcifications are noted bilaterally left greater than right. No acute intracranial hemorrhage, midline shift or edema. No extra-axial fluid collections. No hydrocephalus. Basal cisterns and fourth ventricle are midline. Vascular: No hyperdense vessels or unexpected calcifications. Skull: No primary bone lesion.  No skull fracture. Sinuses/Orbits: Intact orbits and globes. No acute abnormality of the visualized paranasal sinuses and mastoids. Other: None IMPRESSION: Chronic stable small vessel ischemic disease of periventricular white matter. No acute intracranial abnormality. Electronically Signed   By: Ashley Royalty M.D.   On: 11/30/2016 22:51   Ct Abdomen Pelvis W Contrast  Result Date: 11/30/2016 CLINICAL DATA:  Chronic diarrhea and bloody stools EXAM: CT ABDOMEN AND PELVIS WITH CONTRAST TECHNIQUE: Multidetector CT imaging of the abdomen and pelvis was performed using the standard protocol following bolus administration of intravenous contrast. CONTRAST:  21mL ISOVUE-300 IOPAMIDOL (ISOVUE-300) INJECTION 61% COMPARISON:  04/04/2016 FINDINGS: Lower chest: Lung bases are free of acute infiltrate or sizable effusion. Hepatobiliary: Hyperenhancing lesion is again seen in the posterior aspect of the right lobe consistent with hemangioma. This is stable from the prior exam. No gallstones, gallbladder wall thickening, or biliary dilatation. Pancreas: Diffuse pancreatic calcifications are noted consistent with chronic pancreatitis. No acute inflammatory changes are seen. Spleen: Normal in size without focal abnormality.  Adrenals/Urinary Tract: The adrenal glands are within normal limits. Kidneys demonstrate scattered hypodensities likely related to cysts. No renal calculi or obstructive changes are seen. The bladder is well distended. Stomach/Bowel: Stomach is within normal limits. Appendix appears normal. No evidence of bowel wall thickening, distention, or inflammatory changes. Postsurgical changes are noted in the small bowel in the left mid abdomen. Vascular/Lymphatic: Aortic atherosclerosis. Few small gastrohepatic ligament lymph nodes are noted. The largest of these measures 11 mm in short axis best seen on image number 22 of series 2. This is slightly larger than that seen on the prior exam. Reproductive: Prostate again demonstrates a 2.1 cm cyst similar to that seen on the prior exam. Other: No abdominal wall hernia or abnormality. No abdominopelvic ascites. Musculoskeletal: Healed left rib fracture is noted. Degenerative changes of the lumbar spine are noted. IMPRESSION: Stable chronic changes as described above. Small gastrohepatic ligament lymph nodes slightly more prominent than that seen on the prior exam likely of a reactive nature. Electronically Signed   By: Inez Catalina M.D.   On: 11/30/2016 23:00     ASSESSMENT AND PLAN:   56 year old male with past medical history of chronic pancreatitis, alcohol abuse, diabetes, BPH, GERD, previous history of GI bleed who presents to the hospital due to a syncopal episode and also ongoing diarrhea.  1. Syncope- suspected to be due to Orthostatic hypotension. No further episodes.  - CT head (-).  CE X 3 have been (-).  - Continue IV fluids, recheck orthostatic vital signs today.  2. Diarrhea-chronic for the patient as he had it for years. Questionable related to chronic pancreatitis. -Seems like a secretory  diarrhea as patient has symptoms despite eating. Seen by gastroenterology and status post endoscopy and colonoscopy today. Endoscopy showing possible gastric mass  but no obstruction biopsy results are pending. -Colonoscopy yesterday showing multiple polyps which were removed. Patient also had a noted mass which is concerning for malignancy which was biopsied. I will get a surgical consult. Continue supportive care for now.  3. Sigmoid/Gastric Mass - noted to endoscopy/colonoscopy.  - will get Surgical consult.  No evidence of obstruction.   4. History of alcohol abuse-continue CIWA protocol. - cont. Folate, Thiamine.   5. Hypokalemia - improved w/ supplementation.   6. Hyperlipidemia-continue Pravachol.  7. Neuropathy-continue Lyrica, Neurontin.  8. GERD-continue Protonix.  Discussed with GI (Dr. Allen Norris) and also with pt's girlfriend over the phone.   All the records are reviewed and case discussed with Care Management/Social Worker. Management plans discussed with the patient, family and they are in agreement.  CODE STATUS: Full Code  DVT Prophylaxis: Lovenox  TOTAL TIME TAKING CARE OF THIS PATIENT: 30 minutes.   POSSIBLE D/C IN 2-3 DAYS, DEPENDING ON CLINICAL CONDITION.   Henreitta Leber M.D on 12/02/2016 at 2:38 PM  Between 7am to 6pm - Pager - [phone]  After 6pm go to [email or web address] - Proofreader  [facility]  Office  [phone]  CC: Primary care physician; [name], MD

## 2016-12-02 NOTE — Progress Notes (Signed)
Hypoglycemic Event  CBG: 64  Treatment: 47ml of D50  Symptoms: lethargic but arousable   Follow-up CBG: Time:0830 CBG Result:136  Possible Reasons for Event: NPO, golightly prep for colonoscopy  Comments/MD notified:patient resting with family at bedside     [name] Dear

## 2016-12-02 NOTE — Progress Notes (Signed)
Spoke to Dr Verdell Carmine, will transfer pt to stepdown.

## 2016-12-02 NOTE — Anesthesia Postprocedure Evaluation (Signed)
Anesthesia Post Note  Patient: [name]  Procedure(s) Performed: Procedure(s) (LRB): ESOPHAGOGASTRODUODENOSCOPY (EGD) WITH PROPOFOL (N/A) COLONOSCOPY WITH PROPOFOL (N/A)  Patient location during evaluation: Endoscopy Anesthesia Type: General Level of consciousness: awake and alert Pain management: pain level controlled Vital Signs Assessment: post-procedure vital signs reviewed and stable Respiratory status: spontaneous breathing and respiratory function stable Cardiovascular status: stable Anesthetic complications: no     Last Vitals:  Vitals:   12/02/16 1237 12/02/16 1403  BP: 94/66 (!) 80/55  Pulse: (!) 57 72  Resp: 18 10  Temp:  (!) 36 C    Last Pain:  Vitals:   12/02/16 1403  TempSrc: Tympanic  PainSc:                  KEPHART,WILLIAM K

## 2016-12-02 NOTE — Progress Notes (Signed)
Patient alert to self, drowsy.  Sinus brady per cardiac monitor.  Blood pressure has stabilized on neo drip.  Report given to Sharyn Lull, RN who is now taking over patient's care.

## 2016-12-02 NOTE — Progress Notes (Signed)
eLink Physician-Brief Progress Note Patient Name: [name] DOB: 1960/08/27 MRN: [id number]   Date of Service  12/02/2016  HPI/Events of Note  56 yo male admitted to ICU with persistent hypotension s/p colonoscopy. BP = 74/59 with MAP = 67.  LVEF = 50-55%.  eICU Interventions  Will order: 1. Bolus with 0.9 NaCl 1 liter IV over 1 hour now.  2. Phenylephrine IV infusion. Titrate to MAP > 65.      Intervention Category Major Interventions: Hypotension - evaluation and management Evaluation Type: New Patient Evaluation  Sommer,Steven Eugene 12/02/2016, 6:12 PM

## 2016-12-02 NOTE — Progress Notes (Signed)
Bincy, NP present and RN made her aware that patient is hypothermic with rectal temperature 31.8 celius on bair hugger.  RN asked NP about ordering labs including lactic acid.  NP stated that she would order some lab work.

## 2016-12-02 NOTE — Anesthesia Post-op Follow-up Note (Cosign Needed)
Anesthesia QCDR form completed.        

## 2016-12-02 NOTE — Brief Op Note (Signed)
AC polyp hot snare, and DC polyp cold snare not retrieved.

## 2016-12-02 NOTE — Op Note (Signed)
[facility] Gastroenterology Patient Name: [name] Procedure Date: 12/02/2016 1:07 PM MRN: [id number] Account #: [id number] Date of Birth: Feb 01, 1961 Admit Type: Inpatient Age: 56 Room: [facility] ENDO ROOM 4 Gender: Male Note Status: Finalized Procedure:            Colonoscopy Indications:          Chronic diarrhea, Acute post hemorrhagic anemia Providers:            Lucilla Lame MD, MD Referring MD:         Remus Blake MD, MD (Referring MD) Medicines:            Propofol per Anesthesia Complications:        No immediate complications. Procedure:            Pre-Anesthesia Assessment:                       - Prior to the procedure, a History and Physical was                        performed, and patient medications and allergies were                        reviewed. The patient's tolerance of previous                        anesthesia was also reviewed. The risks and benefits of                        the procedure and the sedation options and risks were                        discussed with the patient. All questions were                        answered, and informed consent was obtained. Prior                        Anticoagulants: The patient has taken no previous                        anticoagulant or antiplatelet agents. ASA Grade                        Assessment: III - A patient with severe systemic                        disease. After reviewing the risks and benefits, the                        patient was deemed in satisfactory condition to undergo                        the procedure.                       After obtaining informed consent, the colonoscope was                        passed under direct vision. Throughout the procedure,  the patient's blood pressure, pulse, and oxygen                        saturations were monitored continuously. The                        Colonoscope was introduced through the anus and                advanced to the the cecum, identified by appendiceal                        orifice and ileocecal valve. The colonoscopy was                        performed without difficulty. The patient tolerated the                        procedure well. The quality of the bowel preparation                        was poor. Findings:      The perianal and digital rectal examinations were normal.      Three pedunculated polyps were found in the ascending colon. The polyps       were 7 to 9 mm in size. These polyps were removed with a hot snare.       Resection and retrieval were complete.      Two pedunculated polyps were found in the transverse colon. The polyps       were 4 to 6 mm in size. These polyps were removed with a hot snare.       Resection and retrieval were complete.      A non-obstructing large mass was found in the sigmoid colon. The mass       was non-circumferential. No bleeding was present. This was biopsied with       a cold forceps for histology. Area was tattooed with an injection of 5       mL of [place] ink.      A 15 mm polyp was found in the rectum. The polyp was sessile. The polyp       was removed with a hot snare. Resection and retrieval were complete. Impression:           - Preparation of the colon was poor.                       - Three 7 to 9 mm polyps in the ascending colon,                        removed with a hot snare. Resected and retrieved.                       - Two 4 to 6 mm polyps in the transverse colon, removed                        with a hot snare. Resected and retrieved.                       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  Biopsied. Tattooed.                       - One 15 mm polyp in the rectum, removed with a hot                        snare. Resected and retrieved. Recommendation:       - Return patient to hospital ward for ongoing care. Procedure Code(s):    --- Professional ---                        [phone], Colonoscopy, flexible; with removal of tumor(s),                        polyp(s), or other lesion(s) by snare technique                       45381, Colonoscopy, flexible; with directed submucosal                        injection(s), any substance                       34917, 84, Colonoscopy, flexible; with biopsy, single                        or multiple Diagnosis Code(s):    --- Professional ---                       D12.2, Benign neoplasm of ascending colon                       K52.9, Noninfective gastroenteritis and colitis,                        unspecified                       D62, Acute posthemorrhagic anemia                       D12.3, Benign neoplasm of transverse colon (hepatic                        flexure or splenic flexure)                       D49.0, Neoplasm of unspecified behavior of digestive                        system                       K62.1, Rectal polyp CPT copyright 2016 American Medical Association. All rights reserved. The codes documented in this report are preliminary and upon coder review may  be revised to meet current compliance requirements. Lucilla Lame MD, MD 12/02/2016 1:56:50 PM This report has been signed electronically. Number of Addenda: 0 Note Initiated On: 12/02/2016 1:07 PM Scope Withdrawal Time: 0 hours 25 minutes 26 seconds  Total Procedure Duration: 0 hours 30 minutes 28 seconds       [facility]

## 2016-12-02 NOTE — Progress Notes (Signed)
[place] Progress Note Patient Name: [name] DOB: Oct 26, 1960 MRN: [id number]   Date of Service  12/02/2016  HPI/Events of Note  Oliguria - Bladder residual > 550 mL.   eICU Interventions  Will order: 1. Place Foley catheter.      Intervention Category Intermediate Interventions: Oliguria - evaluation and management  Sommer,Steven Eugene 12/02/2016, 6:43 PM

## 2016-12-03 ENCOUNTER — Encounter: Payer: Self-pay | Admitting: Gastroenterology

## 2016-12-03 LAB — CBC
HEMATOCRIT: 27 % — AB (ref 40.0–52.0)
HEMOGLOBIN: 8.9 g/dL — AB (ref 13.0–18.0)
MCH: 32.3 pg (ref 26.0–34.0)
MCHC: 32.9 g/dL (ref 32.0–36.0)
MCV: 98.3 fL (ref 80.0–100.0)
Platelets: 122 10*3/uL — ABNORMAL LOW (ref 150–440)
RBC: 2.75 MIL/uL — AB (ref 4.40–5.90)
RDW: 14.8 % — ABNORMAL HIGH (ref 11.5–14.5)
WBC: 5.9 10*3/uL (ref 3.8–10.6)

## 2016-12-03 LAB — GLUCOSE, CAPILLARY
GLUCOSE-CAPILLARY: 282 mg/dL — AB (ref 65–99)
Glucose-Capillary: 185 mg/dL — ABNORMAL HIGH (ref 65–99)
Glucose-Capillary: 125 mg/dL — ABNORMAL HIGH (ref 65–99)
Glucose-Capillary: 135 mg/dL — ABNORMAL HIGH (ref 65–99)

## 2016-12-03 LAB — POTASSIUM: Potassium: 3.5 mmol/L (ref 3.5–5.1)

## 2016-12-03 LAB — MAGNESIUM: MAGNESIUM: 1.4 mg/dL — AB (ref 1.7–2.4)

## 2016-12-03 LAB — PROCALCITONIN: Procalcitonin: 3.05 ng/mL

## 2016-12-03 MED ORDER — MAGNESIUM SULFATE 4 GM/100ML IV SOLN
4.0000 g | Freq: Once | INTRAVENOUS | Status: AC
  Administered 2016-12-03: 4 g via INTRAVENOUS
  Filled 2016-12-03: qty 100

## 2016-12-03 MED ORDER — POTASSIUM CHLORIDE CRYS ER 20 MEQ PO TBCR
40.0000 meq | EXTENDED_RELEASE_TABLET | Freq: Once | ORAL | Status: AC
  Administered 2016-12-03: 40 meq via ORAL
  Filled 2016-12-03: qty 2

## 2016-12-03 MED ORDER — INSULIN GLARGINE 100 UNIT/ML ~~LOC~~ SOLN
10.0000 [IU] | Freq: Every day | SUBCUTANEOUS | Status: DC
  Administered 2016-12-03: 10 [IU] via SUBCUTANEOUS
  Filled 2016-12-03 (×3): qty 0.1

## 2016-12-03 MED ORDER — MAGNESIUM OXIDE 400 (241.3 MG) MG PO TABS
400.0000 mg | ORAL_TABLET | Freq: Two times a day (BID) | ORAL | Status: DC
  Administered 2016-12-03 – 2016-12-04 (×3): 400 mg via ORAL
  Filled 2016-12-03 (×3): qty 1

## 2016-12-03 MED ORDER — PANTOPRAZOLE SODIUM 40 MG PO TBEC
40.0000 mg | DELAYED_RELEASE_TABLET | Freq: Every day | ORAL | Status: DC
  Administered 2016-12-03: 40 mg via ORAL
  Filled 2016-12-03: qty 1

## 2016-12-03 NOTE — Progress Notes (Signed)
Pt is a high fall risk, explained need for the alarm and pt refused alarm.

## 2016-12-03 NOTE — Progress Notes (Signed)
He is off vasopressors and is comfortable with no respiratory distress and no medical needs that require ICU/SD U. I have discussed with Dr. Verdell Carmine. We will transfer to [place] floor. After transfer,PCCM will sign off. Please call if we can be of further assistance  Merton Border, MD PCCM service Mobile [phone] Pager [phone] 12/03/2016 12:38 PM

## 2016-12-03 NOTE — Progress Notes (Signed)
Pharmacy Consult for Electrolyte Monitoring and Replacment Indication: hypokalemia and hypomagenesemia  Allergies  Allergen Reactions  . Aspirin Itching and Other (See Comments)    Reaction: abdominal pain    Patient Measurements: Height: 5\' 8"  (172.7 cm) Weight: 121 lb 11.2 oz (55.2 kg) IBW/kg (Calculated) : 68.4  Vital Signs: Temp: 97.8 F (36.6 C) (06/06 0947) Temp Source: Oral (06/06 0947) BP: 96/70 (06/06 0947) Pulse Rate: 86 (06/06 0947) Intake/Output from previous day: 06/05 0701 - 06/06 0700 In: 2250.8 [P.O.:240; I.V.:1010.8; IV Piggyback:1000] Out: 500 [Urine:500] Intake/Output from this shift: Total I/O In: 275 [I.V.:175; IV Piggyback:100] Out: -   Labs:  Recent Labs  11/30/16 2033 12/01/16 0927 12/02/16 1306 12/02/16 2107 12/03/16 0308  WBC 6.8 5.0  --  8.5 5.9  HGB 11.0* 9.3* 10.5* 10.9* 8.9*  HCT 32.0* 27.9* 31.0* 32.5* 27.0*  PLT 183 142*  --  142* 122*  CREATININE 0.99 0.71  --  0.71  --   MG  --   --   --   --  1.4*  ALBUMIN 2.7*  --   --   --   --   PROT 5.6*  --   --   --   --   AST 74*  --   --   --   --   ALT 41  --   --   --   --   ALKPHOS 165*  --   --   --   --   BILITOT 0.7  --   --   --   --    Estimated Creatinine Clearance: 80.5 mL/min (by C-G formula based on SCr of 0.71 mg/dL).   Calcium (mg/dL)  Date Value  12/02/2016 7.3 (L)   Calcium, Total (mg/dL)  Date Value  07/20/2014 9.1   Potassium (mmol/L)  Date Value  12/02/2016 3.1 (L)  07/20/2014 3.0 (L)       Medical History: Past Medical History:  Diagnosis Date  . Anemia   . BPH without obstruction/lower urinary tract symptoms   . Cellulitis of scrotum   . Cyst of prostate 05/13/2015  . Diabetes ([place])   . Difficulty urinating   . Epididymoorchitis   . ETOH abuse   . Fracture of right tibia and fibula   . GERD (gastroesophageal reflux disease)   . GI bleed   . Pancreatitis   . Trichomoniasis     Assessment: 56 y/o M with a h/o pancreatitis admitted  with diarrhea and syncope.   Plan:  Potassium replaced with 40 meq po once per MD. Will order magnesium sulfate 4 g iv once and f/u PM K.   Ulice Dash D 12/03/2016,2:12 PM

## 2016-12-03 NOTE — Progress Notes (Signed)
Report given to [facility] on 2c. Pt to room via wc.

## 2016-12-03 NOTE — Consult Note (Signed)
SURGICAL CONSULTATION NOTE (initial) - cpt: 99254  HISTORY OF PRESENT ILLNESS (HPI):  56 y.o. male presented to [facility] ED with chronic LUQ/Left-sided abdominal pain x 10 years s/p what sounds like cystgastrostomy for pseudocyst [facility], secondary to chronic alcohol-induced pancreatitis) and recurrent syncopal episodes attributed to chronic diarrhea x "years" and generalized malaise. Upon further questioning, patient also reported chronic blood per rectum with anemia, also x "years" that he attributed to bleeding hemorrhoids despite his brother having died 1 year ago from stomach cancer (diagnosed 57 y.o.) and his father having been diagnosed with unspecified cancer. Patient reports that he last consumed alcohol this past December (2017) and hasn't used recreational drugs since marijuana 20 years ago, though his family member bedside shook her head and indicated otherwise, refusing to specify. Patient yesterday underwent EGD and colonoscopy with biopsies of masses in the rectum, sigmoid colon, and stomach. Patient otherwise reports tolerating regular diet with +flatus and decreased diarrhea since admission, denies CP, SOB, or fever/chills. He also states that he doesn't want "to be cut" if a "big cancer" is found, though patient seemed amenable to waiting until more is known before making such a decision.  Surgery is consulted by medical physician Dr. Verdell Carmine in this context for evaluation and management of colon, rectal, and stomach polyps and masses.  PAST MEDICAL HISTORY (PMH):  Past Medical History:  Diagnosis Date  . Anemia   . BPH without obstruction/lower urinary tract symptoms   . Cellulitis of scrotum   . Cyst of prostate 05/13/2015  . Diabetes ([place])   . Difficulty urinating   . Epididymoorchitis   . ETOH abuse   . Fracture of right tibia and fibula   . GERD (gastroesophageal reflux disease)   . GI bleed   . Pancreatitis   . Trichomoniasis      PAST SURGICAL HISTORY ([place]):   Past Surgical History:  Procedure Laterality Date  . COLONOSCOPY WITH PROPOFOL N/A 12/02/2016   Procedure: COLONOSCOPY WITH PROPOFOL;  Surgeon: Lucilla Lame, MD;  Location: [facility] ENDOSCOPY;  Service: Endoscopy;  Laterality: N/A;  . ESOPHAGOGASTRODUODENOSCOPY (EGD) WITH PROPOFOL N/A 12/02/2016   Procedure: ESOPHAGOGASTRODUODENOSCOPY (EGD) WITH PROPOFOL;  Surgeon: Lucilla Lame, MD;  Location: [facility] ENDOSCOPY;  Service: Endoscopy;  Laterality: N/A;  . FRACTURE SURGERY     TIBIA AND FIBULA  . FRACTURE SURGERY    . resection of pancreas    . SCROTAL EXPLORATION       MEDICATIONS:  Prior to Admission medications   Medication Sig Start Date End Date Taking? Authorizing Provider  CREON 36000 units CPEP capsule Take 36,000 Units by mouth 5 (five) times daily as needed. Take 36,000 Units by mouth 3 times daily with meals and twice daily with snacks. 10/10/16  Yes [provider]  fluticasone (FLONASE) 50 MCG/ACT nasal spray Place 2 sprays into both nostrils daily. 10/08/16  Yes [provider]  LANTUS SOLOSTAR 100 UNIT/ML Solostar Pen Inject 10 Units into the skin 2 (two) times daily. 07/02/16  Yes Harvest Dark, MD  loperamide (IMODIUM A-D) 2 MG tablet Take 1 tablet (2 mg total) by mouth 4 (four) times daily as needed for diarrhea or loose stools. 07/02/16  Yes Harvest Dark, MD  traZODone (DESYREL) 50 MG tablet Take 50 mg by mouth at bedtime. 10/08/16  Yes [provider]  B-D UF III MINI PEN NEEDLES 31G X 5 MM MISC  10/17/16   [provider]  ciprofloxacin (CIPRO) 500 MG tablet Take 1 tablet (500 mg total)  by mouth 2 (two) times daily. Patient not taking: Reported on 12/02/2016 08/17/16   Paulette Blanch, MD  lisinopril (PRINIVIL,ZESTRIL) 10 MG tablet Take 10 mg by mouth daily.    [provider]  NOVOLOG FLEXPEN 100 UNIT/ML FlexPen Inject 48 Units into the skin at bedtime.  05/16/15   [provider]  Oxycodone HCl 10 MG TABS Take by mouth. 02/10/15    [provider]  pantoprazole (PROTONIX) 40 MG tablet Take by mouth. 04/30/16   [provider]  pravastatin (PRAVACHOL) 40 MG tablet take 1 tablet by mouth every evening for cholesterol 06/18/15   [provider]  pregabalin (LYRICA) 50 MG capsule Take 50 mg by mouth. 04/17/16   [provider]     ALLERGIES:  Allergies  Allergen Reactions  . Aspirin Itching and Other (See Comments)    Reaction: abdominal pain     SOCIAL HISTORY:  Social History   Social History  . Marital status: Single    Spouse name: N/A  . Number of children: N/A  . Years of education: N/A   Occupational History  . Not on file.   Social History Main Topics  . Smoking status: Current Every Day Smoker    Packs/day: 0.00  . Smokeless tobacco: Never Used  . Alcohol use 0.0 oz/week     Comment: occ  . Drug use: No  . Sexual activity: Not on file   Other Topics Concern  . Not on file   Social History Narrative  . No narrative on file    The patient currently resides (home / rehab facility / nursing home): Home  The patient normally is (ambulatory / bedbound): Ambulatory   FAMILY HISTORY:  Family History  Problem Relation Age of Onset  . Cancer Father     REVIEW OF SYSTEMS:  Constitutional: dizziness/syncope as per HPI, denies weight loss, fever, chills, or sweats  Eyes: denies any other vision changes, history of eye injury  ENT: denies sore throat, hearing problems Respiratory: denies shortness of breath, wheezing  Cardiovascular: denies chest pain, palpitations  Gastrointestinal: abdominal pain, N/V, and bowel function as per HPI Genitourinary: denies burning with urination or urinary frequency Musculoskeletal: denies any other joint pains or cramps  Skin: denies any other rashes or skin discolorations  Neurological: denies any other headache, dizziness, weakness  Psychiatric: denies any other depression, anxiety   All other review of systems were  negative   VITAL SIGNS:  Temp:  [89.2 F (31.8 C)-99.9 F (37.7 C)] 97.2 F (36.2 C) (06/06 1537) Pulse Rate:  [56-91] 61 (06/06 1200) Resp:  [11-18] 12 (06/06 1537) BP: (66-136)/(44-86) 91/61 (06/06 1537) SpO2:  [98 %-100 %] 100 % (06/06 1537)     Height: 5\' 8"  (172.7 cm) Weight: 121 lb 11.2 oz (55.2 kg) BMI (Calculated): 18.5   INTAKE/OUTPUT:  This shift: Total I/O In: 450 [I.V.:350; IV Piggyback:100] Out: 825 [Urine:825]  Last 2 shifts: @IOLAST2SHIFTS @   PHYSICAL EXAM:  Constitutional:  -- Normal body habitus  -- Awake, alert, and oriented x3  Eyes:  -- Pupils equally round and reactive to light  -- No scleral icterus  Ear, nose, and throat:  -- No jugular venous distension  Pulmonary:  -- No crackles  -- Equal breath sounds bilaterally -- Breathing non-labored at rest Cardiovascular:  -- S1, S2 present  -- No pericardial rubs Gastrointestinal:  -- Abdomen soft, minimal LUQ > Left-sided abdominal tenderness to palpation, nondistended, no guarding/rebound  -- No abdominal masses appreciated,  pulsatile or otherwise Musculoskeletal and Integumentary:  -- Wounds or skin discoloration: None appreciated except well-healed upper abdominal linear post-surgical incisional scar -- Extremities: B/L UE and LE FROM, hands and feet warm, no edema  Neurologic:  -- Motor function: intact and symmetric -- Sensation: intact and symmetric  Labs: CBC Latest Ref Rng & Units 12/03/2016 12/02/2016 12/02/2016  WBC 3.8 - 10.6 K/uL 5.9 8.5 -  Hemoglobin 13.0 - 18.0 g/dL 8.9(L) 10.9(L) 10.5(L)  Hematocrit 40.0 - 52.0 % 27.0(L) 32.5(L) 31.0(L)  Platelets 150 - 440 K/uL 122(L) 142(L) -   CMP Latest Ref Rng & Units 12/03/2016 12/02/2016 12/02/2016  Glucose 65 - 99 mg/dL - 176(H) 85  BUN 6 - 20 mg/dL - 8 -  Creatinine 0.61 - 1.24 mg/dL - 0.71 -  Sodium 135 - 145 mmol/L - 139 139  Potassium 3.5 - 5.1 mmol/L 3.5 3.1(L) 3.5  Chloride 101 - 111 mmol/L - 116(H) -  CO2 22 - 32 mmol/L - 17(L) -  Calcium  8.9 - 10.3 mg/dL - 7.3(L) -  Total Protein 6.5 - 8.1 g/dL - - -  Total Bilirubin 0.3 - 1.2 mg/dL - - -  Alkaline Phos 38 - 126 U/L - - -  AST 15 - 41 U/L - - -  ALT 17 - 63 U/L - - -    Imaging studies:  Colonoscopy (12/02/2016) - Perianal and digital rectal examinations were normal. - Three pedunculated polyps were found in the ascending colon. The polyps were 7 to 9 mm in size. These polyps were removed with a hot snare. Resection and retrieval were complete. - Two pedunculated polyps were found in the transverse colon. The polyps were 4 to 6 mm in size. These polyps were removed with a hot snare. Resection and retrieval were complete. - A non-obstructing large mass was found in the sigmoid colon. The mass was non-circumferential. - No bleeding was present. This was biopsied with a cold forceps for histology. Area was tattooed with an injection of 5 mL of [place] ink. - A 15 mm polyp was found in the rectum. The polyp was sessile. The polyp was removed with a hot snare. Resection and retrieval were complete.  EGD (12/02/2016) - The examined esophagus was normal. - A large, sessile, non-circumferential mass with no bleeding and no stigmata of recent bleeding was found in the cardia. Biopsies were taken with a cold forceps for histology. - The examined duodenum was normal.  CT Abdomen and Pelvis with Contrast (11/30/2016) Stomach is within normal limits. Appendix appears normal. No evidence of bowel wall thickening, distention, or inflammatory changes. Postsurgical changes are noted in the small bowel in the left mid abdomen.  Hyperenhancing lesion is again seen in the posterior aspect of the right lobe consistent with hemangioma. This is stable from the prior exam. No gallstones, gallbladder wall thickening, or biliary dilatation.  Aortic atherosclerosis. Few small gastrohepatic ligament lymph nodes are noted. The largest of these measures 11 mm in short axis best seen on image  number 22 of series 2. This is slightly larger than that seen on the prior exam.  Assessment/Plan: (ICD-10's: K60.5) 56 y.o. male with multiple distinct masses of the rectum, sigmoid colon, and stomach, all independently concerning for malignancy and complicated by prior pancreatic surgery and pertinent comorbidities including DM, blood-loss anemia, polysubstance abuse, medical non-compliance, and GERD.   - follow-up pending pathology results  - will defer surgical planning until pathology available  - avoidance of reported alcohol and recreational drug  abuse  - medical management of comorbidities per medical team  - DVT prophylaxis, ambulation encouraged  All of the above findings and recommendations were discussed with the patient and his family (bedside), and all of patient's and his family's questions were answered to their expressed satisfaction.  Thank you for the opportunity to participate in this patient's care.   -- Marilynne Drivers Rosana Hoes, MD, [place]: [facility] - Partnering for exceptional care. Office: [phone]

## 2016-12-03 NOTE — Progress Notes (Signed)
[name] at [place] NAME: [name]    MR#:  [id number]  DATE OF BIRTH:  11-23-1960  SUBJECTIVE:   Patient post endoscopy colonoscopy yesterday became severely hypotensive and did not respond to IV fluids and transferred to the intensive care unit for closer monitoring. Was on IV vasopressors yesterday but now weaned off. Blood pressure much improved today. Patient tolerating by mouth well.  REVIEW OF SYSTEMS:    Review of Systems  Constitutional: Negative for chills and fever.  HENT: Negative for congestion and tinnitus.   Eyes: Negative for blurred vision and double vision.  Respiratory: Negative for cough, shortness of breath and wheezing.   Cardiovascular: Negative for chest pain, orthopnea and PND.  Gastrointestinal: Positive for diarrhea. Negative for abdominal pain, nausea and vomiting.  Genitourinary: Negative for dysuria and hematuria.  Neurological: Negative for dizziness, sensory change and focal weakness.  All other systems reviewed and are negative.   Nutrition: Regular diet Tolerating Diet: Yes Tolerating PT: Await Eval.     DRUG ALLERGIES:   Allergies  Allergen Reactions  . Aspirin Itching and Other (See Comments)    Reaction: abdominal pain    VITALS:  Blood pressure 96/70, pulse 86, temperature 97.8 F (36.6 C), temperature source Oral, resp. rate 14, height 5\' 8"  (1.727 m), weight 55.2 kg (121 lb 11.2 oz), SpO2 98 %.  PHYSICAL EXAMINATION:   Physical Exam  GENERAL:  56 y.o.-year-old thin patient lying in bed in no acute distress.  EYES: Pupils equal, round, reactive to light and accommodation. No scleral icterus. Extraocular muscles intact.  HEENT: Head atraumatic, normocephalic. Oropharynx and nasopharynx clear.  NECK:  Supple, no jugular venous distention. No thyroid enlargement, no tenderness.  LUNGS: Normal breath sounds bilaterally, no wheezing, rales, rhonchi. No use of accessory muscles of  respiration.  CARDIOVASCULAR: S1, S2 normal. No murmurs, rubs, or gallops.  ABDOMEN: Soft, nontender, nondistended. Bowel sounds present. No organomegaly or mass.  EXTREMITIES: No cyanosis, clubbing or edema b/l.    NEUROLOGIC: Cranial nerves II through XII are intact. No focal Motor or sensory deficits b/l. Globally weak.   PSYCHIATRIC: The patient is alert and oriented x 3.  SKIN: No obvious rash, lesion, or ulcer.    LABORATORY PANEL:   CBC  Recent Labs Lab 12/03/16 0308  WBC 5.9  HGB 8.9*  HCT 27.0*  PLT 122*   ------------------------------------------------------------------------------------------------------------------  Chemistries   Recent Labs Lab 11/30/16 2033  12/02/16 2107 12/03/16 0308  NA 130*  < > 139  --   K 3.5  < > 3.1*  --   CL 100*  < > 116*  --   CO2 20*  < > 17*  --   GLUCOSE 422*  < > 176*  --   BUN 16  < > 8  --   CREATININE 0.99  < > 0.71  --   CALCIUM 8.1*  < > 7.3*  --   MG  --   --   --  1.4*  AST 74*  --   --   --   ALT 41  --   --   --   ALKPHOS 165*  --   --   --   BILITOT 0.7  --   --   --   < > = values in this interval not displayed. ------------------------------------------------------------------------------------------------------------------  Cardiac Enzymes  Recent Labs Lab 12/01/16 1449  TROPONINI <0.03   ------------------------------------------------------------------------------------------------------------------  RADIOLOGY:  No results found.  ASSESSMENT AND PLAN:   56 year old male with past medical history of chronic pancreatitis, alcohol abuse, diabetes, BPH, GERD, previous history of GI bleed who presents to the hospital due to a syncopal episode and also ongoing diarrhea.  1. Syncope- suspected to be due to Orthostatic hypotension. No further episodes.  - CT head (-).  CE X 3 have been (-).   2. Hypovolemic shock - patient became profoundly hypotensive yesterday after his colonoscopy and  endoscopy. He was transferred to the intensive care unit and given IV fluids and started on IV vasopressors. Hemodynamics much improved today.  -Continue IV fluids, encourage by mouth intake. Much improved today.  3. Diarrhea-chronic for the patient as he had it for years. Questionable related to chronic pancreatitis. -Seems like a secretory diarrhea as patient has symptoms despite eating. Seen by gastroenterology and status post endoscopy and colonoscopy today. Endoscopy showing possible gastric mass but no obstruction biopsy results are pending. -Colonoscopy yesterday showing multiple polyps which were removed. Patient also had a sigmoid mass which is concerning for malignancy which was biopsied. Await Path results.  - tolerating PO well.   4. Sigmoid/Gastric Mass - noted to endoscopy/colonoscopy.  - Appreciate Surgical consult and no plans for surgery ?? Referral to tertiary care center.  -  No evidence of obstruction.   5. History of alcohol abuse-continue CIWA protocol. - cont. Folate, Thiamine. No evidence of withdrawal.  6. Hypokalemia - improved w/ supplementation.   7. Hyperlipidemia-continue Pravachol.  8. Neuropathy-continue Lyrica, Neurontin.  9. GERD-continue Protonix.  Transfer to floor/PT consult.   All the records are reviewed and case discussed with Care Management/Social Worker. Management plans discussed with the patient, family and they are in agreement.  CODE STATUS: Full Code  DVT Prophylaxis: Lovenox  TOTAL TIME TAKING CARE OF THIS PATIENT: 30 minutes.   POSSIBLE D/C IN 2-3 DAYS, DEPENDING ON CLINICAL CONDITION.   Henreitta Leber M.D on 12/03/2016 at 3:18 PM  Between 7am to 6pm - Pager - [phone]  After 6pm go to [email or web address] - Proofreader  [facility]  Office  [phone]  CC: Primary care physician; [name], MD

## 2016-12-03 NOTE — Progress Notes (Signed)
Pharmacy Consult for Electrolyte Monitoring and Replacment Indication: hypokalemia and hypomagenesemia  Allergies  Allergen Reactions  . Aspirin Itching and Other (See Comments)    Reaction: abdominal pain    Patient Measurements: Height: 5\' 8"  (172.7 cm) Weight: 121 lb 11.2 oz (55.2 kg) IBW/kg (Calculated) : 68.4  Vital Signs: Temp: 97.2 F (36.2 C) (06/06 1537) Temp Source: Oral (06/06 1537) BP: 91/61 (06/06 1537) Pulse Rate: 61 (06/06 1200) Intake/Output from previous day: 06/05 0701 - 06/06 0700 In: 2250.8 [P.O.:240; I.V.:1010.8; IV Piggyback:1000] Out: 500 [Urine:500] Intake/Output from this shift: Total I/O In: 400 [I.V.:300; IV Piggyback:100] Out: 825 [Urine:825]  Labs:  Recent Labs  11/30/16 2033 12/01/16 0927 12/02/16 1306 12/02/16 2107 12/03/16 0308  WBC 6.8 5.0  --  8.5 5.9  HGB 11.0* 9.3* 10.5* 10.9* 8.9*  HCT 32.0* 27.9* 31.0* 32.5* 27.0*  PLT 183 142*  --  142* 122*  CREATININE 0.99 0.71  --  0.71  --   MG  --   --   --   --  1.4*  ALBUMIN 2.7*  --   --   --   --   PROT 5.6*  --   --   --   --   AST 74*  --   --   --   --   ALT 41  --   --   --   --   ALKPHOS 165*  --   --   --   --   BILITOT 0.7  --   --   --   --    Estimated Creatinine Clearance: 80.5 mL/min (by C-G formula based on SCr of 0.71 mg/dL).   Calcium (mg/dL)  Date Value  12/02/2016 7.3 (L)   Calcium, Total (mg/dL)  Date Value  07/20/2014 9.1   Potassium (mmol/L)  Date Value  12/03/2016 3.5  07/20/2014 3.0 (L)       Medical History: Past Medical History:  Diagnosis Date  . Anemia   . BPH without obstruction/lower urinary tract symptoms   . Cellulitis of scrotum   . Cyst of prostate 05/13/2015  . Diabetes ([place])   . Difficulty urinating   . Epididymoorchitis   . ETOH abuse   . Fracture of right tibia and fibula   . GERD (gastroesophageal reflux disease)   . GI bleed   . Pancreatitis   . Trichomoniasis     Assessment: 56 y/o M with a h/o pancreatitis  admitted with diarrhea and syncope.   Plan:  Potassium replaced with 40 meq po once per MD. Will order magnesium sulfate 4 g iv once and f/u PM K.   Addendum: Repeat K is WNL. Will f/u AM labs.   Ulice Dash D 12/03/2016,4:07 PM

## 2016-12-03 NOTE — Progress Notes (Signed)
Inpatient Diabetes Program Recommendations  AACE/ADA: New Consensus Statement on Inpatient Glycemic Control (2015)  Target Ranges:  Prepandial:   less than 140 mg/dL      Peak postprandial:   less than 180 mg/dL (1-2 hours)      Critically ill patients:  140 - 180 mg/dL   Results for HAEDEN, HUDOCK (MRN [id number]) as of 12/03/2016 14:21  Ref. Range 12/02/2016 07:50 12/02/2016 08:25 12/02/2016 11:54 12/02/2016 13:04 12/02/2016 16:57 12/02/2016 17:59 12/02/2016 21:38  Glucose-Capillary Latest Ref Range: 65 - 99 mg/dL 64 (L) 138 (H) 84 89 190 (H) 212 (H) 169 (H)   Results for JAHMANI, STAUP (MRN [id number]) as of 12/03/2016 14:21  Ref. Range 12/03/2016 07:47 12/03/2016 11:50  Glucose-Capillary Latest Ref Range: 65 - 99 mg/dL 282 (H) 185 (H)    Admit Syncope/ Diarrhea.   History: DM, ETOH Abuse, Pancreatitis  Home DM Meds: Lantus 40-48 units QHS + Novolog 10 units TID (called [facility] to verify)  Current Orders: Novolog Sensitive Correction Scale/ SSI (0-9 units) TID AC + HS       MD- Note pt's CBG this AM elevated to 282 mg/dl.  Per records, pt supposed to be taking Lantus at home.  Please consider starting a portion of pt's home dose of Lantus:  Recommend Lantus 10 units daily to start (25% total home dose)     --Will follow patient during hospitalization--  Wyn Quaker RN, MSN, CDE Diabetes Coordinator Inpatient Glycemic Control Team Team Pager: [phone] (8a-5p)

## 2016-12-04 DIAGNOSIS — K635 Polyp of colon: Secondary | ICD-10-CM

## 2016-12-04 LAB — BASIC METABOLIC PANEL
Anion gap: 2 — ABNORMAL LOW (ref 5–15)
BUN: <5 mg/dL — ABNORMAL LOW (ref 6–20)
CALCIUM: 7.9 mg/dL — AB (ref 8.9–10.3)
CO2: 22 mmol/L (ref 22–32)
CREATININE: 0.71 mg/dL (ref 0.61–1.24)
Chloride: 116 mmol/L — ABNORMAL HIGH (ref 101–111)
GLUCOSE: 127 mg/dL — AB (ref 65–99)
Potassium: 3.9 mmol/L (ref 3.5–5.1)
Sodium: 140 mmol/L (ref 135–145)

## 2016-12-04 LAB — GLUCOSE, CAPILLARY
Glucose-Capillary: 107 mg/dL — ABNORMAL HIGH (ref 65–99)
Glucose-Capillary: 182 mg/dL — ABNORMAL HIGH (ref 65–99)

## 2016-12-04 LAB — CBC
HCT: 28.6 % — ABNORMAL LOW (ref 40.0–52.0)
Hemoglobin: 9.3 g/dL — ABNORMAL LOW (ref 13.0–18.0)
MCH: 31.8 pg (ref 26.0–34.0)
MCHC: 32.6 g/dL (ref 32.0–36.0)
MCV: 97.6 fL (ref 80.0–100.0)
PLATELETS: 107 10*3/uL — AB (ref 150–440)
RBC: 2.93 MIL/uL — ABNORMAL LOW (ref 4.40–5.90)
RDW: 14.9 % — AB (ref 11.5–14.5)
WBC: 7.1 10*3/uL (ref 3.8–10.6)

## 2016-12-04 LAB — MAGNESIUM: MAGNESIUM: 2.3 mg/dL (ref 1.7–2.4)

## 2016-12-04 LAB — SURGICAL PATHOLOGY

## 2016-12-04 NOTE — Progress Notes (Signed)
SURGICAL PROGRESS NOTE (cpt [phone])  Hospital Day(s): 2.   Post op day(s): 2 Days Post-Op.   Interval History: Patient seen and examined, no acute events or new complaints overnight. Patient reports tolerating regular diet with +flatus and +BM, denies abdominal pain, N/V, fever/chills, CP, or SOB. Though patient only reports occasional alcohol consumption (most recently December, 2017 when family was visiting), his girlfriend x 4 years states he drinks large volumes of beef and liquor every day, drinking until he passes out every weekend. Patient again reiterates he wants to leave the hospital.  Review of Systems:  Constitutional: denies fever, chills  HEENT: denies cough or congestion  Respiratory: denies any shortness of breath  Cardiovascular: denies chest pain or palpitations  Gastrointestinal: abdominal pain, N/V, and bowel function as per interval history Genitourinary: denies burning with urination or urinary frequency Musculoskeletal: denies pain, decreased motor or sensation Integumentary: denies any other rashes or skin discolorations Neurological: denies HA or vision/hearing changes   Vital signs in last 24 hours: [min-max] current  Temp:  [97.1 F (36.2 C)-97.9 F (36.6 C)] 97.1 F (36.2 C) (06/06 2038) Pulse Rate:  [61-86] 68 (06/07 0501) Resp:  [12-20] 20 (06/07 0501) BP: (87-96)/(61-79) 95/66 (06/07 0501) SpO2:  [98 %-100 %] 100 % (06/07 0501)     Height: 5\' 8"  (172.7 cm) Weight: 121 lb 11.2 oz (55.2 kg) BMI (Calculated): 18.5   Intake/Output this shift:  No intake/output data recorded.   Intake/Output last 2 shifts:  @IOLAST2SHIFTS @   Physical Exam:  Constitutional: alert, cooperative and no distress  HENT: normocephalic without obvious abnormality  Eyes: PERRL, EOM's grossly intact and symmetric  Neuro: CN II - XII grossly intact and symmetric without deficit  Respiratory: breathing non-labored at rest  Cardiovascular: regular rate and sinus rhythm   Gastrointestinal: soft, non-tender, and non-distended  Musculoskeletal: UE and LE FROM, no edema or wounds, motor and sensation grossly intact, NT   Labs:  CBC Latest Ref Rng & Units 12/04/2016 12/03/2016 12/02/2016  WBC 3.8 - 10.6 K/uL 7.1 5.9 8.5  Hemoglobin 13.0 - 18.0 g/dL 9.3(L) 8.9(L) 10.9(L)  Hematocrit 40.0 - 52.0 % 28.6(L) 27.0(L) 32.5(L)  Platelets 150 - 440 K/uL 107(L) 122(L) 142(L)   CMP Latest Ref Rng & Units 12/04/2016 12/03/2016 12/02/2016  Glucose 65 - 99 mg/dL 127(H) - 176(H)  BUN 6 - 20 mg/dL <5(L) - 8  Creatinine 0.61 - 1.24 mg/dL 0.71 - 0.71  Sodium 135 - 145 mmol/L 140 - 139  Potassium 3.5 - 5.1 mmol/L 3.9 3.5 3.1(L)  Chloride 101 - 111 mmol/L 116(H) - 116(H)  CO2 22 - 32 mmol/L 22 - 17(L)  Calcium 8.9 - 10.3 mg/dL 7.9(L) - 7.3(L)  Total Protein 6.5 - 8.1 g/dL - - -  Total Bilirubin 0.3 - 1.2 mg/dL - - -  Alkaline Phos 38 - 126 U/L - - -  AST 15 - 41 U/L - - -  ALT 17 - 63 U/L - - -   Imaging studies: No new pertinent imaging studies  Assessment/Plan: (ICD-10's: K38.5) 56 y.o. male with multiple distinct masses of the rectum, sigmoid colon, and stomach, all independently concerning for malignancy and complicated by prior pancreatic surgery and pertinent comorbidities including DM, blood-loss anemia, polysubstance abuse, medical non-compliance, and GERD.              - follow-up pending pathology results             - will defer surgical planning until pathology available             -  avoidance of reported alcohol and recreational drug abuse             - medical management of comorbidities per medical team             - DVT prophylaxis, ambulation encouraged  All of the above findings and recommendations were discussed with the patient and his family (bedside), and all of patient's and his family's questions were answered to their expressed satisfaction.  Thank you for the opportunity to participate in this patient's care.   -- Marilynne Drivers Rosana Hoes, MD, [place]: [facility] - Partnering for exceptional care. Office: [phone]

## 2016-12-04 NOTE — Discharge Summary (Signed)
[facility] NAME: [name]    MR#:  [id number]  DATE OF BIRTH:  31-Mar-1961  DATE OF ADMISSION:  11/30/2016 ADMITTING PHYSICIAN: Lance Coon, MD  DATE OF DISCHARGE: 12/04/2016  1:29 PM  PRIMARY CARE PHYSICIAN: [name], MD    ADMISSION DIAGNOSIS:  Hyponatremia [E87.1] Hyperglycemia [U54.2] Alcoholic intoxication without complication (HCC) [H06.237] Hypotension, unspecified hypotension type [I95.9] Syncope, unspecified syncope type [R55]  DISCHARGE DIAGNOSIS:  Principal Problem:   Syncope Active Problems:   Type 2 diabetes mellitus (HCC)   GERD (gastroesophageal reflux disease)   ETOH abuse   BPH (benign prostatic hyperplasia)   Chronic diarrhea   Acute posthemorrhagic anemia   Benign neoplasm of ascending colon   Rectal polyp   Noninfectious diarrhea   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 Anemia   . BPH without obstruction/lower urinary tract symptoms   . Cellulitis of scrotum   . Cyst of prostate 05/13/2015  . Diabetes ([place])   . Difficulty urinating   . Epididymoorchitis   . ETOH abuse   . Fracture of right tibia and fibula   . GERD (gastroesophageal reflux disease)   . GI bleed   . Pancreatitis   . Trichomoniasis     HOSPITAL COURSE:   56 year old male with past medical history of chronic pancreatitis, alcohol abuse, diabetes, BPH, GERD, previous history of GI bleed who presents to the hospital due to a syncopal episode and also ongoing diarrhea.  1. Syncope- suspected to be due to Orthostatic hypotension. No further episodes.  -Orthostasis has resolved and no further episodes of syncope. - CT head (-).  CE X 3 have been (-).   2. Hypovolemic shock - patient became profoundly hypotensive yesterday after his colonoscopy and endoscopy. He was transferred to the intensive care unit and given IV  fluids and started on IV vasopressors.  -Hemodynamics much improved now. No longer orthostatic. Hemodynamically stable and asymptomatic.  3. Diarrhea-chronic for the patient as he had it for years.  -Seems like a secretory diarrhea as patient has symptoms despite eating. Seen by gastroenterology and status post endoscopy and colonoscopy today. Endoscopy showing possible gastric mass but no obstruction biopsy results are pending and to be followed up as outpatient. -Colonoscopy yesterday showing multiple polyps which were removed. Patient also had a sigmoid mass which is concerning for malignancy which was biopsied, follow up Path results as outpatient. - tolerating PO well and diarrhea improved and being discharged home.  4. Sigmoid/Gastric Mass - noted to endoscopy/colonoscopy.  - Appreciate Surgical consult and no plans for surgery urgently. Follow up path results as outpatient and then discuss plan of care with surgery.  Possible referral to tertiary care center given his Pancreatic surgery and it being a complicated case.   -  No evidence of obstruction clinically and being discharged home.    5. History of alcohol abuse-continue CIWA protocol. Had no withdrawal symptoms while in the hospital. - cont. Folate, Thiamine.  6. Hypokalemia - improved and resolved w/ supplementation.  7. Hyperlipidemia- he will continue Pravachol.  8. Neuropathy- he will continue Lyrica, Neurontin.  9. GERD- he will continue Protonix.  DISCHARGE CONDITIONS:   Stable.   CONSULTS OBTAINED:  Treatment Team:  Lucilla Lame, MD Vickie Epley, MD  DRUG ALLERGIES:   Allergies  Allergen Reactions  . Aspirin Itching and Other (See  Comments)    Reaction: abdominal pain    DISCHARGE MEDICATIONS:   Allergies as of 12/04/2016      Reactions   Aspirin Itching, Other (See Comments)   Reaction: abdominal pain      Medication List    TAKE these medications   B-D UF III MINI PEN NEEDLES 31G X 5  MM Misc Generic drug:  Insulin Pen Needle   ciprofloxacin 500 MG tablet Commonly known as:  CIPRO Take 1 tablet (500 mg total) by mouth 2 (two) times daily.   CREON 36000 UNITS Cpep capsule Generic drug:  lipase/protease/amylase Take 36,000 Units by mouth 5 (five) times daily as needed. Take 36,000 Units by mouth 3 times daily with meals and twice daily with snacks.   fluticasone 50 MCG/ACT nasal spray Commonly known as:  FLONASE Place 2 sprays into both nostrils daily.   LANTUS SOLOSTAR 100 UNIT/ML Solostar Pen Generic drug:  Insulin Glargine Inject 10 Units into the skin 2 (two) times daily.   lisinopril 10 MG tablet Commonly known as:  PRINIVIL,ZESTRIL Take 10 mg by mouth daily.   loperamide 2 MG tablet Commonly known as:  IMODIUM A-D Take 1 tablet (2 mg total) by mouth 4 (four) times daily as needed for diarrhea or loose stools.   NOVOLOG FLEXPEN 100 UNIT/ML FlexPen Generic drug:  insulin aspart Inject 48 Units into the skin at bedtime.   Oxycodone HCl 10 MG Tabs Take by mouth.   pantoprazole 40 MG tablet Commonly known as:  PROTONIX Take by mouth.   pravastatin 40 MG tablet Commonly known as:  PRAVACHOL take 1 tablet by mouth every evening for cholesterol   pregabalin 50 MG capsule Commonly known as:  LYRICA Take 50 mg by mouth.   traZODone 50 MG tablet Commonly known as:  DESYREL Take 50 mg by mouth at bedtime.         DISCHARGE INSTRUCTIONS:   DIET:  Diabetic diet  DISCHARGE CONDITION:  Stable  ACTIVITY:  Activity as tolerated  OXYGEN:  Home Oxygen: No.   Oxygen Delivery: room air  DISCHARGE LOCATION:  home   If you experience worsening of your admission symptoms, develop shortness of breath, life threatening emergency, suicidal or homicidal thoughts you must seek medical attention immediately by calling 911 or calling your MD immediately  if symptoms less severe.  You Must read complete instructions/literature along with all the  possible adverse reactions/side effects for all the Medicines you take and that have been prescribed to you. Take any new Medicines after you have completely understood and accpet all the possible adverse reactions/side effects.   Please note  You were cared for by a hospitalist during your hospital stay. If you have any questions about your discharge medications or the care you received while you were in the hospital after you are discharged, you can call the unit and asked to speak with the hospitalist on call if the hospitalist that took care of you is not available. Once you are discharged, your primary care physician will handle any further medical issues. Please note that NO REFILLS for any discharge medications will be authorized once you are discharged, as it is imperative that you return to your primary care physician (or establish a relationship with a primary care physician if you do not have one) for your aftercare needs so that they can reassess your need for medications and monitor your lab values.     Today   Diarrhea has improved.  No abdominal pain,  N/V. Tolerating regular diet well.    VITAL SIGNS:  Blood pressure 97/73, pulse 72, temperature 97.1 F (36.2 C), temperature source Oral, resp. rate 20, height 5\' 8"  (1.727 m), weight 55.2 kg (121 lb 11.2 oz), SpO2 100 %.  I/O:   Intake/Output Summary (Last 24 hours) at 12/04/16 1527 Last data filed at 12/04/16 1230  Gross per 24 hour  Intake             1219 ml  Output              825 ml  Net              394 ml    PHYSICAL EXAMINATION:   GENERAL:  56 y.o.-year-old thin patient lying in bed in no acute distress.  EYES: Pupils equal, round, reactive to light and accommodation. No scleral icterus. Extraocular muscles intact.  HEENT: Head atraumatic, normocephalic. Oropharynx and nasopharynx clear.  NECK:  Supple, no jugular venous distention. No thyroid enlargement, no tenderness.  LUNGS: Normal breath sounds  bilaterally, no wheezing, rales, rhonchi. No use of accessory muscles of respiration.  CARDIOVASCULAR: S1, S2 normal. No murmurs, rubs, or gallops.  ABDOMEN: Soft, nontender, nondistended. Bowel sounds present. No organomegaly or mass.  EXTREMITIES: No cyanosis, clubbing or edema b/l.    NEUROLOGIC: Cranial nerves II through XII are intact. No focal Motor or sensory deficits b/l. Globally weak.   PSYCHIATRIC: The patient is alert and oriented x 3.  SKIN: No obvious rash, lesion, or ulcer.   DATA REVIEW:   CBC  Recent Labs Lab 12/04/16 0535  WBC 7.1  HGB 9.3*  HCT 28.6*  PLT 107*    Chemistries   Recent Labs Lab 11/30/16 2033  12/04/16 0535  NA 130*  < > 140  K 3.5  < > 3.9  CL 100*  < > 116*  CO2 20*  < > 22  GLUCOSE 422*  < > 127*  BUN 16  < > <5*  CREATININE 0.99  < > 0.71  CALCIUM 8.1*  < > 7.9*  MG  --   < > 2.3  AST 74*  --   --   ALT 41  --   --   ALKPHOS 165*  --   --   BILITOT 0.7  --   --   < > = values in this interval not displayed.  Cardiac Enzymes  Recent Labs Lab 12/01/16 1449  TROPONINI <0.03    Microbiology Results  Results for orders placed or performed during the hospital encounter of 11/30/16  Gastrointestinal Panel by PCR , Stool     Status: None   Collection Time: 12/01/16  1:37 AM  Result Value Ref Range Status   Campylobacter species NOT DETECTED NOT DETECTED Final   Plesimonas shigelloides NOT DETECTED NOT DETECTED Final   Salmonella species NOT DETECTED NOT DETECTED Final   Yersinia enterocolitica NOT DETECTED NOT DETECTED Final   Vibrio species NOT DETECTED NOT DETECTED Final   Vibrio cholerae NOT DETECTED NOT DETECTED Final   Enteroaggregative E coli (EAEC) NOT DETECTED NOT DETECTED Final   Enteropathogenic E coli (EPEC) NOT DETECTED NOT DETECTED Final   Enterotoxigenic E coli (ETEC) NOT DETECTED NOT DETECTED Final   Shiga like toxin producing E coli (STEC) NOT DETECTED NOT DETECTED Final   Shigella/Enteroinvasive E coli  (EIEC) NOT DETECTED NOT DETECTED Final   Cryptosporidium NOT DETECTED NOT DETECTED Final   Cyclospora cayetanensis NOT DETECTED NOT DETECTED Final   Entamoeba  histolytica NOT DETECTED NOT DETECTED Final   Giardia lamblia NOT DETECTED NOT DETECTED Final   Adenovirus F40/41 NOT DETECTED NOT DETECTED Final   Astrovirus NOT DETECTED NOT DETECTED Final   Norovirus GI/GII NOT DETECTED NOT DETECTED Final   Rotavirus A NOT DETECTED NOT DETECTED Final   Sapovirus (I, II, IV, and V) NOT DETECTED NOT DETECTED Final  C difficile quick scan w PCR reflex     Status: None   Collection Time: 12/01/16  1:37 AM  Result Value Ref Range Status   C Diff antigen NEGATIVE NEGATIVE Final   C Diff toxin NEGATIVE NEGATIVE Final   C Diff interpretation No C. difficile detected.  Final  MRSA PCR Screening     Status: Abnormal   Collection Time: 12/02/16  6:02 PM  Result Value Ref Range Status   MRSA by PCR POSITIVE (A) NEGATIVE Final    Comment:        The GeneXpert MRSA Assay (FDA approved for NASAL specimens only), is one component of a comprehensive MRSA colonization surveillance program. It is not intended to diagnose MRSA infection nor to guide or monitor treatment for MRSA infections. RESULT CALLED TO, READ BACK BY AND VERIFIED WITH: MICHELLE WILLIAMS @ [id number] ON 12/02/2016 BY CAF     RADIOLOGY:  No results found.    Management plans discussed with the patient, family and they are in agreement.  CODE STATUS:     Code Status Orders        Start     Ordered   12/01/16 0245  Full code  Continuous     12/01/16 0245    Code Status History    Date Active Date Inactive Code Status Order ID Comments User Context   05/12/2015 10:39 PM 05/14/2015  5:23 PM Full Code [id number]  Lance Coon, MD Inpatient      TOTAL TIME TAKING CARE OF THIS PATIENT: 40 minutes.    Henreitta Leber M.D on 12/04/2016 at 3:27 PM  Between 7am to 6pm - Pager - [phone]  After 6pm go to [email or web address] -  Proofreader  [facility]  Office  [phone]  CC: Primary care physician; [name], MD

## 2016-12-04 NOTE — Progress Notes (Signed)
Pt A and O x 4. VSS. Pt tolerating diet well. Complaints of pain with meds given to control, no nausea. IV removed intact, no new prescriptions given. Pt voiced understanding of discharge instructions with no further questions. Pt discharged via wheelchair with axillary.

## 2016-12-04 NOTE — Progress Notes (Signed)
Pharmacy Consult for Electrolyte Monitoring and Replacment Indication: hypokalemia and hypomagenesemia  Allergies  Allergen Reactions  . Aspirin Itching and Other (See Comments)    Reaction: abdominal pain    Patient Measurements: Height: 5\' 8"  (172.7 cm) Weight: 121 lb 11.2 oz (55.2 kg) IBW/kg (Calculated) : 68.4  Vital Signs: BP: 95/66 (06/07 0501) Pulse Rate: 68 (06/07 0501) Intake/Output from previous day: 06/06 0701 - 06/07 0700 In: 1301 [P.O.:480; I.V.:721; IV Piggyback:100] Out: 825 [Urine:825] Intake/Output from this shift: Total I/O In: 104 [I.V.:104] Out: 0   Labs:  Recent Labs  12/02/16 2107 12/03/16 0308 12/04/16 0535  WBC 8.5 5.9 7.1  HGB 10.9* 8.9* 9.3*  HCT 32.5* 27.0* 28.6*  PLT 142* 122* 107*  CREATININE 0.71  --  0.71  MG  --  1.4* 2.3   Estimated Creatinine Clearance: 80.5 mL/min (by C-G formula based on SCr of 0.71 mg/dL).   Calcium (mg/dL)  Date Value  12/04/2016 7.9 (L)   Calcium, Total (mg/dL)  Date Value  07/20/2014 9.1   Potassium (mmol/L)  Date Value  12/04/2016 3.9  07/20/2014 3.0 (L)       Medical History: Past Medical History:  Diagnosis Date  . Anemia   . BPH without obstruction/lower urinary tract symptoms   . Cellulitis of scrotum   . Cyst of prostate 05/13/2015  . Diabetes ([place])   . Difficulty urinating   . Epididymoorchitis   . ETOH abuse   . Fracture of right tibia and fibula   . GERD (gastroesophageal reflux disease)   . GI bleed   . Pancreatitis   . Trichomoniasis     Assessment: 56 y/o M with a h/o pancreatitis admitted with diarrhea and syncope.   Plan:  Potassium replaced with 40 meq po once per MD. Will order magnesium sulfate 4 g iv once and f/u PM K.   Addendum: Electrolytes are WNL. Will f/u labs in 48 hours.   Ulice Dash D 12/04/2016,10:03 AM

## 2016-12-04 NOTE — Evaluation (Signed)
Physical Therapy Evaluation Patient Details Name: [name] MRN: [id number] DOB: [birth year] Today's Date: 12/04/2016   History of Present Illness  presented to ER and admitted secondary to weakness, chronic diarrhea and recurrent syncopal episodes, likely related to dehydration, orthostatic hypotension.  Clinical Impression  Upon evaluation, patient alert and oriented; follows commands.  Bilat UE/LE strength and ROM grossly symmetrical and WFL; functional for basic transfers and mobility.  Vitals stable and WFL with transition to upright; no signs/symptoms of orthostasis.  Able to complete bed mobility with mod indep; sit/stand, basic transfers and gait (200') with IV pole, cga/close sup. Mildly antalgic due to previous R LE/ankle injury, but patient reports performance is baseline for him.  Will see for additional visit to assess with [facility] (patient declined additional ambulation this AM) and stairs as needed; anticipate no PT needs upon discharge.    Follow Up Recommendations No PT follow up (will maintain on caseload thorughout remaining hospitalization; anticipate no formal PT needs upon discharge)    Equipment Recommendations   (has [facility])    Recommendations for Other Services       Precautions / Restrictions Precautions Precautions: Fall Precaution Comments: contact iso Restrictions Weight Bearing Restrictions: No      Mobility  Bed Mobility Overal bed mobility: Modified Independent                Transfers Overall transfer level: Needs assistance   Transfers: Sit to/from Stand Sit to Stand: Supervision;Min guard            Ambulation/Gait Ambulation/Gait assistance: Min guard;Supervision Ambulation Distance (Feet): 200 Feet Assistive device:  (holding IV pole)       General Gait Details: mildly antalgic with decreased R heel strike/toe off; fair cadence, no buckling or LOB.  Reports gait performance is baseline for him.  Stairs             Wheelchair Mobility    Modified Rankin (Stroke Patients Only)       Balance Overall balance assessment: Needs assistance Sitting-balance support: No upper extremity supported;Feet supported Sitting balance-Leahy Scale: Good     Standing balance support: No upper extremity supported Standing balance-Leahy Scale: Fair                               Pertinent Vitals/Pain Pain Assessment: Faces Faces Pain Scale: Hurts little more Pain Location: L flank Pain Descriptors / Indicators: Guarding;Aching;Grimacing Pain Intervention(s): Limited activity within patient's tolerance;Monitored during session;Repositioned;RN gave pain meds during session    [place] expects to be discharged to:: Private residence Living Arrangements: Other relatives (mother) Available Help at Discharge: Family Type of Home: House Home Access: Stairs to enter Entrance Stairs-Rails: Can reach both Entrance Stairs-Number of Steps: 4 Home Layout: One level Home Equipment: Cane - single point      Prior Function Level of Independence: Independent with assistive device(s)         Comments: Mod indep with SPC for household mobility; has aide 7 days/week, 2 hours/day to assist with ADLs and household activities.  Does endorse frequent fall history.     Hand Dominance        Extremity/Trunk Assessment   Upper Extremity Assessment Upper Extremity Assessment: Overall WFL for tasks assessed    Lower Extremity Assessment Lower Extremity Assessment: Overall WFL for tasks assessed (R ankle with limited ROM (DF to neutral) due to history of previous fracture and repair; reports baseline paresthesia plantar  surface bilat feet)       Communication   Communication: No difficulties (slightly mumbled at times)  Cognition Arousal/Alertness: Awake/alert Behavior During Therapy: WFL for tasks assessed/performed Overall Cognitive Status: Within Functional Limits for tasks  assessed                                        General Comments      Exercises     Assessment/Plan    PT Assessment Patient needs continued PT services  PT Problem List Decreased balance;Decreased mobility       PT Treatment Interventions DME instruction;Gait training;Stair training;Functional mobility training;Therapeutic activities;Balance training;Therapeutic exercise;Patient/family education    PT Goals (Current goals can be found in the Care Plan section)  Acute Rehab PT Goals Patient Stated Goal: to go home PT Goal Formulation: With patient Time For Goal Achievement: 12/18/16 Potential to Achieve Goals: Good    Frequency Min 2X/week   Barriers to discharge        Co-evaluation               AM-PAC PT "6 Clicks" Daily Activity  Outcome Measure Difficulty turning over in bed (including adjusting bedclothes, sheets and blankets)?: None Difficulty moving from lying on back to sitting on the side of the bed? : None Difficulty sitting down on and standing up from a chair with arms (e.g., wheelchair, bedside commode, etc,.)?: A Little Help needed moving to and from a bed to chair (including a wheelchair)?: A Little Help needed walking in hospital room?: A Little Help needed climbing 3-5 steps with a railing? : A Little 6 Click Score: 20    End of Session Equipment Utilized During Treatment: Gait belt Activity Tolerance: Patient tolerated treatment well Patient left: in chair;with call bell/phone within reach;with chair alarm set   PT Visit Diagnosis: Difficulty in walking, not elsewhere classified (R26.2)    Time: 0932-6712 PT Time Calculation (min) (ACUTE ONLY): 28 min   Charges:   PT Evaluation $PT Eval Low Complexity: 1 Procedure     PT G Codes:        Audreana Hancox H. Owens Shark, PT, DPT, NCS 12/04/16, 11:50 AM [phone]

## 2016-12-09 ENCOUNTER — Encounter: Payer: Self-pay | Admitting: Gastroenterology

## 2016-12-09 ENCOUNTER — Ambulatory Visit (INDEPENDENT_AMBULATORY_CARE_PROVIDER_SITE_OTHER): Payer: Medicaid Other | Admitting: Gastroenterology

## 2016-12-09 VITALS — BP 93/58 | HR 91 | Temp 98.1°F | Ht 68.0 in | Wt 131.2 lb

## 2016-12-09 DIAGNOSIS — C187 Malignant neoplasm of sigmoid colon: Secondary | ICD-10-CM

## 2016-12-09 NOTE — Progress Notes (Signed)
Primary Care Physician: [name], MD  Primary Gastroenterologist:  Dr. Lucilla Lame  Chief Complaint  Patient presents with  . Follow up procedure results    Colonoscopy/EGD on 12/02/16    HPI: [name] is a 56 y.o. male here follow-up after having a colonoscopy and EGD for diarrhea and anemia.  The patient was found to have a lesion in his stomach that biopsy showed not to be cancer.  The patient did have a sigmoid mass and a large rectal polyp.  The rectal polyp was removed but was reported to have some adenomatous changes around the base.  The patient also was noted to have some high-grade dysplasia in the rectal polyp and some tissue consistent with adenocarcinoma that may have been contamination from the sigmoid lesion.  Current Outpatient Prescriptions  Medication Sig Dispense Refill  . B-D UF III MINI PEN NEEDLES 31G X 5 MM MISC   1  . CREON 36000 units CPEP capsule Take 36,000 Units by mouth 5 (five) times daily as needed. Take 36,000 Units by mouth 3 times daily with meals and twice daily with snacks.  0  . fluticasone (FLONASE) 50 MCG/ACT nasal spray Place 2 sprays into both nostrils daily.  0  . LANTUS SOLOSTAR 100 UNIT/ML Solostar Pen Inject 10 Units into the skin 2 (two) times daily. 15 mL 1  . lisinopril (PRINIVIL,ZESTRIL) 10 MG tablet Take 10 mg by mouth daily.    Marland Kitchen loperamide (IMODIUM A-D) 2 MG tablet Take 1 tablet (2 mg total) by mouth 4 (four) times daily as needed for diarrhea or loose stools. 20 tablet 0  . NOVOLOG FLEXPEN 100 UNIT/ML FlexPen Inject 48 Units into the skin at bedtime.   1  . pantoprazole (PROTONIX) 40 MG tablet Take by mouth.    . pravastatin (PRAVACHOL) 40 MG tablet take 1 tablet by mouth every evening for cholesterol    . pregabalin (LYRICA) 50 MG capsule Take 50 mg by mouth.    . ciprofloxacin (CIPRO) 500 MG tablet Take 1 tablet (500 mg total) by mouth 2 (two) times daily. (Patient not taking: Reported on 12/02/2016) 14 tablet 0  .  Oxycodone HCl 10 MG TABS Take by mouth.    . traZODone (DESYREL) 50 MG tablet Take 50 mg by mouth at bedtime.  0   No current facility-administered medications for this visit.     Allergies as of 12/09/2016 - Review Complete 12/09/2016  Allergen Reaction Noted  . Aspirin Itching and Other (See Comments) 08/11/2016    ROS:  General: Negative for anorexia, weight loss, fever, chills, fatigue, weakness. ENT: Negative for hoarseness, difficulty swallowing , nasal congestion. CV: Negative for chest pain, angina, palpitations, dyspnea on exertion, peripheral edema.  Respiratory: Negative for dyspnea at rest, dyspnea on exertion, cough, sputum, wheezing.  GI: See history of present illness. GU:  Negative for dysuria, hematuria, urinary incontinence, urinary frequency, nocturnal urination.  Endo: Negative for unusual weight change.    Physical Examination:   BP (!) 93/58   Pulse 91   Temp 98.1 F (36.7 C) (Oral)   Ht 5\' 8"  (1.727 m)   Wt 131 lb 3.2 oz (59.5 kg)   BMI 19.95 kg/m   General: Well-nourished, well-developed in no acute distress.  Eyes: No icterus. Conjunctivae pink. Mouth: Oropharyngeal mucosa moist and pink , no lesions erythema or exudate. Lungs: Clear to auscultation bilaterally. Non-labored. Heart: Regular rate and rhythm, no murmurs rubs or gallops.  Abdomen: Bowel sounds are  normal, nontender, nondistended, no hepatosplenomegaly or masses, no abdominal bruits or hernia , no rebound or guarding.   Extremities: No lower extremity edema. No clubbing or deformities. Neuro: Alert and oriented x 3.  Grossly intact. Skin: Warm and dry, no jaundice.   Psych: Alert and cooperative, normal mood and affect.  Labs:    Imaging Studies: Dg Chest 2 View  Result Date: 11/30/2016 CLINICAL DATA:  Weakness EXAM: CHEST  2 VIEW COMPARISON:  08/16/2016 FINDINGS: Cardiac shadow is within normal limits. The lungs are well aerated bilaterally. Old rib fractures are seen bilaterally  with healing. No acute abnormality is noted. IMPRESSION: No acute abnormality noted. Electronically Signed   By: Inez Catalina M.D.   On: 11/30/2016 23:03   Ct Head Wo Contrast  Result Date: 11/30/2016 CLINICAL DATA:  Recurrent syncope, hypotension and weakness for several days. Nausea and emesis today. EXAM: CT HEAD WITHOUT CONTRAST TECHNIQUE: Contiguous axial images were obtained from the base of the skull through the vertex without intravenous contrast. COMPARISON:  08/16/2016 head CT FINDINGS: Brain: Chronic stable small vessel ischemic changes of periventricular white matter. No large vascular territory infarction. Idiopathic basal ganglial calcifications are noted bilaterally left greater than right. No acute intracranial hemorrhage, midline shift or edema. No extra-axial fluid collections. No hydrocephalus. Basal cisterns and fourth ventricle are midline. Vascular: No hyperdense vessels or unexpected calcifications. Skull: No primary bone lesion.  No skull fracture. Sinuses/Orbits: Intact orbits and globes. No acute abnormality of the visualized paranasal sinuses and mastoids. Other: None IMPRESSION: Chronic stable small vessel ischemic disease of periventricular white matter. No acute intracranial abnormality. Electronically Signed   By: Ashley Royalty M.D.   On: 11/30/2016 22:51   Ct Abdomen Pelvis W Contrast  Result Date: 11/30/2016 CLINICAL DATA:  Chronic diarrhea and bloody stools EXAM: CT ABDOMEN AND PELVIS WITH CONTRAST TECHNIQUE: Multidetector CT imaging of the abdomen and pelvis was performed using the standard protocol following bolus administration of intravenous contrast. CONTRAST:  36mL ISOVUE-300 IOPAMIDOL (ISOVUE-300) INJECTION 61% COMPARISON:  04/04/2016 FINDINGS: Lower chest: Lung bases are free of acute infiltrate or sizable effusion. Hepatobiliary: Hyperenhancing lesion is again seen in the posterior aspect of the right lobe consistent with hemangioma. This is stable from the prior exam.  No gallstones, gallbladder wall thickening, or biliary dilatation. Pancreas: Diffuse pancreatic calcifications are noted consistent with chronic pancreatitis. No acute inflammatory changes are seen. Spleen: Normal in size without focal abnormality. Adrenals/Urinary Tract: The adrenal glands are within normal limits. Kidneys demonstrate scattered hypodensities likely related to cysts. No renal calculi or obstructive changes are seen. The bladder is well distended. Stomach/Bowel: Stomach is within normal limits. Appendix appears normal. No evidence of bowel wall thickening, distention, or inflammatory changes. Postsurgical changes are noted in the small bowel in the left mid abdomen. Vascular/Lymphatic: Aortic atherosclerosis. Few small gastrohepatic ligament lymph nodes are noted. The largest of these measures 11 mm in short axis best seen on image number 22 of series 2. This is slightly larger than that seen on the prior exam. Reproductive: Prostate again demonstrates a 2.1 cm cyst similar to that seen on the prior exam. Other: No abdominal wall hernia or abnormality. No abdominopelvic ascites. Musculoskeletal: Healed left rib fracture is noted. Degenerative changes of the lumbar spine are noted. IMPRESSION: Stable chronic changes as described above. Small gastrohepatic ligament lymph nodes slightly more prominent than that seen on the prior exam likely of a reactive nature. Electronically Signed   By: Inez Catalina M.D.   On:  11/30/2016 23:00    Assessment and Plan:   [name] is a 56 y.o. 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  The patient has been told the findings and has an appointment with surgery this week. Clinical judgment by surgery may require reinspection of the rectal lesion with biopsies of the ulcer base to see if there are any signs  of invasive cancer.  The patient has been told the pathology results and to follow up with surgery.    Lucilla Lame, MD. Marval Regal   Note: This dictation was prepared with Dragon dictation along with smaller phrase technology. Any transcriptional errors that result from this process are unintentional.

## 2016-12-11 ENCOUNTER — Ambulatory Visit: Admission: RE | Admit: 2016-12-11 | Payer: Medicaid Other | Source: Ambulatory Visit

## 2016-12-11 ENCOUNTER — Ambulatory Visit (INDEPENDENT_AMBULATORY_CARE_PROVIDER_SITE_OTHER): Payer: Medicaid Other | Admitting: Surgery

## 2016-12-11 ENCOUNTER — Telehealth: Payer: Self-pay

## 2016-12-11 ENCOUNTER — Ambulatory Visit: Payer: Medicaid Other | Admitting: Surgery

## 2016-12-11 ENCOUNTER — Encounter: Payer: Self-pay | Admitting: Surgery

## 2016-12-11 VITALS — BP 101/67 | HR 90 | Temp 98.0°F | Ht 68.0 in | Wt 130.4 lb

## 2016-12-11 DIAGNOSIS — K621 Rectal polyp: Secondary | ICD-10-CM | POA: Diagnosis not present

## 2016-12-11 DIAGNOSIS — M79605 Pain in left leg: Secondary | ICD-10-CM | POA: Diagnosis not present

## 2016-12-11 DIAGNOSIS — C187 Malignant neoplasm of sigmoid colon: Secondary | ICD-10-CM

## 2016-12-11 DIAGNOSIS — M79604 Pain in right leg: Secondary | ICD-10-CM

## 2016-12-11 NOTE — Progress Notes (Signed)
Surgical Clinic Progress/Follow-up Note   HPI:  56 y.o. Male presents to clinic for follow-up discussion regarding sigmoid colonic mass s/p colonoscopic biopsies, first diagnosed after patient recently presented for syncope to [facility] with chronic diarrhea, blood per rectum, and 30 lbs weight loss over the past 6 months. While awaiting pathology, patient was discharged home 7 days ago and returns to discuss further management options. Patient reports persistent diarrhea despite Imodium he says was prescribed by Dr. Allen Norris 2 days ago and a syncopal episode 4 days ago with Right >> Left severe lower extremity swelling and pain x 5 days that he says began after he was discharged home. He also reports chronic SOB that he describes has not changed over the past 8 months since it started at that time, and he denies N/V, fever/chills, or CP. He states he saw his PMD yesterday, but denies instructions regarding his lower extremity swelling.  Review of Systems:  Constitutional: denies any other weight loss, fever, chills, or sweats  Eyes: denies any other vision changes, history of eye injury  ENT: denies sore throat, hearing problems  Respiratory: denies shortness of breath, wheezing  Cardiovascular: RLE >> LLE swelling as per HPI; denies CP or palpitations Gastrointestinal: abdominal pain, N/V, and bowel function as per HPI Musculoskeletal: denies any other joint pains or cramps  Skin: Denies any other rashes or skin discolorations  Neurological: syncope as per HPI; denies any other headache, dizziness, or weakness Psychiatric: denies any other depression, anxiety  All other review of systems: otherwise negative   Vital Signs:  BP 101/67   Pulse 90   Temp 98 F (36.7 C) (Oral)   Ht 5\' 8"  (1.727 m)   Wt 130 lb 6.4 oz (59.1 kg)   BMI 19.83 kg/m    Physical Exam:  Constitutional:  -- Very thin body habitus  -- Awake, alert, and oriented x3  Eyes:  -- Pupils equally round and reactive to light   -- No scleral icterus  Ear, nose, throat:  -- No jugular venous distension  -- No nasal drainage, bleeding Pulmonary:  -- No crackles  -- No dullness to percussion  Cardiovascular:  -- S1, S2 present  -- No pericardial rubs  Gastrointestinal:  -- Soft, nontender, nondistended, no guarding/rebound  -- No abdominal masses appreciated, pulsatile or otherwise  Musculoskeletal / Integumentary:  -- Wounds or skin discoloration: small Right anterior calf wound overlying 2+ pitting edema  -- Extremities: 2+ Right leg >> 1+ Left leg pitting edema and mild-/moderate- calf tenderness to palpation -- B/L UE and LE FROM, hands and feet warm Neurologic:  -- Motor function: intact and symmetric  -- Sensation: intact and symmetric   Pathology: Endoscopic Gastric, Colonic, and Rectal Biopsies (12/02/2016) A. STOMACH; COLD BIOPSY:  - MODERATE FOVEOLAR HYPERPLASIA AND VASCULAR CONGESTION.  - SUPERFICIAL SAMPLE.  - MALIGNANCY IS NOT SEEN IN THIS MATERIAL.   B. COLON POLYP 3, ASCENDING; HOT SNARE:  - TUBULAR ADENOMAS (3).  - NEGATIVE FOR HIGH-GRADE DYSPLASIA AND MALIGNANCY.   C. COLON POLYP 2, TRANSVERSE; HOT SNARE:  - TUBULAR ADENOMAS (2).  - NEGATIVE FOR HIGH-GRADE DYSPLASIA AND MALIGNANCY.   D. SIGMOID COLON MASS; COLD BIOPSY:  - INVASIVE ADENOCARCINOMA, SEE NOTE.   E. RECTAL POLYP; HOT SNARE:  - SMALL FRAGMENTS OF ADENOCARCINOMA, SEE COMMENT.  - TUBULOVILLOUS ADENOMA WITH HIGH-GRADE DYSPLASIA.  - ADENOMATOUS CHANGE IS SEEN AT THE CAUTERIZED STALK.   Note: There are small fragments of at least intramucosal adenocarcinoma  in the rectal biopsy.  The morphology is similar to that of the sigmoid  colon mass. Clinical correlation is necessary regarding if this  represents contamination from the sigmoid mass or a second site of  malignancy within the rectum. The findings in the sigmoid and rectum  were discussed with Dr. Allen Norris at approximately 3:30 pm on 12/04/16.  Assessment:  56 y.o.  yo Male with a problem list including...  Patient Active Problem List   Diagnosis Date Noted  . Shock ([place]) 12/02/2016  . Acute posthemorrhagic anemia   . Benign neoplasm of ascending colon   . Rectal polyp   . Noninfectious diarrhea   . Neoplasm of unspecified behavior of digestive system   . Syncope 12/01/2016  . Chronic diarrhea 12/01/2016  . Pancreatitis 04/17/2016  . Noncompliance 07/05/2015  . Tobacco dependence 07/05/2015  . Fracture of right tibia and fibula 06/05/2015  . Type 2 diabetes mellitus with hyperglycemia ([place]) 06/05/2015  . Prostatic cyst 05/13/2015  . GI bleed 05/12/2015  . Type 2 diabetes mellitus ([place]) 05/12/2015  . GERD (gastroesophageal reflux disease) 05/12/2015  . ETOH abuse 05/12/2015  . BPH (benign prostatic hyperplasia) 05/12/2015    presents to clinic for follow-up discussion regarding colon and rectal endoscopic biopsies with pathology suggesting sigmoid colonic adenocarcinoma and rectal fragments of at least intramucosal adenocarcinoma vs contamination of rectal specimen from sigmoid specimen, complicated by RLE >> LLE asymmetric pitting edema and calf pain concerning for DVT in the context of newly diagnosed malignancy with chronic diarrhea and likely hypovolemia with multiple syncopal episodes.  Plan:   - stat B/L lower extremity venous duplex u/s encouraged  - patient refused venous duplex ultrasound today due to uncle's funeral, agreed will have done tomorrow at 9 am  - return to Dr. Allen Norris for diarrhea and re-biopsy of rectal polyp to distinguish sigmoid adeno CA vs sigmoid + rectal CA  - return to clinic following pathology available from rectal rebiopsy, instructed to call office if questions or concerns  - importance of alcohol and smoking cessation reiterated for both long-term and peri-operative benefits  All of the above recommendations were discussed with the patient, and all of patient's questions were answered to his expressed  satisfaction.  -- Marilynne Drivers Rosana Hoes, MD, [place]: [facility] - Partnering for exceptional care. Office: [phone]

## 2016-12-11 NOTE — Telephone Encounter (Signed)
Patient was strongly encouraged to have STAT Ultrasound Bilateral Lower Extremity for Leg swelling and pain while being seen in office today.  Ultrasound appointment is made for 3:45 today, however patient has changed the appointment to 9:00 am 6/15 @ [place]. Patient stated he has his uncle's funeral to attend today and will not go for the Ultrasound today.

## 2016-12-11 NOTE — Patient Instructions (Signed)
We strongly encourage you to have this STAT Ultrasound done prior to leaving the office today, however we understand you have a funeral to go to. We have an appointment @ 3:45 today at [facility]. Please go in to the [place] entrance and check in at the registration desk.    Someone will call you from Dr.Wohl's office to schedule an appointment for your Biopsy. wE will need to see you after the biopsy in our office to arrange your surgery. We will call and schedule an appointment.  Please increase Fiber in your diet to help with the Diarrhea and be sure to stay Hydrated. Please try and drink 72 ounces of water daily.  High-Fiber Diet Fiber, also called dietary fiber, is a type of carbohydrate found in fruits, vegetables, whole grains, and beans. A high-fiber diet can have many health benefits. Your health care provider may recommend a high-fiber diet to help:  Prevent constipation. Fiber can make your bowel movements more regular.  Lower your cholesterol.  Relieve hemorrhoids, uncomplicated diverticulosis, or irritable bowel syndrome.  Prevent overeating as part of a weight-loss plan.  Prevent heart disease, type 2 diabetes, and certain cancers.  What is my plan? The recommended daily intake of fiber includes:  38 grams for men under age 37.  58 grams for men over age 56.  26 grams for women under age 60.  54 grams for women over age 45.  You can get the recommended daily intake of dietary fiber by eating a variety of fruits, vegetables, grains, and beans. Your health care provider may also recommend a fiber supplement if it is not possible to get enough fiber through your diet. What do I need to know about a high-fiber diet?  Fiber supplements have not been widely studied for their effectiveness, so it is better to get fiber through food sources.  Always check the fiber content on thenutrition facts label of any prepackaged food. Look for foods that contain at  least 5 grams of fiber per serving.  Ask your dietitian if you have questions about specific foods that are related to your condition, especially if those foods are not listed in the following section.  Increase your daily fiber consumption gradually. Increasing your intake of dietary fiber too quickly may cause bloating, cramping, or gas.  Drink plenty of water. Water helps you to digest fiber. What foods can I eat? Grains Whole-grain breads. Multigrain cereal. Oats and oatmeal. Brown rice. Barley. Bulgur wheat. [place]. Bran muffins. Popcorn. Rye wafer crackers. Vegetables Sweet potatoes. Spinach. Kale. Artichokes. Cabbage. Broccoli. Green peas. Carrots. Squash. Fruits Berries. Pears. Apples. Oranges. Avocados. Prunes and raisins. Dried figs. Meats and Other Protein Sources [place], kidney, pinto, and soy beans. Split peas. Lentils. Nuts and seeds. Dairy Fiber-fortified yogurt. Beverages Fiber-fortified soy milk. Fiber-fortified orange juice. Other Fiber bars. The items listed above may not be a complete list of recommended foods or beverages. Contact your dietitian for more options. What foods are not recommended? Grains White bread. Pasta made with refined flour. White rice. Vegetables Fried potatoes. Canned vegetables. Well-cooked vegetables. Fruits Fruit juice. Cooked, strained fruit. Meats and Other Protein Sources Fatty cuts of meat. Fried Sales executive or fried fish. Dairy Milk. Yogurt. Cream cheese. Sour cream. Beverages Soft drinks. Other Cakes and pastries. Butter and oils. The items listed above may not be a complete list of foods and beverages to avoid. Contact your dietitian for more information. What are some tips for including high-fiber foods in my diet?  Eat  a wide variety of high-fiber foods.  Make sure that half of all grains consumed each day are whole grains.  Replace breads and cereals made from refined flour or white flour with whole-grain breads and  cereals.  Replace white rice with brown rice, bulgur wheat, or millet.  Start the day with a breakfast that is high in fiber, such as a cereal that contains at least 5 grams of fiber per serving.  Use beans in place of meat in soups, salads, or pasta.  Eat high-fiber snacks, such as berries, raw vegetables, nuts, or popcorn. This information is not intended to replace advice given to you by your health care provider. Make sure you discuss any questions you have with your health care provider. Document Released: 06/16/2005 Document Revised: 11/22/2015 Document Reviewed: 11/29/2013 Elsevier Interactive Patient Education  2017 [employer].

## 2016-12-12 ENCOUNTER — Telehealth: Payer: Self-pay

## 2016-12-12 ENCOUNTER — Ambulatory Visit: Admission: RE | Admit: 2016-12-12 | Payer: Medicaid Other | Source: Ambulatory Visit

## 2016-12-12 NOTE — Telephone Encounter (Signed)
Kim from Ultrasound called stating that the patient did not show up to his appointment this morning. However, she tried calling the patient and he did not answer.  Maudie Mercury stated that if we could get in contact with the patient today, to give them a call so we could add him in the schedule for today.

## 2016-12-12 NOTE — Telephone Encounter (Signed)
Left message at this time on patients phone to return call regarding Ultrasound.

## 2016-12-12 NOTE — Telephone Encounter (Signed)
[name] patient and had to leave him a detailed message stating to call [phone] to reschedule his Ultrasound and to let us know when he rescheduled it to.

## 2016-12-12 NOTE — Telephone Encounter (Signed)
Spoke with Ginger Feldpaush at this time regarding Rectal Biopsy. She stated she would call patient and schedule a sigmoidoscopy per Dr.Wohl and let me know when this has been done so that I can make a follow up appointment with Dr.Davis.

## 2016-12-15 ENCOUNTER — Telehealth: Payer: Self-pay

## 2016-12-15 NOTE — Telephone Encounter (Signed)
Called patient and had to leave him a voicemail again. He has not returned my call.

## 2016-12-15 NOTE — Telephone Encounter (Signed)
Left vm for pt to call and schedule flex sigmoidoscopy.

## 2016-12-17 NOTE — Telephone Encounter (Signed)
Left vm again for pt to return my call.  

## 2016-12-19 ENCOUNTER — Telehealth: Payer: Self-pay

## 2016-12-19 NOTE — Telephone Encounter (Signed)
Patient has failed to return calls to [facility] at this time.   Mailed  letter to patient asking that he call office and schedule an appointment.

## 2016-12-24 ENCOUNTER — Other Ambulatory Visit: Payer: Self-pay

## 2016-12-24 ENCOUNTER — Encounter: Payer: Self-pay | Admitting: Anesthesiology

## 2016-12-24 ENCOUNTER — Encounter: Payer: Self-pay | Admitting: *Deleted

## 2016-12-24 DIAGNOSIS — K6289 Other specified diseases of anus and rectum: Secondary | ICD-10-CM

## 2016-12-26 ENCOUNTER — Telehealth: Payer: Self-pay | Admitting: Gastroenterology

## 2016-12-26 NOTE — Telephone Encounter (Signed)
Advised pt I cannot call anything in for him as Dr. Allen Norris is out of the office until Tuesday. He requested something for diarrhea. Advised him to take OTC imodium. He stated this did not work. He says he will wait until Tuesday.   Dr. Allen Norris, please advise.

## 2016-12-26 NOTE — Telephone Encounter (Signed)
Patient also needs something for stomach pain [facility] [address]

## 2016-12-30 NOTE — Telephone Encounter (Signed)
This patient has been found to have colon cancer which may be the cause of his diarrhea.  The patient was supposed to be set up for a repeat flexible sigmoidoscopy to assess his rectal lesion prior to having surgery.  If the Imodium is not working then he can be started on Lomotil.  Otherwise he needs to have his colon cancer removed after his flexible sigmoidoscopy.

## 2017-01-01 ENCOUNTER — Other Ambulatory Visit: Payer: Self-pay

## 2017-01-01 MED ORDER — DIPHENOXYLATE-ATROPINE 2.5-0.025 MG PO TABS: 1.0000 | ORAL_TABLET | Freq: Four times a day (QID) | ORAL | 1 refills | Status: DC | PRN

## 2017-01-01 NOTE — Telephone Encounter (Signed)
Spoke with pt and he would like the rx for Lomotil to be called into his pharmacy. Pt is also scheduled for the flex sigmoidoscopy at [facility] on Monday, July 9th.

## 2017-01-02 NOTE — Discharge Instructions (Signed)
General Anesthesia, Adult, Care After °These instructions provide you with information about caring for yourself after your procedure. Your health care provider may also give you more specific instructions. Your treatment has been planned according to current medical practices, but problems sometimes occur. Call your health care provider if you have any problems or questions after your procedure. °What can I expect after the procedure? °After the procedure, it is common to have: °· Vomiting. °· A sore throat. °· Mental slowness. ° °It is common to feel: °· Nauseous. °· Cold or shivery. °· Sleepy. °· Tired. °· Sore or achy, even in parts of your body where you did not have surgery. ° °Follow these instructions at home: °For at least 24 hours after the procedure: °· Do not: °? Participate in activities where you could fall or become injured. °? Drive. °? Use heavy machinery. °? Drink alcohol. °? Take sleeping pills or medicines that cause drowsiness. °? Make important decisions or sign legal documents. °? Take care of children on your own. °· Rest. °Eating and drinking °· If you vomit, drink water, juice, or soup when you can drink without vomiting. °· Drink enough fluid to keep your urine clear or pale yellow. °· Make sure you have little or no nausea before eating solid foods. °· Follow the diet recommended by your health care provider. °General instructions °· Have a responsible adult stay with you until you are awake and alert. °· Return to your normal activities as told by your health care provider. Ask your health care provider what activities are safe for you. °· Take over-the-counter and prescription medicines only as told by your health care provider. °· If you smoke, do not smoke without supervision. °· Keep all follow-up visits as told by your health care provider. This is important. °Contact a health care provider if: °· You continue to have nausea or vomiting at home, and medicines are not helpful. °· You  cannot drink fluids or start eating again. °· You cannot urinate after 8-12 hours. °· You develop a skin rash. °· You have fever. °· You have increasing redness at the site of your procedure. °Get help right away if: °· You have difficulty breathing. °· You have chest pain. °· You have unexpected bleeding. °· You feel that you are having a life-threatening or urgent problem. °This information is not intended to replace advice given to you by your health care provider. Make sure you discuss any questions you have with your health care provider. °Document Released: 09/22/2000 Document Revised: 11/19/2015 Document Reviewed: 05/31/2015 °Elsevier Interactive Patient Education © 2018 [employer] Inc. ° °

## 2017-01-05 ENCOUNTER — Telehealth: Payer: Self-pay

## 2017-01-05 ENCOUNTER — Ambulatory Visit: Admission: RE | Admit: 2017-01-05 | Payer: Medicaid Other | Source: Ambulatory Visit | Admitting: Gastroenterology

## 2017-01-05 HISTORY — DX: Dyspnea, unspecified: R06.00

## 2017-01-05 HISTORY — DX: Presence of dental prosthetic device (complete) (partial): Z97.2

## 2017-01-05 SURGERY — SIGMOIDOSCOPY, FLEXIBLE
Anesthesia: Choice

## 2017-01-05 NOTE — Telephone Encounter (Signed)
Ginger called stating that the patient did not show up to his appointment this AM to have his Flexible Sigmoidoscopy.

## 2017-01-05 NOTE — Telephone Encounter (Signed)
Pt was scheduled for a flex sigmoidoscopy today (01/05/17). Received call from [place] at [facility] to notify me he was a no show this morning. They have contacted pt to see if he was coming but he did not answer.

## 2017-01-12 NOTE — Telephone Encounter (Signed)
Tried contacting pt again today to reschedule flex sigmoidoscopy. Left vm for him to return my call. Mailed pt a letter requesting a call back to reschedule.

## 2017-01-13 ENCOUNTER — Other Ambulatory Visit: Payer: Self-pay

## 2017-01-14 ENCOUNTER — Ambulatory Visit (INDEPENDENT_AMBULATORY_CARE_PROVIDER_SITE_OTHER): Payer: Medicaid Other | Admitting: Surgery

## 2017-01-14 ENCOUNTER — Encounter: Payer: Self-pay | Admitting: Surgery

## 2017-01-14 VITALS — BP 107/74 | HR 78 | Temp 97.8°F | Resp 22 | Ht 68.0 in | Wt 130.0 lb

## 2017-01-14 DIAGNOSIS — C185 Malignant neoplasm of splenic flexure: Secondary | ICD-10-CM

## 2017-01-14 NOTE — Progress Notes (Signed)
Surgical Clinic Progress/Follow-up Note  HPI:  56 y.o. 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 Patient reports he has lost an additional 15 lbs over the past 6 weeks (beyond prior 30 lbs over 6 months) with persistent diarrhea and denies any further alcohol consumption, but patient's previous denials have been wholly refuted by his long-term girlfriend, who's previously stated he continues to drink 5 - 6 beers per day and more excessively on weekends. Patient also requests narcotic pain medication for LLQ abdominal pain and medication for his diarrhea despite having been told many times it's likely due to his cancer. Patient otherwise denies any fever/chills, CP, or any worsening of his chronic SOB.  Review of Systems:  Constitutional: denies any other weight loss, fever, chills, or sweats  Eyes: denies any other vision changes, history of eye injury  ENT: denies sore throat, hearing problems  Respiratory: denies shortness of breath, wheezing  Cardiovascular: denies chest pain, palpitations  Gastrointestinal: abdominal pain, N/V, and bowel function as per HPI Musculoskeletal: denies any other joint pains or cramps  Skin: Denies any other rashes or skin discolorations  Neurological: denies any other headache, dizziness, weakness  Psychiatric: denies any other depression, anxiety  All other review of systems: otherwise negative   Vital Signs:  BP 107/74   Pulse 78 Comment: Regular  Temp 97.8 F (36.6 C) (Oral)   Resp (!) 22   Ht 5\' 8"  (1.727 m)   Wt 130 lb (59 kg)   SpO2 100%   BMI 19.77 kg/m    Physical Exam:  Constitutional:  -- Cachectic/emaceated body habitus  -- Awake, alert, and oriented x3   Eyes:  -- Pupils equally round and reactive to light  -- +Obvious scleral icterus  Ear, nose, throat:  -- No jugular venous distension  -- No nasal drainage, bleeding Pulmonary:  -- No crackles -- Equal breath sounds bilaterally -- Breathing non-labored at rest Cardiovascular:  -- S1, S2 present  -- No pericardial rubs  Gastrointestinal:  -- Soft and non-distended will mild LLQ abdominal pain (otherwise non-tender), no guarding/rebound  -- No abdominal masses appreciated, pulsatile or otherwise  Musculoskeletal / Integumentary:  -- Wounds or skin discoloration: None appreciated  -- Extremities: B/L UE and LE FROM, hands and feet warm, no edema (RLE edema resolved), NT  Neurologic:  -- Motor function: intact and symmetric  -- Sensation: intact and symmetric   Assessment:  56 y.o. yo Male with a problem list including...  Patient Active Problem List   Diagnosis Date Noted  . Shock ([place]) 12/02/2016  . Acute posthemorrhagic anemia   . Benign neoplasm of ascending colon   . Colorectal polyps   . Noninfectious diarrhea   . Neoplasm of unspecified behavior of digestive system   . Syncope 12/01/2016  . Chronic diarrhea 12/01/2016  . Pancreatitis 04/17/2016  . Noncompliance 07/05/2015  . Tobacco dependence 07/05/2015  . Fracture of right tibia and fibula 06/05/2015  . Type 2 diabetes mellitus with hyperglycemia ([place]) 06/05/2015  . Prostatic cyst 05/13/2015  . GI bleed 05/12/2015  . Type 2 diabetes mellitus ([place]) 05/12/2015  . GERD (gastroesophageal reflux disease) 05/12/2015  . ETOH abuse 05/12/2015  . BPH (benign prostatic hyperplasia) 05/12/2015    presents to clinic  with severe malnutrition and weight loss, chronic diarrhea, and newly evident B/L scleral icterus for follow-up management of thus far untreated colorectal adenocarcinoma.  Plan:   - importance of re-biopsy of rectal mass reiterated  - follow-up results of flexible sigmoidoscopy with rectal polyp  re-biopsy  - considering new scleral icterus and interval delay since prior CT imaging, will need CBC, CMP, and CT             - return to clinic following pathology available from rectal rebiopsy, instructed to call office if questions or concerns             - importance of alcohol and smoking cessation reiterated for both long-term and peri-operative benefits  - if patient still a candidate for surgery and completes workup, may refer to tertiary center  All of the above recommendations were discussed with the patient, and all of patient's questions were answered to his expressed satisfaction.  -- Marilynne Drivers Rosana Hoes, MD, [place]: [facility] - Partnering for exceptional care. Office: [phone]

## 2017-01-14 NOTE — Patient Instructions (Addendum)
We have scheduled you to have the Flexsigmoidoscopy @ [facility] on 02/03/17. Ginger  Will be  mailing you the instructions sheet. If you do not receive the instruction sheet in 1 week please call Ginger @ [phone].  As soon as we have your Results from this test, we will call you with the next step in this process.

## 2017-01-15 ENCOUNTER — Inpatient Hospital Stay: Payer: Medicaid Other

## 2017-01-15 ENCOUNTER — Encounter: Payer: Self-pay | Admitting: Oncology

## 2017-01-15 ENCOUNTER — Ambulatory Visit
Admission: RE | Admit: 2017-01-15 | Discharge: 2017-01-15 | Disposition: A | Discharge status: Home / Self Care | Payer: Medicaid Other | Source: Ambulatory Visit | Attending: Surgery | Admitting: Surgery

## 2017-01-15 ENCOUNTER — Telehealth: Payer: Self-pay | Admitting: *Deleted

## 2017-01-15 ENCOUNTER — Inpatient Hospital Stay: Payer: Medicaid Other | Attending: Oncology | Admitting: Oncology

## 2017-01-15 VITALS — BP 125/83 | HR 81 | Temp 97.0°F | Wt 115.0 lb

## 2017-01-15 DIAGNOSIS — R7401 Elevation of levels of liver transaminase levels: Secondary | ICD-10-CM

## 2017-01-15 DIAGNOSIS — F17200 Nicotine dependence, unspecified, uncomplicated: Secondary | ICD-10-CM

## 2017-01-15 DIAGNOSIS — F1010 Alcohol abuse, uncomplicated: Secondary | ICD-10-CM

## 2017-01-15 DIAGNOSIS — M79605 Pain in left leg: Secondary | ICD-10-CM | POA: Diagnosis present

## 2017-01-15 DIAGNOSIS — F17210 Nicotine dependence, cigarettes, uncomplicated: Secondary | ICD-10-CM

## 2017-01-15 DIAGNOSIS — C187 Malignant neoplasm of sigmoid colon: Secondary | ICD-10-CM | POA: Diagnosis not present

## 2017-01-15 DIAGNOSIS — E1165 Type 2 diabetes mellitus with hyperglycemia: Secondary | ICD-10-CM | POA: Diagnosis not present

## 2017-01-15 DIAGNOSIS — Z9114 Patient's other noncompliance with medication regimen: Secondary | ICD-10-CM | POA: Insufficient documentation

## 2017-01-15 DIAGNOSIS — Z794 Long term (current) use of insulin: Secondary | ICD-10-CM | POA: Diagnosis not present

## 2017-01-15 DIAGNOSIS — M79604 Pain in right leg: Secondary | ICD-10-CM | POA: Insufficient documentation

## 2017-01-15 DIAGNOSIS — E440 Moderate protein-calorie malnutrition: Secondary | ICD-10-CM | POA: Insufficient documentation

## 2017-01-15 DIAGNOSIS — R740 Nonspecific elevation of levels of transaminase and lactic acid dehydrogenase [LDH]: Secondary | ICD-10-CM | POA: Diagnosis not present

## 2017-01-15 DIAGNOSIS — K529 Noninfective gastroenteritis and colitis, unspecified: Secondary | ICD-10-CM | POA: Insufficient documentation

## 2017-01-15 DIAGNOSIS — R1084 Generalized abdominal pain: Secondary | ICD-10-CM

## 2017-01-15 DIAGNOSIS — K859 Acute pancreatitis without necrosis or infection, unspecified: Secondary | ICD-10-CM | POA: Diagnosis not present

## 2017-01-15 DIAGNOSIS — Z800 Family history of malignant neoplasm of digestive organs: Secondary | ICD-10-CM | POA: Insufficient documentation

## 2017-01-15 DIAGNOSIS — Z79899 Other long term (current) drug therapy: Secondary | ICD-10-CM | POA: Insufficient documentation

## 2017-01-15 DIAGNOSIS — R64 Cachexia: Secondary | ICD-10-CM

## 2017-01-15 DIAGNOSIS — N400 Enlarged prostate without lower urinary tract symptoms: Secondary | ICD-10-CM | POA: Diagnosis not present

## 2017-01-15 DIAGNOSIS — K219 Gastro-esophageal reflux disease without esophagitis: Secondary | ICD-10-CM | POA: Diagnosis not present

## 2017-01-15 DIAGNOSIS — Z91199 Patient's noncompliance with other medical treatment and regimen due to unspecified reason: Secondary | ICD-10-CM

## 2017-01-15 DIAGNOSIS — Z9119 Patient's noncompliance with other medical treatment and regimen: Secondary | ICD-10-CM | POA: Diagnosis not present

## 2017-01-15 LAB — CBC WITH DIFFERENTIAL/PLATELET
BASOS ABS: 0 10*3/uL (ref 0–0.1)
Basophils Relative: 1 %
EOS ABS: 0 10*3/uL (ref 0–0.7)
EOS PCT: 1 %
HCT: 33.3 % — ABNORMAL LOW (ref 40.0–52.0)
HEMOGLOBIN: 11.2 g/dL — AB (ref 13.0–18.0)
LYMPHS ABS: 0.9 10*3/uL — AB (ref 1.0–3.6)
Lymphocytes Relative: 25 %
MCH: 33 pg (ref 26.0–34.0)
MCHC: 33.7 g/dL (ref 32.0–36.0)
MCV: 98 fL (ref 80.0–100.0)
Monocytes Absolute: 0.2 10*3/uL (ref 0.2–1.0)
Monocytes Relative: 6 %
NEUTROS PCT: 67 %
Neutro Abs: 2.4 10*3/uL (ref 1.4–6.5)
PLATELETS: 98 10*3/uL — AB (ref 150–440)
RBC: 3.39 MIL/uL — ABNORMAL LOW (ref 4.40–5.90)
RDW: 14.3 % (ref 11.5–14.5)
WBC: 3.6 10*3/uL — AB (ref 3.8–10.6)

## 2017-01-15 LAB — COMPREHENSIVE METABOLIC PANEL
ALBUMIN: 3.3 g/dL — AB (ref 3.5–5.0)
ALK PHOS: 165 U/L — AB (ref 38–126)
ALT: 74 U/L — AB (ref 17–63)
AST: 107 U/L — AB (ref 15–41)
Anion gap: 6 (ref 5–15)
BUN: 13 mg/dL (ref 6–20)
CALCIUM: 9.1 mg/dL (ref 8.9–10.3)
CO2: 25 mmol/L (ref 22–32)
CREATININE: 1.04 mg/dL (ref 0.61–1.24)
Chloride: 95 mmol/L — ABNORMAL LOW (ref 101–111)
GFR calc Af Amer: >60 mL/min (ref >60)
GFR calc non Af Amer: >60 mL/min (ref >60)
GLUCOSE: 668 mg/dL (ref 65–99)
Potassium: 4.4 mmol/L (ref 3.5–5.1)
SODIUM: 126 mmol/L — AB (ref 135–145)
Total Bilirubin: 2.2 mg/dL — ABNORMAL HIGH (ref 0.3–1.2)
Total Protein: 7.1 g/dL (ref 6.5–8.1)

## 2017-01-15 LAB — PREALBUMIN: Prealbumin: 10.9 mg/dL — ABNORMAL LOW (ref 18–38)

## 2017-01-15 LAB — BILIRUBIN, DIRECT: Bilirubin, Direct: 0.9 mg/dL — ABNORMAL HIGH (ref 0.1–0.5)

## 2017-01-15 LAB — APTT: aPTT: 32 seconds (ref 24–36)

## 2017-01-15 LAB — PROTIME-INR
INR: 1.2
Prothrombin Time: 15.3 seconds — ABNORMAL HIGH (ref 11.4–15.2)

## 2017-01-15 MED ORDER — OXYCODONE HCL 5 MG PO TABS: 5.0000 mg | ORAL_TABLET | ORAL | 0 refills | Status: DC | PRN

## 2017-01-15 NOTE — Progress Notes (Signed)
  Oncology Nurse Navigator Documentation [name] was escorted to U/S by cancer center staff after lab work completed. Per Dr.Yu [name] needs to go the the Ed for low sodium. I met [name] at the completion of his U/S and explained to him the his sodium was low and that he needs to go to the Ed. He refused and stated "he would come back later, he didn't have time right now, he felt fine. Explained that he could go home have confusion, seizure, or loss of consciousness if this is not corrected.. Expressed the importance of this to him again. He continued to refuse. Escorted him back to the cancer center where I again encouraged him to allow [place] to transport him to the Ed. He again refused. Dr. Tasia Catchings was notified. He has agreed to stay in waiting area until his U/S report is back.   Navigator Location: [facility] (01/15/17 1400)   )Navigator Encounter Type: Initial MedOnc;Diagnostic Results (01/15/17 1400)                                                    Time Spent with Patient: 75 (01/15/17 1400)

## 2017-01-15 NOTE — Telephone Encounter (Signed)
Dr Tasia Catchings not MCD appvd yet, need someone else to sign for narcotic. Dr Grayland Ormond signed for it. Patient is to bring back other rx and pick up new one

## 2017-01-15 NOTE — Progress Notes (Addendum)
[facility] Initial Conmsultation  Patient Care Team: [name], MD as PCP - General (Internal Medicine)  CHIEF COMPLAINTS/PURPOSE OF CONSULTATION: Abdominal pain. Recently diagnosed colon cancer  HISTORY OF PRESENTING ILLNESS: [name] 56 y.o. male with PMH listed below is referred by Dr.Davis to me for evaluation of recently diagnosed colorectal cancer. Patient reports that he has been having significant weight loss, abdominal pain, persistent diarrhea for past few months. And more recently he lost 15 pounds in the past 6 weeks.  He had GI work up including EGD and colonoscopy in June 2018 which revealed non obstructing large mass in sigmoid colon, several colon polyps, one rectal polyp and a large sessile mass in cardia.  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 Rectal polyp showed small fragments of adenocarcinoma.  11/30/2016 CT abdomen and pelvis with contrast showed no evidence of metastatic disease.  11/30/2016 CT head showed no acute intracranial abnormality Due to non compliancy, he has not done any advised follow up or work up till now. He was seen by Dr.Davis on 01/15/2017.  Today he reports abdominal pain which he rates 9 out of 10. He requests to get pain medication. He used to follow up with a pain clinic and due to noncompliance with appointment, he was discharged from there and he reports not having any pain medication and has tried tylenol with no relief of his pain.  Patient otherwise denies any fever/chills, CP, or any worsening of his chronic SOB. He denies drinking alcohol, however per previous note, his family members reported that he continues to drink 5-6 beers a day and more excessively on weekends. Today his girlfriend drop him to cancer center and left. Patient has  not get the [place] lower extremity doppler done which was ordered by Dr.Davis yesterday due to the concern of lower extremity tenderness and swelling.   Patient has significant family history including father died at age of 56 with colon cancer and his brother died at age of age of 3 due to colon cancer. Per patient, they did not get genetic testing.   Patient is single, lives with his mother who is 79 years old. He has total 4 children, youngest kid is 64 years old who does not live with him.   Review of Systems  Constitutional: Positive for fatigue.  HENT:  Negative.   Eyes: Negative.   Respiratory: Negative.   Cardiovascular: Positive for leg swelling.  Gastrointestinal: Positive for abdominal pain and diarrhea.  Endocrine: Negative.   Genitourinary: Negative.    Musculoskeletal: Negative.   Skin: Negative.   Neurological: Negative.   Hematological: Negative.   Psychiatric/Behavioral: Negative.     MEDICAL HISTORY: Past Medical History:  Diagnosis Date  . Anemia   . BPH without obstruction/lower urinary tract symptoms   . Cellulitis of scrotum   . Cyst of prostate 05/13/2015  . Diabetes ([place])   . Difficulty urinating   . Dyspnea   . Epididymoorchitis   . ETOH abuse   . Fracture of right tibia and fibula   . GERD (gastroesophageal reflux disease)   . GI bleed   . Pancreatitis   . Trichomoniasis   . Wears dentures    Partial upper and lower.  reports poor fit.    SURGICAL HISTORY: Past Surgical History:  Procedure Laterality Date  .  COLONOSCOPY WITH PROPOFOL N/A 12/02/2016   Procedure: COLONOSCOPY WITH PROPOFOL;  Surgeon: Lucilla Lame, MD;  Location: [facility] ENDOSCOPY;  Service: Endoscopy;  Laterality: N/A;  . ESOPHAGOGASTRODUODENOSCOPY (EGD) WITH PROPOFOL N/A 12/02/2016   Procedure: ESOPHAGOGASTRODUODENOSCOPY (EGD) WITH PROPOFOL;  Surgeon: Lucilla Lame, MD;  Location: [facility] ENDOSCOPY;  Service: Endoscopy;  Laterality: N/A;  . FRACTURE SURGERY     TIBIA AND FIBULA  . FRACTURE  SURGERY    . resection of pancreas    . SCROTAL EXPLORATION      SOCIAL HISTORY: Social History   Social History  . Marital status: Single    Spouse name: N/A  . Number of children: N/A  . Years of education: N/A   Occupational History  . Not on file.   Social History Main Topics  . Smoking status: Current Every Day Smoker    Packs/day: 0.50    Years: 41.00    Types: Cigarettes  . Smokeless tobacco: Never Used     Comment: since age 38  . Alcohol use 0.0 oz/week     Comment: occasionally  . Drug use: No  . Sexual activity: Not on file   Other Topics Concern  . Not on file   Social History Narrative  . No narrative on file    FAMILY HISTORY Family History  Problem Relation Age of Onset  . Cancer - Colon Father   . Colon cancer Brother     ALLERGIES:  is allergic to aspirin.  MEDICATIONS:  Current Outpatient Prescriptions  Medication Sig Dispense Refill  . B-D UF III MINI PEN NEEDLES 31G X 5 MM MISC   1  . LANTUS SOLOSTAR 100 UNIT/ML Solostar Pen Inject 10 Units into the skin 2 (two) times daily. (Patient taking differently: Inject 10 Units into the skin daily at 10 pm. ) 15 mL 1  . NOVOLOG FLEXPEN 100 UNIT/ML FlexPen Inject 48 Units into the skin 3 (three) times daily with meals.   1  . oxyCODONE (OXY IR/ROXICODONE) 5 MG immediate release tablet Take 1 tablet (5 mg total) by mouth every 4 (four) hours as needed for severe pain or breakthrough pain. 30 tablet 0   No current facility-administered medications for this visit.     PHYSICAL EXAMINATION:  ECOG PERFORMANCE STATUS: 1 - Symptomatic but completely ambulatory   Vitals:   01/15/17 1124  BP: 125/83  Pulse: 81  Temp: (!) 97 F (36.1 C)    Filed Weights   01/15/17 1124  Weight: 115 lb (52.2 kg)     Physical Exam GENERAL: mild distress, cachectic  SKIN:  No rashes or significant lesions  HEAD: Normocephalic, No masses, lesions, tenderness or abnormalities  EYES: Icteric sclera.  ENT:  External ears normal ,lips , buccal mucosa, and tongue normal and mucous membranes are moist  LYMPH: No palpable lymphadenopathy  LUNGS: Clear to auscultation, no crackles or wheezes HEART: Regular rate & rhythm, no murmurs, no gallops, S1 normal and S2 normal  ABDOMEN: Abdomen soft, general tenderness, normal bowel sounds MSK: No CVA tenderness and no tenderness on percussion of the back or rib cage. EXTREMITIES: No edema, no skin discoloration or tenderness NEURO: Alert & oriented, no focal motor/sensory deficits.   LABORATORY DATA: I have personally reviewed the data as listed:  Clinical Support on 01/15/2017  Component Date Value Ref Range Status  . WBC 01/15/2017 3.6* 3.8 - 10.6 K/uL Final  . RBC 01/15/2017 3.39* 4.40 - 5.90 MIL/uL Final  . Hemoglobin 01/15/2017 11.2* 13.0 - 18.0 g/dL  Final  . HCT 01/15/2017 33.3* 40.0 - 52.0 % Final  . MCV 01/15/2017 98.0  80.0 - 100.0 fL Final  . MCH 01/15/2017 33.0  26.0 - 34.0 pg Final  . MCHC 01/15/2017 33.7  32.0 - 36.0 g/dL Final  . RDW 01/15/2017 14.3  11.5 - 14.5 % Final  . Platelets 01/15/2017 98* 150 - 440 K/uL Final  . Neutrophils Relative % 01/15/2017 67  % Final  . Neutro Abs 01/15/2017 2.4  1.4 - 6.5 K/uL Final  . Lymphocytes Relative 01/15/2017 25  % Final  . Lymphs Abs 01/15/2017 0.9* 1.0 - 3.6 K/uL Final  . Monocytes Relative 01/15/2017 6  % Final  . Monocytes Absolute 01/15/2017 0.2  0.2 - 1.0 K/uL Final  . Eosinophils Relative 01/15/2017 1  % Final  . Eosinophils Absolute 01/15/2017 0.0  0 - 0.7 K/uL Final  . Basophils Relative 01/15/2017 1  % Final  . Basophils Absolute 01/15/2017 0.0  0 - 0.1 K/uL Final  . Sodium 01/15/2017 126* 135 - 145 mmol/L Final  . Potassium 01/15/2017 4.4  3.5 - 5.1 mmol/L Final  . Chloride 01/15/2017 95* 101 - 111 mmol/L Final  . CO2 01/15/2017 25  22 - 32 mmol/L Final  . Glucose, Bld 01/15/2017 668  65 - 99 mg/dL Final   Comment: RESULT REPEATED AND VERIFIED CRITICAL RESULT CALLED TO, READ  BACK BY AND VERIFIED WITH: GAVE RESULT TO STACY MCDONALD _0 :55PM 09/15/16 PWB   . BUN 01/15/2017 13  6 - 20 mg/dL Final  . Creatinine, Ser 01/15/2017 1.04  0.61 - 1.24 mg/dL Final  . Calcium 01/15/2017 9.1  8.9 - 10.3 mg/dL Final  . Total Protein 01/15/2017 7.1  6.5 - 8.1 g/dL Final  . Albumin 01/15/2017 3.3* 3.5 - 5.0 g/dL Final  . AST 01/15/2017 107* 15 - 41 U/L Final  . ALT 01/15/2017 74* 17 - 63 U/L Final  . Alkaline Phosphatase 01/15/2017 165* 38 - 126 U/L Final  . Total Bilirubin 01/15/2017 2.2* 0.3 - 1.2 mg/dL Final  . GFR calc non Af Amer 01/15/2017 >60  >60 mL/min Final  . GFR calc Af Amer 01/15/2017 >60  >60 mL/min Final   Comment: (NOTE) The eGFR has been calculated using the CKD EPI equation. This calculation has not been validated in all clinical situations. eGFR's persistently <60 mL/min signify possible Chronic Kidney Disease.   . Anion gap 01/15/2017 6  5 - 15 Final  . Prothrombin Time 01/15/2017 15.3* 11.4 - 15.2 seconds Final  . INR 01/15/2017 1.20   Final  . aPTT 01/15/2017 32  24 - 36 seconds Final  . Bilirubin, Direct 01/15/2017 0.9* 0.1 - 0.5 mg/dL Final    RADIOGRAPHIC STUDIES: I have personally reviewed the radiological images as listed and agree with the findings in the report 01/15/2017 [place] bilateral lower extremity doppler: No lower extremity DVT in either side.  11/30/2016: CT abdomen pelvis w contrast: Stable chronic changes as described above.Small gastrohepatic ligament lymph nodes slightly more prominent than that seen on the prior exam likely of a reactive nature. 11/30/2016: CT head wo contrast: Chronic stable small vessel ischemic disease of periventricular white matter. No acute intracranial abnormality.   ASSESSMENT/PLAN Cancer Staging Malignant neoplasm of sigmoid colon ([place]) - No stage assigned - Unsigned   1. Malignant neoplasm of sigmoid colon ([place])   2. Hyperbilirubinemia   3. Transaminitis   4. Type 2 diabetes mellitus with  hyperglycemia, with long-term current use of insulin ([facility])   5.  Noncompliance   6. Chronic diarrhea   7. ETOH abuse   8. Tobacco dependence   9. Cachexia ([place])   10. Generalized abdominal pain   .  Plan:  # I discussed with patient in detail about colon cancer disease nature and the extent of his cancer. I also emphasize the importance of compliance of keeping doctor appointment, lab and image appointments. Patient explains that the reason he has not kept his appointment for work up is mainly due to transportation. I informed patient that transportation service can be arranged to help him. Patient expresses his willingness to finish his work up.   # He has icteric sclera which prompt suspecting of abnormal liver function. He has not had lab work since early June. I send him for STAT CBC and CMP. He has worsened  LFTs, high AST, ALT, Alk phos, and hyperbilirubinemia which is new from June 2018. Although this may reflect his ETOH use, the new hyperbilirubinemia definite lead to suspicion of liver metastasis during the interval. I plan to restage him with CT chest, abdomen, pelvis next week.  He has a repeat rectal mass biopsy scheduled on Augst 7 with Dr.Davis.   # Today's STAT lab shows that he is hyperglycemic, with sodium level of 126 (possible pseudohyponatremia). I asked patient to go to ER for further work up and treatment and patient declined. He is aware of the risk of  severe consequence including death leaving these abnormality untreated. I encourage him to stay hydrated and be compliant with his insuline.   # He was prescribed a STAT US lower extremities by Dr.Davis yesterday due to the concerns of lower extremity tenderness and swelling, right> Left. He did not get it done. I spend time with him and convinced him to get it done today. RN send patient to [place] and study was done, negative for DVT bilaterally.   # For his pain, he does not have any physician prescribing pain medication  (previously released from pain clinic due to non compliance). I prescribed low dose Oxycondone 36m Q4h PRN pain, dispense 30 tablets. I will see him next week to titrate the pain medication. I advise him to stop taking Tylenol.  # I check pre albumin to evaluate his nutritional status.   # He has a strong history of family history of colon cancer, needs genetic counseling. For now we will concentrate on getting him finish work up and start treatment/surgery. Will revisit this in the future.   # Advise patient to stop ETOH consumption. He reports that he has not been drinking lately. Smoke cessation discussed.    Orders Placed This Encounter  Procedures  . CBC with Differential/Platelet    Standing Status:   Future    Standing Expiration Date:   01/15/2018  . Comprehensive metabolic panel    Standing Status:   Future    Standing Expiration Date:   01/15/2018  . Protime-INR    Standing Status:   Future    Standing Expiration Date:   01/15/2018  . APTT    Standing Status:   Future    Standing Expiration Date:   01/15/2018  . CEA    Standing Status:   Future    Standing Expiration Date:   01/15/2018  . Prealbumin    Standing Status:   Future    Standing Expiration Date:   01/15/2018    All questions were answered. The patient knows to call the clinic with any problems, questions or concerns. Return visit  in 1 week for pain titration and he will have CT scan later that day. Thank you for this kind referral and the opportunity to participate in the care of this patient. A copy of today's note is routed to referring provider [facility] visit start time: 10:35AM End time: 11:55AM Total face to face encounter time for this patient visit was 57mn. >50% of the time was  spent in counseling and coordination of care.   Dr. ZEarlie Server MD, PhD CParis Surgery Center LLCat AParkridge Medical CenterPager- [phone]/19/2018

## 2017-01-16 LAB — CEA: CEA: 18.5 ng/mL — ABNORMAL HIGH (ref 0.0–4.7)

## 2017-01-19 ENCOUNTER — Other Ambulatory Visit: Payer: Self-pay

## 2017-01-19 DIAGNOSIS — C185 Malignant neoplasm of splenic flexure: Secondary | ICD-10-CM

## 2017-01-22 ENCOUNTER — Other Ambulatory Visit: Payer: Self-pay | Admitting: Oncology

## 2017-01-22 DIAGNOSIS — D649 Anemia, unspecified: Secondary | ICD-10-CM

## 2017-01-22 DIAGNOSIS — R748 Abnormal levels of other serum enzymes: Secondary | ICD-10-CM

## 2017-01-22 NOTE — Telephone Encounter (Signed)
Ginger from [facility] was able to get in contact with the patient and his Flexible Sigmoidoscopy was rescheduled for 02/03/2017 with Dr. Allen Norris. Patient was told about this appointment when he came in last on 01/14/2017.

## 2017-01-23 ENCOUNTER — Other Ambulatory Visit: Payer: Self-pay | Admitting: Hematology and Oncology

## 2017-01-23 ENCOUNTER — Ambulatory Visit
Admission: RE | Admit: 2017-01-23 | Discharge: 2017-01-23 | Disposition: A | Discharge status: Home / Self Care | Payer: Medicaid Other | Source: Ambulatory Visit | Attending: Oncology | Admitting: Oncology

## 2017-01-23 ENCOUNTER — Inpatient Hospital Stay: Payer: Medicaid Other

## 2017-01-23 ENCOUNTER — Inpatient Hospital Stay (HOSPITAL_BASED_OUTPATIENT_CLINIC_OR_DEPARTMENT_OTHER): Payer: Medicaid Other | Admitting: Oncology

## 2017-01-23 ENCOUNTER — Encounter: Payer: Self-pay | Admitting: Oncology

## 2017-01-23 VITALS — BP 107/72 | HR 88 | Temp 98.4°F | Wt 114.0 lb

## 2017-01-23 DIAGNOSIS — I700 Atherosclerosis of aorta: Secondary | ICD-10-CM | POA: Insufficient documentation

## 2017-01-23 DIAGNOSIS — N2889 Other specified disorders of kidney and ureter: Secondary | ICD-10-CM | POA: Insufficient documentation

## 2017-01-23 DIAGNOSIS — C187 Malignant neoplasm of sigmoid colon: Secondary | ICD-10-CM | POA: Diagnosis present

## 2017-01-23 DIAGNOSIS — K8689 Other specified diseases of pancreas: Secondary | ICD-10-CM

## 2017-01-23 DIAGNOSIS — E440 Moderate protein-calorie malnutrition: Secondary | ICD-10-CM | POA: Diagnosis not present

## 2017-01-23 DIAGNOSIS — Z79899 Other long term (current) drug therapy: Secondary | ICD-10-CM

## 2017-01-23 DIAGNOSIS — D649 Anemia, unspecified: Secondary | ICD-10-CM

## 2017-01-23 DIAGNOSIS — F17210 Nicotine dependence, cigarettes, uncomplicated: Secondary | ICD-10-CM | POA: Diagnosis not present

## 2017-01-23 DIAGNOSIS — K5730 Diverticulosis of large intestine without perforation or abscess without bleeding: Secondary | ICD-10-CM | POA: Insufficient documentation

## 2017-01-23 DIAGNOSIS — R1084 Generalized abdominal pain: Secondary | ICD-10-CM

## 2017-01-23 DIAGNOSIS — R590 Localized enlarged lymph nodes: Secondary | ICD-10-CM | POA: Diagnosis not present

## 2017-01-23 DIAGNOSIS — D1803 Hemangioma of intra-abdominal structures: Secondary | ICD-10-CM | POA: Diagnosis not present

## 2017-01-23 DIAGNOSIS — R938 Abnormal findings on diagnostic imaging of other specified body structures: Secondary | ICD-10-CM | POA: Diagnosis not present

## 2017-01-23 DIAGNOSIS — R740 Nonspecific elevation of levels of transaminase and lactic acid dehydrogenase [LDH]: Secondary | ICD-10-CM | POA: Diagnosis not present

## 2017-01-23 DIAGNOSIS — M5136 Other intervertebral disc degeneration, lumbar region: Secondary | ICD-10-CM | POA: Diagnosis not present

## 2017-01-23 DIAGNOSIS — R748 Abnormal levels of other serum enzymes: Secondary | ICD-10-CM

## 2017-01-23 HISTORY — DX: Malignant (primary) neoplasm, unspecified: C80.1

## 2017-01-23 LAB — COMPREHENSIVE METABOLIC PANEL WITH GFR
ALT: 46 U/L (ref 17–63)
AST: 56 U/L — ABNORMAL HIGH (ref 15–41)
Albumin: 3 g/dL — ABNORMAL LOW (ref 3.5–5.0)
Alkaline Phosphatase: 123 U/L (ref 38–126)
Anion gap: 9 (ref 5–15)
BUN: 15 mg/dL (ref 6–20)
CO2: 22 mmol/L (ref 22–32)
Calcium: 8.9 mg/dL (ref 8.9–10.3)
Chloride: 97 mmol/L — ABNORMAL LOW (ref 101–111)
Creatinine, Ser: 1.16 mg/dL (ref 0.61–1.24)
GFR calc Af Amer: >60 mL/min
GFR calc non Af Amer: >60 mL/min
Glucose, Bld: 536 mg/dL (ref 65–99)
Potassium: 4.1 mmol/L (ref 3.5–5.1)
Sodium: 128 mmol/L — ABNORMAL LOW (ref 135–145)
Total Bilirubin: 1.2 mg/dL (ref 0.3–1.2)
Total Protein: 6.4 g/dL — ABNORMAL LOW (ref 6.5–8.1)

## 2017-01-23 LAB — VITAMIN B12: VITAMIN B 12: 427 pg/mL (ref 180–914)

## 2017-01-23 LAB — FOLATE: FOLATE: 15.3 ng/mL (ref >5.9)

## 2017-01-23 MED ORDER — IOPAMIDOL (ISOVUE-300) INJECTION 61%
75.0000 mL | Freq: Once | INTRAVENOUS | Status: AC | PRN
  Administered 2017-01-23: 75 mL via INTRAVENOUS

## 2017-01-23 MED ORDER — PANCRELIPASE (LIP-PROT-AMYL) 24000-76000 UNITS PO CPEP: 24000.0000 [IU] | ORAL_CAPSULE | Freq: Three times a day (TID) | ORAL | 1 refills | Status: DC

## 2017-01-23 MED ORDER — OXYCODONE HCL 10 MG PO TABS: 10.0000 mg | ORAL_TABLET | Freq: Four times a day (QID) | ORAL | 0 refills | Status: DC | PRN

## 2017-01-23 NOTE — Progress Notes (Signed)
[facility] Initial Visit:  Patient Care Team: [name], MD as PCP - General (Internal Medicine)  CHIEF COMPLAINTS/PURPOSE OF CONSULTATION: Abdominal pain. Recently diagnosed colon cancer  HISTORY OF PRESENTING ILLNESS: [name] 56 y.o. male with PMH listed below is referred by Dr.Davis to me for evaluation of recently diagnosed colorectal cancer. Patient reports that he has been having significant weight loss, abdominal pain, persistent diarrhea for past few months. And more recently he lost 15 pounds in the past 6 weeks.  He had GI work up including EGD and colonoscopy in June 2018 which revealed non obstructing large mass in sigmoid colon, several colon polyps, one rectal polyp and a large sessile mass in cardia.  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 Rectal polyp showed small fragments of adenocarcinoma.  11/30/2016 CT abdomen and pelvis with contrast showed no evidence of metastatic disease.  11/30/2016 CT head showed no acute intracranial abnormality Due to non compliancy, he has not done any advised follow up or work up till now. He was seen by Dr.Davis on 01/15/2017.  Today he reports abdominal pain which he rates 9 out of 10. He requests to get pain medication. He used to follow up with a pain clinic and due to noncompliance with appointment, he was discharged from there and he reports not having any pain medication and has tried tylenol with no relief of his pain.  Patient otherwise denies any fever/chills, CP, or any worsening of his chronic SOB. He denies drinking alcohol, however per previous note, his family members reported that he continues to drink 5-6 beers a day and more excessively on weekends. Today his girlfriend drop him to cancer center and left. Patient has not get  the [place] lower extremity doppler done which was ordered by Dr.Davis yesterday due to the concern of lower extremity tenderness and swelling.  Patient has significant family history including father died at age of 75 with colon cancer and his brother died at age of age of 75 due to colon cancer. Per patient, they did not get genetic testing.  Patient is single, lives with his mother who is 35 years old. He has total 4 children, youngest kid is 11 years old who does not live with him.   Interval history Today patient is accompanied by his girlfriend to the clinic for follow-up visit. Patient continued to have diarrhea, and upper and lower abdominal pain. He rates the pain as 9 out of 10. He takes oxycodone 5 mg every 6 hours as needed Panafil that the pain is not adequately relieved. Patient reports that he used to take oxycodone 10 mg. Patient claims that he has stopped drinking alcohol. Patient had questions history of alcoholic pancreatitis, and he reports chronic bloating diarrhea and upper abdomen discomfort. Denies fever chills, chest pain shortness of breath. Appetite is fair  Review of Systems  Constitutional: Positive for fatigue.  HENT:  Negative.   Eyes: Negative.   Respiratory: Negative.   Cardiovascular: Positive for leg swelling.  Gastrointestinal: Positive for abdominal distention, abdominal pain and diarrhea.  Endocrine: Negative.   Genitourinary: Negative.    Musculoskeletal: Negative.   Skin: Negative.   Neurological: Negative.   Hematological: Negative.   Psychiatric/Behavioral: Negative.     MEDICAL HISTORY: Past Medical History:  Diagnosis Date  . Anemia   .  BPH without obstruction/lower urinary tract symptoms   . Cancer ([place])   . Cellulitis of scrotum   . Cyst of prostate 05/13/2015  . Diabetes ([place])   . Difficulty urinating   . Dyspnea   . Epididymoorchitis   . ETOH abuse   . Fracture of right tibia and fibula   . GERD (gastroesophageal reflux disease)   . GI  bleed   . Pancreatitis   . Trichomoniasis   . Wears dentures    Partial upper and lower.  reports poor fit.    SURGICAL HISTORY: Past Surgical History:  Procedure Laterality Date  . COLONOSCOPY WITH PROPOFOL N/A 12/02/2016   Procedure: COLONOSCOPY WITH PROPOFOL;  Surgeon: Lucilla Lame, MD;  Location: [facility] ENDOSCOPY;  Service: Endoscopy;  Laterality: N/A;  . ESOPHAGOGASTRODUODENOSCOPY (EGD) WITH PROPOFOL N/A 12/02/2016   Procedure: ESOPHAGOGASTRODUODENOSCOPY (EGD) WITH PROPOFOL;  Surgeon: Lucilla Lame, MD;  Location: [facility] ENDOSCOPY;  Service: Endoscopy;  Laterality: N/A;  . FRACTURE SURGERY     TIBIA AND FIBULA  . FRACTURE SURGERY    . resection of pancreas    . SCROTAL EXPLORATION      SOCIAL HISTORY: Social History   Social History  . Marital status: Single    Spouse name: N/A  . Number of children: N/A  . Years of education: N/A   Occupational History  . Not on file.   Social History Main Topics  . Smoking status: Current Every Day Smoker    Packs/day: 0.50    Years: 41.00    Types: Cigarettes  . Smokeless tobacco: Never Used     Comment: since age 52  . Alcohol use 0.0 oz/week     Comment: occasionally  . Drug use: No  . Sexual activity: Not on file   Other Topics Concern  . Not on file   Social History Narrative  . No narrative on file    FAMILY HISTORY Family History  Problem Relation Age of Onset  . Cancer - Colon Father   . Colon cancer Brother     ALLERGIES:  is allergic to aspirin.  MEDICATIONS:  Current Outpatient Prescriptions  Medication Sig Dispense Refill  . NOVOLOG FLEXPEN 100 UNIT/ML FlexPen Inject 48 Units into the skin 3 (three) times daily with meals.   1  . B-D UF III MINI PEN NEEDLES 31G X 5 MM MISC   1  . LANTUS SOLOSTAR 100 UNIT/ML Solostar Pen Inject 10 Units into the skin 2 (two) times daily. (Patient taking differently: Inject 10 Units into the skin daily at 10 pm. ) 15 mL 1  . Oxycodone HCl 10 MG TABS Take 1 tablet (10 mg  total) by mouth every 6 (six) hours as needed. 60 tablet 0  . Pancrelipase, Lip-Prot-Amyl, 24000-76000 units CPEP Take 1 capsule (24,000 Units total) by mouth 3 (three) times daily. 180 capsule 1   No current facility-administered medications for this visit.     PHYSICAL EXAMINATION:  ECOG PERFORMANCE STATUS: 1 - Symptomatic but completely ambulatory   Vitals:   01/23/17 1143  BP: 107/72  Pulse: 88  Temp: 98.4 F (36.9 C)    Filed Weights   01/23/17 1143  Weight: 114 lb (51.7 kg)     Physical Exam GENERAL: mild distress, cachectic  SKIN:  No rashes or significant lesions  HEAD: Normocephalic, No masses, lesions, tenderness or abnormalities  EYES: Icteric sclera.  ENT: External ears normal ,lips , buccal mucosa, and tongue normal and mucous membranes are moist  LYMPH: No palpable  lymphadenopathy  LUNGS: Clear to auscultation, no crackles or wheezes HEART: Regular rate & rhythm, no murmurs, no gallops, S1 normal and S2 normal  ABDOMEN: Abdomen soft, general tenderness, normal bowel sounds MSK: No CVA tenderness and no tenderness on percussion of the back or rib cage. EXTREMITIES: No edema, no skin discoloration or tenderness NEURO: Alert & oriented, no focal motor/sensory deficits.   LABORATORY DATA: I have personally reviewed the data as listed:  Appointment on 01/23/2017  Component Date Value Ref Range Status  . Sodium 01/23/2017 128* 135 - 145 mmol/L Final  . Potassium 01/23/2017 4.1  3.5 - 5.1 mmol/L Final  . Chloride 01/23/2017 97* 101 - 111 mmol/L Final  . CO2 01/23/2017 22  22 - 32 mmol/L Final  . Glucose, Bld 01/23/2017 536* 65 - 99 mg/dL Final   Comment: RESULTS VERIFIED BY REPEAT TESTING CRITICAL RESULT CALLED TO, READ BACK BY AND VERIFIED WITH STACEY MCDONALD @ 11:52 AM 01/23/2017 LGR   . BUN 01/23/2017 15  6 - 20 mg/dL Final  . Creatinine, Ser 01/23/2017 1.16  0.61 - 1.24 mg/dL Final  . Calcium 01/23/2017 8.9  8.9 - 10.3 mg/dL Final  . Total Protein  01/23/2017 6.4* 6.5 - 8.1 g/dL Final  . Albumin 01/23/2017 3.0* 3.5 - 5.0 g/dL Final  . AST 01/23/2017 56* 15 - 41 U/L Final  . ALT 01/23/2017 46  17 - 63 U/L Final  . Alkaline Phosphatase 01/23/2017 123  38 - 126 U/L Final  . Total Bilirubin 01/23/2017 1.2  0.3 - 1.2 mg/dL Final  . GFR calc non Af Amer 01/23/2017 >60  >60 mL/min Final  . GFR calc Af Amer 01/23/2017 >60  >60 mL/min Final   Comment: (NOTE) The eGFR has been calculated using the CKD EPI equation. This calculation has not been validated in all clinical situations. eGFR's persistently <60 mL/min signify possible Chronic Kidney Disease.   . Anion gap 01/23/2017 9  5 - 15 Final  . Folate 01/23/2017 15.3  >5.9 ng/mL Final  Clinical Support on 01/15/2017  Component Date Value Ref Range Status  . WBC 01/15/2017 3.6* 3.8 - 10.6 K/uL Final  . RBC 01/15/2017 3.39* 4.40 - 5.90 MIL/uL Final  . Hemoglobin 01/15/2017 11.2* 13.0 - 18.0 g/dL Final  . HCT 01/15/2017 33.3* 40.0 - 52.0 % Final  . MCV 01/15/2017 98.0  80.0 - 100.0 fL Final  . MCH 01/15/2017 33.0  26.0 - 34.0 pg Final  . MCHC 01/15/2017 33.7  32.0 - 36.0 g/dL Final  . RDW 01/15/2017 14.3  11.5 - 14.5 % Final  . Platelets 01/15/2017 98* 150 - 440 K/uL Final  . Neutrophils Relative % 01/15/2017 67  % Final  . Neutro Abs 01/15/2017 2.4  1.4 - 6.5 K/uL Final  . Lymphocytes Relative 01/15/2017 25  % Final  . Lymphs Abs 01/15/2017 0.9* 1.0 - 3.6 K/uL Final  . Monocytes Relative 01/15/2017 6  % Final  . Monocytes Absolute 01/15/2017 0.2  0.2 - 1.0 K/uL Final  . Eosinophils Relative 01/15/2017 1  % Final  . Eosinophils Absolute 01/15/2017 0.0  0 - 0.7 K/uL Final  . Basophils Relative 01/15/2017 1  % Final  . Basophils Absolute 01/15/2017 0.0  0 - 0.1 K/uL Final  . Sodium 01/15/2017 126* 135 - 145 mmol/L Final  . Potassium 01/15/2017 4.4  3.5 - 5.1 mmol/L Final  . Chloride 01/15/2017 95* 101 - 111 mmol/L Final  . CO2 01/15/2017 25  22 - 32 mmol/L Final  .  Glucose, Bld  01/15/2017 668  65 - 99 mg/dL Final   Comment: RESULT REPEATED AND VERIFIED CRITICAL RESULT CALLED TO, READ BACK BY AND VERIFIED WITH: GAVE RESULT TO STACY MCDONALD _0 :55PM 09/15/16 PWB   . BUN 01/15/2017 13  6 - 20 mg/dL Final  . Creatinine, Ser 01/15/2017 1.04  0.61 - 1.24 mg/dL Final  . Calcium 01/15/2017 9.1  8.9 - 10.3 mg/dL Final  . Total Protein 01/15/2017 7.1  6.5 - 8.1 g/dL Final  . Albumin 01/15/2017 3.3* 3.5 - 5.0 g/dL Final  . AST 01/15/2017 107* 15 - 41 U/L Final  . ALT 01/15/2017 74* 17 - 63 U/L Final  . Alkaline Phosphatase 01/15/2017 165* 38 - 126 U/L Final  . Total Bilirubin 01/15/2017 2.2* 0.3 - 1.2 mg/dL Final  . GFR calc non Af Amer 01/15/2017 >60  >60 mL/min Final  . GFR calc Af Amer 01/15/2017 >60  >60 mL/min Final   Comment: (NOTE) The eGFR has been calculated using the CKD EPI equation. This calculation has not been validated in all clinical situations. eGFR's persistently <60 mL/min signify possible Chronic Kidney Disease.   . Anion gap 01/15/2017 6  5 - 15 Final  . Prothrombin Time 01/15/2017 15.3* 11.4 - 15.2 seconds Final  . INR 01/15/2017 1.20   Final  . aPTT 01/15/2017 32  24 - 36 seconds Final  . CEA 01/15/2017 18.5* 0.0 - 4.7 ng/mL Final   Comment: (NOTE)       Roche ECLIA methodology       Nonsmokers  <3.9                                     Smokers     <5.6 Performed At: [facility] [place], [place] [id number] Lindon Romp MD [id number]   . Prealbumin 01/15/2017 10.9* 18 - 38 mg/dL Final   Performed at [facility], [place] [address], [place], [place] [address]  . Bilirubin, Direct 01/15/2017 0.9* 0.1 - 0.5 mg/dL Final    RADIOGRAPHIC STUDIES: I have personally reviewed the radiological images as listed and agree with the findings in the report 01/15/2017 [place] bilateral lower extremity doppler: No lower extremity DVT in either side.  11/30/2016: CT abdomen pelvis w contrast: Stable chronic changes as described  above.Small gastrohepatic ligament lymph nodes slightly more prominent than that seen on the prior exam likely of a reactive nature. 11/30/2016: CT head wo contrast: Chronic stable small vessel ischemic disease of periventricular white matter. No acute intracranial abnormality.   ASSESSMENT/PLAN   1. Malignant neoplasm of sigmoid colon ([place])   2. Moderate protein-calorie malnutrition ([place])   3. Pancreatic insufficiency   4. Generalized abdominal pain   .  Plan:  #Again I discussed with patient in detail about colon cancer disease nature and the extent of his cancer. I also emphasize the importance of compliance of keeping doctor appointment, lab and image appointments. I informed patient that transportation service can be arranged to help him. Patient expresses his willingness to finish his work up.   # He has icteric sclera which prompt suspecting of abnormal liver function. He has not had lab work since early June. I send him for STAT CBC and CMP. He has worsened  LFTs, high AST, ALT, Alk phos, and hyperbilirubinemia which is new from June 2018. Although this may reflect his ETOH use, the new hyperbilirubinemia definite lead  to suspicion of liver metastasis during the interval. He is getting CT chest, abdomen, pelvis today.  He has a repeat rectal mass biopsy scheduled on Augst 7 with Dr.Davis.   # Today's lab shows that he is hyperglycemic, with sodium level of 128 (possible pseudohyponatremia),slightly better than last week. .I encourage him to stay hydrated and be compliant with his insuline.   # Titrate his pain medication to oxycodone 66m Q6 hour PRN, dispense 60 pill.  # Trial of Creon to see if it will improve his abdominal symptoms.   # check hepatitis panel.  # He has a strong history of family history of colon cancer, needs genetic counseling. For now we will concentrate on getting him finish work up and start treatment/surgery. Will revisit this in the future.   # Advise patient  to stop ETOH consumption. He reports that he has not been drinking lately. Smoke cessation discussed.  # Dietitian consultation.   Orders Placed This Encounter  Procedures  . Comprehensive metabolic panel    Standing Status:   Future    Standing Expiration Date:   01/23/2018  . Amb Referral to Nutrition and Diabetic E    Referral Priority:   Routine    Referral Type:   Consultation    Referral Reason:   Specialty Services Required    Number of Visits Requested:   1  . Consult to dietitian    All questions were answered. The patient knows to call the clinic with any problems, questions or concerns. Return visit in 1 week to discuss CT result.   Dr. ZEarlie Server MD, PhD CShands Lake Shore Regional Medical Centerat ACopiah County Medical CenterPager- [phone]/27/2018

## 2017-01-23 NOTE — Progress Notes (Signed)
Patient here today for follow up.  Patient states no new concerns today  

## 2017-01-24 LAB — HEPATITIS PANEL, ACUTE
HCV Ab: 0.1 s/co ratio (ref 0.0–0.9)
HEP A IGM: NEGATIVE
HEP B C IGM: NEGATIVE
Hepatitis B Surface Ag: NEGATIVE

## 2017-01-29 ENCOUNTER — Encounter: Payer: Self-pay | Admitting: Oncology

## 2017-01-29 ENCOUNTER — Inpatient Hospital Stay: Payer: Medicaid Other | Attending: Oncology

## 2017-01-29 ENCOUNTER — Inpatient Hospital Stay (HOSPITAL_BASED_OUTPATIENT_CLINIC_OR_DEPARTMENT_OTHER): Payer: Medicaid Other | Admitting: Oncology

## 2017-01-29 VITALS — BP 101/68 | HR 81 | Temp 97.2°F | Resp 18 | Wt 113.1 lb

## 2017-01-29 DIAGNOSIS — D125 Benign neoplasm of sigmoid colon: Secondary | ICD-10-CM | POA: Diagnosis not present

## 2017-01-29 DIAGNOSIS — F17210 Nicotine dependence, cigarettes, uncomplicated: Secondary | ICD-10-CM | POA: Insufficient documentation

## 2017-01-29 DIAGNOSIS — R109 Unspecified abdominal pain: Secondary | ICD-10-CM

## 2017-01-29 DIAGNOSIS — E1165 Type 2 diabetes mellitus with hyperglycemia: Secondary | ICD-10-CM

## 2017-01-29 DIAGNOSIS — C187 Malignant neoplasm of sigmoid colon: Secondary | ICD-10-CM

## 2017-01-29 DIAGNOSIS — Z794 Long term (current) use of insulin: Secondary | ICD-10-CM

## 2017-01-29 DIAGNOSIS — K8689 Other specified diseases of pancreas: Secondary | ICD-10-CM | POA: Insufficient documentation

## 2017-01-29 DIAGNOSIS — K529 Noninfective gastroenteritis and colitis, unspecified: Secondary | ICD-10-CM

## 2017-01-29 DIAGNOSIS — Z79899 Other long term (current) drug therapy: Secondary | ICD-10-CM | POA: Insufficient documentation

## 2017-01-29 DIAGNOSIS — G8929 Other chronic pain: Secondary | ICD-10-CM | POA: Insufficient documentation

## 2017-01-29 DIAGNOSIS — N400 Enlarged prostate without lower urinary tract symptoms: Secondary | ICD-10-CM | POA: Insufficient documentation

## 2017-01-29 DIAGNOSIS — Z9119 Patient's noncompliance with other medical treatment and regimen: Secondary | ICD-10-CM

## 2017-01-29 DIAGNOSIS — E440 Moderate protein-calorie malnutrition: Secondary | ICD-10-CM

## 2017-01-29 DIAGNOSIS — K219 Gastro-esophageal reflux disease without esophagitis: Secondary | ICD-10-CM | POA: Insufficient documentation

## 2017-01-29 DIAGNOSIS — F1010 Alcohol abuse, uncomplicated: Secondary | ICD-10-CM

## 2017-01-29 DIAGNOSIS — C19 Malignant neoplasm of rectosigmoid junction: Secondary | ICD-10-CM

## 2017-01-29 DIAGNOSIS — K859 Acute pancreatitis without necrosis or infection, unspecified: Secondary | ICD-10-CM | POA: Diagnosis not present

## 2017-01-29 DIAGNOSIS — Z91199 Patient's noncompliance with other medical treatment and regimen due to unspecified reason: Secondary | ICD-10-CM

## 2017-01-29 MED ORDER — OXYCODONE HCL 10 MG PO TABS: 10.0000 mg | ORAL_TABLET | Freq: Four times a day (QID) | ORAL | 0 refills | Status: DC | PRN

## 2017-01-29 NOTE — Progress Notes (Signed)
[facility] Initial Visit:  Patient Care Team: [name], MD as PCP - General (Internal Medicine) Clent Jacks, RN as Registered Nurse  CHIEF COMPLAINTS/PURPOSE OF CONSULTATION: Abdominal pain. Recently diagnosed colon cancer  HISTORY OF PRESENTING ILLNESS: [name] 56 y.o. male with PMH listed below is referred by Dr.Davis to me for evaluation of recently diagnosed colorectal cancer. Patient reports that he has been having significant weight loss, abdominal pain, persistent diarrhea for past few months. And more recently he lost 15 pounds in the past 6 weeks.  He had GI work up including EGD and colonoscopy in June 2018 which revealed non obstructing large mass in sigmoid colon, several colon polyps, one rectal polyp and a large sessile mass in cardia.  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 Rectal polyp showed small fragments of adenocarcinoma.  11/30/2016 CT abdomen and pelvis with contrast showed no evidence of metastatic disease.  11/30/2016 CT head showed no acute intracranial abnormality Due to non compliancy, he has not done any advised follow up or work up till now. He was seen by Dr.Davis on 01/15/2017.  Today he reports abdominal pain which he rates 9 out of 10. He requests to get pain medication. He used to follow up with a pain clinic and due to noncompliance with appointment, he was discharged from there and he reports not having any pain medication and has tried tylenol with no relief of his pain.  Patient otherwise denies any fever/chills, CP, or any worsening of his chronic SOB. He denies drinking alcohol, however per previous note, his family members reported that he continues to drink 5-6 beers a day and more excessively on weekends. Today his girlfriend drop him to  cancer center and left. Patient has not get the [place] lower extremity doppler done which was ordered by Dr.Davis yesterday due to the concern of lower extremity tenderness and swelling.  Patient has significant family history including father died at age of 16 with colon cancer and his brother died at age of age of 60 due to colon cancer. Per patient, they did not get genetic testing.  Patient is single, lives with his mother who is 49 years old. He has total 4 children, youngest kid is 42 years old who does not live with him.   Interval history Today patient is accompanied by his girlfriend to the clinic for follow-up visit. Patient continued to have diarrhea, and upper and lower abdominal pain. Marland Kitchen He takes oxycodone 10 mg every 6 hours as needed and he reports pain is better controlled.  He tried the generic Creon pills three times after each meals and feels that his diarrhea has also improved to some extent.  Patient claims that he has stopped drinking alcohol. He met dietitian today.  Review of Systems  Constitutional: Positive for fatigue.  HENT:  Negative.   Eyes: Negative.   Respiratory: Negative.   Cardiovascular: Positive for leg swelling.  Gastrointestinal: Positive for abdominal distention, abdominal pain and diarrhea.  Endocrine: Negative.   Genitourinary: Negative.    Musculoskeletal: Negative.   Skin: Negative.   Neurological: Negative.   Hematological: Negative.   Psychiatric/Behavioral: Negative.     MEDICAL HISTORY: Past Medical History:  Diagnosis Date  . Anemia   . BPH without obstruction/lower urinary tract symptoms   . Cancer ([place])   .  Cellulitis of scrotum   . Cyst of prostate 05/13/2015  . Diabetes ([place])   . Difficulty urinating   . Dyspnea   . Epididymoorchitis   . ETOH abuse   . Fracture of right tibia and fibula   . GERD (gastroesophageal reflux disease)   . GI bleed   . Pancreatitis   . Trichomoniasis   . Wears dentures    Partial upper and lower.   reports poor fit.    SURGICAL HISTORY: Past Surgical History:  Procedure Laterality Date  . COLONOSCOPY WITH PROPOFOL N/A 12/02/2016   Procedure: COLONOSCOPY WITH PROPOFOL;  Surgeon: Lucilla Lame, MD;  Location: [facility] END[facility]COPY;  Service: Endoscopy;  Laterality: N/A;  . ESOPHAGOGASTRODUODENOSCOPY (EGD) WITH PROPOFOL N/A 12/02/2016   Procedure: ESOPHAGOGASTRODUODENOSCOPY (EGD) WITH PROPOFOL;  Surgeon: Lucilla Lame, MD;  Location: [facility] ENDOSCOPY;  Service: Endoscopy;  Laterality: N/A;  . FRACTURE SURGERY     TIBIA AND FIBULA  . FRACTURE SURGERY    . resection of pancreas    . SCROTAL EXPLORATION      SOCIAL HISTORY: Social History   Social History  . Marital status: Single    Spouse name: N/A  . Number of children: N/A  . Years of education: N/A   Occupational History  . Not on file.   Social History Main Topics  . Smoking status: Current Every Day Smoker    Packs/day: 0.50    Years: 41.00    Types: Cigarettes  . Smokeless tobacco: Never Used     Comment: since age 74  . Alcohol use 0.0 oz/week     Comment: occasionally  . Drug use: No  . Sexual activity: Not on file   Other Topics Concern  . Not on file   Social History Narrative  . No narrative on file    FAMILY HISTORY Family History  Problem Relation Age of Onset  . Cancer - Colon Father   . Colon cancer Brother     ALLERGIES:  is allergic to aspirin.  MEDICATIONS:  Current Outpatient Prescriptions  Medication Sig Dispense Refill  . B-D UF III MINI PEN NEEDLES 31G X 5 MM MISC   1  . LANTUS SOLOSTAR 100 UNIT/ML Solostar Pen Inject 10 Units into the skin 2 (two) times daily. (Patient taking differently: Inject 10 Units into the skin daily at 10 pm. ) 15 mL 1  . NOVOLOG FLEXPEN 100 UNIT/ML FlexPen Inject 48 Units into the skin 3 (three) times daily with meals.   1  . Oxycodone HCl 10 MG TABS Take 1 tablet (10 mg total) by mouth every 6 (six) hours as needed. 60 tablet 0  . Pancrelipase, Lip-Prot-Amyl,  24000-76000 units CPEP Take 1 capsule (24,000 Units total) by mouth 3 (three) times daily. 180 capsule 1   No current facility-administered medications for this visit.     PHYSICAL EXAMINATION:  ECOG PERFORMANCE STATUS: 1 - Symptomatic but completely ambulatory   There were no vitals filed for this visit.  There were no vitals filed for this visit.   Physical Exam GENERAL: mild distress, cachectic  SKIN:  No rashes or significant lesions  HEAD: Normocephalic, No masses, lesions, tenderness or abnormalities  EYES: Icteric sclera.  ENT: External ears normal ,lips , buccal mucosa, and tongue normal and mucous membranes are moist  LYMPH: No palpable lymphadenopathy  LUNGS: Clear to auscultation, no crackles or wheezes HEART: Regular rate & rhythm, no murmurs, no gallops, S1 normal and S2 normal  ABDOMEN: Abdomen soft, general tenderness, normal  bowel sounds MSK: No CVA tenderness and no tenderness on percussion of the back or rib cage. EXTREMITIES: No edema, no skin discoloration or tenderness NEURO: Alert & oriented, no focal motor/sensory deficits.   LABORATORY DATA: I have personally reviewed the data as listed:  Appointment on 01/23/2017  Component Date Value Ref Range Status  . Sodium 01/23/2017 128* 135 - 145 mmol/L Final  . Potassium 01/23/2017 4.1  3.5 - 5.1 mmol/L Final  . Chloride 01/23/2017 97* 101 - 111 mmol/L Final  . CO2 01/23/2017 22  22 - 32 mmol/L Final  . Glucose, Bld 01/23/2017 536* 65 - 99 mg/dL Final   Comment: RESULTS VERIFIED BY REPEAT TESTING CRITICAL RESULT CALLED TO, READ BACK BY AND VERIFIED WITH STACEY MCDONALD @ 11:52 AM 01/23/2017 LGR   . BUN 01/23/2017 15  6 - 20 mg/dL Final  . Creatinine, Ser 01/23/2017 1.16  0.61 - 1.24 mg/dL Final  . Calcium 01/23/2017 8.9  8.9 - 10.3 mg/dL Final  . Total Protein 01/23/2017 6.4* 6.5 - 8.1 g/dL Final  . Albumin 01/23/2017 3.0* 3.5 - 5.0 g/dL Final  . AST 01/23/2017 56* 15 - 41 U/L Final  . ALT 01/23/2017 46   17 - 63 U/L Final  . Alkaline Phosphatase 01/23/2017 123  38 - 126 U/L Final  . Total Bilirubin 01/23/2017 1.2  0.3 - 1.2 mg/dL Final  . GFR calc non Af Amer 01/23/2017 >60  >60 mL/min Final  . GFR calc Af Amer 01/23/2017 >60  >60 mL/min Final   Comment: (NOTE) The eGFR has been calculated using the CKD EPI equation. This calculation has not been validated in all clinical situations. eGFR's persistently <60 mL/min signify possible Chronic Kidney Disease.   . Anion gap 01/23/2017 9  5 - 15 Final  . Hepatitis B Surface Ag 01/23/2017 Negative  Negative Final  . HCV Ab 01/23/2017 0.1  0.0 - 0.9 s/co ratio Final   Comment: (NOTE)                                  Negative:     < 0.8                             Indeterminate: 0.8 - 0.9                                  Positive:     > 0.9 The CDC recommends that a positive HCV antibody result be followed up with a HCV Nucleic Acid Amplification test (161096). Performed At: [facility] [place], [place] [id number] Lindon Romp MD BJ:[id number]   . Hep A IgM 01/23/2017 Negative  Negative Final  . Hep B C IgM 01/23/2017 Negative  Negative Final  . Folate 01/23/2017 15.3  >5.9 ng/mL Final  . Vitamin B-12 01/23/2017 427  180 - 914 pg/mL Final   Comment: (NOTE) This assay is not validated for testing neonatal or myeloproliferative syndrome specimens for Vitamin B12 levels. Performed at [facility], [place] [address] [place], [place] [address]   Clinical Support on 01/15/2017  Component Date Value Ref Range Status  . WBC 01/15/2017 3.6* 3.8 - 10.6 K/uL Final  . RBC 01/15/2017 3.39* 4.40 - 5.90 MIL/uL Final  . Hemoglobin 01/15/2017 11.2* 13.0 - 18.0 g/dL  Final  . HCT 01/15/2017 33.3* 40.0 - 52.0 % Final  . MCV 01/15/2017 98.0  80.0 - 100.0 fL Final  . MCH 01/15/2017 33.0  26.0 - 34.0 pg Final  . MCHC 01/15/2017 33.7  32.0 - 36.0 g/dL Final  . RDW 01/15/2017 14.3  11.5 - 14.5 % Final  . Platelets  01/15/2017 98* 150 - 440 K/uL Final  . Neutrophils Relative % 01/15/2017 67  % Final  . Neutro Abs 01/15/2017 2.4  1.4 - 6.5 K/uL Final  . Lymphocytes Relative 01/15/2017 25  % Final  . Lymphs Abs 01/15/2017 0.9* 1.0 - 3.6 K/uL Final  . Monocytes Relative 01/15/2017 6  % Final  . Monocytes Absolute 01/15/2017 0.2  0.2 - 1.0 K/uL Final  . Eosinophils Relative 01/15/2017 1  % Final  . Eosinophils Absolute 01/15/2017 0.0  0 - 0.7 K/uL Final  . Basophils Relative 01/15/2017 1  % Final  . Basophils Absolute 01/15/2017 0.0  0 - 0.1 K/uL Final  . Sodium 01/15/2017 126* 135 - 145 mmol/L Final  . Potassium 01/15/2017 4.4  3.5 - 5.1 mmol/L Final  . Chloride 01/15/2017 95* 101 - 111 mmol/L Final  . CO2 01/15/2017 25  22 - 32 mmol/L Final  . Glucose, Bld 01/15/2017 668  65 - 99 mg/dL Final   Comment: RESULT REPEATED AND VERIFIED CRITICAL RESULT CALLED TO, READ BACK BY AND VERIFIED WITH: GAVE RESULT TO STACY MCDONALD '@12'$ :55PM 09/15/16 PWB   . BUN 01/15/2017 13  6 - 20 mg/dL Final  . Creatinine, Ser 01/15/2017 1.04  0.61 - 1.24 mg/dL Final  . Calcium 01/15/2017 9.1  8.9 - 10.3 mg/dL Final  . Total Protein 01/15/2017 7.1  6.5 - 8.1 g/dL Final  . Albumin 01/15/2017 3.3* 3.5 - 5.0 g/dL Final  . AST 01/15/2017 107* 15 - 41 U/L Final  . ALT 01/15/2017 74* 17 - 63 U/L Final  . Alkaline Phosphatase 01/15/2017 165* 38 - 126 U/L Final  . Total Bilirubin 01/15/2017 2.2* 0.3 - 1.2 mg/dL Final  . GFR calc non Af Amer 01/15/2017 >60  >60 mL/min Final  . GFR calc Af Amer 01/15/2017 >60  >60 mL/min Final   Comment: (NOTE) The eGFR has been calculated using the CKD EPI equation. This calculation has not been validated in all clinical situations. eGFR's persistently <60 mL/min signify possible Chronic Kidney Disease.   . Anion gap 01/15/2017 6  5 - 15 Final  . Prothrombin Time 01/15/2017 15.3* 11.4 - 15.2 seconds Final  . INR 01/15/2017 1.20   Final  . aPTT 01/15/2017 32  24 - 36 seconds Final  . CEA  01/15/2017 18.5* 0.0 - 4.7 ng/mL Final   Comment: (NOTE)       Roche ECLIA methodology       Nonsmokers  <3.9                                     Smokers     <5.6 Performed At: [facility] [place], [place] [id number] Lindon Romp MD VF:[id number]   . Prealbumin 01/15/2017 10.9* 18 - 38 mg/dL Final   Performed at [facility], [place] [address] [place],  [address]  . Bilirubin, Direct 01/15/2017 0.9* 0.1 - 0.5 mg/dL Final    RADIOGRAPHIC STUDIES: I have personally reviewed the radiological images as listed and agree with the findings in the report 01/15/2017  [place] bilateral lower extremity doppler: No lower extremity DVT in either side.  11/30/2016: CT abdomen pelvis w contrast: Stable chronic changes as described above.Small gastrohepatic ligament lymph nodes slightly more prominent than that seen on the prior exam likely of a reactive nature. 11/30/2016: CT head wo contrast: Chronic stable small vessel ischemic disease of periventricular white matter. No acute intracranial abnormality. 01/23/2017 CT chest abdomen pelvis w contrast 1. The sigmoid colon mass is difficult to perceive. No definite adenopathy in this vicinity. No liver metastatic disease is identified. 2. Chronically stable right hepatic lobe hemangioma. 3. I am suspicious of hepatic steatosis. 4. Mildly enlarged right gastric lymph node at 1.1 cm, previously 1.2 cm, significance uncertain. 5. Other imaging findings of potential clinical significance: 7 mm hypodense lesion in the right kidney upper pole is technically nonspecific although statistically likely to be a cyst. Descending colon diverticulosis. Aortic Atherosclerosis (ICD10-I70.0). Chronic crescentic hypodensity in the right prostate gland, possibly a cyst or some type of urethral diverticulum. Lower lumbar degenerative disc disease.   ASSESSMENT/PLAN   1. Malignant neoplasm of sigmoid colon ([place])   2. Pancreatic insufficiency   3.  Moderate protein-calorie malnutrition ([place])   4. Type 2 diabetes mellitus with hyperglycemia, with long-term current use of insulin ([facility])   5. Chronic abdominal pain   6. Noncompliance   7. ETOH abuse   8. Chronic diarrhea   .  Plan:  # Reviewed image results with patient. It appears that he does not have evidence of metastatic disease. His flutrataed LFTs are likely due to fatty liver disease, alcohol related.  Again I discussed in lengh and emphasize the importance of compliance of keeping doctor appointment, lab and image appointments. I informed patient that transportation service can be arranged to help him. Patient expresses his willingness to finish his work up. His compliance seems to be improving as he has showed up to every appointments.   #  He has a repeat rectal mass biopsy scheduled on Augst 7 with Dr.Wohl.  Provided patient the phone number of Dr.Wohl's office and he will call tomorrow to find out the time of his biopsy appointment.   # Continue his pain medication, refilled oxycodone '10mg'$  Q6 hour PRN, dispense 60 pill.  # Continue Creon and seems it is helpful.  # He was seen by dietitian today.   # He has a strong history of family history of colon cancer, needs genetic counseling. For now we will concentrate on getting him finish work up and start treatment/surgery. Will revisit this in the future.   # Advise patient to stop ETOH consumption. He reports that he has not been drinking lately. Smoke cessation discussed.  # Tentative appointment with Dr.Davis for surgery management.  # Follow up with primary care provider for tighter control of DM.  All questions were answered. The patient knows to call the clinic with any problems, questions or concerns. Return visit in 2 week for evaluation of his pain and other symptom control, as well as biopsy results.   Dr. Earlie Server, MD, PhD [facility] Pager- [phone] 01/29/2017

## 2017-01-29 NOTE — Progress Notes (Addendum)
Nutrition Assessment   Reason for Assessment:  MD referral for malnutrition  ASSESSMENT:  56 year old male with new diagnosis of colorectal cancer. Planning repeat rectal mass biopys on 8/7. Past medical history of DM, etoh use, pancreatic insuffcency, chronic abdominal pain, diarrhea. Noted noncompliance to MD follow-up.  Patient seen in clinic prior to meeting with MD this pm.  Girlfriend with patient today.  Patient reports that he eats and has a good appetite.  Reports that whatever he eats goes right through him.  Reports diarrhea 5-6 times per day. Does not think creon has helped.  Reports that he eats cheese, eggs, bacon whatever he wants for breakfast, then for lunch has hamburger, potatoes and dinner maybe greens, creamed potatoes, pinto beans, pork chop or another meat.  Reports that he drinks water, gatorade, sometimes juice.  Patient thinks chocolate milk makes his diarrhea worse but then reports everything I eat causes me diarrhea.  Does not like the taste of boost/ensure shakes.    Nutrition Focused Physical Exam: Nutrition-Focused physical exam completed. Findings are severe fat depletion, moderate-severe muscle depletion, and no edema.   Reports he is mostly lays in the bed and watches TV during the day.  Medications: lantus, pancrelipase, novolog  Labs: reviewed  Reports he checks blood glucose 1-2 times per day and runs in 200s  Anthropometrics:   Height: 68 inches Weight: 114 lb UBW: 160-170 lb per reports was at that weight about 4-5 months ago BMI: 17  28 % weight loss in the last 4-5 months, significant   Estimated Energy Needs  Kcals: 1560-1800 calories/d Protein: 62-78 g/d Fluid: 1.8 L/d  NUTRITION DIAGNOSIS: Inadequate food and beverage intake related to new diagnosis of colorectal cancer, diarrhea as evidenced by 28% weight loss in 4-5 months   MALNUTRITION DIAGNOSIS: Patient meets criteria for severe malnutrition in chronic condition as evidenced by  28% weight loss in 4-5 months and severe fat and muscle mass depletion.     INTERVENTION:   Discussed importance of nutrition and encouraged good sources of protein.  Provided handout on soft moist protein foods (pt with limited teeth) Provided dairy free high calorie, high protein recipes for patient to try.  Discussed foods to eat to help with diarrhea.   Encouraged patient to check blood glucose regularly and notify MD with changes in blood gluocse   MONITORING, EVALUATION, GOAL: Patient will consume adequate calories and protein to meet nutritional needs and prevent further weight loss   NEXT VISIT: to be scheduled with upcoming appointments  Ndrew Creason B. Zenia Resides, [place], [place] Registered Dietitian [phone] (pager)

## 2017-02-02 ENCOUNTER — Encounter: Payer: Self-pay | Admitting: *Deleted

## 2017-02-02 ENCOUNTER — Encounter: Payer: Self-pay | Admitting: Anesthesiology

## 2017-02-02 ENCOUNTER — Other Ambulatory Visit: Payer: Self-pay

## 2017-02-02 DIAGNOSIS — C189 Malignant neoplasm of colon, unspecified: Secondary | ICD-10-CM

## 2017-02-03 ENCOUNTER — Ambulatory Visit: Admission: RE | Admit: 2017-02-03 | Payer: Medicaid Other | Source: Ambulatory Visit | Admitting: Gastroenterology

## 2017-02-03 ENCOUNTER — Encounter: Admission: RE | Payer: Self-pay | Source: Ambulatory Visit

## 2017-02-03 SURGERY — SIGMOIDOSCOPY, FLEXIBLE
Anesthesia: General

## 2017-02-04 NOTE — Discharge Instructions (Signed)
General Anesthesia, Adult, Care After °These instructions provide you with information about caring for yourself after your procedure. Your health care provider may also give you more specific instructions. Your treatment has been planned according to current medical practices, but problems sometimes occur. Call your health care provider if you have any problems or questions after your procedure. °What can I expect after the procedure? °After the procedure, it is common to have: °· Vomiting. °· A sore throat. °· Mental slowness. ° °It is common to feel: °· Nauseous. °· Cold or shivery. °· Sleepy. °· Tired. °· Sore or achy, even in parts of your body where you did not have surgery. ° °Follow these instructions at home: °For at least 24 hours after the procedure: °· Do not: °? Participate in activities where you could fall or become injured. °? Drive. °? Use heavy machinery. °? Drink alcohol. °? Take sleeping pills or medicines that cause drowsiness. °? Make important decisions or sign legal documents. °? Take care of children on your own. °· Rest. °Eating and drinking °· If you vomit, drink water, juice, or soup when you can drink without vomiting. °· Drink enough fluid to keep your urine clear or pale yellow. °· Make sure you have little or no nausea before eating solid foods. °· Follow the diet recommended by your health care provider. °General instructions °· Have a responsible adult stay with you until you are awake and alert. °· Return to your normal activities as told by your health care provider. Ask your health care provider what activities are safe for you. °· Take over-the-counter and prescription medicines only as told by your health care provider. °· If you smoke, do not smoke without supervision. °· Keep all follow-up visits as told by your health care provider. This is important. °Contact a health care provider if: °· You continue to have nausea or vomiting at home, and medicines are not helpful. °· You  cannot drink fluids or start eating again. °· You cannot urinate after 8-12 hours. °· You develop a skin rash. °· You have fever. °· You have increasing redness at the site of your procedure. °Get help right away if: °· You have difficulty breathing. °· You have chest pain. °· You have unexpected bleeding. °· You feel that you are having a life-threatening or urgent problem. °This information is not intended to replace advice given to you by your health care provider. Make sure you discuss any questions you have with your health care provider. °Document Released: 09/22/2000 Document Revised: 11/19/2015 Document Reviewed: 05/31/2015 °Elsevier Interactive Patient Education © 2018 [employer] Inc. ° °

## 2017-02-05 ENCOUNTER — Encounter
Admission: RE | Disposition: A | Discharge status: Home / Self Care | Payer: Self-pay | Source: Ambulatory Visit | Attending: Gastroenterology

## 2017-02-05 ENCOUNTER — Ambulatory Visit
Admission: RE | Admit: 2017-02-05 | Discharge: 2017-02-05 | Disposition: A | Discharge status: Home / Self Care | Payer: Medicaid Other | Source: Ambulatory Visit | Attending: Gastroenterology | Admitting: Gastroenterology

## 2017-02-05 DIAGNOSIS — Z7982 Long term (current) use of aspirin: Secondary | ICD-10-CM | POA: Diagnosis not present

## 2017-02-05 DIAGNOSIS — E119 Type 2 diabetes mellitus without complications: Secondary | ICD-10-CM | POA: Insufficient documentation

## 2017-02-05 DIAGNOSIS — N400 Enlarged prostate without lower urinary tract symptoms: Secondary | ICD-10-CM | POA: Diagnosis not present

## 2017-02-05 DIAGNOSIS — Z794 Long term (current) use of insulin: Secondary | ICD-10-CM | POA: Insufficient documentation

## 2017-02-05 DIAGNOSIS — F17210 Nicotine dependence, cigarettes, uncomplicated: Secondary | ICD-10-CM | POA: Diagnosis not present

## 2017-02-05 DIAGNOSIS — K219 Gastro-esophageal reflux disease without esophagitis: Secondary | ICD-10-CM | POA: Diagnosis not present

## 2017-02-05 DIAGNOSIS — Z79899 Other long term (current) drug therapy: Secondary | ICD-10-CM | POA: Diagnosis not present

## 2017-02-05 DIAGNOSIS — C189 Malignant neoplasm of colon, unspecified: Secondary | ICD-10-CM

## 2017-02-05 DIAGNOSIS — K859 Acute pancreatitis without necrosis or infection, unspecified: Secondary | ICD-10-CM | POA: Insufficient documentation

## 2017-02-05 DIAGNOSIS — C187 Malignant neoplasm of sigmoid colon: Secondary | ICD-10-CM | POA: Insufficient documentation

## 2017-02-05 HISTORY — PX: FLEXIBLE SIGMOIDOSCOPY: SHX5431

## 2017-02-05 HISTORY — DX: Malignant neoplasm of sigmoid colon: C18.7

## 2017-02-05 LAB — GLUCOSE, CAPILLARY: Glucose-Capillary: 442 mg/dL — ABNORMAL HIGH (ref 65–99)

## 2017-02-05 SURGERY — SIGMOIDOSCOPY, FLEXIBLE
Anesthesia: Choice | Wound class: Contaminated

## 2017-02-05 MED ORDER — SODIUM CHLORIDE 0.9 % IV SOLN: INTRAVENOUS | Status: DC

## 2017-02-05 MED ORDER — STERILE WATER FOR IRRIGATION IR SOLN
Status: DC | PRN
  Administered 2017-02-05: 08:00:00

## 2017-02-05 SURGICAL SUPPLY — 5 items
CANISTER SUCT 1200ML W/VALVE (MISCELLANEOUS) ×2 IMPLANT
GOWN CVR UNV OPN BCK APRN NK (MISCELLANEOUS) IMPLANT
GOWN ISOL THUMB LOOP REG UNIV (MISCELLANEOUS) ×3
KIT ENDO PROCEDURE OLY (KITS) ×2 IMPLANT
WATER STERILE IRR 250ML POUR (IV SOLUTION) ×2 IMPLANT

## 2017-02-05 NOTE — H&P (Signed)
Lucilla Lame, MD [address] [place], [place] [address] Phone:[phone] Fax : [phone]  Primary Care Physician:  [name], MD Primary Gastroenterologist:  Dr. Allen Norris  Pre-Procedure History & Physical: HPI:  [name] is a 56 y.o. male is here for an flexible sigmoidoscopy.   Past Medical History:  Diagnosis Date  . Anemia   . BPH without obstruction/lower urinary tract symptoms   . Cancer ([place])   . Cellulitis of scrotum   . Cyst of prostate 05/13/2015  . Diabetes ([place])   . Difficulty urinating   . Dyspnea   . Epididymoorchitis   . ETOH abuse   . Fracture of right tibia and fibula   . GERD (gastroesophageal reflux disease)   . GI bleed   . Malignant neoplasm of sigmoid colon ([place])   . Pancreatitis   . Trichomoniasis   . Wears dentures    Partial upper and lower.  reports poor fit.    Past Surgical History:  Procedure Laterality Date  . COLONOSCOPY WITH PROPOFOL N/A 12/02/2016   Procedure: COLONOSCOPY WITH PROPOFOL;  Surgeon: Lucilla Lame, MD;  Location: [facility] ENDOSCOPY;  Service: Endoscopy;  Laterality: N/A;  . ESOPHAGOGASTRODUODENOSCOPY (EGD) WITH PROPOFOL N/A 12/02/2016   Procedure: ESOPHAGOGASTRODUODENOSCOPY (EGD) WITH PROPOFOL;  Surgeon: Lucilla Lame, MD;  Location: [facility] ENDOSCOPY;  Service: Endoscopy;  Laterality: N/A;  . FRACTURE SURGERY     TIBIA AND FIBULA  . FRACTURE SURGERY    . resection of pancreas    . SCROTAL EXPLORATION      Prior to Admission medications   Medication Sig Start Date End Date Taking? Authorizing Provider  B-D UF III MINI PEN NEEDLES 31G X 5 MM MISC  10/17/16  Yes [provider]  LANTUS SOLOSTAR 100 UNIT/ML Solostar Pen Inject 10 Units into the skin 2 (two) times daily. Patient taking differently: Inject 10 Units into the skin daily at 10 pm.  07/02/16  Yes Paduchowski, Lennette Bihari, MD  NOVOLOG FLEXPEN 100 UNIT/ML FlexPen Inject 48 Units into the skin 3 (three) times daily with meals.  05/16/15  Yes  [provider]  Oxycodone HCl 10 MG TABS Take 1 tablet (10 mg total) by mouth every 6 (six) hours as needed. 01/29/17  Yes Sindy Guadeloupe, MD  Pancrelipase, Lip-Prot-Amyl, 24000-76000 units CPEP Take 1 capsule (24,000 Units total) by mouth 3 (three) times daily. 01/23/17  Yes Earlie Server, MD    Allergies as of 02/02/2017 - Review Complete 02/02/2017  Allergen Reaction Noted  . Aspirin Itching and Other (See Comments) 08/11/2016    Family History  Problem Relation Age of Onset  . Cancer - Colon Father   . Colon cancer Brother     Social History   Social History  . Marital status: Single    Spouse name: N/A  . Number of children: N/A  . Years of education: N/A   Occupational History  . Not on file.   Social History Main Topics  . Smoking status: Current Every Day Smoker    Packs/day: 0.50    Years: 41.00    Types: Cigarettes  . Smokeless tobacco: Never Used     Comment: since age 79  . Alcohol use 0.0 oz/week     Comment: occasionally - but says not drinkning currently  . Drug use: No  . Sexual activity: Not on file   Other Topics Concern  . Not on file   Social History Narrative  . No narrative on file  Review of Systems: See HPI, otherwise negative ROS  Physical Exam: BP 102/74   Pulse 82   Temp 97.7 F (36.5 C) (Temporal)   Resp 17   Ht 5\' 8"  (1.727 m)   Wt 112 lb (50.8 kg)   SpO2 100%   BMI 17.03 kg/m  General:   Alert,  pleasant and cooperative in NAD Head:  Normocephalic and atraumatic. Neck:  Supple; no masses or thyromegaly. Lungs:  Clear throughout to auscultation.    Heart:  Regular rate and rhythm. Abdomen:  Soft, nontender and nondistended. Normal bowel sounds, without guarding, and without rebound.   Neurologic:  Alert and  oriented x4;  grossly normal neurologically.  Impression/Plan: MAXIMUS HOFFERT is here for an flexible sigmoidoscopy to be performed for sigmoid cancer  Risks, benefits, limitations, and alternatives regarding   flexible sigmoidoscopy have been reviewed with the patient.  Questions have been answered.  All parties agreeable.   Lucilla Lame, MD  02/05/2017, 7:33 AM

## 2017-02-05 NOTE — Progress Notes (Signed)
3568 Vital signs: B/P 120/81; HR 71; SpO2 100%; RR 16 0755 B/P 125/74; HR 69; SpO2 100%; RR 16.   Pt tolerated procedure well.

## 2017-02-05 NOTE — Op Note (Signed)
[facility] Gastroenterology Patient Name: [name] Procedure Date: 02/05/2017 7:44 AM MRN: [id number] Account #: [id number] Date of Birth: 09-15-1960 Admit Type: Outpatient Age: 56 Room: [facility] OR ROOM 01 Gender: Male Note Status: Finalized Procedure:            Flexible Sigmoidoscopy Providers:            Lucilla Lame MD, MD Referring MD:         Jules Husbands (Referring MD) Medicines:            None Complications:        No immediate complications. Procedure:            Pre-Anesthesia Assessment:                       - Prior to the procedure, a History and Physical was                        performed, and patient medications and allergies were                        reviewed. The patient's tolerance of previous                        anesthesia was also reviewed. The risks and benefits of                        the procedure and the sedation options and risks were                        discussed with the patient. All questions were                        answered, and informed consent was obtained. Prior                        Anticoagulants: The patient has taken no previous                        anticoagulant or antiplatelet agents. ASA Grade                        Assessment: II - A patient with mild systemic disease.                        After reviewing the risks and benefits, the patient was                        deemed in satisfactory condition to undergo the                        procedure.                       After obtaining informed consent, the scope was passed                        under direct vision. The Olympus CF-HQ190L Colonoscope                        (S#. [id number]) was introduced  through the anus and                        advanced to the the sigmoid colon. The flexible                        sigmoidoscopy was accomplished without difficulty. The                        patient tolerated the procedure well. The quality of                the bowel preparation was good. Findings:      The perianal and digital rectal examinations were normal.      A non-obstructing medium-sized mass was found in the sigmoid colon. The       mass was non-circumferential. No bleeding was present. 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                       - No specimens collected.                       - The lesion in the rectum is no longer there. The mass                        in the sigmoid was seen as was the tattooed area. Recommendation:       - Return to referring physician. Procedure Code(s):    --- Professional ---                       [phone], Sigmoidoscopy, flexible; diagnostic, including                        collection of specimen(s) by brushing or washing, when                        performed (separate procedure) Diagnosis Code(s):    --- Professional ---                       C18.7, Malignant neoplasm of sigmoid colon CPT copyright 2016 American Medical Association. All rights reserved. The codes documented in this report are preliminary and upon coder review may  be revised to meet current compliance requirements. Lucilla Lame MD, MD 02/05/2017 7:59:19 AM This report has been signed electronically. Number of Addenda: 0 Note Initiated On: 02/05/2017 7:44 AM      [facility]

## 2017-02-07 ENCOUNTER — Encounter: Payer: Self-pay | Admitting: Gastroenterology

## 2017-02-09 ENCOUNTER — Other Ambulatory Visit: Payer: Self-pay

## 2017-02-09 DIAGNOSIS — C187 Malignant neoplasm of sigmoid colon: Secondary | ICD-10-CM

## 2017-02-11 ENCOUNTER — Telehealth: Payer: Self-pay

## 2017-02-11 ENCOUNTER — Ambulatory Visit (INDEPENDENT_AMBULATORY_CARE_PROVIDER_SITE_OTHER): Payer: Medicaid Other | Admitting: Surgery

## 2017-02-11 ENCOUNTER — Other Ambulatory Visit
Admission: RE | Admit: 2017-02-11 | Discharge: 2017-02-11 | Disposition: A | Discharge status: Home / Self Care | Payer: Medicaid Other | Source: Ambulatory Visit | Attending: Surgery | Admitting: Surgery

## 2017-02-11 ENCOUNTER — Encounter: Payer: Self-pay | Admitting: Surgery

## 2017-02-11 VITALS — BP 109/75 | HR 81 | Temp 97.3°F | Ht 68.0 in | Wt 113.4 lb

## 2017-02-11 DIAGNOSIS — Z09 Encounter for follow-up examination after completed treatment for conditions other than malignant neoplasm: Secondary | ICD-10-CM

## 2017-02-11 DIAGNOSIS — C187 Malignant neoplasm of sigmoid colon: Secondary | ICD-10-CM | POA: Insufficient documentation

## 2017-02-11 LAB — COMPREHENSIVE METABOLIC PANEL
ALBUMIN: 3.2 g/dL — AB (ref 3.5–5.0)
ALK PHOS: 104 U/L (ref 38–126)
ALT: 54 U/L (ref 17–63)
AST: 62 U/L — AB (ref 15–41)
Anion gap: 10 (ref 5–15)
BILIRUBIN TOTAL: 1 mg/dL (ref 0.3–1.2)
BUN: 15 mg/dL (ref 6–20)
CALCIUM: 8.6 mg/dL — AB (ref 8.9–10.3)
CO2: 22 mmol/L (ref 22–32)
Chloride: 98 mmol/L — ABNORMAL LOW (ref 101–111)
Creatinine, Ser: 1.15 mg/dL (ref 0.61–1.24)
GFR calc Af Amer: >60 mL/min (ref >60)
GFR calc non Af Amer: >60 mL/min (ref >60)
GLUCOSE: 722 mg/dL — AB (ref 65–99)
Potassium: 4.2 mmol/L (ref 3.5–5.1)
Sodium: 130 mmol/L — ABNORMAL LOW (ref 135–145)
TOTAL PROTEIN: 6.4 g/dL — AB (ref 6.5–8.1)

## 2017-02-11 LAB — CBC WITH DIFFERENTIAL/PLATELET
Basophils Absolute: 0 10*3/uL (ref 0–0.1)
Basophils Relative: 0 %
Eosinophils Absolute: 0.1 10*3/uL (ref 0–0.7)
Eosinophils Relative: 1 %
HCT: 32.4 % — ABNORMAL LOW (ref 40.0–52.0)
Hemoglobin: 10.8 g/dL — ABNORMAL LOW (ref 13.0–18.0)
Lymphocytes Relative: 27 %
Lymphs Abs: 1.3 10*3/uL (ref 1.0–3.6)
MCH: 32.5 pg (ref 26.0–34.0)
MCHC: 33.3 g/dL (ref 32.0–36.0)
MCV: 97.7 fL (ref 80.0–100.0)
Monocytes Absolute: 0.2 10*3/uL (ref 0.2–1.0)
Monocytes Relative: 5 %
Neutro Abs: 3.2 10*3/uL (ref 1.4–6.5)
Neutrophils Relative %: 67 %
Platelets: 122 10*3/uL — ABNORMAL LOW (ref 150–440)
RBC: 3.32 MIL/uL — ABNORMAL LOW (ref 4.40–5.90)
RDW: 14.7 % — ABNORMAL HIGH (ref 11.5–14.5)
WBC: 4.8 10*3/uL (ref 3.8–10.6)

## 2017-02-11 LAB — APTT: aPTT: 30 seconds (ref 24–36)

## 2017-02-11 LAB — PROTIME-INR
INR: 1.15
Prothrombin Time: 14.8 seconds (ref 11.4–15.2)

## 2017-02-11 NOTE — Progress Notes (Signed)
Compliance issues. Patient came in about an hour and a half late and left the office. un able to examine the patient We will have our office contact him He will likely need to be seen by Dr. Rosana Hoes who has been the surgeon last seen him in the past

## 2017-02-11 NOTE — Telephone Encounter (Signed)
[name] called [facility] with critical condition of glucose of 722. Results verbally given to [name] at this time.   Patient was due to be seen in office today. He showed up a hour and a half late due to not going to get his lab work done prior to his visit. Patient checked in at 11:21AM with his appointment being at 10:30AM. [name] was still willing to see him although he is [name]' patient however when entering the room patient had left the office.   Call made to patient at this time. After noticing patient had left the office. Phone rang once and a message was left asking why he had left the office and asking to call back to reschedule appointment with our office. I will attempt to call patient later today to see if he wants to come back in today to be seen or to have him schedule another appointment.

## 2017-02-11 NOTE — Telephone Encounter (Signed)
Patient returned call and informed me that he had left due to having a rough morning. He said that he has to be back at the hospital tomorrow for an appointment at 1:45PM I told him to come over to our office to be seen by Dr. Rosana Hoes after he finishes with that appointment. Patient verbalized understanding at this time and appointment has been made.   Spoke with Dr. Rosana Hoes and informed him of the situation that happened in office today with the patient and that the patient called back wanting to be seen tomorrow. Dr. Rosana Hoes agreed that he would see the patient in office tomorrow if no emergency came up around the time of scheduled appointment.

## 2017-02-12 ENCOUNTER — Inpatient Hospital Stay (HOSPITAL_BASED_OUTPATIENT_CLINIC_OR_DEPARTMENT_OTHER): Payer: Medicaid Other | Admitting: Oncology

## 2017-02-12 ENCOUNTER — Inpatient Hospital Stay: Payer: Medicaid Other

## 2017-02-12 ENCOUNTER — Encounter: Payer: Self-pay | Admitting: Surgery

## 2017-02-12 ENCOUNTER — Ambulatory Visit (INDEPENDENT_AMBULATORY_CARE_PROVIDER_SITE_OTHER): Payer: Medicaid Other | Admitting: Surgery

## 2017-02-12 ENCOUNTER — Encounter: Payer: Self-pay | Admitting: Oncology

## 2017-02-12 VITALS — BP 87/60 | HR 80 | Temp 97.8°F | Ht 68.0 in | Wt 113.0 lb

## 2017-02-12 DIAGNOSIS — F17210 Nicotine dependence, cigarettes, uncomplicated: Secondary | ICD-10-CM | POA: Diagnosis not present

## 2017-02-12 DIAGNOSIS — Z9119 Patient's noncompliance with other medical treatment and regimen: Secondary | ICD-10-CM

## 2017-02-12 DIAGNOSIS — F17200 Nicotine dependence, unspecified, uncomplicated: Secondary | ICD-10-CM | POA: Diagnosis not present

## 2017-02-12 DIAGNOSIS — F1010 Alcohol abuse, uncomplicated: Secondary | ICD-10-CM

## 2017-02-12 DIAGNOSIS — R109 Unspecified abdominal pain: Secondary | ICD-10-CM

## 2017-02-12 DIAGNOSIS — C187 Malignant neoplasm of sigmoid colon: Secondary | ICD-10-CM

## 2017-02-12 DIAGNOSIS — E1165 Type 2 diabetes mellitus with hyperglycemia: Secondary | ICD-10-CM | POA: Diagnosis not present

## 2017-02-12 DIAGNOSIS — Z91199 Patient's noncompliance with other medical treatment and regimen due to unspecified reason: Secondary | ICD-10-CM

## 2017-02-12 DIAGNOSIS — D125 Benign neoplasm of sigmoid colon: Secondary | ICD-10-CM

## 2017-02-12 DIAGNOSIS — Z794 Long term (current) use of insulin: Secondary | ICD-10-CM

## 2017-02-12 DIAGNOSIS — Z79899 Other long term (current) drug therapy: Secondary | ICD-10-CM

## 2017-02-12 DIAGNOSIS — E440 Moderate protein-calorie malnutrition: Secondary | ICD-10-CM

## 2017-02-12 DIAGNOSIS — G8929 Other chronic pain: Secondary | ICD-10-CM

## 2017-02-12 DIAGNOSIS — K8689 Other specified diseases of pancreas: Secondary | ICD-10-CM | POA: Insufficient documentation

## 2017-02-12 DIAGNOSIS — K529 Noninfective gastroenteritis and colitis, unspecified: Secondary | ICD-10-CM

## 2017-02-12 MED ORDER — OXYCODONE HCL 10 MG PO TABS: 10.0000 mg | ORAL_TABLET | Freq: Four times a day (QID) | ORAL | 0 refills | Status: DC | PRN

## 2017-02-12 MED ORDER — PANCRELIPASE (LIP-PROT-AMYL) 36000-114000 UNITS PO CPEP: 36000.0000 [IU] | ORAL_CAPSULE | Freq: Three times a day (TID) | ORAL | 3 refills | Status: DC

## 2017-02-12 NOTE — Progress Notes (Signed)
[name] Cancer Initial Visit:  Patient Care Team: Hyman Hopes, MD as PCP - General (Internal Medicine) Benita Gutter, RN as Registered Nurse  CHIEF COMPLAINTS/PURPOSE OF CONSULTATION: Abdominal pain. Recently diagnosed colon cancer  HISTORY OF PRESENTING ILLNESS: [name] 56 y.o. male with PMH listed below is referred by Dr.Davis to me for evaluation of recently diagnosed colorectal cancer. Patient reports that he has been having significant weight loss, abdominal pain, persistent diarrhea for past few months. And more recently he lost 15 pounds in the past 6 weeks.  He had GI work up including EGD and colonoscopy in June 2018 which revealed non obstructing large mass in sigmoid colon, several colon polyps, one rectal polyp and a large sessile mass in cardia.  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 Rectal polyp showed small fragments of adenocarcinoma.  11/30/2016 CT abdomen and pelvis with contrast showed no evidence of metastatic disease.  11/30/2016 CT head showed no acute intracranial abnormality Due to non compliancy, he has not done any advised follow up or work up till now. He was seen by Dr.Davis on 01/15/2017.  Today he reports abdominal pain which he rates 9 out of 10. He requests to get pain medication. He used to follow up with a pain clinic and due to noncompliance with appointment, he was discharged from there and he reports not having any pain medication and has tried tylenol with no relief of his pain.  Patient otherwise denies any fever/chills, CP, or any worsening of his chronic SOB. He denies drinking alcohol, however per previous note, his family members reported that he continues to drink 5-6 beers a day and more excessively on weekends. Today his girlfriend drop him to  cancer center and left. Patient has not get the [place] lower extremity doppler done which was ordered by Dr.Davis yesterday due to the concern of lower extremity tenderness and swelling.  Patient has significant family history including father died at age of 63 with colon cancer and his brother died at age of age of 39 due to colon cancer. Per patient, they did not get genetic testing.  Patient is single, lives with his mother who is 38 years old. He has total 4 children, youngest kid is 51 years old who does not live with him.   Interval history Today patient is present to the clinic for follow-up visit. Patient continued to have diarrhea, and upper and lower abdominal pain. Marland Kitchen He takes oxycodone 10 mg every 6 hours as needed and he reports pain is better controlled.  He tried the generic Creon pills three times after each meals and feels that it helps with his bloating and diarrhea slightly   He met dietitian last time he was here. He takes nutrition supplement. Today his weight is stable.  Review of Systems  Constitutional: Positive for fatigue.  HENT:  Negative.   Eyes: Negative.   Respiratory: Negative.   Cardiovascular: Positive for leg swelling.  Gastrointestinal: Positive for abdominal distention, abdominal pain and diarrhea.  Endocrine: Negative.   Genitourinary: Negative.    Musculoskeletal: Negative.   Skin: Negative.   Neurological: Negative.   Hematological: Negative.   Psychiatric/Behavioral: Negative.     MEDICAL HISTORY: Past Medical History:  Diagnosis Date  . Anemia   . BPH without obstruction/lower urinary tract symptoms   .  Cancer ([place])   . Cellulitis of scrotum   . Cyst of prostate 05/13/2015  . Diabetes ([place])   . Difficulty urinating   . Dyspnea   . Epididymoorchitis   . ETOH abuse   . Fracture of right tibia and fibula   . GERD (gastroesophageal reflux disease)   . GI bleed   . Malignant neoplasm of sigmoid colon ([place])   . Pancreatitis   . Trichomoniasis    . Wears dentures    Partial upper and lower.  reports poor fit.    SURGICAL HISTORY: Past Surgical History:  Procedure Laterality Date  . COLONOSCOPY WITH PROPOFOL N/A 12/02/2016   Procedure: COLONOSCOPY WITH PROPOFOL;  Surgeon: Lucilla Lame, MD;  Location: [facility] ENDOSCOPY;  Service: Endoscopy;  Laterality: N/A;  . ESOPHAGOGASTRODUODENOSCOPY (EGD) WITH PROPOFOL N/A 12/02/2016   Procedure: ESOPHAGOGASTRODUODENOSCOPY (EGD) WITH PROPOFOL;  Surgeon: Lucilla Lame, MD;  Location: [facility] ENDOSCOPY;  Service: Endoscopy;  Laterality: N/A;  . FLEXIBLE SIGMOIDOSCOPY N/A 02/05/2017   Procedure: FLEXIBLE SIGMOIDOSCOPY;  Surgeon: Lucilla Lame, MD;  Location: [place];  Service: Gastroenterology;  Laterality: N/A;  Needs labs drawn. Needs to come in early.  No anesthesia  . FRACTURE SURGERY     TIBIA AND FIBULA  . FRACTURE SURGERY    . resection of pancreas    . SCROTAL EXPLORATION      SOCIAL HISTORY: Social History   Social History  . Marital status: Single    Spouse name: N/A  . Number of children: N/A  . Years of education: N/A   Occupational History  . Not on file.   Social History Main Topics  . Smoking status: Current Every Day Smoker    Packs/day: 0.25    Years: 41.00    Types: Cigarettes  . Smokeless tobacco: Never Used     Comment: since age 65  . Alcohol use 0.0 oz/week     Comment: occasionally - but says not drinkning currently  . Drug use: No  . Sexual activity: Not on file   Other Topics Concern  . Not on file   Social History Narrative  . No narrative on file    FAMILY HISTORY Family History  Problem Relation Age of Onset  . Cancer - Colon Father   . Colon cancer Brother     ALLERGIES:  is allergic to aspirin.  MEDICATIONS:  Current Outpatient Prescriptions  Medication Sig Dispense Refill  . B-D UF III MINI PEN NEEDLES 31G X 5 MM MISC   1  . LANTUS SOLOSTAR 100 UNIT/ML Solostar Pen Inject 10 Units into the skin 2 (two) times daily. (Patient taking  differently: Inject 10 Units into the skin daily at 10 pm. ) 15 mL 1  . NOVOLOG FLEXPEN 100 UNIT/ML FlexPen Inject 48 Units into the skin 3 (three) times daily with meals.   1  . Oxycodone HCl 10 MG TABS Take 1 tablet (10 mg total) by mouth every 6 (six) hours as needed. 60 tablet 0  . lipase/protease/amylase (CREON) 36000 UNITS CPEP capsule Take 1 capsule (36,000 Units total) by mouth 3 (three) times daily before meals. 180 capsule 3   No current facility-administered medications for this visit.     PHYSICAL EXAMINATION:  ECOG PERFORMANCE STATUS: 1 - Symptomatic but completely ambulatory   Vitals:   02/12/17 1359  BP: 96/63  Pulse: 78  Resp: 16  Temp: (!) 96.1 F (35.6 C)    Filed Weights   02/12/17 1359  Weight: 113 lb 3 oz (  51.3 kg)     Physical Exam GENERAL: mild distress, cachectic  SKIN:  No rashes or significant lesions  HEAD: Normocephalic, No masses, lesions, tenderness or abnormalities  EYES: Icteric sclera.  ENT: External ears normal ,lips , buccal mucosa, and tongue normal and mucous membranes are moist  LYMPH: No palpable lymphadenopathy  LUNGS: Clear to auscultation, no crackles or wheezes HEART: Regular rate & rhythm, no murmurs, no gallops, S1 normal and S2 normal  ABDOMEN: Abdomen soft, general tenderness, normal bowel sounds MSK: No CVA tenderness and no tenderness on percussion of the back or rib cage. EXTREMITIES: No edema, no skin discoloration or tenderness NEURO: Alert & oriented, no focal motor/sensory deficits.   LABORATORY DATA: I have personally reviewed the data as listed:  Hospital Outpatient Visit on 02/11/2017  Component Date Value Ref Range Status  . WBC 02/11/2017 4.8  3.8 - 10.6 K/uL Final  . RBC 02/11/2017 3.32* 4.40 - 5.90 MIL/uL Final  . Hemoglobin 02/11/2017 10.8* 13.0 - 18.0 g/dL Final  . HCT 02/11/2017 32.4* 40.0 - 52.0 % Final  . MCV 02/11/2017 97.7  80.0 - 100.0 fL Final  . MCH 02/11/2017 32.5  26.0 - 34.0 pg Final  .  MCHC 02/11/2017 33.3  32.0 - 36.0 g/dL Final  . RDW 02/11/2017 14.7* 11.5 - 14.5 % Final  . Platelets 02/11/2017 122* 150 - 440 K/uL Final  . Neutrophils Relative % 02/11/2017 67  % Final  . Neutro Abs 02/11/2017 3.2  1.4 - 6.5 K/uL Final  . Lymphocytes Relative 02/11/2017 27  % Final  . Lymphs Abs 02/11/2017 1.3  1.0 - 3.6 K/uL Final  . Monocytes Relative 02/11/2017 5  % Final  . Monocytes Absolute 02/11/2017 0.2  0.2 - 1.0 K/uL Final  . Eosinophils Relative 02/11/2017 1  % Final  . Eosinophils Absolute 02/11/2017 0.1  0 - 0.7 K/uL Final  . Basophils Relative 02/11/2017 0  % Final  . Basophils Absolute 02/11/2017 0.0  0 - 0.1 K/uL Final  . Sodium 02/11/2017 130* 135 - 145 mmol/L Final  . Potassium 02/11/2017 4.2  3.5 - 5.1 mmol/L Final  . Chloride 02/11/2017 98* 101 - 111 mmol/L Final  . CO2 02/11/2017 22  22 - 32 mmol/L Final  . Glucose, Bld 02/11/2017 722* 65 - 99 mg/dL Final   Comment: CRITICAL RESULT CALLED TO, READ BACK BY AND VERIFIED WITH CHANEL PRICE AT 1962 ON 02/11/17 [place].   . BUN 02/11/2017 15  6 - 20 mg/dL Final  . Creatinine, Ser 02/11/2017 1.15  0.61 - 1.24 mg/dL Final  . Calcium 02/11/2017 8.6* 8.9 - 10.3 mg/dL Final  . Total Protein 02/11/2017 6.4* 6.5 - 8.1 g/dL Final  . Albumin 02/11/2017 3.2* 3.5 - 5.0 g/dL Final  . AST 02/11/2017 62* 15 - 41 U/L Final  . ALT 02/11/2017 54  17 - 63 U/L Final  . Alkaline Phosphatase 02/11/2017 104  38 - 126 U/L Final  . Total Bilirubin 02/11/2017 1.0  0.3 - 1.2 mg/dL Final  . GFR calc non Af Amer 02/11/2017 >60  >60 mL/min Final  . GFR calc Af Amer 02/11/2017 >60  >60 mL/min Final   Comment: (NOTE) The eGFR has been calculated using the CKD EPI equation. This calculation has not been validated in all clinical situations. eGFR's persistently <60 mL/min signify possible Chronic Kidney Disease.   . Anion gap 02/11/2017 10  5 - 15 Final  . Prothrombin Time 02/11/2017 14.8  11.4 - 15.2 seconds Final  .  INR 02/11/2017 1.15   Final   . aPTT 02/11/2017 30  24 - 36 seconds Final  Admission on 02/05/2017, Discharged on 02/05/2017  Component Date Value Ref Range Status  . Glucose-Capillary 02/05/2017 442* 65 - 99 mg/dL Final  Appointment on 01/23/2017  Component Date Value Ref Range Status  . Sodium 01/23/2017 128* 135 - 145 mmol/L Final  . Potassium 01/23/2017 4.1  3.5 - 5.1 mmol/L Final  . Chloride 01/23/2017 97* 101 - 111 mmol/L Final  . CO2 01/23/2017 22  22 - 32 mmol/L Final  . Glucose, Bld 01/23/2017 536* 65 - 99 mg/dL Final   Comment: RESULTS VERIFIED BY REPEAT TESTING CRITICAL RESULT CALLED TO, READ BACK BY AND VERIFIED WITH STACEY MCDONALD @ 11:52 AM 01/23/2017 [facility]   . BUN 01/23/2017 15  6 - 20 mg/dL Final  . Creatinine, Ser 01/23/2017 1.16  0.61 - 1.24 mg/dL Final  . Calcium 01/23/2017 8.9  8.9 - 10.3 mg/dL Final  . Total Protein 01/23/2017 6.4* 6.5 - 8.1 g/dL Final  . Albumin 01/23/2017 3.0* 3.5 - 5.0 g/dL Final  . AST 01/23/2017 56* 15 - 41 U/L Final  . ALT 01/23/2017 46  17 - 63 U/L Final  . Alkaline Phosphatase 01/23/2017 123  38 - 126 U/L Final  . Total Bilirubin 01/23/2017 1.2  0.3 - 1.2 mg/dL Final  . GFR calc non Af Amer 01/23/2017 >60  >60 mL/min Final  . GFR calc Af Amer 01/23/2017 >60  >60 mL/min Final   Comment: (NOTE) The eGFR has been calculated using the CKD EPI equation. This calculation has not been validated in all clinical situations. eGFR's persistently <60 mL/min signify possible Chronic Kidney Disease.   . Anion gap 01/23/2017 9  5 - 15 Final  . Hepatitis B Surface Ag 01/23/2017 Negative  Negative Final  . HCV Ab 01/23/2017 0.1  0.0 - 0.9 s/co ratio Final   Comment: (NOTE)                                  Negative:     < 0.8                             Indeterminate: 0.8 - 0.9                                  Positive:     > 0.9 The CDC recommends that a positive HCV antibody result be followed up with a HCV Nucleic Acid Amplification test ([id number]). Performed At: [facility] [place], [place] [id number] Lindon Romp MD QQ:[id number]   . Hep A IgM 01/23/2017 Negative  Negative Final  . Hep B C IgM 01/23/2017 Negative  Negative Final  . Folate 01/23/2017 15.3  >5.9 ng/mL Final  . Vitamin B-12 01/23/2017 427  180 - 914 pg/mL Final   Comment: (NOTE) This assay is not validated for testing neonatal or myeloproliferative syndrome specimens for Vitamin B12 levels. Performed at [facility], [place] [address] [place], [place] [address]   Clinical Support on 01/15/2017  Component Date Value Ref Range Status  . WBC 01/15/2017 3.6* 3.8 - 10.6 K/uL Final  . RBC 01/15/2017 3.39* 4.40 - 5.90 MIL/uL Final  . Hemoglobin 01/15/2017 11.2* 13.0 - 18.0 g/dL Final  .  HCT 01/15/2017 33.3* 40.0 - 52.0 % Final  . MCV 01/15/2017 98.0  80.0 - 100.0 fL Final  . MCH 01/15/2017 33.0  26.0 - 34.0 pg Final  . MCHC 01/15/2017 33.7  32.0 - 36.0 g/dL Final  . RDW 01/15/2017 14.3  11.5 - 14.5 % Final  . Platelets 01/15/2017 98* 150 - 440 K/uL Final  . Neutrophils Relative % 01/15/2017 67  % Final  . Neutro Abs 01/15/2017 2.4  1.4 - 6.5 K/uL Final  . Lymphocytes Relative 01/15/2017 25  % Final  . Lymphs Abs 01/15/2017 0.9* 1.0 - 3.6 K/uL Final  . Monocytes Relative 01/15/2017 6  % Final  . Monocytes Absolute 01/15/2017 0.2  0.2 - 1.0 K/uL Final  . Eosinophils Relative 01/15/2017 1  % Final  . Eosinophils Absolute 01/15/2017 0.0  0 - 0.7 K/uL Final  . Basophils Relative 01/15/2017 1  % Final  . Basophils Absolute 01/15/2017 0.0  0 - 0.1 K/uL Final  . Sodium 01/15/2017 126* 135 - 145 mmol/L Final  . Potassium 01/15/2017 4.4  3.5 - 5.1 mmol/L Final  . Chloride 01/15/2017 95* 101 - 111 mmol/L Final  . CO2 01/15/2017 25  22 - 32 mmol/L Final  . Glucose, Bld 01/15/2017 668  65 - 99 mg/dL Final   Comment: RESULT REPEATED AND VERIFIED CRITICAL RESULT CALLED TO, READ BACK BY AND VERIFIED WITH: GAVE RESULT TO STACY MCDONALD '@12'$ :55PM 09/15/16 [facility]   . BUN  01/15/2017 13  6 - 20 mg/dL Final  . Creatinine, Ser 01/15/2017 1.04  0.61 - 1.24 mg/dL Final  . Calcium 01/15/2017 9.1  8.9 - 10.3 mg/dL Final  . Total Protein 01/15/2017 7.1  6.5 - 8.1 g/dL Final  . Albumin 01/15/2017 3.3* 3.5 - 5.0 g/dL Final  . AST 01/15/2017 107* 15 - 41 U/L Final  . ALT 01/15/2017 74* 17 - 63 U/L Final  . Alkaline Phosphatase 01/15/2017 165* 38 - 126 U/L Final  . Total Bilirubin 01/15/2017 2.2* 0.3 - 1.2 mg/dL Final  . GFR calc non Af Amer 01/15/2017 >60  >60 mL/min Final  . GFR calc Af Amer 01/15/2017 >60  >60 mL/min Final   Comment: (NOTE) The eGFR has been calculated using the CKD EPI equation. This calculation has not been validated in all clinical situations. eGFR's persistently <60 mL/min signify possible Chronic Kidney Disease.   . Anion gap 01/15/2017 6  5 - 15 Final  . Prothrombin Time 01/15/2017 15.3* 11.4 - 15.2 seconds Final  . INR 01/15/2017 1.20   Final  . aPTT 01/15/2017 32  24 - 36 seconds Final  . CEA 01/15/2017 18.5* 0.0 - 4.7 ng/mL Final   Comment: (NOTE)       Roche ECLIA methodology       Nonsmokers  <3.9                                     Smokers     <5.6 Performed At: [facility] [place], [place] [id number] Lindon Romp MD MV:[id number]   . Prealbumin 01/15/2017 10.9* 18 - 38 mg/dL Final   Performed at [facility], [place] [address], [place], [place] [address]  . Bilirubin, Direct 01/15/2017 0.9* 0.1 - 0.5 mg/dL Final    RADIOGRAPHIC STUDIES: I have personally reviewed the radiological images as listed and agree with the findings in the report 01/15/2017 [place] bilateral lower extremity  doppler: No lower extremity DVT in either side.  11/30/2016: CT abdomen pelvis w contrast: Stable chronic changes as described above.Small gastrohepatic ligament lymph nodes slightly more prominent than that seen on the prior exam likely of a reactive nature. 11/30/2016: CT head wo contrast: Chronic stable small vessel  ischemic disease of periventricular white matter. No acute intracranial abnormality. 01/23/2017 CT chest abdomen pelvis w contrast 1. The sigmoid colon mass is difficult to perceive. No definite adenopathy in this vicinity. No liver metastatic disease is identified. 2. Chronically stable right hepatic lobe hemangioma. 3. I am suspicious of hepatic steatosis. 4. Mildly enlarged right gastric lymph node at 1.1 cm, previously 1.2 cm, significance uncertain. 5. Other imaging findings of potential clinical significance: 7 mm hypodense lesion in the right kidney upper pole is technically nonspecific although statistically likely to be a cyst. Descending colon diverticulosis. Aortic Atherosclerosis (ICD10-I70.0). Chronic crescentic hypodensity in the right prostate gland, possibly a cyst or some type of urethral diverticulum. Lower lumbar degenerative disc disease.   ASSESSMENT/PLAN   1. Moderate protein-calorie malnutrition ([place])   2. Malignant neoplasm of sigmoid colon ([place])   3. Type 2 diabetes mellitus with hyperglycemia, with long-term current use of insulin ([place])   4. Noncompliance   5. ETOH abuse   6. Pancreatic insufficiency   7. Chronic abdominal pain   8. Chronic diarrhea   .  Plan:  # Reviewed image results with patient. It appears that he does not have evidence of metastatic disease. His flutrataed LFTs are likely due to fatty liver disease, alcohol related.  Again I discussed in lengh and emphasize the importance of compliance of keeping doctor appointment, lab and image appointments. I informed patient that transportation service can be arranged to help him. Patient expresses his willingness to finish his work up. # I see him every 2 weeks to titrate his pain medication and palliate his digestion symptoms.   His compliance seems to be improving as he has showed up to every appointments. He showed up fo his appointment with GI and he has appoint with Dr.Davis today. Hopefully surgery  soon.   # Continue his pain medication, refilled oxycodone '10mg'$  Q6 hour PRN, dispense 60 pill.  # I titrate his Creon to 36,000 units TID to see if further helping with his bloating and diarrhea.   # He has a strong history of family history of colon cancer, needs genetic counseling. For now we will concentrate on getting him finish work up and start treatment/surgery. Will revisit this in the future.   # Advise patient to stop ETOH consumption. He reports that he has not been drinking lately. Smoke cessation discussed.  # Follow up with primary care provider for tighter control of DM.  All questions were answered. The patient knows to call the clinic with any problems, questions or concerns. Return visit in 2 week for evaluation of his pain and other symptom control  Dr. Earlie Server, MD, PhD [facility] at [facility] Pager- [phone] 02/12/2017

## 2017-02-12 NOTE — Patient Instructions (Addendum)
We will send the referral to [facility]. Someone will contact you with the appointment information. Please call our office if you have not heard from anyone by next Friday ( 02/20/17). Please call if you have questions or concerns.

## 2017-02-12 NOTE — Progress Notes (Signed)
Patient here today for follow up.   

## 2017-02-12 NOTE — Progress Notes (Signed)
Surgical Clinic Progress/Follow-up Note   HPI:  56 y.o. Male presents to clinic today for follow-up management of sigmoid colon adenocarcinoma diagnosed 2.5 months ago when he presented to [facility] ED for syncope in the context of GI bleeding and chronic diarrhea. At his prior appointment 1 month ago, patient had not yet completed the further workup advised and had lost 15 lbs over the prior 6 weeks in addition to the 30 lbs he'd reported having lost over the preceding 6 months.   Since patient's prior appointment 1 month ago, he has now completed his workup with flexible sigmoidoscopy demonstrating no additional rectal mass for biopsy (initial rectal polyp biopsy showed microscopic adenocarcinoma concerning for contamination from sigmoid adenocarcinoma biopsy). Re-staging CT and labs have also been completed, and patient now states that he wants to proceed with surgical management. Patient otherwise reports weight gain of "a few pounds" and unchanged loose BM's without blood per rectum, denies fever/chills, CP, or SOB. He also continues to deny any further alcohol consumption, though his fiance has previously refuted similar claims, so it is unclear which is true.  Review of Systems:  Constitutional: weight loss as per HPI, denies fever, chills, or sweats  Eyes: denies any other vision changes, history of eye injury  ENT: denies sore throat, hearing problems  Respiratory: denies shortness of breath, wheezing  Cardiovascular: denies chest pain, palpitations  Gastrointestinal: abdominal pain, N/V, and bowel function as per HPI Musculoskeletal: denies any other joint pains or cramps  Skin: Denies any other rashes or skin discolorations  Neurological: denies any other headache, dizziness, weakness  Psychiatric: denies any other depression, anxiety  All other review of systems: otherwise negative   Vital Signs:  BP (!) 87/60   Pulse 80   Temp 97.8 F (36.6 C) (Oral)   Ht 5\' 8"  (1.727 m)   Wt  113 lb (51.3 kg)   BMI 17.18 kg/m    Physical Exam:  Constitutional:  -- Thin body habitus  -- Awake, alert, and oriented x3  Eyes:  -- Pupils equally round and reactive to light  -- No scleral icterus  Ear, nose, throat:  -- No jugular venous distension  -- No nasal drainage, bleeding Pulmonary:  -- No crackles -- Equal breath sounds bilaterally -- Breathing non-labored at rest Cardiovascular:  -- S1, S2 present  -- No pericardial rubs  Gastrointestinal:  -- Soft, nontender, nondistended, no guarding/rebound  -- No abdominal masses appreciated, pulsatile or otherwise  Musculoskeletal / Integumentary:  -- Wounds or skin discoloration: None appreciated except well-healed post-surgical abdominal wound  -- Extremities: B/L UE and LE FROM, hands and feet warm, no edema  Neurologic:  -- Motor function: intact and symmetric  -- Sensation: intact and symmetric   Laboratory studies:  CBC Latest Ref Rng & Units 02/11/2017 01/15/2017 12/04/2016  WBC 3.8 - 10.6 K/uL 4.8 3.6(L) 7.1  Hemoglobin 13.0 - 18.0 g/dL 10.8(L) 11.2(L) 9.3(L)  Hematocrit 40.0 - 52.0 % 32.4(L) 33.3(L) 28.6(L)  Platelets 150 - 440 K/uL 122(L) 98(L) 107(L)   CMP Latest Ref Rng & Units 02/11/2017 01/23/2017 01/15/2017  Glucose 65 - 99 mg/dL 722(HH) 536(HH) 668  BUN 6 - 20 mg/dL 15 15 13   Creatinine 0.61 - 1.24 mg/dL 1.15 1.16 1.04  Sodium 135 - 145 mmol/L 130(L) 128(L) 126(L)  Potassium 3.5 - 5.1 mmol/L 4.2 4.1 4.4  Chloride 101 - 111 mmol/L 98(L) 97(L) 95(L)  CO2 22 - 32 mmol/L 22 22 25   Calcium 8.9 - 10.3 mg/dL 8.6(L) 8.9  9.1  Total Protein 6.5 - 8.1 g/dL 6.4(L) 6.4(L) 7.1  Total Bilirubin 0.3 - 1.2 mg/dL 1.0 1.2 2.2(H)  Alkaline Phos 38 - 126 U/L 104 123 165(H)  AST 15 - 41 U/L 62(H) 56(H) 107(H)  ALT 17 - 63 U/L 54 46 74(H)   Imaging:  CT Abdomen and Pelvis with Contrast (01/23/2017) - personally reviewed and discussed with patient Hepatobiliary: Contracted gallbladder. 1.3 cm hemangioma posteriorly in the  right hepatic lobe on image 54/2, no change compared to prior exams including 3/ 14/2014. I am suspicious for diffuse hepatic steatosis. No additional focal liver lesion is identified. No biliary dilatation noted.  Pancreas: Prominent and dense pancreatic calcifications with pancreatic parenchymal atrophy compatible with chronic calcific pancreatitis, not appreciably changed from prior exams.  Adrenals/Urinary Tract: Nonspecific hypodense 7 mm lesion anteriorly in the right kidney upper pole, stable and statistically likely to represent a cyst but technically nonspecific due to small size. Adrenal glands normal. Mild scarring posteriorly in the right kidney upper pole.  Stomach/Bowel: Descending colon diverticulosis. Postoperative findings along bowel in the left upper quadrant. The sigmoid colon  mass described on the colonoscopy from 12/02/2016 is difficult to  perceive on today's CT scan. No obvious adjacent adenopathy.  CT Chest with Contrast (01/23/2017) Cardiovascular: Unremarkable  Mediastinum/Nodes: Left axillary node measures 0.9 cm in short axis on image 15/2. No pathologic mediastinal or hilar adenopathy.  Lungs/Pleura: Unremarkable  Musculoskeletal: Deformities from prior left posterolateral rib fractures, healed. There is also a remote healed right posterolateral sixth rib fracture.   Assessment:  56 y.o. yo Male with a problem list including...  Patient Active Problem List   Diagnosis Date Noted  . Moderate malnutrition ([place]) 02/12/2017  . Pancreatic insufficiency 02/12/2017  . Chronic abdominal pain 01/29/2017  . Malignant neoplasm of sigmoid colon ([place]) 01/15/2017  . Shock ([place]) 12/02/2016  . Acute posthemorrhagic anemia   . Benign neoplasm of ascending colon   . Colorectal polyps   . Noninfectious diarrhea   . Syncope 12/01/2016  . Chronic diarrhea 12/01/2016  . Pancreatitis 04/17/2016  . Noncompliance 07/05/2015  . Tobacco dependence 07/05/2015   . Fracture of right tibia and fibula 06/05/2015  . Type 2 diabetes mellitus with hyperglycemia ([place]) 06/05/2015  . Prostatic cyst 05/13/2015  . GI bleed 05/12/2015  . Type 2 diabetes mellitus ([place]) 05/12/2015  . GERD (gastroesophageal reflux disease) 05/12/2015  . ETOH abuse 05/12/2015  . BPH (benign prostatic hyperplasia) 05/12/2015    presents to clinic with severe malnutrition and weight loss, chronic diarrhea, and resolved/transientB/L scleral icterus for follow-up management of sigmoid colorectal adenocarcinoma.  Plan:              - considering malnutrition, pancreatic insufficiency, and prior cystgastrostomy, will refer to [facility] - patient expresses agreement in returning to [facility] for surgical management where his prior surgery was performed - nutrition and abstinence from alcohol encouraged, instructed to call if any questions or concerns  All of the above recommendations were discussed with the patient, and all of patient's questions were answered to his expressed satisfaction.  -- Marilynne Drivers Rosana Hoes, MD, [place]: [facility] - Partnering for exceptional care. Office: [phone]

## 2017-02-17 ENCOUNTER — Telehealth: Payer: Self-pay | Admitting: Surgery

## 2017-02-17 NOTE — Telephone Encounter (Signed)
Spoke to [name] at [facility]. Made appointment for patient to see [name] on 02/19/17 at [place] will contact patient to inform him of appt and Fedex his new patient paperwork. All information from [place] that is needed has been received.

## 2017-02-18 ENCOUNTER — Ambulatory Visit: Payer: Medicaid Other | Admitting: Surgery

## 2017-02-27 ENCOUNTER — Encounter: Payer: Self-pay | Admitting: Oncology

## 2017-02-27 ENCOUNTER — Inpatient Hospital Stay (HOSPITAL_BASED_OUTPATIENT_CLINIC_OR_DEPARTMENT_OTHER): Payer: Medicaid Other | Admitting: Oncology

## 2017-02-27 VITALS — BP 90/59 | HR 88 | Temp 96.8°F | Wt 111.6 lb

## 2017-02-27 DIAGNOSIS — Z79899 Other long term (current) drug therapy: Secondary | ICD-10-CM

## 2017-02-27 DIAGNOSIS — D125 Benign neoplasm of sigmoid colon: Secondary | ICD-10-CM

## 2017-02-27 DIAGNOSIS — Z9119 Patient's noncompliance with other medical treatment and regimen: Secondary | ICD-10-CM

## 2017-02-27 DIAGNOSIS — K8689 Other specified diseases of pancreas: Secondary | ICD-10-CM

## 2017-02-27 DIAGNOSIS — F1010 Alcohol abuse, uncomplicated: Secondary | ICD-10-CM

## 2017-02-27 DIAGNOSIS — Z794 Long term (current) use of insulin: Secondary | ICD-10-CM | POA: Diagnosis not present

## 2017-02-27 DIAGNOSIS — F17210 Nicotine dependence, cigarettes, uncomplicated: Secondary | ICD-10-CM | POA: Diagnosis not present

## 2017-02-27 DIAGNOSIS — G8929 Other chronic pain: Secondary | ICD-10-CM

## 2017-02-27 DIAGNOSIS — E1165 Type 2 diabetes mellitus with hyperglycemia: Secondary | ICD-10-CM

## 2017-02-27 DIAGNOSIS — E440 Moderate protein-calorie malnutrition: Secondary | ICD-10-CM

## 2017-02-27 DIAGNOSIS — R109 Unspecified abdominal pain: Secondary | ICD-10-CM

## 2017-02-27 DIAGNOSIS — C187 Malignant neoplasm of sigmoid colon: Secondary | ICD-10-CM | POA: Diagnosis not present

## 2017-02-27 DIAGNOSIS — K529 Noninfective gastroenteritis and colitis, unspecified: Secondary | ICD-10-CM

## 2017-02-27 DIAGNOSIS — Z91199 Patient's noncompliance with other medical treatment and regimen due to unspecified reason: Secondary | ICD-10-CM

## 2017-02-27 DIAGNOSIS — F17200 Nicotine dependence, unspecified, uncomplicated: Secondary | ICD-10-CM

## 2017-02-27 MED ORDER — OXYCODONE HCL 10 MG PO TABS: 10.0000 mg | ORAL_TABLET | Freq: Four times a day (QID) | ORAL | 0 refills | Status: DC | PRN

## 2017-02-27 NOTE — Progress Notes (Signed)
Patient here today for follow up.   

## 2017-02-27 NOTE — Progress Notes (Signed)
[facility] Initial Visit:  Patient Care Team: [name], MD as PCP - General (Internal Medicine) Clent Jacks, RN as Registered Nurse  CHIEF COMPLAINTS/PURPOSE OF CONSULTATION: Abdominal pain. Recently diagnosed colon cancer  HISTORY OF PRESENTING ILLNESS: [name] 56 y.o. male with PMH listed below is referred by Dr.Davis to me for evaluation of recently diagnosed colorectal cancer. Patient reports that he has been having significant weight loss, abdominal pain, persistent diarrhea for past few months. And more recently he lost 15 pounds in the past 6 weeks.  He had GI work up including EGD and colonoscopy in June 2018 which revealed non obstructing large mass in sigmoid colon, several colon polyps, one rectal polyp and a large sessile mass in cardia.  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 Rectal polyp showed small fragments of adenocarcinoma.  11/30/2016 CT abdomen and pelvis with contrast showed no evidence of metastatic disease.  11/30/2016 CT head showed no acute intracranial abnormality Due to non compliancy, he has not done any advised follow up or work up till now. He was seen by Dr.Davis on 01/15/2017.  Today he reports abdominal pain which he rates 9 out of 10. He requests to get pain medication. He used to follow up with a pain clinic and due to noncompliance with appointment, he was discharged from there and he reports not having any pain medication and has tried tylenol with no relief of his pain.  Patient otherwise denies any fever/chills, CP, or any worsening of his chronic SOB. He denies drinking alcohol, however per previous note, his family members reported that he continues to drink 5-6 beers a day and more excessively on weekends. Today his girlfriend drop him to  cancer center and left. Patient has not get the [place] lower extremity doppler done which was ordered by Dr.Davis yesterday due to the concern of lower extremity tenderness and swelling.  Patient has significant family history including father died at age of 73 with colon cancer and his brother died at age of age of 51 due to colon cancer. Per patient, they did not get genetic testing.  Patient is single, lives with his mother who is 62 years old. He has total 4 children, youngest kid is 35 years old who does not live with him.   Interval history Today patient is present to the clinic for follow-up visit. Patient continued to have diarrhea, and upper and lower abdominal pain. He tried the generic Creon pills three times after each meals and feels that it helps with his bloating and diarrhea .He takes oxycodone 10 mg every 6 hours as needed and he reports pain is better controlled. Today his weight is stable.  he has now completed his workup with flexible sigmoidoscopy demonstrating no additional rectal mass for biopsy (initial rectal polyp biopsy showed microscopic adenocarcinoma concerning for contamination from sigmoid adenocarcinoma biopsy). He is referred to [facility] surgery and has been schedule for surgery on 03/10/2017.   Review of Systems  Constitutional: Positive for fatigue.  HENT:  Negative.   Eyes: Negative.   Respiratory: Negative.   Cardiovascular: Positive for leg swelling.  Gastrointestinal: Positive for abdominal distention, abdominal pain and diarrhea.  Endocrine: Negative.   Genitourinary: Negative.    Musculoskeletal: Negative.   Skin: Negative.   Neurological: Negative.   Hematological: Negative.  Psychiatric/Behavioral: Negative.     MEDICAL HISTORY: Past Medical History:  Diagnosis Date  . Anemia   . BPH without obstruction/lower urinary tract symptoms   . Cancer ([place])   . Cellulitis of scrotum   . Cyst of prostate 05/13/2015  . Diabetes ([place])   . Difficulty urinating    . Dyspnea   . Epididymoorchitis   . ETOH abuse   . Fracture of right tibia and fibula   . GERD (gastroesophageal reflux disease)   . GI bleed   . Malignant neoplasm of sigmoid colon ([place])   . Pancreatitis   . Trichomoniasis   . Wears dentures    Partial upper and lower.  reports poor fit.    SURGICAL HISTORY: Past Surgical History:  Procedure Laterality Date  . COLONOSCOPY WITH PROPOFOL N/A 12/02/2016   Procedure: COLONOSCOPY WITH PROPOFOL;  Surgeon: Lucilla Lame, MD;  Location: [facility] ENDOSCOPY;  Service: Endoscopy;  Laterality: N/A;  . ESOPHAGOGASTRODUODENOSCOPY (EGD) WITH PROPOFOL N/A 12/02/2016   Procedure: ESOPHAGOGASTRODUODENOSCOPY (EGD) WITH PROPOFOL;  Surgeon: Lucilla Lame, MD;  Location: [facility] ENDOSCOPY;  Service: Endoscopy;  Laterality: N/A;  . FLEXIBLE SIGMOIDOSCOPY N/A 02/05/2017   Procedure: FLEXIBLE SIGMOIDOSCOPY;  Surgeon: Lucilla Lame, MD;  Location: [place];  Service: Gastroenterology;  Laterality: N/A;  Needs labs drawn. Needs to come in early.  No anesthesia  . FRACTURE SURGERY     TIBIA AND FIBULA  . FRACTURE SURGERY    . resection of pancreas    . SCROTAL EXPLORATION      SOCIAL HISTORY: Social History   Social History  . Marital status: Single    Spouse name: N/A  . Number of children: N/A  . Years of education: N/A   Occupational History  . Not on file.   Social History Main Topics  . Smoking status: Current Every Day Smoker    Packs/day: 0.25    Years: 41.00    Types: Cigarettes  . Smokeless tobacco: Never Used     Comment: since age 64  . Alcohol use 0.0 oz/week     Comment: occasionally - but says not drinkning currently  . Drug use: No  . Sexual activity: Not on file   Other Topics Concern  . Not on file   Social History Narrative  . No narrative on file    FAMILY HISTORY Family History  Problem Relation Age of Onset  . Cancer - Colon Father   . Colon cancer Brother     ALLERGIES:  is allergic to  aspirin.  MEDICATIONS:  Current Outpatient Prescriptions  Medication Sig Dispense Refill  . amitriptyline (ELAVIL) 25 MG tablet Take 25 mg by mouth.    . B-D UF III MINI PEN NEEDLES 31G X 5 MM MISC   1  . cyclobenzaprine (FLEXERIL) 10 MG tablet Take 10 mg by mouth 2 (two) times daily.    Marland Kitchen LANTUS SOLOSTAR 100 UNIT/ML Solostar Pen Inject 10 Units into the skin 2 (two) times daily. (Patient taking differently: Inject 10 Units into the skin daily at 10 pm. ) 15 mL 1  . lipase/protease/amylase (CREON) 36000 UNITS CPEP capsule Take 1 capsule (36,000 Units total) by mouth 3 (three) times daily before meals. 180 capsule 3  . NOVOLOG FLEXPEN 100 UNIT/ML FlexPen Inject 48 Units into the skin 3 (three) times daily with meals.   1  . Oxycodone HCl 10 MG TABS Take 1 tablet (10 mg total) by mouth every 6 (six) hours as needed. 60 tablet 0  No current facility-administered medications for this visit.     PHYSICAL EXAMINATION:  ECOG PERFORMANCE STATUS: 1 - Symptomatic but completely ambulatory   Vitals:   02/27/17 1117  BP: (!) 90/59  Pulse: 88  Temp: (!) 96.8 F (36 C)    Filed Weights   02/27/17 1117  Weight: 111 lb 9 oz (50.6 kg)     Physical Exam GENERAL: mild distress, cachectic  SKIN:  No rashes or significant lesions  HEAD: Normocephalic, No masses, lesions, tenderness or abnormalities  EYES: Icteric sclera.  ENT: External ears normal ,lips , buccal mucosa, and tongue normal and mucous membranes are moist  LYMPH: No palpable lymphadenopathy  LUNGS: Clear to auscultation, no crackles or wheezes HEART: Regular rate & rhythm, no murmurs, no gallops, S1 normal and S2 normal  ABDOMEN: Abdomen soft, general tenderness, normal bowel sounds MSK: No CVA tenderness and no tenderness on percussion of the back or rib cage. EXTREMITIES: No edema, no skin discoloration or tenderness NEURO: Alert & oriented, no focal motor/sensory deficits.   LABORATORY DATA: I have personally reviewed  the data as listed:  Hospital Outpatient Visit on 02/11/2017  Component Date Value Ref Range Status  . WBC 02/11/2017 4.8  3.8 - 10.6 K/uL Final  . RBC 02/11/2017 3.32* 4.40 - 5.90 MIL/uL Final  . Hemoglobin 02/11/2017 10.8* 13.0 - 18.0 g/dL Final  . HCT 21/75/8179 32.4* 40.0 - 52.0 % Final  . MCV 02/11/2017 97.7  80.0 - 100.0 fL Final  . MCH 02/11/2017 32.5  26.0 - 34.0 pg Final  . MCHC 02/11/2017 33.3  32.0 - 36.0 g/dL Final  . RDW 97/97/0620 14.7* 11.5 - 14.5 % Final  . Platelets 02/11/2017 122* 150 - 440 K/uL Final  . Neutrophils Relative % 02/11/2017 67  % Final  . Neutro Abs 02/11/2017 3.2  1.4 - 6.5 K/uL Final  . Lymphocytes Relative 02/11/2017 27  % Final  . Lymphs Abs 02/11/2017 1.3  1.0 - 3.6 K/uL Final  . Monocytes Relative 02/11/2017 5  % Final  . Monocytes Absolute 02/11/2017 0.2  0.2 - 1.0 K/uL Final  . Eosinophils Relative 02/11/2017 1  % Final  . Eosinophils Absolute 02/11/2017 0.1  0 - 0.7 K/uL Final  . Basophils Relative 02/11/2017 0  % Final  . Basophils Absolute 02/11/2017 0.0  0 - 0.1 K/uL Final  . Sodium 02/11/2017 130* 135 - 145 mmol/L Final  . Potassium 02/11/2017 4.2  3.5 - 5.1 mmol/L Final  . Chloride 02/11/2017 98* 101 - 111 mmol/L Final  . CO2 02/11/2017 22  22 - 32 mmol/L Final  . Glucose, Bld 02/11/2017 722* 65 - 99 mg/dL Final   Comment: CRITICAL RESULT CALLED TO, READ BACK BY AND VERIFIED WITH CHANEL PRICE AT 1204 ON 02/11/17 [facility].   . BUN 02/11/2017 15  6 - 20 mg/dL Final  . Creatinine, Ser 02/11/2017 1.15  0.61 - 1.24 mg/dL Final  . Calcium 67/94/0189 8.6* 8.9 - 10.3 mg/dL Final  . Total Protein 02/11/2017 6.4* 6.5 - 8.1 g/dL Final  . Albumin 92/05/3561 3.2* 3.5 - 5.0 g/dL Final  . AST 68/53/9592 62* 15 - 41 U/L Final  . ALT 02/11/2017 54  17 - 63 U/L Final  . Alkaline Phosphatase 02/11/2017 104  38 - 126 U/L Final  . Total Bilirubin 02/11/2017 1.0  0.3 - 1.2 mg/dL Final  . GFR calc non Af Amer 02/11/2017 >60  >60 mL/min Final  . GFR calc Af  Amer 02/11/2017 >60  >60  mL/min Final   Comment: (NOTE) The eGFR has been calculated using the CKD EPI equation. This calculation has not been validated in all clinical situations. eGFR's persistently <60 mL/min signify possible Chronic Kidney Disease.   . Anion gap 02/11/2017 10  5 - 15 Final  . Prothrombin Time 02/11/2017 14.8  11.4 - 15.2 seconds Final  . INR 02/11/2017 1.15   Final  . aPTT 02/11/2017 30  24 - 36 seconds Final  Admission on 02/05/2017, Discharged on 02/05/2017  Component Date Value Ref Range Status  . Glucose-Capillary 02/05/2017 442* 65 - 99 mg/dL Final    RADIOGRAPHIC STUDIES: I have personally reviewed the radiological images as listed and agree with the findings in the report 01/15/2017 [place] bilateral lower extremity doppler: No lower extremity DVT in either side.  11/30/2016: CT abdomen pelvis w contrast: Stable chronic changes as described above.Small gastrohepatic ligament lymph nodes slightly more prominent than that seen on the prior exam likely of a reactive nature. 11/30/2016: CT head wo contrast: Chronic stable small vessel ischemic disease of periventricular white matter. No acute intracranial abnormality. 01/23/2017 CT chest abdomen pelvis w contrast 1. The sigmoid colon mass is difficult to perceive. No definite adenopathy in this vicinity. No liver metastatic disease is identified. 2. Chronically stable right hepatic lobe hemangioma. 3. I am suspicious of hepatic steatosis. 4. Mildly enlarged right gastric lymph node at 1.1 cm, previously 1.2 cm, significance uncertain. 5. Other imaging findings of potential clinical significance: 7 mm hypodense lesion in the right kidney upper pole is technically nonspecific although statistically likely to be a cyst. Descending colon diverticulosis. Aortic Atherosclerosis (ICD10-I70.0). Chronic crescentic hypodensity in the right prostate gland, possibly a cyst or some type of urethral diverticulum. Lower lumbar degenerative  disc disease.   ASSESSMENT/PLAN   1. Moderate protein-calorie malnutrition ([place])   2. Malignant neoplasm of sigmoid colon ([place])   3. Pancreatic insufficiency   4. Chronic diarrhea   5. Tobacco dependence   6. Type 2 diabetes mellitus with hyperglycemia, with long-term current use of insulin ([facility])   7. Noncompliance   8. ETOH abuse   9. Chronic abdominal pain   .  Plan:  His compliance seems to be improving as he has showed up to every appointments including his appointment to [facility]. Surgery scheduled..  # Continue his pain medication, refilled oxycodone '10mg'$  Q6 hour PRN, dispense 60 pill.  # I titrate his Creon to 36,000 units TID to see if further helping with his bloating and diarrhea.   # He has a strong history of family history of colon cancer, needs genetic counseling. For now we will concentrate surgery. Will revisit this in the future.   # Advise patient to stop ETOH consumption. He reports that he has not been drinking lately. Smoke cessation discussed.  # Follow up with primary care provider for tighter control of DM.  All questions were answered. The patient knows to call the clinic with any problems, questions or concerns. Return visit 2 weeks after surgery for discussion of pathology  Dr. Earlie Server, MD, PhD [facility] at [facility] Pager- [phone] 02/27/2017

## 2017-03-17 ENCOUNTER — Telehealth: Payer: Self-pay

## 2017-03-17 NOTE — Telephone Encounter (Signed)
Spoke with [name] from the Cancer center at this time. She wanted to let us knowthat he did not keep his appointment at [facility].   She said the patient called and wanted her to reschedule his appointment.   I spoke with Mr.Ardizzone and provided number to [facility] and let him know he could call and arrange an appointment due to his schedule.  Patient was scheduled  for surgery 03/10/17 @ [facility] however he would not go to his pre-op.

## 2017-03-19 ENCOUNTER — Other Ambulatory Visit: Payer: Self-pay | Admitting: *Deleted

## 2017-03-19 DIAGNOSIS — F1010 Alcohol abuse, uncomplicated: Secondary | ICD-10-CM

## 2017-03-19 DIAGNOSIS — K8689 Other specified diseases of pancreas: Secondary | ICD-10-CM

## 2017-03-19 DIAGNOSIS — C187 Malignant neoplasm of sigmoid colon: Secondary | ICD-10-CM

## 2017-03-19 DIAGNOSIS — Z91199 Patient's noncompliance with other medical treatment and regimen due to unspecified reason: Secondary | ICD-10-CM

## 2017-03-19 DIAGNOSIS — E1165 Type 2 diabetes mellitus with hyperglycemia: Secondary | ICD-10-CM

## 2017-03-19 DIAGNOSIS — R109 Unspecified abdominal pain: Secondary | ICD-10-CM

## 2017-03-19 DIAGNOSIS — Z9119 Patient's noncompliance with other medical treatment and regimen: Secondary | ICD-10-CM

## 2017-03-19 DIAGNOSIS — Z794 Long term (current) use of insulin: Secondary | ICD-10-CM

## 2017-03-19 DIAGNOSIS — K529 Noninfective gastroenteritis and colitis, unspecified: Secondary | ICD-10-CM

## 2017-03-19 DIAGNOSIS — G8929 Other chronic pain: Secondary | ICD-10-CM

## 2017-03-19 DIAGNOSIS — F17200 Nicotine dependence, unspecified, uncomplicated: Secondary | ICD-10-CM

## 2017-03-19 DIAGNOSIS — E440 Moderate protein-calorie malnutrition: Secondary | ICD-10-CM

## 2017-03-19 MED ORDER — OXYCODONE HCL 10 MG PO TABS: 10.0000 mg | ORAL_TABLET | Freq: Four times a day (QID) | ORAL | 0 refills | Status: DC | PRN

## 2017-03-19 NOTE — Telephone Encounter (Signed)
Patient informed that his prescription is ready for pickup. He confirmed that he will keep his appointment Tuesday as planned. He states his surgery is supposed to be being rescheduled and they wanted him to do it today, but he did not have the prep so he did not have it done

## 2017-03-24 ENCOUNTER — Inpatient Hospital Stay: Payer: Medicaid Other

## 2017-03-24 ENCOUNTER — Inpatient Hospital Stay: Payer: Medicaid Other | Attending: Oncology | Admitting: Oncology

## 2017-03-24 ENCOUNTER — Encounter: Payer: Self-pay | Admitting: Oncology

## 2017-03-24 VITALS — BP 88/69 | HR 95 | Temp 97.0°F | Wt 104.2 lb

## 2017-03-24 DIAGNOSIS — E1165 Type 2 diabetes mellitus with hyperglycemia: Secondary | ICD-10-CM

## 2017-03-24 DIAGNOSIS — Z9119 Patient's noncompliance with other medical treatment and regimen: Secondary | ICD-10-CM | POA: Insufficient documentation

## 2017-03-24 DIAGNOSIS — C187 Malignant neoplasm of sigmoid colon: Secondary | ICD-10-CM

## 2017-03-24 DIAGNOSIS — Z91199 Patient's noncompliance with other medical treatment and regimen due to unspecified reason: Secondary | ICD-10-CM

## 2017-03-24 DIAGNOSIS — K8689 Other specified diseases of pancreas: Secondary | ICD-10-CM

## 2017-03-24 DIAGNOSIS — Z794 Long term (current) use of insulin: Secondary | ICD-10-CM

## 2017-03-24 DIAGNOSIS — R109 Unspecified abdominal pain: Secondary | ICD-10-CM

## 2017-03-24 DIAGNOSIS — K529 Noninfective gastroenteritis and colitis, unspecified: Secondary | ICD-10-CM | POA: Diagnosis not present

## 2017-03-24 DIAGNOSIS — N400 Enlarged prostate without lower urinary tract symptoms: Secondary | ICD-10-CM | POA: Insufficient documentation

## 2017-03-24 DIAGNOSIS — K859 Acute pancreatitis without necrosis or infection, unspecified: Secondary | ICD-10-CM | POA: Insufficient documentation

## 2017-03-24 DIAGNOSIS — R634 Abnormal weight loss: Secondary | ICD-10-CM

## 2017-03-24 DIAGNOSIS — F17210 Nicotine dependence, cigarettes, uncomplicated: Secondary | ICD-10-CM | POA: Diagnosis not present

## 2017-03-24 DIAGNOSIS — E440 Moderate protein-calorie malnutrition: Secondary | ICD-10-CM | POA: Diagnosis not present

## 2017-03-24 DIAGNOSIS — Z79899 Other long term (current) drug therapy: Secondary | ICD-10-CM | POA: Insufficient documentation

## 2017-03-24 DIAGNOSIS — F1010 Alcohol abuse, uncomplicated: Secondary | ICD-10-CM

## 2017-03-24 DIAGNOSIS — K219 Gastro-esophageal reflux disease without esophagitis: Secondary | ICD-10-CM | POA: Insufficient documentation

## 2017-03-24 DIAGNOSIS — G8929 Other chronic pain: Secondary | ICD-10-CM

## 2017-03-24 DIAGNOSIS — D125 Benign neoplasm of sigmoid colon: Secondary | ICD-10-CM | POA: Diagnosis not present

## 2017-03-24 DIAGNOSIS — F17200 Nicotine dependence, unspecified, uncomplicated: Secondary | ICD-10-CM

## 2017-03-24 LAB — CBC WITH DIFFERENTIAL/PLATELET
BASOS PCT: 0 %
Basophils Absolute: 0 10*3/uL (ref 0–0.1)
Eosinophils Absolute: 0 10*3/uL (ref 0–0.7)
Eosinophils Relative: 0 %
HEMATOCRIT: 34.5 % — AB (ref 40.0–52.0)
HEMOGLOBIN: 11.9 g/dL — AB (ref 13.0–18.0)
LYMPHS ABS: 1.2 10*3/uL (ref 1.0–3.6)
Lymphocytes Relative: 21 %
MCH: 32.7 pg (ref 26.0–34.0)
MCHC: 34.4 g/dL (ref 32.0–36.0)
MCV: 95.1 fL (ref 80.0–100.0)
MONOS PCT: 6 %
Monocytes Absolute: 0.3 10*3/uL (ref 0.2–1.0)
NEUTROS PCT: 73 %
Neutro Abs: 4.3 10*3/uL (ref 1.4–6.5)
Platelets: 142 10*3/uL — ABNORMAL LOW (ref 150–440)
RBC: 3.63 MIL/uL — AB (ref 4.40–5.90)
RDW: 14.8 % — ABNORMAL HIGH (ref 11.5–14.5)
WBC: 5.9 10*3/uL (ref 3.8–10.6)

## 2017-03-24 LAB — COMPREHENSIVE METABOLIC PANEL
ALBUMIN: 3.4 g/dL — AB (ref 3.5–5.0)
ALK PHOS: 163 U/L — AB (ref 38–126)
ALT: 66 U/L — ABNORMAL HIGH (ref 17–63)
ANION GAP: 12 (ref 5–15)
AST: 96 U/L — ABNORMAL HIGH (ref 15–41)
BILIRUBIN TOTAL: 2 mg/dL — AB (ref 0.3–1.2)
BUN: 10 mg/dL (ref 6–20)
CALCIUM: 9.4 mg/dL (ref 8.9–10.3)
CO2: 24 mmol/L (ref 22–32)
Chloride: 96 mmol/L — ABNORMAL LOW (ref 101–111)
Creatinine, Ser: 1.13 mg/dL (ref 0.61–1.24)
GLUCOSE: 490 mg/dL — AB (ref 65–99)
Potassium: 3.4 mmol/L — ABNORMAL LOW (ref 3.5–5.1)
Sodium: 132 mmol/L — ABNORMAL LOW (ref 135–145)
TOTAL PROTEIN: 6.8 g/dL (ref 6.5–8.1)

## 2017-03-24 NOTE — Progress Notes (Signed)
[facility] Initial Visit:  Patient Care Team: [name], MD as PCP - General (Internal Medicine) Clent Jacks, RN as Registered Nurse  CHIEF COMPLAINTS/PURPOSE OF CONSULTATION: Follow up on treatment of colon cancer  HISTORY OF PRESENTING ILLNESS: [name] 56 y.o. male with PMH listed below is referred by Dr.Davis to me for evaluation of recently diagnosed colorectal cancer. Patient reports that he has been having significant weight loss, abdominal pain, persistent diarrhea for past few months. And more recently he lost 15 pounds in the past 6 weeks.  He had GI work up including EGD and colonoscopy in June 2018 which revealed non obstructing large mass in sigmoid colon, several colon polyps, one rectal polyp and a large sessile mass in cardia.  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 Rectal polyp showed small fragments of adenocarcinoma.  11/30/2016 CT abdomen and pelvis with contrast showed no evidence of metastatic disease.  11/30/2016 CT head showed no acute intracranial abnormality Due to non compliancy, he has not done any advised follow up or work up till now. He was seen by Dr.Davis on 01/15/2017.  Today he reports abdominal pain which he rates 9 out of 10. He requests to get pain medication. He used to follow up with a pain clinic and due to noncompliance with appointment, he was discharged from there and he reports not having any pain medication and has tried tylenol with no relief of his pain.  Patient otherwise denies any fever/chills, CP, or any worsening of his chronic SOB. He denies drinking alcohol, however per previous note, his family members reported that he continues to drink 5-6 beers a day and more excessively on weekends. Today his girlfriend drop him to cancer  center and left. Patient has not get the [place] lower extremity doppler done which was ordered by Dr.Davis yesterday due to the concern of lower extremity tenderness and swelling.  Patient has significant family history including father died at age of 60 with colon cancer and his brother died at age of age of 67 due to colon cancer. Per patient, they did not get genetic testing.  Patient is single, lives with his mother who is 74 years old. He has total 4 children, youngest kid is 75 years old who does not live with him.   # he has now completed his workup with flexible sigmoidoscopy demonstrating no additional rectal mass for biopsy (initial rectal polyp biopsy showed microscopic adenocarcinoma concerning for contamination from sigmoid adenocarcinoma biopsy). He is referred to [facility] surgery and has been schedule for surgery on 03/10/2017.    Interval history Today patient is present to the clinic for follow-up visit. Patient continued to have diarrhea, and upper and lower abdominal pain. He takes generic Creon pills three times after each meals and feels that it helps with his bloating and diarrhea .He takes oxycodone 10 mg every 6 hours as needed and I gave him a refill of 60 tablets recently.  He dropped 7 pounds during this interval. He had an appointment with [facility] surgery on 03/10/2017 however, per [facility] documentation patient was called on 03/06/2010 to give consent and go to [facility] before surgery on 9/11. Patient was rude and hung up.   Patient told me that his son killed someone and currently is in custody. He was very overwhelmed about  the situation and that's why he did not follow up with [facility] surgery. His blood pressure was low in the office with systolic BP in 16X. His blood pressure runs low chronically and he denies any dizziness, lightheaded,  Review of Systems  Constitutional: Positive for fatigue.  HENT:  Negative.   Eyes: Negative.   Respiratory: Negative.   Cardiovascular: Negative.    Gastrointestinal: Positive for abdominal distention, abdominal pain and diarrhea.  Endocrine: Negative.   Genitourinary: Negative.    Musculoskeletal: Negative.   Skin: Negative.   Neurological: Negative.   Hematological: Negative.   Psychiatric/Behavioral: Negative.     MEDICAL HISTORY: Past Medical History:  Diagnosis Date  . Anemia   . BPH without obstruction/lower urinary tract symptoms   . Cancer ([place])   . Cellulitis of scrotum   . Cyst of prostate 05/13/2015  . Diabetes ([place])   . Difficulty urinating   . Dyspnea   . Epididymoorchitis   . ETOH abuse   . Fracture of right tibia and fibula   . GERD (gastroesophageal reflux disease)   . GI bleed   . Malignant neoplasm of sigmoid colon ([place])   . Pancreatitis   . Trichomoniasis   . Wears dentures    Partial upper and lower.  reports poor fit.    SURGICAL HISTORY: Past Surgical History:  Procedure Laterality Date  . COLONOSCOPY WITH PROPOFOL N/A 12/02/2016   Procedure: COLONOSCOPY WITH PROPOFOL;  Surgeon: Lucilla Lame, MD;  Location: [facility] ENDOSCOPY;  Service: Endoscopy;  Laterality: N/A;  . ESOPHAGOGASTRODUODENOSCOPY (EGD) WITH PROPOFOL N/A 12/02/2016   Procedure: ESOPHAGOGASTRODUODENOSCOPY (EGD) WITH PROPOFOL;  Surgeon: Lucilla Lame, MD;  Location: [facility] ENDOSCOPY;  Service: Endoscopy;  Laterality: N/A;  . FLEXIBLE SIGMOIDOSCOPY N/A 02/05/2017   Procedure: FLEXIBLE SIGMOIDOSCOPY;  Surgeon: Lucilla Lame, MD;  Location: [place];  Service: Gastroenterology;  Laterality: N/A;  Needs labs drawn. Needs to come in early.  No anesthesia  . FRACTURE SURGERY     TIBIA AND FIBULA  . FRACTURE SURGERY    . resection of pancreas    . SCROTAL EXPLORATION      SOCIAL HISTORY: Social History   Social History  . Marital status: Single    Spouse name: N/A  . Number of children: N/A  . Years of education: N/A   Occupational History  . Not on file.   Social History Main Topics  . Smoking status: Current Every Day  Smoker    Packs/day: 0.25    Years: 41.00    Types: Cigarettes  . Smokeless tobacco: Never Used     Comment: since age 20  . Alcohol use 0.0 oz/week     Comment: occasionally - but says not drinkning currently  . Drug use: No  . Sexual activity: Not on file   Other Topics Concern  . Not on file   Social History Narrative  . No narrative on file    FAMILY HISTORY Family History  Problem Relation Age of Onset  . Cancer - Colon Father   . Colon cancer Brother     ALLERGIES:  is allergic to aspirin.  MEDICATIONS:  Current Outpatient Prescriptions  Medication Sig Dispense Refill  . amitriptyline (ELAVIL) 25 MG tablet Take 25 mg by mouth.    . B-D UF III MINI PEN NEEDLES 31G X 5 MM MISC   1  . cyclobenzaprine (FLEXERIL) 10 MG tablet Take 10 mg by mouth 2 (two) times daily.    . diphenoxylate-atropine (LOMOTIL) 2.5-0.025 MG tablet take  1 tablet four times a day if needed    . fluticasone (FLONASE) 50 MCG/ACT nasal spray 2 sprays by Each Nare route daily.    Marland Kitchen LANTUS SOLOSTAR 100 UNIT/ML Solostar Pen Inject 10 Units into the skin 2 (two) times daily. (Patient taking differently: Inject 10 Units into the skin daily at 10 pm. ) 15 mL 1  . lipase/protease/amylase (CREON) 36000 UNITS CPEP capsule Take 1 capsule (36,000 Units total) by mouth 3 (three) times daily before meals. 180 capsule 3  . NOVOLOG FLEXPEN 100 UNIT/ML FlexPen Inject 48 Units into the skin 3 (three) times daily with meals.   1  . Oxycodone HCl 10 MG TABS Take 1 tablet (10 mg total) by mouth every 6 (six) hours as needed. 60 tablet 0  . traZODone (DESYREL) 50 MG tablet take 1 tablet by mouth at bedtime TO HELP WITH SLEEP    . Vitamins/Minerals TABS Take by mouth.     No current facility-administered medications for this visit.     PHYSICAL EXAMINATION:  ECOG PERFORMANCE STATUS: 1 - Symptomatic but completely ambulatory   Vitals:   03/24/17 1143 03/24/17 1200  BP: (!) 72/42 (!) 88/69  Pulse: 95   Temp: (!) 97  F (36.1 C)     Filed Weights   03/24/17 1143  Weight: 104 lb 3.2 oz (47.3 kg)     Physical Exam GENERAL: mild distress, cachectic  SKIN:  No rashes or significant lesions  HEAD: Normocephalic, No masses, lesions, tenderness or abnormalities  EYES: Icteric sclera.  ENT: External ears normal ,lips , buccal mucosa, and tongue normal and mucous membranes are moist  LYMPH: No palpable lymphadenopathy  LUNGS: Clear to auscultation, no crackles or wheezes HEART: Regular rate & rhythm, no murmurs, no gallops, S1 normal and S2 normal  ABDOMEN: Abdomen soft, general tenderness, normal bowel sounds MSK: No CVA tenderness and no tenderness on percussion of the back or rib cage. EXTREMITIES: No edema, no skin discoloration or tenderness NEURO: Alert & oriented, no focal motor/sensory deficits.   LABORATORY DATA: I have personally reviewed the data as listed:  Appointment on 03/24/2017  Component Date Value Ref Range Status  . WBC 03/24/2017 5.9  3.8 - 10.6 K/uL Final  . RBC 03/24/2017 3.63* 4.40 - 5.90 MIL/uL Final  . Hemoglobin 03/24/2017 11.9* 13.0 - 18.0 g/dL Final  . HCT 03/24/2017 34.5* 40.0 - 52.0 % Final  . MCV 03/24/2017 95.1  80.0 - 100.0 fL Final  . MCH 03/24/2017 32.7  26.0 - 34.0 pg Final  . MCHC 03/24/2017 34.4  32.0 - 36.0 g/dL Final  . RDW 03/24/2017 14.8* 11.5 - 14.5 % Final  . Platelets 03/24/2017 142* 150 - 440 K/uL Final  . Neutrophils Relative % 03/24/2017 73  % Final  . Neutro Abs 03/24/2017 4.3  1.4 - 6.5 K/uL Final  . Lymphocytes Relative 03/24/2017 21  % Final  . Lymphs Abs 03/24/2017 1.2  1.0 - 3.6 K/uL Final  . Monocytes Relative 03/24/2017 6  % Final  . Monocytes Absolute 03/24/2017 0.3  0.2 - 1.0 K/uL Final  . Eosinophils Relative 03/24/2017 0  % Final  . Eosinophils Absolute 03/24/2017 0.0  0 - 0.7 K/uL Final  . Basophils Relative 03/24/2017 0  % Final  . Basophils Absolute 03/24/2017 0.0  0 - 0.1 K/uL Final  . Sodium 03/24/2017 132* 135 - 145 mmol/L  Final  . Potassium 03/24/2017 3.4* 3.5 - 5.1 mmol/L Final  . Chloride 03/24/2017 96* 101 - 111  mmol/L Final  . CO2 03/24/2017 24  22 - 32 mmol/L Final  . Glucose, Bld 03/24/2017 490* 65 - 99 mg/dL Final  . BUN 03/24/2017 10  6 - 20 mg/dL Final  . Creatinine, Ser 03/24/2017 1.13  0.61 - 1.24 mg/dL Final  . Calcium 03/24/2017 9.4  8.9 - 10.3 mg/dL Final  . Total Protein 03/24/2017 6.8  6.5 - 8.1 g/dL Final  . Albumin 03/24/2017 3.4* 3.5 - 5.0 g/dL Final  . AST 03/24/2017 96* 15 - 41 U/L Final  . ALT 03/24/2017 66* 17 - 63 U/L Final  . Alkaline Phosphatase 03/24/2017 163* 38 - 126 U/L Final  . Total Bilirubin 03/24/2017 2.0* 0.3 - 1.2 mg/dL Final  . GFR calc non Af Amer 03/24/2017 >60  >60 mL/min Final  . GFR calc Af Amer 03/24/2017 >60  >60 mL/min Final   Comment: (NOTE) The eGFR has been calculated using the CKD EPI equation. This calculation has not been validated in all clinical situations. eGFR's persistently <60 mL/min signify possible Chronic Kidney Disease.   . Anion gap 03/24/2017 12  5 - 15 Final    RADIOGRAPHIC STUDIES: I have personally reviewed the radiological images as listed and agree with the findings in the report 01/15/2017 [place] bilateral lower extremity doppler: No lower extremity DVT in either side.  11/30/2016: CT abdomen pelvis w contrast: Stable chronic changes as described above.Small gastrohepatic ligament lymph nodes slightly more prominent than that seen on the prior exam likely of a reactive nature. 11/30/2016: CT head wo contrast: Chronic stable small vessel ischemic disease of periventricular white matter. No acute intracranial abnormality. 01/23/2017 CT chest abdomen pelvis w contrast 1. The sigmoid colon mass is difficult to perceive. No definite adenopathy in this vicinity. No liver metastatic disease is identified. 2. Chronically stable right hepatic lobe hemangioma. 3. I am suspicious of hepatic steatosis. 4. Mildly enlarged right gastric lymph node at 1.1  cm, previously 1.2 cm, significance uncertain. 5. Other imaging findings of potential clinical significance: 7 mm hypodense lesion in the right kidney upper pole is technically nonspecific although statistically likely to be a cyst. Descending colon diverticulosis. Aortic Atherosclerosis (ICD10-I70.0). Chronic crescentic hypodensity in the right prostate gland, possibly a cyst or some type of urethral diverticulum. Lower lumbar degenerative disc disease.   ASSESSMENT/PLAN   1. Moderate protein-calorie malnutrition ([place])   2. Malignant neoplasm of sigmoid colon ([place])   3. Pancreatic insufficiency   4. Chronic diarrhea   5. Type 2 diabetes mellitus with hyperglycemia, with long-term current use of insulin ([facility])   6. ETOH abuse   7. Chronic abdominal pain   8. Weight loss, abnormal   .  Plan:  His compliance seems to be improving as he has showed up to every appointments including his appointment to [facility], however due to his son's recent problem, he did not follow up with surgery. I discussed with him and he is motivated and wants to be re-connected to surgery.    # Continue his pain medication, 03/19/2017 refilled oxycodone 53m Q6 hour PRN, dispense 60 pill.  # Ongoing diarrhea: from pancreatic dysfunction as well as colon cancer. Continue Creon to 36,000 units TID, lomotil PRN. # Hypotension: patient denies any symptoms. Acute on chronically low BP, likely due to long standing DM induced autonomic dysfunction, acutely lower than his baseline , possibly due to ongoing diarrhea. I discussed with him about IV hydration with 1 NS at infusion center today and he declined. He did not want to go  to ER for hydration. He did not eat or drink anything this morning. We ask him to drink a cup of water and after a while, BP was measured and was improved to 89/69  # He has a strong history of family history of colon cancer, needs genetic counseling. For now we will concentrate surgery. Will revisit this in  the future.  # Advise patient to stop ETOH consumption. He reports that he has not been drinking lately. Smoke cessation discussed.  # Follow up with primary care provider for tighter control of DM.  All questions were answered. The patient knows to call the clinic with any problems, questions or concerns. Return visit in 4 weeks (subjective to change according to his [facility] surgery date)  Dr. Earlie Server, MD, PhD [facility] at [facility] Pager- [phone] 03/24/2017

## 2017-03-25 LAB — CEA: CEA: 27.3 ng/mL — ABNORMAL HIGH (ref 0.0–4.7)

## 2017-04-02 ENCOUNTER — Other Ambulatory Visit: Payer: Self-pay | Admitting: *Deleted

## 2017-04-02 DIAGNOSIS — C187 Malignant neoplasm of sigmoid colon: Secondary | ICD-10-CM

## 2017-04-02 DIAGNOSIS — Z91199 Patient's noncompliance with other medical treatment and regimen due to unspecified reason: Secondary | ICD-10-CM

## 2017-04-02 DIAGNOSIS — F1010 Alcohol abuse, uncomplicated: Secondary | ICD-10-CM

## 2017-04-02 DIAGNOSIS — K529 Noninfective gastroenteritis and colitis, unspecified: Secondary | ICD-10-CM

## 2017-04-02 DIAGNOSIS — E1165 Type 2 diabetes mellitus with hyperglycemia: Secondary | ICD-10-CM

## 2017-04-02 DIAGNOSIS — R109 Unspecified abdominal pain: Secondary | ICD-10-CM

## 2017-04-02 DIAGNOSIS — E440 Moderate protein-calorie malnutrition: Secondary | ICD-10-CM

## 2017-04-02 DIAGNOSIS — F17200 Nicotine dependence, unspecified, uncomplicated: Secondary | ICD-10-CM

## 2017-04-02 DIAGNOSIS — K8689 Other specified diseases of pancreas: Secondary | ICD-10-CM

## 2017-04-02 DIAGNOSIS — Z794 Long term (current) use of insulin: Secondary | ICD-10-CM

## 2017-04-02 DIAGNOSIS — Z9119 Patient's noncompliance with other medical treatment and regimen: Secondary | ICD-10-CM

## 2017-04-02 DIAGNOSIS — G8929 Other chronic pain: Secondary | ICD-10-CM

## 2017-04-02 MED ORDER — OXYCODONE HCL 10 MG PO TABS: 10.0000 mg | ORAL_TABLET | Freq: Four times a day (QID) | ORAL | 0 refills | Status: DC | PRN

## 2017-04-07 ENCOUNTER — Telehealth: Payer: Self-pay | Admitting: *Deleted

## 2017-04-07 NOTE — Telephone Encounter (Signed)
Left vm on 9/25 while patient was here in the office at [facility] to call us back to schedule pt new surgery date  04/03/17 spoke with Lenna Sciara at [place] of surg onc [place]  at [phone] notified them that pt needs new surgery date, was told that the scheduler will call pt to reschd

## 2017-04-07 NOTE — Telephone Encounter (Signed)
Called pt to confirm that [facility] has called him to rescheduled, pt stated that they have not contacted him.  Spoke with Lenna Sciara and stacey at [facility] surgery, Erline Levine stated that they have tried to contact pt several times with no success.  I called pt back gave him the number directly to [place] [phone])  and told him to call now that she was waiting for his call, pt verbalized understanding.

## 2017-04-10 ENCOUNTER — Encounter: Payer: Self-pay | Admitting: Surgery

## 2017-04-16 DIAGNOSIS — C20 Malignant neoplasm of rectum: Secondary | ICD-10-CM | POA: Insufficient documentation

## 2017-04-17 HISTORY — PX: LAPAROSCOPIC PARTIAL COLECTOMY: SHX5907

## 2017-04-21 DIAGNOSIS — F17200 Nicotine dependence, unspecified, uncomplicated: Secondary | ICD-10-CM | POA: Insufficient documentation

## 2017-04-22 ENCOUNTER — Other Ambulatory Visit: Payer: Self-pay | Admitting: *Deleted

## 2017-04-22 DIAGNOSIS — K8689 Other specified diseases of pancreas: Secondary | ICD-10-CM

## 2017-04-22 DIAGNOSIS — C187 Malignant neoplasm of sigmoid colon: Secondary | ICD-10-CM

## 2017-04-22 DIAGNOSIS — F17200 Nicotine dependence, unspecified, uncomplicated: Secondary | ICD-10-CM

## 2017-04-22 DIAGNOSIS — E440 Moderate protein-calorie malnutrition: Secondary | ICD-10-CM

## 2017-04-22 DIAGNOSIS — Z794 Long term (current) use of insulin: Secondary | ICD-10-CM

## 2017-04-22 DIAGNOSIS — K529 Noninfective gastroenteritis and colitis, unspecified: Secondary | ICD-10-CM

## 2017-04-22 DIAGNOSIS — G8929 Other chronic pain: Secondary | ICD-10-CM

## 2017-04-22 DIAGNOSIS — R109 Unspecified abdominal pain: Secondary | ICD-10-CM

## 2017-04-22 DIAGNOSIS — F1010 Alcohol abuse, uncomplicated: Secondary | ICD-10-CM

## 2017-04-22 DIAGNOSIS — Z91199 Patient's noncompliance with other medical treatment and regimen due to unspecified reason: Secondary | ICD-10-CM

## 2017-04-22 DIAGNOSIS — Z9119 Patient's noncompliance with other medical treatment and regimen: Secondary | ICD-10-CM

## 2017-04-22 DIAGNOSIS — E1165 Type 2 diabetes mellitus with hyperglycemia: Secondary | ICD-10-CM

## 2017-04-22 MED ORDER — OXYCODONE HCL 10 MG PO TABS: 10.0000 mg | ORAL_TABLET | Freq: Four times a day (QID) | ORAL | 0 refills | Status: DC | PRN

## 2017-05-05 ENCOUNTER — Inpatient Hospital Stay: Payer: Medicaid Other | Attending: Oncology | Admitting: Oncology

## 2017-05-05 ENCOUNTER — Inpatient Hospital Stay: Payer: Medicaid Other

## 2017-05-05 ENCOUNTER — Telehealth: Payer: Self-pay | Admitting: *Deleted

## 2017-05-05 ENCOUNTER — Encounter: Payer: Self-pay | Admitting: Oncology

## 2017-05-05 ENCOUNTER — Telehealth: Payer: Self-pay | Admitting: Oncology

## 2017-05-05 VITALS — BP 114/81 | HR 89 | Temp 96.8°F | Resp 18 | Wt 114.0 lb

## 2017-05-05 DIAGNOSIS — E1165 Type 2 diabetes mellitus with hyperglycemia: Secondary | ICD-10-CM | POA: Diagnosis not present

## 2017-05-05 DIAGNOSIS — G8929 Other chronic pain: Secondary | ICD-10-CM

## 2017-05-05 DIAGNOSIS — C187 Malignant neoplasm of sigmoid colon: Secondary | ICD-10-CM | POA: Insufficient documentation

## 2017-05-05 DIAGNOSIS — D122 Benign neoplasm of ascending colon: Secondary | ICD-10-CM

## 2017-05-05 DIAGNOSIS — R5383 Other fatigue: Secondary | ICD-10-CM | POA: Diagnosis not present

## 2017-05-05 DIAGNOSIS — K219 Gastro-esophageal reflux disease without esophagitis: Secondary | ICD-10-CM | POA: Diagnosis not present

## 2017-05-05 DIAGNOSIS — E440 Moderate protein-calorie malnutrition: Secondary | ICD-10-CM | POA: Diagnosis not present

## 2017-05-05 DIAGNOSIS — Z9114 Patient's other noncompliance with medication regimen: Secondary | ICD-10-CM | POA: Diagnosis not present

## 2017-05-05 DIAGNOSIS — Z886 Allergy status to analgesic agent status: Secondary | ICD-10-CM | POA: Diagnosis not present

## 2017-05-05 DIAGNOSIS — F17200 Nicotine dependence, unspecified, uncomplicated: Secondary | ICD-10-CM

## 2017-05-05 DIAGNOSIS — F1010 Alcohol abuse, uncomplicated: Secondary | ICD-10-CM | POA: Insufficient documentation

## 2017-05-05 DIAGNOSIS — Z91199 Patient's noncompliance with other medical treatment and regimen due to unspecified reason: Secondary | ICD-10-CM

## 2017-05-05 DIAGNOSIS — Z79899 Other long term (current) drug therapy: Secondary | ICD-10-CM | POA: Diagnosis not present

## 2017-05-05 DIAGNOSIS — R109 Unspecified abdominal pain: Secondary | ICD-10-CM | POA: Diagnosis not present

## 2017-05-05 DIAGNOSIS — F17210 Nicotine dependence, cigarettes, uncomplicated: Secondary | ICD-10-CM | POA: Insufficient documentation

## 2017-05-05 DIAGNOSIS — Z794 Long term (current) use of insulin: Secondary | ICD-10-CM | POA: Insufficient documentation

## 2017-05-05 DIAGNOSIS — R634 Abnormal weight loss: Secondary | ICD-10-CM | POA: Insufficient documentation

## 2017-05-05 DIAGNOSIS — Z9119 Patient's noncompliance with other medical treatment and regimen: Secondary | ICD-10-CM | POA: Insufficient documentation

## 2017-05-05 DIAGNOSIS — K8689 Other specified diseases of pancreas: Secondary | ICD-10-CM

## 2017-05-05 DIAGNOSIS — K529 Noninfective gastroenteritis and colitis, unspecified: Secondary | ICD-10-CM

## 2017-05-05 DIAGNOSIS — N400 Enlarged prostate without lower urinary tract symptoms: Secondary | ICD-10-CM | POA: Diagnosis not present

## 2017-05-05 DIAGNOSIS — D125 Benign neoplasm of sigmoid colon: Secondary | ICD-10-CM | POA: Diagnosis not present

## 2017-05-05 DIAGNOSIS — K861 Other chronic pancreatitis: Secondary | ICD-10-CM | POA: Insufficient documentation

## 2017-05-05 DIAGNOSIS — R197 Diarrhea, unspecified: Secondary | ICD-10-CM | POA: Insufficient documentation

## 2017-05-05 DIAGNOSIS — E871 Hypo-osmolality and hyponatremia: Secondary | ICD-10-CM | POA: Diagnosis not present

## 2017-05-05 DIAGNOSIS — Z800 Family history of malignant neoplasm of digestive organs: Secondary | ICD-10-CM | POA: Insufficient documentation

## 2017-05-05 LAB — CBC WITH DIFFERENTIAL/PLATELET
BASOS ABS: 0.1 10*3/uL (ref 0–0.1)
BASOS PCT: 1 %
EOS ABS: 0.1 10*3/uL (ref 0–0.7)
EOS PCT: 1 %
HCT: 31.6 % — ABNORMAL LOW (ref 40.0–52.0)
Hemoglobin: 10.4 g/dL — ABNORMAL LOW (ref 13.0–18.0)
Lymphocytes Relative: 9 %
Lymphs Abs: 1 10*3/uL (ref 1.0–3.6)
MCH: 31.3 pg (ref 26.0–34.0)
MCHC: 32.9 g/dL (ref 32.0–36.0)
MCV: 95.1 fL (ref 80.0–100.0)
Monocytes Absolute: 0.6 10*3/uL (ref 0.2–1.0)
Monocytes Relative: 6 %
Neutro Abs: 8.9 10*3/uL — ABNORMAL HIGH (ref 1.4–6.5)
Neutrophils Relative %: 83 %
PLATELETS: 300 10*3/uL (ref 150–440)
RBC: 3.32 MIL/uL — AB (ref 4.40–5.90)
RDW: 16.3 % — AB (ref 11.5–14.5)
WBC: 10.7 10*3/uL — AB (ref 3.8–10.6)

## 2017-05-05 LAB — COMPREHENSIVE METABOLIC PANEL
ALK PHOS: 143 U/L — AB (ref 38–126)
ALT: 36 U/L (ref 17–63)
AST: 35 U/L (ref 15–41)
Albumin: 3.4 g/dL — ABNORMAL LOW (ref 3.5–5.0)
Anion gap: 10 (ref 5–15)
BILIRUBIN TOTAL: 0.5 mg/dL (ref 0.3–1.2)
BUN: 9 mg/dL (ref 6–20)
CALCIUM: 9.1 mg/dL (ref 8.9–10.3)
CO2: 23 mmol/L (ref 22–32)
CREATININE: 0.84 mg/dL (ref 0.61–1.24)
Chloride: 92 mmol/L — ABNORMAL LOW (ref 101–111)
GFR calc Af Amer: >60 mL/min (ref >60)
Glucose, Bld: 836 mg/dL (ref 65–99)
POTASSIUM: 3.4 mmol/L — AB (ref 3.5–5.1)
SODIUM: 125 mmol/L — AB (ref 135–145)
TOTAL PROTEIN: 7.2 g/dL (ref 6.5–8.1)

## 2017-05-05 MED ORDER — OXYCODONE HCL 10 MG PO TABS: 10.0000 mg | ORAL_TABLET | Freq: Four times a day (QID) | ORAL | 0 refills | Status: DC | PRN

## 2017-05-05 MED ORDER — PANCRELIPASE (LIP-PROT-AMYL) 36000-114000 UNITS PO CPEP: 36000.0000 [IU] | ORAL_CAPSULE | Freq: Three times a day (TID) | ORAL | 3 refills | Status: AC

## 2017-05-05 NOTE — Progress Notes (Signed)
Pt in today for follow up, had surgery at [facility] since last visit.  Incision to abdomen healing, but has 2 small open areas with drainage.  Pt reports eating but has diarrhea all day long and is not improved with lomotil.  MD notified.

## 2017-05-05 NOTE — Progress Notes (Signed)
[name]:  Patient Care Team: [name], MD as PCP - General (Internal Medicine) Clent Jacks, RN as Registered Nurse  CHIEF COMPLAINTS/PURPOSE OF CONSULTATION: Follow up on treatment of colon cancer  HISTORY OF PRESENTING ILLNESS: [name] 56 y.o. male with PMH listed below is referred by Dr.Davis to me for evaluation of recently diagnosed colorectal cancer. Patient reports that he has been having significant weight loss, abdominal pain, persistent diarrhea for past few months. And more recently he lost 15 pounds in the past 6 weeks.  He had GI work up including EGD and colonoscopy in June 2018 which revealed non obstructing large mass in sigmoid colon, several colon polyps, one rectal polyp and a large sessile mass in cardia.  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 Rectal polyp showed small fragments of adenocarcinoma.  11/30/2016 CT abdomen and pelvis with contrast showed no evidence of metastatic disease.  11/30/2016 CT head showed no acute intracranial abnormality Due to non compliancy, he has not done any advised follow up or work up till now. He was seen by Dr.Davis on 01/15/2017.  Today he reports abdominal pain which he rates 9 out of 10. He requests to get pain medication. He used to follow up with a pain clinic and due to noncompliance with appointment, he was discharged from there and he reports not having any pain medication and has tried tylenol with no relief of his pain.  Patient otherwise denies any fever/chills, CP, or any worsening of his chronic SOB. He denies drinking alcohol, however per previous note, his family members reported that he continues to drink 5-6 beers a day and more excessively on weekends. Today his girlfriend drop him to cancer  center and left. Patient has not get the [place] lower extremity doppler done which was ordered by Dr.Davis yesterday due to the concern of lower extremity tenderness and swelling.  Patient has significant family history including father died at age of 63 with colon cancer and his brother died at age of age of 78 due to colon cancer. Per patient, they did not get genetic testing.  Patient is single, lives with his mother who is 78 years old. He has total 4 children, youngest kid is 88 years old who does not live with him.   # he has now completed his workup with flexible sigmoidoscopy demonstrating no additional rectal mass for biopsy (initial rectal polyp biopsy showed microscopic adenocarcinoma concerning for contamination from sigmoid adenocarcinoma biopsy). He is referred to [facility] surgery and has been schedule for surgery on 03/10/2017.   04/17/2017 s/p low anterior resection with primary reanastomosis for rectosigmoid adenocarcinoma. Intraoperatively a moderate sized mass was found about 12 cm from the anal verge and there was no evidence of carcinomatosis or liver metastasis. Patient was released and discharged on 04/21/2017.  Interval history Patient presented for follow-up of her colon cancer and discussion for adjuvant chemotherapy treatment. Patient is status post resection on 04/17/2017. Pathology reviewed stage III colon cancer. Patient reports not feeling well today with stomach upset. He asks for pain medication for both chronic epigastric pain as well as the wound pain. Denies any bone discharges, fever or chills. He continues to have chronic diarrhea.    Review of Systems  Constitutional: Positive for fatigue. Negative for chills and fever.  HENT:  Negative.  Negative for lump/mass.   Eyes: Negative.  Negative for eye problems.  Respiratory: Negative.  Negative for chest tightness and cough.   Cardiovascular: Negative.   Gastrointestinal: Positive for abdominal pain and diarrhea. Negative  for abdominal distention.  Endocrine: Negative.   Genitourinary: Negative.    Musculoskeletal: Negative.   Skin: Negative.   Neurological: Negative.   Hematological: Negative.   Psychiatric/Behavioral: Negative.     MEDICAL HISTORY: Past Medical History:  Diagnosis Date  . Anemia   . BPH without obstruction/lower urinary tract symptoms   . Cancer ([place])   . Cellulitis of scrotum   . Cyst of prostate 05/13/2015  . Diabetes ([place])   . Difficulty urinating   . Dyspnea   . Epididymoorchitis   . ETOH abuse   . Fracture of right tibia and fibula   . GERD (gastroesophageal reflux disease)   . GI bleed   . Malignant neoplasm of sigmoid colon ([place])   . Pancreatitis   . Trichomoniasis   . Wears dentures    Partial upper and lower.  reports poor fit.    SURGICAL HISTORY: Past Surgical History:  Procedure Laterality Date  . FRACTURE SURGERY     TIBIA AND FIBULA  . FRACTURE SURGERY    . resection of pancreas    . SCROTAL EXPLORATION      SOCIAL HISTORY: Social History   Socioeconomic History  . Marital status: Single    Spouse name: Not on file  . Number of children: Not on file  . Years of education: Not on file  . Highest education level: Not on file  Social Needs  . Financial resource strain: Not on file  . Food insecurity - worry: Not on file  . Food insecurity - inability: Not on file  . Transportation needs - medical: Not on file  . Transportation needs - non-medical: Not on file  Occupational History  . Not on file  Tobacco Use  . Smoking status: Current Every Day Smoker    Packs/day: 0.25    Years: 41.00    Pack years: 10.25    Types: Cigarettes  . Smokeless tobacco: Never Used  . Tobacco comment: since age 56  Substance and Sexual Activity  . Alcohol use: Yes    Alcohol/week: 0.0 oz    Comment: occasionally - but says not drinkning currently  . Drug use: No  . Sexual activity: Not on file  Other Topics Concern  . Not on file  Social History  Narrative  . Not on file    FAMILY HISTORY Family History  Problem Relation Age of Onset  . Cancer - Colon Father   . Colon cancer Brother     ALLERGIES:  is allergic to aspirin.  MEDICATIONS:  Current Outpatient Medications  Medication Sig Dispense Refill  . amitriptyline (ELAVIL) 25 MG tablet Take 25 mg by mouth.    . B-D UF III MINI PEN NEEDLES 31G X 5 MM MISC   1  . cyclobenzaprine (FLEXERIL) 10 MG tablet Take 10 mg by mouth 2 (two) times daily.    . diphenoxylate-atropine (LOMOTIL) 2.5-0.025 MG tablet take 1 tablet four times a day if needed    . LANTUS SOLOSTAR 100 UNIT/ML Solostar Pen Inject 10 Units into the skin 2 (two) times daily. (Patient taking differently: Inject 10 Units into the skin daily at 10 pm. ) 15 mL 1  . lipase/protease/amylase (CREON) 36000 UNITS CPEP capsule Take 1 capsule (36,000 Units total) 3 (three) times  daily before meals by mouth. 180 capsule 3  . NOVOLOG FLEXPEN 100 UNIT/ML FlexPen Inject 48 Units into the skin 3 (three) times daily with meals.   1  . Oxycodone HCl 10 MG TABS Take 1 tablet (10 mg total) every 6 (six) hours as needed by mouth. 60 tablet 0  . Vitamins/Minerals TABS Take by mouth.    . fluticasone (FLONASE) 50 MCG/ACT nasal spray 2 sprays by Each Nare route daily.    . traZODone (DESYREL) 50 MG tablet take 1 tablet by mouth at bedtime TO HELP WITH SLEEP     No current facility-administered medications for this Gardner.     PHYSICAL EXAMINATION:  ECOG PERFORMANCE STATUS: 1 - Symptomatic but completely ambulatory   Vitals:   05/05/17 1135  BP: 114/81  Pulse: 89  Resp: 18  Temp: (!) 96.8 F (36 C)    Filed Weights   05/05/17 1135  Weight: 114 lb (51.7 kg)     Physical Exam GENERAL: No distress, well nourished.  SKIN:  No rashes or significant lesions  HEAD: Normocephalic, No masses, lesions, tenderness or abnormalities  EYES: Conjunctiva are pink, non icteric ENT: External ears normal ,lips , buccal mucosa, and tongue  normal and mucous membranes are moist  LYMPH: No palpable cervical and axillary lymphadenopathy  LUNGS: Clear to auscultation, no crackles or wheezes HEART: Regular rate & rhythm, no murmurs, no gallops, S1 normal and S2 normal  ABDOMEN: Abdomen soft, mild tender, normal bowel sounds, well healed surgical scar with small area of enduration.   MUSCULOSKELETAL: No CVA tenderness and no tenderness on percussion of the back or rib cage.  EXTREMITIES: No edema, no skin discoloration or tenderness NEURO: Alert & oriented, no focal motor/sensory deficits.   LABORATORY DATA: I have personally reviewed the data as listed:  Office Gardner on 05/05/2017  Component Date Value Ref Range Status  . Sodium 05/05/2017 125* 135 - 145 mmol/L Final  . Potassium 05/05/2017 3.4* 3.5 - 5.1 mmol/L Final  . Chloride 05/05/2017 92* 101 - 111 mmol/L Final  . CO2 05/05/2017 23  22 - 32 mmol/L Final  . Glucose, Bld 05/05/2017 836* 65 - 99 mg/dL Final   Comment: RESULTS VERIFIED BY REPEAT TESTING CRITICAL RESULT CALLED TO, READ BACK BY AND VERIFIED WITH JENNY BURNS AT 13:10 05/05/2017 KMR   . BUN 05/05/2017 9  6 - 20 mg/dL Final  . Creatinine, Ser 05/05/2017 0.84  0.61 - 1.24 mg/dL Final  . Calcium 05/05/2017 9.1  8.9 - 10.3 mg/dL Final  . Total Protein 05/05/2017 7.2  6.5 - 8.1 g/dL Final  . Albumin 05/05/2017 3.4* 3.5 - 5.0 g/dL Final  . AST 05/05/2017 35  15 - 41 U/L Final  . ALT 05/05/2017 36  17 - 63 U/L Final  . Alkaline Phosphatase 05/05/2017 143* 38 - 126 U/L Final  . Total Bilirubin 05/05/2017 0.5  0.3 - 1.2 mg/dL Final  . GFR calc non Af Amer 05/05/2017 >60  >60 mL/min Final  . GFR calc Af Amer 05/05/2017 >60  >60 mL/min Final   Comment: (NOTE) The eGFR has been calculated using the CKD EPI equation. This calculation has not been validated in all clinical situations. eGFR's persistently <60 mL/min signify possible Chronic Kidney Disease.   . Anion gap 05/05/2017 10  5 - 15 Final  . WBC 05/05/2017  10.7* 3.8 - 10.6 K/uL Final  . RBC 05/05/2017 3.32* 4.40 - 5.90 MIL/uL Final  . Hemoglobin 05/05/2017 10.4* 13.0 - 18.0 g/dL  Final  . HCT 05/05/2017 31.6* 40.0 - 52.0 % Final  . MCV 05/05/2017 95.1  80.0 - 100.0 fL Final  . MCH 05/05/2017 31.3  26.0 - 34.0 pg Final  . MCHC 05/05/2017 32.9  32.0 - 36.0 g/dL Final  . RDW 05/05/2017 16.3* 11.5 - 14.5 % Final  . Platelets 05/05/2017 300  150 - 440 K/uL Final  . Neutrophils Relative % 05/05/2017 83  % Final  . Neutro Abs 05/05/2017 8.9* 1.4 - 6.5 K/uL Final  . Lymphocytes Relative 05/05/2017 9  % Final  . Lymphs Abs 05/05/2017 1.0  1.0 - 3.6 K/uL Final  . Monocytes Relative 05/05/2017 6  % Final  . Monocytes Absolute 05/05/2017 0.6  0.2 - 1.0 K/uL Final  . Eosinophils Relative 05/05/2017 1  % Final  . Eosinophils Absolute 05/05/2017 0.1  0 - 0.7 K/uL Final  . Basophils Relative 05/05/2017 1  % Final  . Basophils Absolute 05/05/2017 0.1  0 - 0.1 K/uL Final    RADIOGRAPHIC STUDIES: I have personally reviewed the radiological images as listed and agree with the findings in the report 01/15/2017 [place] bilateral lower extremity doppler: No lower extremity DVT in either side.  11/30/2016: CT abdomen pelvis w contrast: Stable chronic changes as described above.Small gastrohepatic ligament lymph nodes slightly more prominent than that seen on the prior exam likely of a reactive nature. 11/30/2016: CT head wo contrast: Chronic stable small vessel ischemic disease of periventricular white matter. No acute intracranial abnormality. 01/23/2017 CT chest abdomen pelvis w contrast 1. The sigmoid colon mass is difficult to perceive. No definite adenopathy in this vicinity. No liver metastatic disease is identified. 2. Chronically stable right hepatic lobe hemangioma. 3. I am suspicious of hepatic steatosis. 4. Mildly enlarged right gastric lymph node at 1.1 cm, previously 1.2 cm, significance uncertain. 5. Other imaging findings of potential clinical significance: 7  mm hypodense lesion in the right kidney upper pole is technically nonspecific although statistically likely to be a cyst. Descending colon diverticulosis. Aortic Atherosclerosis (ICD10-I70.0). Chronic crescentic hypodensity in the right prostate gland, possibly a cyst or some type of urethral diverticulum. Lower lumbar degenerative disc disease.  [facility] pathology A: Colon, sigmoid and rectum, low anterior resection  - Invasive moderately differentiated adenocarcinoma of the proximal rectum, 4.5 cm, with transmural invasion focally into perirectal soft tissue (see comment and synoptic report) - Angiolymphatic invasion present - No perineural invasion identified - Metastatic carcinoma involves one of thirty-two regional lymph nodes (1/32) - Surgical margins uninvolved - Separate tubular adenoma, 0.8 cm TUMOR  Tumor Site:Rectum   Tumor Location:Straddles the anterior peritoneal reflection   Histologic Type:Adenocarcinoma   Histologic Grade:G2: Moderately differentiated   Tumor Size:Greatest dimension in Centimeters (cm): 4.5 Centimeters (cm)  Additional Dimension in Centimeters (cm):1.5 Centimeters (cm)  Additional Dimension in Centimeters (cm):0.8 Centimeters (cm)  Tumor Deposits:Not identified   Tumor Extent:  Tumor Extension:No evidence of primary tumor   Macroscopic Tumor Perforation:Not identified   Accessory Findings:  Lymphovascular Invasion:Present   :Small vessel lymphovascular invasion   Perineural Invasion:Not identified   Tumor Budding:Number of tumor buds in one 'hotspot' field (total number in area = 0.785 mm2): 5   :Intermediate score (5-9)   Type of Polyp in Which Invasive Carcinoma Arose:None identified   Treatment Effect:No known presurgical therapy   MARGINS  Margins:All margins are uninvolved by invasive carcinoma,  high-grade dysplasia, intramucosal adenocarcinoma, and adenoma   Margins Examined:Proximal   Margins Examined:Distal   Margins Examined:Radial or Mesenteric  Distance of Invasive Carcinoma from Closest Margin:1.0 Centimeters (cm)  Closest Margin:Radial or Mesenteric   Distance of Tumor from Radial Margin:1.0 Centimeters (cm)  Distance of Tumor from Distal Margin:3.5 Centimeters (cm)  LYMPH NODES  Number of Lymph Nodes Involved:1   Number of Lymph Nodes Examined:32   PATHOLOGIC STAGE CLASSIFICATION (pTNM, AJCC 8th Edition)  TNM Descriptors:Not applicable   Primary Tumor (pT):pT3   Regional Lymph Nodes (pN):pN1    Mismatch Repair:  Immunohistochemistry (IHC) Testing for Mismatch Repair (MMR) Proteins:MLH1  MLH1 Result:Intact nuclear expression  Immunohistochemistry (IHC) Testing for Mismatch Repair (MMR) Proteins:MSH2  MSH2 Result:Intact nuclear expression  Immunohistochemistry (IHC) Testing for Mismatch Repair (MMR) Proteins:MSH6  MSH6 Result:Intact nuclear expression  Immunohistochemistry (IHC) Testing for Mismatch Repair (MMR) Proteins:PMS2  PMS2 Result:Intact nuclear expression  Immunohistochemistry (IHC) Testing for Mismatch Repair (MMR) Proteins:Background nonneoplastic tissue / internal control with intact nuclear expression  IHC Interpretation:No loss of nuclear expression of MMR proteins: low probability of MSI-H      ASSESSMENT/PLAN   1. Malignant neoplasm of sigmoid colon ([place])   2. Moderate protein-calorie malnutrition ([place])   3. Type 2 diabetes mellitus with hyperglycemia, with long-term current use of insulin ([place])   4. Noncompliance   5. ETOH abuse   6. Pancreatic insufficiency   7. Chronic abdominal pain   8. Chronic diarrhea   9. Tobacco dependence   .  Plan:  # pT3N1  colorectal cancer.  Pathology results were discussed with patient. I discussed with him about adjuvant chemotherapy with 5-FU-containing regimen. Today we'll recheck a CEA, CBC, CMP.  # Patient has chronic epigastric pain due to chronic pancreatitis, continue his pain medication, 03/19/2017 refilled oxycodone 41m Q6 hour PRN, dispense 60 pill.  # He has ongoing diarrhea from pancreatic dysfunction as well as colon cancer. Continue Creon to 36,000 units TID, lomotil PRN. Referred to GI for further evaluation titration of Creon # Referred to Dr. DRosana Hoesfor Mediport placement. # Chemotherapy class for FOLFOX. # Set up follow-up appointments with the nutritionist.  # He has a strong history of family history of colon cancer, needs genetic counseling. Today he doesn't feel well and initially does not even want to do blood work. We will revisit this in the future.  # Follow up with primary care provider for tighter control of DM.  All questions were answered. The patient knows to call the clinic with any problems, questions or concerns. Return Gardner in 2 weeks weeks   Addendum: Lab results showed hyperglycemia of glucose level of 800s, hyponatremia 125, likely due to hyperglycemia. Called patient and advised patient to go to emergency room for treatment of hyperglycemia. He voices understanding.   Dr. ZEarlie Server MD, PhD CLee [facility] Bae [facility] Corporationat [facility] OmahaPager- [phone]/11/2016

## 2017-05-05 NOTE — Telephone Encounter (Signed)
Lab added for today, per 05/05/17 los.  MD/Nutritionist on 05/25/17 (per Julie/Verbal) , per 05/05/17 los. MF

## 2017-05-05 NOTE — Telephone Encounter (Signed)
Called pt to notify him that his glucose today was 836.  Advised pt to go to the ED as request by Dr Tasia Catchings, Patient states that he will go seek care at the ED.

## 2017-05-06 ENCOUNTER — Other Ambulatory Visit: Payer: Self-pay | Admitting: *Deleted

## 2017-05-06 DIAGNOSIS — C187 Malignant neoplasm of sigmoid colon: Secondary | ICD-10-CM

## 2017-05-06 LAB — CEA: CEA: 16.9 ng/mL — ABNORMAL HIGH (ref 0.0–4.7)

## 2017-05-06 NOTE — Telephone Encounter (Signed)
Great.  Thanks

## 2017-05-07 ENCOUNTER — Encounter: Payer: Self-pay | Admitting: Gastroenterology

## 2017-05-08 ENCOUNTER — Other Ambulatory Visit: Payer: Self-pay

## 2017-05-08 ENCOUNTER — Telehealth: Payer: Self-pay | Admitting: *Deleted

## 2017-05-08 DIAGNOSIS — F1010 Alcohol abuse, uncomplicated: Secondary | ICD-10-CM

## 2017-05-08 DIAGNOSIS — F17200 Nicotine dependence, unspecified, uncomplicated: Secondary | ICD-10-CM

## 2017-05-08 DIAGNOSIS — K529 Noninfective gastroenteritis and colitis, unspecified: Secondary | ICD-10-CM

## 2017-05-08 DIAGNOSIS — R109 Unspecified abdominal pain: Secondary | ICD-10-CM

## 2017-05-08 DIAGNOSIS — E440 Moderate protein-calorie malnutrition: Secondary | ICD-10-CM

## 2017-05-08 DIAGNOSIS — Z794 Long term (current) use of insulin: Secondary | ICD-10-CM

## 2017-05-08 DIAGNOSIS — G8929 Other chronic pain: Secondary | ICD-10-CM

## 2017-05-08 DIAGNOSIS — E1165 Type 2 diabetes mellitus with hyperglycemia: Secondary | ICD-10-CM

## 2017-05-08 DIAGNOSIS — Z91199 Patient's noncompliance with other medical treatment and regimen due to unspecified reason: Secondary | ICD-10-CM

## 2017-05-08 DIAGNOSIS — K8689 Other specified diseases of pancreas: Secondary | ICD-10-CM

## 2017-05-08 DIAGNOSIS — C187 Malignant neoplasm of sigmoid colon: Secondary | ICD-10-CM

## 2017-05-08 DIAGNOSIS — Z9119 Patient's noncompliance with other medical treatment and regimen: Secondary | ICD-10-CM

## 2017-05-08 MED ORDER — OXYCODONE HCL 10 MG PO TABS: 10.0000 mg | ORAL_TABLET | Freq: Four times a day (QID) | ORAL | 0 refills | Status: DC | PRN

## 2017-05-08 NOTE — Telephone Encounter (Signed)
Per Dr Tasia Catchings will fill prescription for # 30 tabs. Patient advised

## 2017-05-08 NOTE — Telephone Encounter (Signed)
Patient reports that he lost his prescription for Oxycodone given to him on 11/6 and is asking for a new prescription to be written. I checked with Pharmcy who states last time Oxycodone was filled was 10/25 and she checked the [place] data base and nothing since 10/25 has been filled. Please advise

## 2017-05-11 NOTE — Patient Instructions (Signed)

## 2017-05-12 ENCOUNTER — Inpatient Hospital Stay: Payer: Medicaid Other

## 2017-05-14 ENCOUNTER — Telehealth: Payer: Self-pay | Admitting: Surgery

## 2017-05-14 ENCOUNTER — Encounter: Payer: Self-pay | Admitting: Surgery

## 2017-05-14 ENCOUNTER — Ambulatory Visit (INDEPENDENT_AMBULATORY_CARE_PROVIDER_SITE_OTHER): Payer: Medicaid Other | Admitting: Surgery

## 2017-05-14 VITALS — BP 129/89 | HR 80 | Temp 98.1°F | Ht 68.0 in | Wt 114.0 lb

## 2017-05-14 DIAGNOSIS — Z01818 Encounter for other preprocedural examination: Secondary | ICD-10-CM | POA: Diagnosis not present

## 2017-05-14 DIAGNOSIS — C20 Malignant neoplasm of rectum: Secondary | ICD-10-CM | POA: Diagnosis not present

## 2017-05-14 NOTE — Telephone Encounter (Signed)
Pt advised of pre op date/time and sx date. Sx: 05/18/17 --Dr Diego Cory placement.  Pre op: 05/15/17 between 9-1:00am--Phone interview.   Patient made aware to call [phone], between 1-3:00pm the day before surgery, to find out what time to arrive.

## 2017-05-14 NOTE — Progress Notes (Signed)
Surgical Clinic Progress/Follow-up Note   HPI:  56 y.o. Male presents to clinic for to discuss placement of port for chemotherapy advised by [facility], his surgical oncologist, and his primary oncologist (Dr. Tasia Catchings) at [facility]. Patient reports he recently underwent laparoscopic-assisted LAR at [facility] for high rectal adenocarcinoma first diagnosed during the first week of June, 2018. 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 Patient denies any prior chronic indwelling venous catheters, history of upper extremity DVT, or any unilateral upper extremity or facial edema.  Review of Systems:  Constitutional: denies any other weight loss, fever, chills, or sweats  Eyes: denies any other vision changes, history of eye injury  ENT: denies sore throat, hearing problems  Respiratory: denies shortness of breath, wheezing  Cardiovascular: denies chest pain, palpitations  Gastrointestinal: abdominal pain, N/V, and bowel function as per HPI Musculoskeletal: denies any other joint pains or cramps  Skin: Denies any other rashes or skin discolorations  Neurological: denies any other headache, dizziness, weakness  Psychiatric: denies any other depression, anxiety  All other review of systems: otherwise negative   Vital Signs:  BP 129/89   Pulse 80   Temp 98.1 F (36.7 C) (Oral)   Ht 5\' 8"  (1.727 m)   Wt 114 lb (51.7 kg)   BMI 17.33 kg/m    Physical Exam:  Constitutional:  -- Normal body habitus  -- Awake, alert, and oriented x3  Eyes:  -- Pupils equally round and reactive to light  -- No scleral icterus  Ear, nose, throat:  -- No jugular venous distension  -- No  nasal drainage, bleeding Pulmonary:  -- No crackles -- Equal breath sounds bilaterally -- Breathing non-labored at rest Cardiovascular:  -- S1, S2 present  -- No pericardial rubs  Gastrointestinal:  -- Soft and non-distended with minimal-/mild- epigastric abdominal tenderness to palpation, no guarding/rebound -- Post-surgical lower abdominal wall incisions well-approximated and healing well without erythema or drainage -- No abdominal masses appreciated, pulsatile or otherwise  Musculoskeletal / Integumentary:  -- Wounds or skin discoloration: None appreciated except post-surgical incisions sites as described above (GI)  -- Extremities: B/L UE and LE FROM, hands and feet warm, no edema  Neurologic:  -- Motor function: intact and symmetric  -- Sensation: intact and symmetric   Laboratory studies:  CBC:  Lab Results  Component Value Date   WBC 10.7 (H) 05/05/2017   RBC 3.32 (L) 05/05/2017   BMP:  Lab Results  Component Value Date   GLUCOSE 836 (HH) 05/05/2017   GLUCOSE 293 (H) 07/20/2014   CO2 23 05/05/2017   CO2 23 07/20/2014   BUN 9 05/05/2017   BUN 6 (L) 07/20/2014   CREATININE 0.84 05/05/2017   CREATININE 0.80 07/20/2014   CALCIUM 9.1 05/05/2017   CALCIUM 9.1 07/20/2014     Imaging: No pertinent imaging studies available for review   Assessment:  56 y.o. yo Male with a problem list including...  Patient Active Problem List   Diagnosis Date Noted  . Tobacco use disorder 04/21/2017  . Rectal cancer ([place]) 04/16/2017  . Moderate malnutrition ([place]) 02/12/2017  . Pancreatic insufficiency 02/12/2017  . Chronic abdominal pain 01/29/2017  . Malignant neoplasm of sigmoid colon ([place]) 01/15/2017  . Shock ([place]) 12/02/2016  .  Acute posthemorrhagic anemia   . Benign neoplasm of ascending colon   . Colorectal polyps   . Noninfectious diarrhea   . Syncope 12/01/2016  . Chronic diarrhea 12/01/2016  . Pancreatitis 04/17/2016  . Noncompliance 07/05/2015  . Tobacco  dependence 07/05/2015  . Fracture of right tibia and fibula 06/05/2015  . Type 2 diabetes mellitus with hyperglycemia ([place]) 06/05/2015  . Prostatic cyst 05/13/2015  . GI bleed 05/12/2015  . Type 2 diabetes mellitus ([place]) 05/12/2015  . GERD (gastroesophageal reflux disease) 05/12/2015  . ETOH abuse 05/12/2015  . BPH (benign prostatic hyperplasia) 05/12/2015    presents to to discuss placement of port for chemotherapy [facility], his surgical oncologist, and his primary oncologist (Dr. Tasia Catchings) at [facility] s/p laparoscopic-assisted LAR for stage 3 upper rectal adenocarcinoma, complicated by comorbidities including alcohol-associated pancreatitis s/p distal pancreatectomy, chronic tobacco abuse, challenging compliance with medical management of his cancer and comorbidities, chronic diarrhea, chronic anemia, brittle DM, dyspnea with exertion, poor dentition, GERD, and BPH.  Plan:   - patient initially expressed he was told he didn't need chemotherapy, but acknowledge its importance when notes from [facility], [facility] surgical oncology, and patient's primary oncologist at [facility] (Dr. Tasia Catchings) were reviewed with patient, advising recommendation for chemotherapy   - all risks, benefits, and alternatives to elective placement of central venous catheter with subcutaneous port were discussed with the patient and his family, all of their questions were answered to their expressed satisfaction, patient expresses he wishes to proceed, and informed consent was obtained.  - will plan for placement of central venous catheter with subcutaneous port asap per OR availability, Monday 11/19  - anticipate post-op follow-up 2 weeks after above elective procedure  All of the above recommendations were discussed with the patient and patient's family, and all of patient's and family's questions were answered to their expressed satisfaction.  -- Marilynne Drivers Rosana Hoes, MD, [place]: [facility] - Partnering for exceptional care. Office: [phone]

## 2017-05-14 NOTE — H&P (View-Only) (Signed)
Surgical Clinic Progress/Follow-up Note   HPI:  56 y.o. Male presents to clinic for to discuss placement of port for chemotherapy advised by [facility], his surgical oncologist, and his primary oncologist (Dr. Tasia Catchings) at [facility]. Patient reports he recently underwent laparoscopic-assisted LAR at [facility] for high rectal adenocarcinoma first diagnosed during the first week of June, 2018. 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 Patient denies any prior chronic indwelling venous catheters, history of upper extremity DVT, or any unilateral upper extremity or facial edema.  Review of Systems:  Constitutional: denies any other weight loss, fever, chills, or sweats  Eyes: denies any other vision changes, history of eye injury  ENT: denies sore throat, hearing problems  Respiratory: denies shortness of breath, wheezing  Cardiovascular: denies chest pain, palpitations  Gastrointestinal: abdominal pain, N/V, and bowel function as per HPI Musculoskeletal: denies any other joint pains or cramps  Skin: Denies any other rashes or skin discolorations  Neurological: denies any other headache, dizziness, weakness  Psychiatric: denies any other depression, anxiety  All other review of systems: otherwise negative   Vital Signs:  BP 129/89   Pulse 80   Temp 98.1 F (36.7 C) (Oral)   Ht 5\' 8"  (1.727 m)   Wt 114 lb (51.7 kg)   BMI 17.33 kg/m    Physical Exam:  Constitutional:  -- Normal body habitus  -- Awake, alert, and oriented x3  Eyes:  -- Pupils equally round and reactive to light  -- No scleral icterus  Ear, nose, throat:  -- No jugular venous distension  -- No  nasal drainage, bleeding Pulmonary:  -- No crackles -- Equal breath sounds bilaterally -- Breathing non-labored at rest Cardiovascular:  -- S1, S2 present  -- No pericardial rubs  Gastrointestinal:  -- Soft and non-distended with minimal-/mild- epigastric abdominal tenderness to palpation, no guarding/rebound -- Post-surgical lower abdominal wall incisions well-approximated and healing well without erythema or drainage -- No abdominal masses appreciated, pulsatile or otherwise  Musculoskeletal / Integumentary:  -- Wounds or skin discoloration: None appreciated except post-surgical incisions sites as described above (GI)  -- Extremities: B/L UE and LE FROM, hands and feet warm, no edema  Neurologic:  -- Motor function: intact and symmetric  -- Sensation: intact and symmetric   Laboratory studies:  CBC:  Lab Results  Component Value Date   WBC 10.7 (H) 05/05/2017   RBC 3.32 (L) 05/05/2017   BMP:  Lab Results  Component Value Date   GLUCOSE 836 (HH) 05/05/2017   GLUCOSE 293 (H) 07/20/2014   CO2 23 05/05/2017   CO2 23 07/20/2014   BUN 9 05/05/2017   BUN 6 (L) 07/20/2014   CREATININE 0.84 05/05/2017   CREATININE 0.80 07/20/2014   CALCIUM 9.1 05/05/2017   CALCIUM 9.1 07/20/2014     Imaging: No pertinent imaging studies available for review   Assessment:  56 y.o. yo Male with a problem list including...  Patient Active Problem List   Diagnosis Date Noted  . Tobacco use disorder 04/21/2017  . Rectal cancer ([place]) 04/16/2017  . Moderate malnutrition ([place]) 02/12/2017  . Pancreatic insufficiency 02/12/2017  . Chronic abdominal pain 01/29/2017  . Malignant neoplasm of sigmoid colon ([place]) 01/15/2017  . Shock ([place]) 12/02/2016  .  Acute posthemorrhagic anemia   . Benign neoplasm of ascending colon   . Colorectal polyps   . Noninfectious diarrhea   . Syncope 12/01/2016  . Chronic diarrhea 12/01/2016  . Pancreatitis 04/17/2016  . Noncompliance 07/05/2015  . Tobacco  dependence 07/05/2015  . Fracture of right tibia and fibula 06/05/2015  . Type 2 diabetes mellitus with hyperglycemia ([place]) 06/05/2015  . Prostatic cyst 05/13/2015  . GI bleed 05/12/2015  . Type 2 diabetes mellitus ([place]) 05/12/2015  . GERD (gastroesophageal reflux disease) 05/12/2015  . ETOH abuse 05/12/2015  . BPH (benign prostatic hyperplasia) 05/12/2015    presents to to discuss placement of port for chemotherapy advised by [facility], his surgical oncologist, and his primary oncologist (Dr. Tasia Catchings) at [facility] s/p laparoscopic-assisted LAR for stage 3 upper rectal adenocarcinoma, complicated by comorbidities including alcohol-associated pancreatitis s/p distal pancreatectomy, chronic tobacco abuse, challenging compliance with medical management of his cancer and comorbidities, chronic diarrhea, chronic anemia, brittle DM, dyspnea with exertion, poor dentition, GERD, and BPH.  Plan:   - patient initially expressed he was told he didn't need chemotherapy, but acknowledge its importance when notes from [facility], [facility] surgical oncology, and patient's primary oncologist at [facility] (Dr. Tasia Catchings) were reviewed with patient, advising recommendation for chemotherapy   - all risks, benefits, and alternatives to elective placement of central venous catheter with subcutaneous port were discussed with the patient and his family, all of their questions were answered to their expressed satisfaction, patient expresses he wishes to proceed, and informed consent was obtained.  - will plan for placement of central venous catheter with subcutaneous port asap per OR availability, Monday 11/19  - anticipate post-op follow-up 2 weeks after above elective procedure  All of the above recommendations were discussed with the patient and patient's family, and all of patient's and family's questions were answered to their expressed satisfaction.  -- Marilynne Drivers Rosana Hoes, MD, [place]: [facility] - Partnering for exceptional care. Office: [phone]

## 2017-05-14 NOTE — Patient Instructions (Signed)
We have seen you today and have spoken about your port placement. This has been scheduled for 11/19 at [facility] by Dr. Rosana Hoes.  Please see the [facility]) Sheet provided for further details.  Please call our office with any questions or concerns that you have.   [facility]) A central line is a soft, flexible tube (catheter) that can be used to collect blood for testing or to give medicine or nutrition through a vein. The tip of the central line ends in a large vein just above the heart called the vena cava. A central line may be placed because:  You need to get medicines or fluids through an IV tube for a long period of time.  You need nutrition but cannot eat or absorb nutrients.  The veins in your hands or arms are hard to access.  You need to have blood taken often for blood tests.  You need a blood transfusion  You need chemotherapy or dialysis.  There are many types of central lines:  Peripherally inserted central catheter (PICC) line. This type is used for intermediate access to long-term access of one week or more. It can be used to draw blood and give fluids or medicines. A PICC looks like an IV tube, but it goes up the arm to the heart. It is usually inserted in the upper arm and taped in place on the arm.  Tunneled central line. This type is used for long-term therapy and dialysis. It is placed in a large vein in the neck, chest, or groin. A tunneled central line is inserted through a small incision made over the vein and is advanced into the heart. It is tunneled beneath the skin and brought out through a second incision.  Non-tunneled central line. This type is used for short-term access, usually of a maximum of 7 days. It is often used in the emergency department. A non-tunneled central line is inserted in the neck, chest, or groin.  Implanted port. This type is used for long-term therapy. It can stay in place longer than other types of central lines. An  implanted port is normally inserted in the upper chest but can also be placed in the upper arm or in the abdomen. It is inserted and removed with surgery, and it is accessed using a special needle.  The type of central line that you receive depends on how long you will need it, your medical condition, and the condition of your veins. What are the risks? Using any type of central line has risks that you should be aware of, including:  Infection.  A blood clot that blocks the central line or forms in the vein and travels to the heart.  Bleeding from the place where the central line was put in.  Developing a hole or crack within the central line. If this happens, the central line will need to be replaced.  Developing an abnormal heart rhythm (arrhythmia). This is rare.  Central line failure.  Follow these instructions at home: Flushing and cleaning the central line  Follow instructions from the health care provider about flushing and cleaning the central line.  Wear a mask when flushing or cleaning the central line.  Before you flush or clean the central line: ? Wash your hands with soap and water. ? Clean the central line hub with rubbing alcohol. Insertion site care  Keep the insertion site of your central line clean and dry at all times.  Check your incision or central  line site every day for signs of infection. Check for: ? More redness, swelling, or pain. ? More fluid or blood. ? Warmth. ? Pus or a bad smell. General instructions  Follow instructions from your health care provider for the type of device that you have.  If the central line accidentally gets pulled on, make sure: ? The bandage (dressing) is okay. ? There is no bleeding. ? The line has not been pulled out.  Return to your normal activities as told by your health care provider. Ask your health care provider what activities are safe for you. You may be restricted from lifting or making repetitive arm  movements on the side with the catheter.  Do not swim or bathe unless your health care provider approves.  Keep your dressing dry. Your health care provider can instruct you about how to keep your specific type of dressing from getting wet.  Keep all follow-up visits as told by your health care provider. This is important. Contact a health care provider if:  You have more redness, swelling, or pain around your incision.  You have more fluid or blood coming from your incision.  Your incision feels warm to the touch.  You have pus or a bad smell coming from your incision. Get help right away if:  You have: ? Chills. ? A fever. ? Shortness of breath. ? Trouble breathing. ? Chest pain. ? Swelling in your neck, face, chest, or arm on the side of your central line.  You are coughing.  You feel your heart beating rapidly or skipping beats.  You feel dizzy or you faint.  Your incision or central line site has red streaks spreading away from the area.  Your incision or central line site is bleeding and does not stop.  Your central line is difficult to flush or will not flush.  You do not get a blood return from the central line.  Your central line gets loose or comes out.  Your central line gets damaged.  Your catheter leaks when flushed or when fluids are infused into it. This information is not intended to replace advice given to you by your health care provider. Make sure you discuss any questions you have with your health care provider. Document Released: 08/07/2005 Document Revised: 02/13/2016 Document Reviewed: 01/23/2016 Elsevier Interactive Patient Education  2017 [employer].

## 2017-05-15 ENCOUNTER — Other Ambulatory Visit: Payer: Self-pay

## 2017-05-15 ENCOUNTER — Encounter
Admission: RE | Admit: 2017-05-15 | Discharge: 2017-05-15 | Disposition: A | Discharge status: Home / Self Care | Payer: Medicaid Other | Source: Ambulatory Visit | Attending: Surgery | Admitting: Surgery

## 2017-05-15 ENCOUNTER — Telehealth: Payer: Self-pay | Admitting: Oncology

## 2017-05-15 ENCOUNTER — Encounter: Payer: Self-pay | Admitting: Anesthesiology

## 2017-05-15 NOTE — Pre-Procedure Instructions (Signed)
Pt having port placement on 05-18-17 for rectal cancer-Dr Rosana Hoes' office put pt on for phone interview today-reviewed labs and noticed glucose of 836 and sodium of 125-pt was instructed in Epic by an MD office to go to ER-  It seems as though pt never went.  Called D. Penwarden and reviewed this info with her and she said pt has to have medical clearance.  Called Angie at Dr Rosana Hoes' office and notified her of this and faxed clearance request to Dr Rosana Hoes and Kingsley Spittle office with fax confirmation received. I informed Angie I did not do phone interview and when pt gets cleared he needs to come into PAT for office visit and not phone interview as he is not healthy enough to be a phone call

## 2017-05-15 NOTE — Pre-Procedure Instructions (Signed)
ECG 12 Lead10/21/2018 [facility] Component Name Value Ref Range  EKG Systolic BP  mmHg  EKG Diastolic BP  mmHg  EKG Ventricular Rate 61 BPM  EKG Atrial Rate 61 BPM  EKG P-R Interval 104 ms  EKG QRS Duration 108 ms  EKG Q-T Interval 512 ms  EKG QTC Calculation 515 ms  EKG Calculated P Axis 33 degrees  EKG Calculated R Axis 67 degrees  EKG Calculated T Axis 68 degrees  Result Narrative  SINUS RHYTHM WITH SHORT PR INTERVAL PROLONGED QT ABNORMAL ECG WHEN COMPARED WITH ECG OF 16-Apr-2017 11:40, QT HAS LENGTHENED Confirmed by Hunt Oris ([id number]) on 04/19/2017 8:07:04 AM  Other Result Information  Interface, Rad Results In - 04/19/2017  8:07 AM EDT SINUS RHYTHM WITH SHORT PR INTERVAL PROLONGED QT ABNORMAL ECG WHEN COMPARED WITH ECG OF 16-Apr-2017 11:40, QT HAS LENGTHENED Confirmed by Hunt Oris ([id number]) on 04/19/2017 8:07:04 AM  Status Results Details   Encounter Summary  XR Chest Portable

## 2017-05-15 NOTE — Patient Instructions (Addendum)
  Your procedure is scheduled on: 05-18-17 Report to Same Day Surgery 2nd floor medical mall [facility] Entrance-take elevator on left to 2nd floor.  Check in with surgery information desk.) To find out your arrival time please call [phone] between 1PM - 3PM on 05-15-17  Remember: Instructions that are not followed completely may result in serious medical risk, up to and including death, or upon the discretion of your surgeon and anesthesiologist your surgery may need to be rescheduled.    _x___ 1. Do not eat food after midnight the night before your procedure. You may drink WATER up to 2 hours before you are scheduled to arrive at the hospital for your procedure.  Do not drink WATER within 2 hours of your scheduled arrival to the hospital.  Type 1 and type 2 diabetics should only drink water.  No gum chewing or hard candies.     __x__ 2. No Alcohol for 24 hours before or after surgery.   __x__3. No Smoking for 24 prior to surgery.   ____  4. Bring all medications with you on the day of surgery if instructed.    __x__ 5. Notify your doctor if there is any change in your medical condition     (cold, fever, infections).     Do not wear jewelry, make-up, hairpins, clips or nail polish.  Do not wear lotions, powders, or perfumes. You may wear deodorant.  Do not shave 48 hours prior to surgery. Men may shave face and neck.  Do not bring valuables to the hospital.    [facility] is not responsible for any belongings or valuables.               Contacts, dentures or bridgework may not be worn into surgery.  Leave your suitcase in the car. After surgery it may be brought to your room.  For patients admitted to the hospital, discharge time is determined by your treatment team.   Patients discharged the day of surgery will not be allowed to drive home.  You will need someone to drive you home and stay with you the night of your procedure.    Please read over the following fact sheets  that you were given:   [facility] Preparing for Surgery and or MRSA Information   _x___ TAKE THE FOLLOWING MEDICATION THE MORNING OF SURGERY WITH A SMALL SIP OF WATER. These include:  1. MAY TAKE OXYCODONE IF NEEDED  2.  3.  4.  5.  6.  ____Fleets enema or Magnesium Citrate as directed.   _x___ Use CHG Soap or sage wipes as directed on instruction sheet   ____ Use inhalers on the day of surgery and bring to hospital day of surgery  ____ Stop Metformin and Janumet 2 days prior to surgery.    _X___ Take 1/2 of usual insulin dose the night before surgery and none on the morning surgery-TAKE 5 UNITS OF INSULIN Sunday NIGHT AND NO INSULIN AM OF SURGERY  _x___ Follow recommendations from Cardiologist, Pulmonologist or PCP regarding  stopping Aspirin, Coumadin, Plavix ,Eliquis, Effient, or Pradaxa, and Pletal-PT NOT CURRENTLY TAKING HIS ASA  X____Stop Anti-inflammatories such as Advil, Aleve, Ibuprofen, Motrin, Naproxen, Naprosyn, Goodies powders or aspirin products NOW-OK to take Tylenol   ____ Stop supplements until after surgery.    ____ Bring C-Pap to the hospital.

## 2017-05-15 NOTE — Telephone Encounter (Signed)
Left VM for patient to return call about rescheduled chemo class. Mailed patient new appts also.

## 2017-05-17 MED ORDER — CEFAZOLIN SODIUM-DEXTROSE 2-4 GM/100ML-% IV SOLN: 2.0000 g | INTRAVENOUS | Status: DC

## 2017-05-18 ENCOUNTER — Ambulatory Visit: Payer: Medicaid Other

## 2017-05-18 ENCOUNTER — Encounter: Payer: Self-pay | Admitting: *Deleted

## 2017-05-18 ENCOUNTER — Encounter
Admission: RE | Disposition: A | Discharge status: Home / Self Care | Payer: Self-pay | Source: Ambulatory Visit | Attending: Surgery

## 2017-05-18 ENCOUNTER — Ambulatory Visit
Admission: RE | Admit: 2017-05-18 | Discharge: 2017-05-18 | Disposition: A | Discharge status: Home / Self Care | Payer: Medicaid Other | Source: Ambulatory Visit | Attending: Surgery | Admitting: Surgery

## 2017-05-18 ENCOUNTER — Telehealth: Payer: Self-pay | Admitting: *Deleted

## 2017-05-18 DIAGNOSIS — D62 Acute posthemorrhagic anemia: Secondary | ICD-10-CM | POA: Diagnosis not present

## 2017-05-18 DIAGNOSIS — F1010 Alcohol abuse, uncomplicated: Secondary | ICD-10-CM

## 2017-05-18 DIAGNOSIS — F17200 Nicotine dependence, unspecified, uncomplicated: Secondary | ICD-10-CM

## 2017-05-18 DIAGNOSIS — G8929 Other chronic pain: Secondary | ICD-10-CM | POA: Insufficient documentation

## 2017-05-18 DIAGNOSIS — K859 Acute pancreatitis without necrosis or infection, unspecified: Secondary | ICD-10-CM | POA: Insufficient documentation

## 2017-05-18 DIAGNOSIS — E440 Moderate protein-calorie malnutrition: Secondary | ICD-10-CM

## 2017-05-18 DIAGNOSIS — K219 Gastro-esophageal reflux disease without esophagitis: Secondary | ICD-10-CM | POA: Insufficient documentation

## 2017-05-18 DIAGNOSIS — K8689 Other specified diseases of pancreas: Secondary | ICD-10-CM

## 2017-05-18 DIAGNOSIS — C187 Malignant neoplasm of sigmoid colon: Secondary | ICD-10-CM | POA: Diagnosis not present

## 2017-05-18 DIAGNOSIS — E1165 Type 2 diabetes mellitus with hyperglycemia: Secondary | ICD-10-CM

## 2017-05-18 DIAGNOSIS — Z794 Long term (current) use of insulin: Secondary | ICD-10-CM

## 2017-05-18 DIAGNOSIS — E119 Type 2 diabetes mellitus without complications: Secondary | ICD-10-CM | POA: Insufficient documentation

## 2017-05-18 DIAGNOSIS — C259 Malignant neoplasm of pancreas, unspecified: Secondary | ICD-10-CM | POA: Insufficient documentation

## 2017-05-18 DIAGNOSIS — K529 Noninfective gastroenteritis and colitis, unspecified: Secondary | ICD-10-CM

## 2017-05-18 DIAGNOSIS — C20 Malignant neoplasm of rectum: Secondary | ICD-10-CM | POA: Diagnosis not present

## 2017-05-18 DIAGNOSIS — Z91199 Patient's noncompliance with other medical treatment and regimen due to unspecified reason: Secondary | ICD-10-CM

## 2017-05-18 DIAGNOSIS — Z789 Other specified health status: Secondary | ICD-10-CM

## 2017-05-18 DIAGNOSIS — R109 Unspecified abdominal pain: Secondary | ICD-10-CM

## 2017-05-18 DIAGNOSIS — Z9119 Patient's noncompliance with other medical treatment and regimen: Secondary | ICD-10-CM

## 2017-05-18 HISTORY — PX: PORTACATH PLACEMENT: SHX2246

## 2017-05-18 LAB — SURGICAL PCR SCREEN
MRSA, PCR: NEGATIVE
Staphylococcus aureus: NEGATIVE

## 2017-05-18 LAB — GLUCOSE, CAPILLARY
Glucose-Capillary: 385 mg/dL — ABNORMAL HIGH (ref 65–99)
Glucose-Capillary: 84 mg/dL (ref 65–99)

## 2017-05-18 SURGERY — INSERTION, TUNNELED CENTRAL VENOUS DEVICE, WITH PORT
Anesthesia: Choice | Site: Chest | Laterality: Right | Wound class: Clean

## 2017-05-18 MED ORDER — LIDOCAINE HCL (PF) 1 % IJ SOLN
INTRAMUSCULAR | Status: AC
  Filled 2017-05-18: qty 30

## 2017-05-18 MED ORDER — LIDOCAINE HCL 1 % IJ SOLN
INTRAMUSCULAR | Status: DC | PRN
  Administered 2017-05-18: 10 mL

## 2017-05-18 MED ORDER — BUPIVACAINE HCL (PF) 0.5 % IJ SOLN
INTRAMUSCULAR | Status: AC
  Filled 2017-05-18: qty 30

## 2017-05-18 MED ORDER — SODIUM CHLORIDE 0.9 % IJ SOLN
INTRAMUSCULAR | Status: AC
  Filled 2017-05-18: qty 50

## 2017-05-18 MED ORDER — SODIUM CHLORIDE 0.9 % IV SOLN
INTRAVENOUS | Status: DC
  Administered 2017-05-18: 50 mL/h via INTRAVENOUS

## 2017-05-18 MED ORDER — HEPARIN SOD (PORK) LOCK FLUSH 100 UNIT/ML IV SOLN
INTRAVENOUS | Status: DC | PRN
  Administered 2017-05-18: 300 [IU] via INTRAVENOUS

## 2017-05-18 MED ORDER — OXYCODONE HCL 10 MG PO TABS: 10.0000 mg | ORAL_TABLET | Freq: Four times a day (QID) | ORAL | 0 refills | Status: DC | PRN

## 2017-05-18 MED ORDER — INSULIN ASPART 100 UNIT/ML ~~LOC~~ SOLN
SUBCUTANEOUS | Status: AC
  Administered 2017-05-18: 9 [IU] via SUBCUTANEOUS
  Filled 2017-05-18: qty 1

## 2017-05-18 MED ORDER — FAMOTIDINE 20 MG PO TABS: 20.0000 mg | ORAL_TABLET | Freq: Once | ORAL | Status: DC

## 2017-05-18 MED ORDER — INSULIN ASPART 100 UNIT/ML ~~LOC~~ SOLN
9.0000 [IU] | Freq: Once | SUBCUTANEOUS | Status: AC
  Administered 2017-05-18: 9 [IU] via SUBCUTANEOUS

## 2017-05-18 MED ORDER — HEPARIN SOD (PORK) LOCK FLUSH 100 UNIT/ML IV SOLN
INTRAVENOUS | Status: AC
  Filled 2017-05-18: qty 5

## 2017-05-18 MED ORDER — CEFAZOLIN SODIUM-DEXTROSE 2-4 GM/100ML-% IV SOLN
INTRAVENOUS | Status: AC
  Filled 2017-05-18: qty 100

## 2017-05-18 MED ORDER — CHLORHEXIDINE GLUCONATE CLOTH 2 % EX PADS
6.0000 | MEDICATED_PAD | Freq: Once | CUTANEOUS | Status: AC
  Administered 2017-05-18: 6 via TOPICAL

## 2017-05-18 MED ORDER — CHLORHEXIDINE GLUCONATE CLOTH 2 % EX PADS: 6.0000 | MEDICATED_PAD | Freq: Once | CUTANEOUS | Status: DC

## 2017-05-18 SURGICAL SUPPLY — 28 items
ADH SKN CLS APL DERMABOND .7 (GAUZE/BANDAGES/DRESSINGS) ×2
BAG DECANTER FOR FLEXI CONT (MISCELLANEOUS) ×3 IMPLANT
BLADE SURG SZ11 CARB STEEL (BLADE) ×3 IMPLANT
CHLORAPREP W/TINT 26ML (MISCELLANEOUS) ×6 IMPLANT
COVER LIGHT HANDLE STERIS (MISCELLANEOUS) ×6 IMPLANT
DECANTER SPIKE VIAL GLASS SM (MISCELLANEOUS) ×6 IMPLANT
DERMABOND ADVANCED (GAUZE/BANDAGES/DRESSINGS) ×4
DERMABOND ADVANCED .7 DNX12 (GAUZE/BANDAGES/DRESSINGS) ×1 IMPLANT
DRAPE C-ARM 42X70 (DRAPES) ×3 IMPLANT
DRAPE LAPAROTOMY 77X122 PED (DRAPES) ×3 IMPLANT
ELECT REM PT RETURN 9FT ADLT (ELECTROSURGICAL) ×3
ELECTRODE REM PT RTRN 9FT ADLT (ELECTROSURGICAL) ×1 IMPLANT
GLOVE BIO SURGEON STRL SZ7 (GLOVE) ×3 IMPLANT
GLOVE BIOGEL PI IND STRL 7.5 (GLOVE) ×1 IMPLANT
GLOVE BIOGEL PI INDICATOR 7.5 (GLOVE) ×2
GOWN STRL REUS W/TWL LRG LVL3 (GOWN DISPOSABLE) ×6 IMPLANT
IV NS 500ML (IV SOLUTION) ×3
IV NS 500ML BAXH (IV SOLUTION) ×1 IMPLANT
KIT PORT POWER 8FR ISP CVUE (Miscellaneous) ×3 IMPLANT
KIT RM TURNOVER STRD PROC AR (KITS) ×3 IMPLANT
NDL HYPO 25X1 1.5 SAFETY (NEEDLE) ×1 IMPLANT
NEEDLE HYPO 25X1 1.5 SAFETY (NEEDLE) ×3 IMPLANT
PACK BASIN MINOR ARMC (MISCELLANEOUS) ×3 IMPLANT
SUT MNCRL AB 4-0 PS2 18 (SUTURE) ×3 IMPLANT
SUT VIC AB 3-0 SH 27 (SUTURE) ×3
SUT VIC AB 3-0 SH 27X BRD (SUTURE) ×1 IMPLANT
SYR 10ML LL (SYRINGE) ×3 IMPLANT
SYR 20CC LL (SYRINGE) ×3 IMPLANT

## 2017-05-18 NOTE — OR Nursing (Signed)
Portable CXR obtained and viewed by Dr. Rosana Hoes. He reports port placement looks good and patient is cleared for discharge. He is also aware of patient's BS drop to 84. Patient ate a package of nabs and drank four ounces of orange juice. Patient reports he will also get breakfast on his way home.

## 2017-05-18 NOTE — Op Note (Signed)
SURGICAL PROCEDURE REPORT  DATE OF PROCEDURE: 05/18/2017   ATTENDING SURGEON: Corene Cornea E. Rosana Hoes, MD   ANESTHESIA: Local anesthesia   PRE-OPERATIVE DIAGNOSIS: Advanced rectal adenocarcinomarequiring durable central venous access for chemotherapy (ICD-10's: C20)  POST-OPERATIVE DIAGNOSIS: Advanced rectal adenocarcinomarequiring durable central venous access for chemotherapy (ICD-10's: C20)  PROCEDURE(S): (cpt: 36561) 1.) Percutaneous access of Right internal jugular vein under ultrasound guidance  2.) Insertion of tunneled Right internal jugular Bard PowerPort central venous catheter with subcutaneous port  INTRAOPERATIVE FINDINGS: Patent easily compressible Right internal jugular vein with appropriate respiratory variations and well-secured tunneled central venous catheter with subcutaneous port at completion of the procedure  INTRAOPERATIVE FLUIDS: 200 mL crystalloid, 0 mL contrast used   FLUOROSCOPY: 1 second   ESTIMATED BLOOD LOSS: Minimal (<20 mL)   SPECIMENS: None   IMPLANTS: 31F tunneled Bard PowerPort central venous catheter with subcutaneous port  DRAINS: None   COMPLICATIONS: None apparent   CONDITION AT COMPLETION: Hemodynamically stable, awake   DISPOSITION: PACU   INDICATION(S) FOR PROCEDURE:  Patient is a 56 y.o. male who presented with advanced rectal cancer requiring durable central venous access for chemotherapy. All risks, benefits, and alternatives to above elective procedures were discussed with the patient, who elected to proceed, and informed consent was accordingly obtained at that time.  DETAILS OF PROCEDURE:  Patient was brought to the operative suite and appropriately identified. In Trendelenburg position, Right IJ venous access site was prepped and draped in the usual sterile fashion, and following a brief timeout, limited duplex evaluation of the Right internal jugular vein was performed. Percutaneous Right IJ venous access was obtained under  ultrasound guidance using Seldinger technique, by which local anesthetic was injected over the Right IJ vein, and access needle was inserted under direct ultrasound visualization into the Right IJ vein, through which soft guidewire was advanced, over which access needle was withdrawn. Guidewire was secured, attention was directed to injection of local anesthetic along the planned tunnel site, 2-3 cm transverse Right chest incision was made and confirmed to accommodate the subcutaneous port, and flushed catheter was tunneled retrograde from the port site over the Right chest to the Right IJ access site with the attached port well-secured to the catheter and within the subcutaneous pocket. Insertion sheath was advanced over the guidewire, which was withdrawn along with the insertion sheath dilator. Length of catheter needed to position the catheter tip at the atrio-caval junction was then measured under direct fluoroscopic visualization, after which the catheter was cut to the measured length and advanced through the sheath into the Right internal jugular vein and SVC without evidence of cardiac arrhythmias during the procedure. Port was confirmed to withdraw blood and flush easily, after which concentrated heparin was instilled into the port and catheter. Dermis at the subcutaneous pocket was re-approximated using buried interrupted 3-0 Vicryl suture, and 4-0 Vicryl suture was used to re-approximate skin at the insertion and subcutaneous port sites in running subcuticular fashion for the subcutaneous port and buried interrupted fashion for the insertion site. Skin was cleaned, dried, and sterile skin glue was applied. Patient was then safely transferred to PACU for a chest x-ray.  I was present for all aspects of the procedures, and there were no intraprocedural complications apparent.

## 2017-05-18 NOTE — Interval H&P Note (Signed)
History and Physical Interval Note:  05/18/2017 7:12 AM  [name]  has presented today for surgery, with the diagnosis of sigmoid colon adenocarcinoma  The various methods of treatment have been discussed with the patient and family. After consideration of risks, benefits and other options for treatment, the patient has consented to  Procedure(s): INSERTION PORT-A-CATH (N/A) as a surgical intervention .  The patient's history has been reviewed, patient examined, no change in status, stable for surgery.  I have reviewed the patient's chart and labs.  Questions were answered to the patient's satisfaction.     Vickie Epley

## 2017-05-18 NOTE — Telephone Encounter (Signed)
Patient called requesting refill of pain medicine, states he had "stent put in neck this morning"

## 2017-05-18 NOTE — Discharge Instructions (Addendum)
AMBULATORY SURGERY  DISCHARGE INSTRUCTIONS   1) The drugs that you were given will stay in your system until tomorrow so for the next 24 hours you should not:  A) Drive an automobile B) Make any legal decisions C) Drink any alcoholic beverage   2) You may resume regular meals tomorrow.  Today it is better to start with liquids and gradually work up to solid foods.  You may eat anything you prefer, but it is better to start with liquids, then soup and crackers, and gradually work up to solid foods.   3) Please notify your doctor immediately if you have any unusual bleeding, trouble breathing, redness and pain at the surgery site, drainage, fever, or pain not relieved by medication.    4) Additional Instructions:        Please contact your physician with any problems or Same Day Surgery at [phone], Monday through Friday 6 am to 4 pm, or [place] [facility] number at [phone].In addition to included general post-operative instructions for Port Insertion,  Diet: Resume home heart healthy diet.   Activity: No heavy lifting >20 pounds (children, pets, laundry, garbage) or strenuous activity until follow-up, but light activity and walking are encouraged. Do not drive or drink alcohol if taking narcotic pain medications.  Wound care: 2 days after surgery (Wednesday, 11/21), may shower/get incision wet with soapy water and pat dry (do not rub incisions), but no baths or submerging incision underwater until follow-up.   Medications: Resume all home medications. For mild to moderate pain: ibuprofen/naproxen (if no kidney disease).  Call office [phone]) at any time if any questions, worsening pain, fevers/chills, bleeding, drainage from incision site, or other concerns.

## 2017-05-19 ENCOUNTER — Encounter: Payer: Self-pay | Admitting: Surgery

## 2017-05-25 ENCOUNTER — Inpatient Hospital Stay: Payer: Medicaid Other

## 2017-05-25 ENCOUNTER — Ambulatory Visit: Payer: Medicaid Other | Admitting: Gastroenterology

## 2017-05-25 ENCOUNTER — Encounter: Payer: Self-pay | Admitting: Emergency Medicine

## 2017-05-25 ENCOUNTER — Encounter: Payer: Self-pay | Admitting: Oncology

## 2017-05-25 ENCOUNTER — Inpatient Hospital Stay (HOSPITAL_BASED_OUTPATIENT_CLINIC_OR_DEPARTMENT_OTHER): Payer: Medicaid Other | Admitting: Oncology

## 2017-05-25 ENCOUNTER — Emergency Department
Admission: EM | Admit: 2017-05-25 | Discharge: 2017-05-25 | Disposition: A | Discharge status: Home / Self Care | Payer: Medicaid Other | Attending: Emergency Medicine | Admitting: Emergency Medicine

## 2017-05-25 ENCOUNTER — Other Ambulatory Visit: Payer: Self-pay

## 2017-05-25 ENCOUNTER — Emergency Department: Payer: Medicaid Other

## 2017-05-25 VITALS — BP 81/51 | HR 95 | Temp 96.3°F | Wt 109.4 lb

## 2017-05-25 DIAGNOSIS — Z91199 Patient's noncompliance with other medical treatment and regimen due to unspecified reason: Secondary | ICD-10-CM

## 2017-05-25 DIAGNOSIS — Z9119 Patient's noncompliance with other medical treatment and regimen: Secondary | ICD-10-CM

## 2017-05-25 DIAGNOSIS — Z7982 Long term (current) use of aspirin: Secondary | ICD-10-CM | POA: Insufficient documentation

## 2017-05-25 DIAGNOSIS — R970 Elevated carcinoembryonic antigen [CEA]: Secondary | ICD-10-CM | POA: Diagnosis not present

## 2017-05-25 DIAGNOSIS — E1165 Type 2 diabetes mellitus with hyperglycemia: Secondary | ICD-10-CM | POA: Insufficient documentation

## 2017-05-25 DIAGNOSIS — G8929 Other chronic pain: Secondary | ICD-10-CM | POA: Diagnosis not present

## 2017-05-25 DIAGNOSIS — K8689 Other specified diseases of pancreas: Secondary | ICD-10-CM

## 2017-05-25 DIAGNOSIS — C187 Malignant neoplasm of sigmoid colon: Secondary | ICD-10-CM | POA: Diagnosis not present

## 2017-05-25 DIAGNOSIS — Z79899 Other long term (current) drug therapy: Secondary | ICD-10-CM

## 2017-05-25 DIAGNOSIS — Z794 Long term (current) use of insulin: Secondary | ICD-10-CM | POA: Insufficient documentation

## 2017-05-25 DIAGNOSIS — Z9114 Patient's other noncompliance with medication regimen: Secondary | ICD-10-CM

## 2017-05-25 DIAGNOSIS — R739 Hyperglycemia, unspecified: Secondary | ICD-10-CM

## 2017-05-25 DIAGNOSIS — N390 Urinary tract infection, site not specified: Secondary | ICD-10-CM | POA: Insufficient documentation

## 2017-05-25 DIAGNOSIS — E440 Moderate protein-calorie malnutrition: Secondary | ICD-10-CM

## 2017-05-25 DIAGNOSIS — R634 Abnormal weight loss: Secondary | ICD-10-CM

## 2017-05-25 DIAGNOSIS — F17210 Nicotine dependence, cigarettes, uncomplicated: Secondary | ICD-10-CM | POA: Insufficient documentation

## 2017-05-25 DIAGNOSIS — R109 Unspecified abdominal pain: Secondary | ICD-10-CM

## 2017-05-25 DIAGNOSIS — R197 Diarrhea, unspecified: Secondary | ICD-10-CM

## 2017-05-25 DIAGNOSIS — F1010 Alcohol abuse, uncomplicated: Secondary | ICD-10-CM

## 2017-05-25 DIAGNOSIS — D125 Benign neoplasm of sigmoid colon: Secondary | ICD-10-CM

## 2017-05-25 LAB — COMPREHENSIVE METABOLIC PANEL
ALBUMIN: 3.4 g/dL — AB (ref 3.5–5.0)
ALK PHOS: 170 U/L — AB (ref 38–126)
ALT: 58 U/L (ref 17–63)
AST: 91 U/L — ABNORMAL HIGH (ref 15–41)
Anion gap: 9 (ref 5–15)
BUN: <5 mg/dL — ABNORMAL LOW (ref 6–20)
CALCIUM: 9.1 mg/dL (ref 8.9–10.3)
CHLORIDE: 93 mmol/L — AB (ref 101–111)
CO2: 29 mmol/L (ref 22–32)
CREATININE: 0.76 mg/dL (ref 0.61–1.24)
GFR calc Af Amer: >60 mL/min (ref >60)
GFR calc non Af Amer: >60 mL/min (ref >60)
GLUCOSE: 669 mg/dL — AB (ref 65–99)
Potassium: 3.2 mmol/L — ABNORMAL LOW (ref 3.5–5.1)
SODIUM: 131 mmol/L — AB (ref 135–145)
Total Bilirubin: 0.5 mg/dL (ref 0.3–1.2)
Total Protein: 7.2 g/dL (ref 6.5–8.1)

## 2017-05-25 LAB — BASIC METABOLIC PANEL
Anion gap: 12 (ref 5–15)
CALCIUM: 9.2 mg/dL (ref 8.9–10.3)
CHLORIDE: 93 mmol/L — AB (ref 101–111)
CO2: 27 mmol/L (ref 22–32)
CREATININE: 0.83 mg/dL (ref 0.61–1.24)
GFR calc Af Amer: >60 mL/min (ref >60)
GFR calc non Af Amer: >60 mL/min (ref >60)
Glucose, Bld: 695 mg/dL (ref 65–99)
Potassium: 3.1 mmol/L — ABNORMAL LOW (ref 3.5–5.1)
SODIUM: 132 mmol/L — AB (ref 135–145)

## 2017-05-25 LAB — URINALYSIS, COMPLETE (UACMP) WITH MICROSCOPIC
Bacteria, UA: NONE SEEN
Bilirubin Urine: NEGATIVE
Hgb urine dipstick: NEGATIVE
Ketones, ur: NEGATIVE mg/dL
LEUKOCYTES UA: NEGATIVE
Nitrite: NEGATIVE
PH: 6 (ref 5.0–8.0)
Protein, ur: NEGATIVE mg/dL
Specific Gravity, Urine: 1.028 (ref 1.005–1.030)

## 2017-05-25 LAB — CBC WITH DIFFERENTIAL/PLATELET
Basophils Absolute: 0 10*3/uL (ref 0–0.1)
Basophils Relative: 0 %
EOS ABS: 0.1 10*3/uL (ref 0–0.7)
Eosinophils Relative: 1 %
HCT: 29.7 % — ABNORMAL LOW (ref 40.0–52.0)
HEMOGLOBIN: 9.9 g/dL — AB (ref 13.0–18.0)
Lymphocytes Relative: 14 %
Lymphs Abs: 1.5 10*3/uL (ref 1.0–3.6)
MCH: 31.3 pg (ref 26.0–34.0)
MCHC: 33.4 g/dL (ref 32.0–36.0)
MCV: 93.7 fL (ref 80.0–100.0)
MONO ABS: 0.6 10*3/uL (ref 0.2–1.0)
MONOS PCT: 5 %
NEUTROS ABS: 8.7 10*3/uL — AB (ref 1.4–6.5)
Neutrophils Relative %: 80 %
Platelets: 205 10*3/uL (ref 150–440)
RBC: 3.17 MIL/uL — ABNORMAL LOW (ref 4.40–5.90)
RDW: 14.7 % — AB (ref 11.5–14.5)
WBC: 10.9 10*3/uL — ABNORMAL HIGH (ref 3.8–10.6)

## 2017-05-25 LAB — CBC
HCT: 31.3 % — ABNORMAL LOW (ref 40.0–52.0)
Hemoglobin: 10.5 g/dL — ABNORMAL LOW (ref 13.0–18.0)
MCH: 31.5 pg (ref 26.0–34.0)
MCHC: 33.5 g/dL (ref 32.0–36.0)
MCV: 94.2 fL (ref 80.0–100.0)
PLATELETS: 193 10*3/uL (ref 150–440)
RBC: 3.32 MIL/uL — ABNORMAL LOW (ref 4.40–5.90)
RDW: 15 % — AB (ref 11.5–14.5)
WBC: 12.3 10*3/uL — AB (ref 3.8–10.6)

## 2017-05-25 LAB — BLOOD GAS, VENOUS
Acid-base deficit: 1.8 mmol/L (ref 0.0–2.0)
BICARBONATE: 23.7 mmol/L (ref 20.0–28.0)
FIO2: 0.21
O2 Saturation: 90.1 %
PH VEN: 7.35 (ref 7.250–7.430)
PO2 VEN: 62 mmHg — AB (ref 32.0–45.0)
Patient temperature: 37
pCO2, Ven: 43 mmHg — ABNORMAL LOW (ref 44.0–60.0)

## 2017-05-25 LAB — ETHANOL: Alcohol, Ethyl (B): <10 mg/dL (ref <10)

## 2017-05-25 LAB — TROPONIN I: Troponin I: <0.03 ng/mL (ref <0.03)

## 2017-05-25 LAB — GLUCOSE, CAPILLARY
Glucose-Capillary: >600 mg/dL (ref 65–99)
Glucose-Capillary: 480 mg/dL — ABNORMAL HIGH (ref 65–99)
Glucose-Capillary: 395 mg/dL — ABNORMAL HIGH (ref 65–99)

## 2017-05-25 MED ORDER — CEPHALEXIN 500 MG PO CAPS
500.0000 mg | ORAL_CAPSULE | Freq: Once | ORAL | Status: AC
  Administered 2017-05-25: 500 mg via ORAL
  Filled 2017-05-25: qty 1

## 2017-05-25 MED ORDER — SODIUM CHLORIDE 0.9 % IV BOLUS (SEPSIS)
1000.0000 mL | Freq: Once | INTRAVENOUS | Status: AC
  Administered 2017-05-25: 1000 mL via INTRAVENOUS

## 2017-05-25 MED ORDER — INSULIN ASPART 100 UNIT/ML ~~LOC~~ SOLN
10.0000 [IU] | Freq: Once | SUBCUTANEOUS | Status: AC
  Administered 2017-05-25: 10 [IU] via INTRAVENOUS
  Filled 2017-05-25: qty 1

## 2017-05-25 MED ORDER — CEPHALEXIN 500 MG PO CAPS: 500.0000 mg | ORAL_CAPSULE | Freq: Two times a day (BID) | ORAL | 0 refills | Status: DC

## 2017-05-25 NOTE — ED Notes (Signed)
First nurse note  Sent in by Cancer center with elevated glucose and low b/p

## 2017-05-25 NOTE — ED Notes (Signed)
Patient was found dressed and walking in the hallway, stating that he was leaving to get a cigarette. Patient was informed that his IVs would need to be removed, medication given and discharge performed. Patient was compliant with coming back into the room.

## 2017-05-25 NOTE — ED Notes (Signed)
Dr. Cherylann Banas aware of blood glucose of 695.

## 2017-05-25 NOTE — ED Notes (Signed)
Patient given ED sandwich tray and a diet Sprite per Dr. Cherylann Banas. Urinal at bedside.

## 2017-05-25 NOTE — Progress Notes (Signed)
Nutrition Follow-up:  Patient with colon cancer, followed by Dr. Tasia Catchings. Patient had lower anterior resection with primary reanastomosis on 10/19 at [facility].    Met with patient and significant other in clinic today.  Patient reports that he eats "all the time" but "everything runs through me."  Significant other reports patient is not taking creon like he is suppose too.  Reports that when he was taking it that he did not have as much diarrhea.  Asked if he has a follow up with GI and patient does not know.    Patient reports he checks blood glucose 3 times per day and sugars have been in the 200s.  Significant other reports that he is not checking sugars.    Probed multiple times to try and find out what exactly patient is eating in a typical day but unable to get information.    Patient does not like ensure/boost drinks.  Feels that he is somewhat lactose intolerant but able to tolerate cheese.    Medications: reviewed  Labs: reviewed, no glucose back yet today  Anthropometrics:   Weight today decreased to 109 lb from 114 lb on 01/29/2017 (last RD visit)   NUTRITION DIAGNOSIS: Inadequate food and beverage intake continues   MALNUTRITION DIAGNOSIS: Severe malnutrition continues   INTERVENTION:   Reviewed importance of good nutrition and controlling diarrhea.   Encouraged patient to take creon as prescribed and if not working to follow-up with GI doctor to titrate medication.   Encouraged small frequent meals. Encouraged good sources of protein and low fiber foods for at least 6-8 weeks post op.  "Gastric Surgery" nutrition therapy handout given to patient from [facility].  Encouraged less fried foods to help with diarrhea Encouraged patient to check blood glucose regularly and notify MD with changes in blood glucose     MONITORING, EVALUATION, GOAL: Patient will consume adequate calories and protein to meet nutritional needs and prevent further weight loss   NEXT VISIT: Dec 3rd after MD  visit  John Vasconcelos B. Zenia Resides, [place], [place] Registered Dietitian [phone] (pager)

## 2017-05-25 NOTE — ED Notes (Signed)
Patient declined discharge vital signs. Patient also declined wheelchair for discharge. Family member was with patient to walk out and another family member brought the car to ED entrance.

## 2017-05-25 NOTE — Progress Notes (Signed)
Patient here today for follow up.   

## 2017-05-25 NOTE — ED Triage Notes (Signed)
Pt comes into the ED via POV c/o hyperglycemia.  Patient's cancer Dr. Hulen Skains him and informed him that his sugar was over 600.  Patient is diabetic and has not started his cancer treatments at this time.  Patient denies any ETOH use this morning but has a significant h/o it per the cancer Doctor who called.  Patient presents hypotensive as well at this time.  Patient has even and unlabored respirations.  Patient denies any pain but states he feels weak.

## 2017-05-25 NOTE — Progress Notes (Signed)
[facility] Initial Visit:  Patient Care Team: [name], MD as PCP - General (Internal Medicine) Clent Jacks, RN as Registered Nurse  CHIEF COMPLAINTS/PURPOSE OF CONSULTATION: Follow up on treatment of colon cancer  HISTORY OF PRESENTING ILLNESS: [name] 56 y.o. male with PMH listed below is referred by Dr.Davis to me for evaluation of recently diagnosed colorectal cancer. Patient reports that he has been having significant weight loss, abdominal pain, persistent diarrhea for past few months. And more recently he lost 15 pounds in the past 6 weeks.  He had GI work up including EGD and colonoscopy in June 2018 which revealed non obstructing large mass in sigmoid colon, several colon polyps, one rectal polyp and a large sessile mass in cardia.  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 Rectal polyp showed small fragments of adenocarcinoma.  11/30/2016 CT abdomen and pelvis with contrast showed no evidence of metastatic disease.  11/30/2016 CT head showed no acute intracranial abnormality Due to non compliancy, he has not done any advised follow up or work up till now. He was seen by Dr.Davis on 01/15/2017.  Today he reports abdominal pain which he rates 9 out of 10. He requests to get pain medication. He used to follow up with a pain clinic and due to noncompliance with appointment, he was discharged from there and he reports not having any pain medication and has tried tylenol with no relief of his pain.  Patient otherwise denies any fever/chills, CP, or any worsening of his chronic SOB. He denies drinking alcohol, however per previous note, his family members reported that he continues to drink 5-6 beers a day and more excessively on weekends. Today his girlfriend drop him to cancer  center and left. Patient has not get the [place] lower extremity doppler done which was ordered by Dr.Davis yesterday due to the concern of lower extremity tenderness and swelling.  Patient has significant family history including father died at age of 62 with colon cancer and his brother died at age of age of 70 due to colon cancer. Per patient, they did not get genetic testing.  Patient is single, lives with his mother who is 28 years old. He has total 4 children, youngest kid is 92 years old who does not live with him.   # he has now completed his workup with flexible sigmoidoscopy demonstrating no additional rectal mass for biopsy (initial rectal polyp biopsy showed microscopic adenocarcinoma concerning for contamination from sigmoid adenocarcinoma biopsy). He is referred to [facility] surgery and has been schedule for surgery on 03/10/2017.   04/17/2017 s/p low anterior resection with primary reanastomosis for rectosigmoid adenocarcinoma. Intraoperatively a moderate sized mass was found about 12 cm from the anal verge and there was no evidence of carcinomatosis or liver metastasis. Patient was released and discharged on 04/21/2017.  Interval history Patient presented for follow-up of her colon cancer and discussion for adjuvant chemotherapy treatment. Patient is status post resection on 04/17/2017. Pathology reviewed stage III colon cancer. He continues to have chronic diarrhea. He has poorly controlled diabetes and his glucose was over 800s during last clinic visit. He was called and told to go to ER but he did not. He has not seen her primary recently. He says he is taking insulin shots including novolog but he does not remember when  was his last insulin shots. He has lost 9 pounds since last visit 2 weeks ago. He reports eating well.  He continues to drink 1-2 beers a day.  Denies any bone discharges, fever or chills. Did not go to chemotherapy class due to conflicts and is scheduled tomorrow.  He has had medi  port placed during interval by Dr.Davis.  He says he is taking creons but partner says he is not. Also she reports patient has been forgetful lately.    Review of Systems  Constitutional: Positive for fatigue and unexpected weight change. Negative for appetite change, chills and fever.  HENT:   Negative for hearing loss and lump/mass.   Eyes: Negative for eye problems.  Respiratory: Negative for chest tightness and cough.   Cardiovascular: Negative for chest pain.  Gastrointestinal: Positive for abdominal pain and diarrhea. Negative for abdominal distention and blood in stool.  Endocrine: Negative for hot flashes.  Genitourinary: Negative for difficulty urinating.   Musculoskeletal: Negative for arthralgias.  Skin: Negative for itching.  Neurological: Negative for dizziness.  Hematological: Negative for adenopathy.  Psychiatric/Behavioral: The patient is not nervous/anxious.     MEDICAL HISTORY: Past Medical History:  Diagnosis Date  . Anemia   . BPH without obstruction/lower urinary tract symptoms   . Cancer ([place])   . Cellulitis of scrotum   . Cyst of prostate 05/13/2015  . Diabetes ([place])   . Difficulty urinating   . Dyspnea   . Epididymoorchitis   . ETOH abuse   . Fracture of right tibia and fibula   . GERD (gastroesophageal reflux disease)   . GI bleed   . Malignant neoplasm of sigmoid colon ([place])   . Pancreatitis   . Trichomoniasis   . Wears dentures    Partial upper and lower.  reports poor fit.    SURGICAL HISTORY: Past Surgical History:  Procedure Laterality Date  . COLONOSCOPY WITH PROPOFOL N/A 12/02/2016   Procedure: COLONOSCOPY WITH PROPOFOL;  Surgeon: Lucilla Lame, MD;  Location: [facility] ENDOSCOPY;  Service: Endoscopy;  Laterality: N/A;  . ESOPHAGOGASTRODUODENOSCOPY (EGD) WITH PROPOFOL N/A 12/02/2016   Procedure: ESOPHAGOGASTRODUODENOSCOPY (EGD) WITH PROPOFOL;  Surgeon: Lucilla Lame, MD;  Location: [facility] ENDOSCOPY;  Service: Endoscopy;  Laterality: N/A;  .  FLEXIBLE SIGMOIDOSCOPY N/A 02/05/2017   Procedure: FLEXIBLE SIGMOIDOSCOPY;  Surgeon: Lucilla Lame, MD;  Location: [place];  Service: Gastroenterology;  Laterality: N/A;  Needs labs drawn. Needs to come in early.  No anesthesia  . FRACTURE SURGERY     TIBIA AND FIBULA  . FRACTURE SURGERY    . LAPAROSCOPIC PARTIAL COLECTOMY  04/17/2017   [facility]  . PORTACATH PLACEMENT Right 05/18/2017   Procedure: INSERTION PORT-A-CATH;  Surgeon: Vickie Epley, MD;  Location: [facility] ORS;  Service: Vascular;  Laterality: Right;  . resection of pancreas    . SCROTAL EXPLORATION      SOCIAL HISTORY: Social History   Socioeconomic History  . Marital status: Single    Spouse name: Not on file  . Number of children: Not on file  . Years of education: Not on file  . Highest education level: Not on file  Social Needs  . Financial resource strain: Not on file  . Food insecurity - worry: Not on file  . Food insecurity - inability: Not on file  . Transportation needs - medical: Not on file  . Transportation needs - non-medical: Not on file  Occupational History  . Not on file  Tobacco Use  . Smoking status: Current Every  Day Smoker    Packs/day: 0.25    Years: 41.00    Pack years: 10.25    Types: Cigarettes  . Smokeless tobacco: Never Used  . Tobacco comment: since age 24  Substance and Sexual Activity  . Alcohol use: Yes    Alcohol/week: 0.0 oz    Comment: occasionally - but says not drinkning currently  . Drug use: No  . Sexual activity: Not on file  Other Topics Concern  . Not on file  Social History Narrative  . Not on file    FAMILY HISTORY Family History  Problem Relation Age of Onset  . Cancer - Colon Father   . Colon cancer Brother     ALLERGIES:  is allergic to aspirin.  MEDICATIONS:  Current Outpatient Medications  Medication Sig Dispense Refill  . amitriptyline (ELAVIL) 25 MG tablet Take 25 mg by mouth.    Marland Kitchen aspirin EC 81 MG tablet Take 81 mg by mouth.    . B-D  UF III MINI PEN NEEDLES 31G X 5 MM MISC   1  . Blood Glucose Monitoring Suppl (GLUCOCOM BLOOD GLUCOSE MONITOR) DEVI use as directed    . cyclobenzaprine (FLEXERIL) 10 MG tablet Take 10 mg by mouth 2 (two) times daily.    . diphenoxylate-atropine (LOMOTIL) 2.5-0.025 MG tablet take 1 tablet four times a day if needed    . fluticasone (FLONASE) 50 MCG/ACT nasal spray 2 sprays by Each Nare route daily.    Marland Kitchen LANTUS SOLOSTAR 100 UNIT/ML Solostar Pen Inject 10 Units into the skin 2 (two) times daily. (Patient taking differently: Inject 10 Units into the skin daily at 10 pm. ) 15 mL 1  . lipase/protease/amylase (CREON) 36000 UNITS CPEP capsule Take 1 capsule (36,000 Units total) 3 (three) times daily before meals by mouth. 180 capsule 3  . NOVOLOG FLEXPEN 100 UNIT/ML FlexPen Inject 48 Units into the skin 3 (three) times daily with meals.   1  . Oxycodone HCl 10 MG TABS Take 1 tablet (10 mg total) every 6 (six) hours as needed by mouth. 30 tablet 0  . RA ACETAMINOPHEN 325 MG tablet take 2 tablets by mouth every 6 hours  0  . traZODone (DESYREL) 50 MG tablet take 1 tablet by mouth at bedtime TO HELP WITH SLEEP-PRN    . Vitamins/Minerals TABS Take 1 tablet every morning by mouth.      No current facility-administered medications for this visit.     PHYSICAL EXAMINATION:  ECOG PERFORMANCE STATUS: 1 - Symptomatic but completely ambulatory  Vitals:   05/25/17 0933  BP: (!) 81/51  Pulse: 95  Temp: (!) 96.3 F (35.7 C)    Filed Weights   05/25/17 0933  Weight: 109 lb 7 oz (49.6 kg)     Physical Exam GENERAL: No distress, cachectica.   SKIN:  No rashes or significant lesions  HEAD: Normocephalic, No masses, lesions, tenderness or abnormalities  EYES: Conjunctiva are pink, non icteric ENT: External ears normal ,lips , buccal mucosa, and tongue normal and mucous membranes are moist  LYMPH: No palpable cervical and axillary lymphadenopathy  LUNGS: Clear to auscultation, no crackles or  wheezes HEART: Regular rate & rhythm, no murmurs, no gallops, S1 normal and S2 normal  ABDOMEN: Abdomen soft, mild epigastric tenderness, normal bowel sounds, I did not appreciate any  masses or organomegaly.  MUSCULOSKELETAL: No CVA tenderness and no tenderness on percussion of the back or rib cage.  EXTREMITIES: No edema, no skin discoloration or tenderness NEURO:  Alert & oriented,   LABORATORY DATA: I have personally reviewed the data as listed:  Admission on 05/18/2017, Discharged on 05/18/2017  Component Date Value Ref Range Status  . MRSA, PCR 05/18/2017 NEGATIVE  NEGATIVE Final  . Staphylococcus aureus 05/18/2017 NEGATIVE  NEGATIVE Final   Comment: (NOTE) The Xpert SA Assay (FDA approved for NASAL specimens in patients 45 years of age and older), is one component of a comprehensive surveillance program. It is not intended to diagnose infection nor to guide or monitor treatment.   . Glucose-Capillary 05/18/2017 385* 65 - 99 mg/dL Final  . Glucose-Capillary 05/18/2017 84  65 - 99 mg/dL Final  Office Visit on 05/05/2017  Component Date Value Ref Range Status  . CEA 05/05/2017 16.9* 0.0 - 4.7 ng/mL Final   Comment: (NOTE)       Roche ECLIA methodology       Nonsmokers  <3.9                                     Smokers     <5.6 Performed At: [facility] [place], [place] [id number] Rush Farmer MD [id number]   . Sodium 05/05/2017 125* 135 - 145 mmol/L Final  . Potassium 05/05/2017 3.4* 3.5 - 5.1 mmol/L Final  . Chloride 05/05/2017 92* 101 - 111 mmol/L Final  . CO2 05/05/2017 23  22 - 32 mmol/L Final  . Glucose, Bld 05/05/2017 836* 65 - 99 mg/dL Final   Comment: RESULTS VERIFIED BY REPEAT TESTING CRITICAL RESULT CALLED TO, READ BACK BY AND VERIFIED WITH JENNY BURNS AT 13:10 05/05/2017 KMR   . BUN 05/05/2017 9  6 - 20 mg/dL Final  . Creatinine, Ser 05/05/2017 0.84  0.61 - 1.24 mg/dL Final  . Calcium 05/05/2017 9.1  8.9 - 10.3 mg/dL Final  . Total  Protein 05/05/2017 7.2  6.5 - 8.1 g/dL Final  . Albumin 05/05/2017 3.4* 3.5 - 5.0 g/dL Final  . AST 05/05/2017 35  15 - 41 U/L Final  . ALT 05/05/2017 36  17 - 63 U/L Final  . Alkaline Phosphatase 05/05/2017 143* 38 - 126 U/L Final  . Total Bilirubin 05/05/2017 0.5  0.3 - 1.2 mg/dL Final  . GFR calc non Af Amer 05/05/2017 >60  >60 mL/min Final  . GFR calc Af Amer 05/05/2017 >60  >60 mL/min Final   Comment: (NOTE) The eGFR has been calculated using the CKD EPI equation. This calculation has not been validated in all clinical situations. eGFR's persistently <60 mL/min signify possible Chronic Kidney Disease.   . Anion gap 05/05/2017 10  5 - 15 Final  . WBC 05/05/2017 10.7* 3.8 - 10.6 K/uL Final  . RBC 05/05/2017 3.32* 4.40 - 5.90 MIL/uL Final  . Hemoglobin 05/05/2017 10.4* 13.0 - 18.0 g/dL Final  . HCT 05/05/2017 31.6* 40.0 - 52.0 % Final  . MCV 05/05/2017 95.1  80.0 - 100.0 fL Final  . MCH 05/05/2017 31.3  26.0 - 34.0 pg Final  . MCHC 05/05/2017 32.9  32.0 - 36.0 g/dL Final  . RDW 05/05/2017 16.3* 11.5 - 14.5 % Final  . Platelets 05/05/2017 300  150 - 440 K/uL Final  . Neutrophils Relative % 05/05/2017 83  % Final  . Neutro Abs 05/05/2017 8.9* 1.4 - 6.5 K/uL Final  . Lymphocytes Relative 05/05/2017 9  % Final  . Lymphs Abs 05/05/2017 1.0  1.0 - 3.6 K/uL Final  .  Monocytes Relative 05/05/2017 6  % Final  . Monocytes Absolute 05/05/2017 0.6  0.2 - 1.0 K/uL Final  . Eosinophils Relative 05/05/2017 1  % Final  . Eosinophils Absolute 05/05/2017 0.1  0 - 0.7 K/uL Final  . Basophils Relative 05/05/2017 1  % Final  . Basophils Absolute 05/05/2017 0.1  0 - 0.1 K/uL Final    RADIOGRAPHIC STUDIES: I have personally reviewed the radiological images as listed and agree with the findings in the report 01/15/2017 [place] bilateral lower extremity doppler: No lower extremity DVT in either side.  11/30/2016: CT abdomen pelvis w contrast: Stable chronic changes as described above.Small gastrohepatic  ligament lymph nodes slightly more prominent than that seen on the prior exam likely of a reactive nature. 11/30/2016: CT head wo contrast: Chronic stable small vessel ischemic disease of periventricular white matter. No acute intracranial abnormality. 01/23/2017 CT chest abdomen pelvis w contrast 1. The sigmoid colon mass is difficult to perceive. No definite adenopathy in this vicinity. No liver metastatic disease is identified. 2. Chronically stable right hepatic lobe hemangioma. 3. I am suspicious of hepatic steatosis. 4. Mildly enlarged right gastric lymph node at 1.1 cm, previously 1.2 cm, significance uncertain. 5. Other imaging findings of potential clinical significance: 7 mm hypodense lesion in the right kidney upper pole is technically nonspecific although statistically likely to be a cyst. Descending colon diverticulosis. Aortic Atherosclerosis (ICD10-I70.0). Chronic crescentic hypodensity in the right prostate gland, possibly a cyst or some type of urethral diverticulum. Lower lumbar degenerative disc disease.  [facility] pathology A: Colon, sigmoid and rectum, low anterior resection  - Invasive moderately differentiated adenocarcinoma of the proximal rectum, 4.5 cm, with transmural invasion focally into perirectal soft tissue (see comment and synoptic report) - Angiolymphatic invasion present - No perineural invasion identified - Metastatic carcinoma involves one of thirty-two regional lymph nodes (1/32) - Surgical margins uninvolved - Separate tubular adenoma, 0.8 cm TUMOR  Tumor Site:Rectum   Tumor Location:Straddles the anterior peritoneal reflection   Histologic Type:Adenocarcinoma   Histologic Grade:G2: Moderately differentiated   Tumor Size:Greatest dimension in Centimeters (cm): 4.5 Centimeters (cm)  Additional Dimension in Centimeters (cm):1.5 Centimeters (cm)  Additional Dimension in Centimeters (cm):0.8 Centimeters (cm)  Tumor  Deposits:Not identified   Tumor Extent:  Tumor Extension:No evidence of primary tumor   Macroscopic Tumor Perforation:Not identified   Accessory Findings:  Lymphovascular Invasion:Present   :Small vessel lymphovascular invasion   Perineural Invasion:Not identified   Tumor Budding:Number of tumor buds in one 'hotspot' field (total number in area = 0.785 mm2): 5   :Intermediate score (5-9)   Type of Polyp in Which Invasive Carcinoma Arose:None identified   Treatment Effect:No known presurgical therapy   MARGINS  Margins:All margins are uninvolved by invasive carcinoma, high-grade dysplasia, intramucosal adenocarcinoma, and adenoma   Margins Examined:Proximal   Margins Examined:Distal   Margins Examined:Radial or Mesenteric   Distance of Invasive Carcinoma from Closest Margin:1.0 Centimeters (cm)  Closest Margin:Radial or Mesenteric   Distance of Tumor from Radial Margin:1.0 Centimeters (cm)  Distance of Tumor from Distal Margin:3.5 Centimeters (cm)  LYMPH NODES  Number of Lymph Nodes Involved:1   Number of Lymph Nodes Examined:32   PATHOLOGIC STAGE CLASSIFICATION (pTNM, AJCC 8th Edition)  TNM Descriptors:Not applicable   Primary Tumor (pT):pT3   Regional Lymph Nodes (pN):pN1    Mismatch Repair:  Immunohistochemistry (IHC) Testing for Mismatch Repair (MMR) Proteins:MLH1  MLH1 Result:Intact nuclear expression  Immunohistochemistry (IHC) Testing for Mismatch Repair (MMR) Proteins:MSH2  MSH2 Result:Intact nuclear expression  Immunohistochemistry (IHC)  Testing for Mismatch Repair (MMR) Proteins:MSH6  MSH6 Result:Intact nuclear expression  Immunohistochemistry (IHC) Testing for Mismatch Repair (MMR) Proteins:PMS2  PMS2  Result:Intact nuclear expression  Immunohistochemistry (IHC) Testing for Mismatch Repair (MMR) Proteins:Background nonneoplastic tissue / internal control with intact nuclear expression  IHC Interpretation:No loss of nuclear expression of MMR proteins: low probability of MSI-H      ASSESSMENT/PLAN Cancer Staging No matching staging information was found for the patient.   1. Moderate protein-calorie malnutrition ([place])   2. Malignant neoplasm of sigmoid colon ([place])   3. Noncompliance   4. Pancreatic insufficiency   5. Chronic abdominal pain   6. Type 2 diabetes mellitus with hyperglycemia, with long-term current use of insulin ([facility])   7. Weight loss   8. Non compliance w medication regimen   .  Plan:  # pT3N1 colorectal cancer.  Pathology results were discussed with patient. I discussed with him about adjuvant chemotherapy with 5-FU-containing regimen. CEA remains elevated. ? Non specific due to chronic diarrhea or mets? Consider CT scan.   # Patient has chronic epigastric pain due to chronic pancreatitis, continue his pain medication, 03/19/2017 refilled oxycodone 43m Q6 hour PRN, dispense 60 pill. He did not ask for more Rx today.   # He has ongoing diarrhea from pancreatic dysfunction as well as colon cancer. Continue Creon to 36,000 units TID, lomotil PRN. Referred to GI for further evaluation titration of Creon.   # Chemotherapy class for FOLFOX, he missed his appointment and is rescheduled.  # will meet nutritionist today.  # I discussed with patient that I highly recommend him to be complaint with his medical appointment with primary care physician Dr.Burns. I have called and talked to Dr.Burns regarding his case and I urge patient to call pcp's office and get an appointment to tighten his glucose control. Also he was referred to GI for chronic diarrhea and pancreatic insufficieny. I discussed with him that if he continue to be not compliant with glucose  control, and medication, I may not be able to offer him chemotherapy as chemotherapy may harm him than benefit him if he is too weak. Will check his lab today.   # He has a strong history of family history of colon cancer, needs genetic counseling. Due to his noncompliance, most time of his clinic visits have been concentrating on advising him to be mor compliant and [facility] and primary care physician. I have not get chance to discuss with him again recently. Will revisit in the future.   Today he is again hyperglycemic, and low BP, will send him to ER for further evaluation. He agrees.   All questions were answered. The patient knows to call the clinic with any problems, questions or concerns. Return visit in 1 week.     Dr. ZEarlie Server MD, PhD CThe Outpatient Center Of Boynton Beachat AAbrom Kaplan Memorial HospitalPager- [phone]/26/2018

## 2017-05-25 NOTE — ED Notes (Signed)
Two unsuccessful IV attempts. Will defer to another RN. 

## 2017-05-25 NOTE — ED Provider Notes (Signed)
[facility] Emergency Department Provider Note ____________________________________________   First MD Initiated Contact with Patient 05/25/17 1247     (approximate)  I have reviewed the triage vital signs and the nursing notes.   HISTORY  Chief Complaint Hyperglycemia    HPI [name] is a 56 y.o. male with a history of diabetes, colon cancer, alcohol abuse, and other past medical history as noted below who presents with hyperglycemia.  The patient was instructed to come to the ED after being told by his cancer doctor that his glucose was very elevated on recent labs.  Patient states that he has been weak over the last several days, but denies other acute symptoms.  He denies alcohol or substance abuse today.  Past Medical History:  Diagnosis Date  . Anemia   . BPH without obstruction/lower urinary tract symptoms   . Cancer ([place])   . Cellulitis of scrotum   . Cyst of prostate 05/13/2015  . Diabetes ([place])   . Difficulty urinating   . Dyspnea   . Epididymoorchitis   . ETOH abuse   . Fracture of right tibia and fibula   . GERD (gastroesophageal reflux disease)   . GI bleed   . Malignant neoplasm of sigmoid colon ([place])   . Pancreatitis   . Trichomoniasis   . Wears dentures    Partial upper and lower.  reports poor fit.    Patient Active Problem List   Diagnosis Date Noted  . Tobacco use disorder 04/21/2017  . 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 Moderate malnutrition ([place]) 02/12/2017  . Pancreatic insufficiency 02/12/2017  . Chronic abdominal pain 01/29/2017  . Malignant neoplasm of sigmoid colon ([place]) 01/15/2017  . Shock ([place]) 12/02/2016  . Acute posthemorrhagic anemia   . Benign neoplasm of ascending colon   . Colorectal polyps   . Noninfectious diarrhea   . Syncope 12/01/2016  . Chronic diarrhea 12/01/2016  . Pancreatitis 04/17/2016  . Noncompliance 07/05/2015  . Tobacco dependence 07/05/2015  . Fracture of right  tibia and fibula 06/05/2015  . Type 2 diabetes mellitus with hyperglycemia ([place]) 06/05/2015  . Prostatic cyst 05/13/2015  . GI bleed 05/12/2015  . Type 2 diabetes mellitus ([place]) 05/12/2015  . GERD (gastroesophageal reflux disease) 05/12/2015  . ETOH abuse 05/12/2015  . BPH (benign prostatic hyperplasia) 05/12/2015    Past Surgical History:  Procedure Laterality Date  . COLONOSCOPY WITH PROPOFOL N/A 12/02/2016   Procedure: COLONOSCOPY WITH PROPOFOL;  Surgeon: Lucilla Lame, MD;  Location: [facility] ENDOSCOPY;  Service: Endoscopy;  Laterality: N/A;  . ESOPHAGOGASTRODUODENOSCOPY (EGD) WITH PROPOFOL N/A 12/02/2016   Procedure: ESOPHAGOGASTRODUODENOSCOPY (EGD) WITH PROPOFOL;  Surgeon: Lucilla Lame, MD;  Location: [facility] ENDOSCOPY;  Service: Endoscopy;  Laterality: N/A;  . FLEXIBLE SIGMOIDOSCOPY N/A 02/05/2017   Procedure: FLEXIBLE SIGMOIDOSCOPY;  Surgeon: Lucilla Lame, MD;  Location: [place];  Service: Gastroenterology;  Laterality: N/A;  Needs labs drawn. Needs to come in early.  No anesthesia  . FRACTURE SURGERY     TIBIA AND FIBULA  . FRACTURE SURGERY    . LAPAROSCOPIC PARTIAL COLECTOMY  04/17/2017   [facility]  . PORTACATH PLACEMENT Right 05/18/2017   Procedure: INSERTION PORT-A-CATH;  Surgeon: Vickie Epley, MD;  Location: [facility] ORS;  Service: Vascular;  Laterality: Right;  . resection of pancreas    . SCROTAL EXPLORATION      Prior to Admission medications   Medication Sig Start Date End Date Taking? Authorizing Provider  LANTUS SOLOSTAR 100 UNIT/ML Solostar  Pen Inject 10 Units into the skin 2 (two) times daily. Patient taking differently: Inject 10 Units into the skin daily at 10 pm.  07/02/16  Yes Harvest Dark, MD  lipase/protease/amylase (CREON) 36000 UNITS CPEP capsule Take 1 capsule (36,000 Units total) 3 (three) times daily before meals by mouth. 05/05/17  Yes Earlie Server, MD  NOVOLOG FLEXPEN 100 UNIT/ML FlexPen Inject 48 Units into the skin 3 (three) times daily with meals.   05/16/15  Yes [provider]  Vitamins/Minerals TABS Take 1 tablet every morning by mouth.  05/14/15  Yes [provider]  amitriptyline (ELAVIL) 25 MG tablet Take 25 mg by mouth. 02/17/17   [provider]  aspirin EC 81 MG tablet Take 81 mg by mouth. 07/20/15   [provider]  B-D UF III MINI PEN NEEDLES 31G X 5 MM MISC  10/17/16   [provider]  Blood Glucose Monitoring Suppl (GLUCOCOM BLOOD GLUCOSE MONITOR) DEVI use as directed 08/13/15   [provider]  cyclobenzaprine (FLEXERIL) 10 MG tablet Take 10 mg by mouth 2 (two) times daily. 02/15/16   [provider]  diphenoxylate-atropine (LOMOTIL) 2.5-0.025 MG tablet take 1 tablet four times a day if needed 01/01/17   [provider]  Oxycodone HCl 10 MG TABS Take 1 tablet (10 mg total) every 6 (six) hours as needed by mouth. 05/18/17   Earlie Server, MD  RA ACETAMINOPHEN 325 MG tablet take 2 tablets by mouth every 6 hours 04/21/17   [provider]  traZODone (DESYREL) 50 MG tablet take 1 tablet by mouth at bedtime TO HELP WITH SLEEP-PRN 12/05/16   [provider]    Allergies Aspirin  Family History  Problem Relation Age of Onset  . Cancer - Colon Father   . Colon cancer Brother     Social History Social History   Tobacco Use  . Smoking status: Current Every Day Smoker    Packs/day: 0.25    Years: 41.00    Pack years: 10.25    Types: Cigarettes  . Smokeless tobacco: Never Used  . Tobacco comment: since age 47  Substance Use Topics  . Alcohol use: Yes    Alcohol/week: 0.0 oz    Comment: occasionally - but says not drinkning currently  . Drug use: No    Review of Systems  Constitutional: No fever/chills.  Eyes: No redness. ENT: No sore throat. Cardiovascular: Denies chest pain. Respiratory: Positive for mild shortness of breath. Gastrointestinal: No nausea, no vomiting.  Positive for diarrhea.  Genitourinary: Negative for dysuria.    Musculoskeletal: Negative for back pain. Skin: Negative for rash. Neurological: Negative for headache.    ____________________________________________   PHYSICAL EXAM:  VITAL SIGNS: ED Triage Vitals [05/25/17 1207]  Enc Vitals Group     BP (!) 81/59     Pulse Rate 99     Resp 17     Temp 97.8 F (36.6 C)     Temp Source Oral     SpO2 100 %     Weight 109 lb (49.4 kg)     Height 5\' 8"  (1.727 m)     Head Circumference      Peak Flow      Pain Score      Pain Loc      Pain Edu?      Excl. in [place]?     Constitutional: Alert and oriented.  Relatively appearing and in no acute distress. Eyes: Conjunctivae are normal.  EOMI.  PERRLA. Head: Atraumatic. Nose: No congestion/rhinnorhea. Mouth/Throat: Mucous membranes are dry.   Neck: Normal range of motion.  Cardiovascular: Normal rate, regular rhythm. Grossly normal heart sounds.  Good peripheral circulation. Respiratory: Normal respiratory effort.  No retractions. Lungs CTAB. Gastrointestinal: Soft and nontender. No distention.  Genitourinary: No CVA tenderness. Musculoskeletal: No lower extremity edema.  Chronic venous stasis changes distally to bilateral lower extremities.  Extremities warm and well perfused.  Neurologic:  Normal speech and language.  Motor intact in all extremities.  No gross focal neurologic deficits are appreciated.  Skin:  Skin is warm and dry. No rash noted. Psychiatric: Mood and affect are normal. Speech and behavior are normal.  ____________________________________________   LABS (all labs ordered are listed, but only abnormal results are displayed)  Labs Reviewed  BASIC METABOLIC PANEL - Abnormal; Notable for the following components:      Result Value   Sodium 132 (*)    Potassium 3.1 (*)    Chloride 93 (*)    Glucose, Bld 695 (*)    BUN <5 (*)    All other components within normal limits  CBC - Abnormal; Notable for the following components:   WBC 12.3 (*)    RBC 3.32 (*)    Hemoglobin  10.5 (*)    HCT 31.3 (*)    RDW 15.0 (*)    All other components within normal limits  URINALYSIS, COMPLETE (UACMP) WITH MICROSCOPIC - Abnormal; Notable for the following components:   Color, Urine STRAW (*)    APPearance CLEAR (*)    Glucose, UA >=500 (*)    Squamous Epithelial / LPF 0-5 (*)    All other components within normal limits  GLUCOSE, CAPILLARY - Abnormal; Notable for the following components:   Glucose-Capillary >600 (*)    All other components within normal limits  BLOOD GAS, VENOUS - Abnormal; Notable for the following components:   pCO2, Ven 43 (*)    pO2, Ven 62.0 (*)    All other components within normal limits  GLUCOSE, CAPILLARY - Abnormal; Notable for the following components:   Glucose-Capillary >600 (*)    All other components within normal limits  GLUCOSE, CAPILLARY - Abnormal; Notable for the following components:   Glucose-Capillary 480 (*)    All other components within normal limits  TROPONIN I  ETHANOL  URINE DRUG SCREEN, QUALITATIVE ([facility] ONLY)  CBG MONITORING, ED   ____________________________________________  EKG  ED ECG REPORT I, Arta Silence, the attending physician, personally viewed and interpreted this ECG.  Date: 05/25/2017 EKG Time: 1233 Rate: 96 Rhythm: normal sinus rhythm QRS Axis: normal Intervals: normal ST/T Wave abnormalities: normal Narrative Interpretation: no evidence of acute ischemia; no significant change when compared to EKG of 11/30/2016  ____________________________________________  RADIOLOGY  CXR:   ____________________________________________   PROCEDURES  Procedure(s) performed: No    Critical Care performed: No ____________________________________________   INITIAL IMPRESSION / ASSESSMENT AND PLAN / ED COURSE  Pertinent labs & imaging results that were available during my care of the patient were reviewed by me and considered in my medical decision making (see chart for  details).  56 year old male with past medical history as noted above presents with hyperglycemia, associate primarily with generalized weakness over the last several days.  Except for some mild diarrhea, patient's review of systems otherwise negative.  Review of past medical records in epic reveals an admission in June of this year for hypovolemic shock after patient became hypotensive after GI procedure, and notes chronic diarrhea  at that time.  History of alcohol abuse confirmed.    On exam, patient is somewhat chronically weak and ill-appearing, but not acutely toxic.  The remainder the exam is as described, with nonfocal neuro, somewhat dry mucous membranes, and no abdominal tenderness.  Differential includes DKA, hyperglycemia without acidosis, and we will workup for precipitating factors for this including infection, alcohol intoxication, or other metabolic abnormality.  Plan for fluids, insulin, lab workup, UA and chest x-ray, and reassess.  ----------------------------------------- 1:22 PM on 05/25/2017 -----------------------------------------  Patient is significantly hyperglycemic, but anion gap is not elevated.  I will give a bolus of insulin, but he does not appear to be in DKA at this time so will not need a drip.     ----------------------------------------- 5:14 PM on 05/25/2017 -----------------------------------------  Patient's lab workup reveals elevated glucose, but no acidosis, no ketones in the urine, no concerning electrolyte abnormalities, and borderline elevated WBC.  Patient is anemic but this is improved from his baseline.  His potassium is also not significantly lowered from his baseline.  Patient does have large amount of WBCs on his UA, suggestive of possible UTI.  Almost as soon as getting his glucose down to 400s, patient insisted on eating.  He is tolerating p.o. without difficulty, and states that he feels significantly better.  Patient absolutely wants to go  home, and feels well at this time to do so.  His repeat fingerstick was in the high 300s, but given the patient states he is very hungry and will eat as soon as he is discharged, I do not expect that we will be able to get the glucose much lower acutely.  And then his workup did not show DKA or other acute complications, he is safe for discharge at this time.  I instructed him to check his sugar frequently, and take his insulin as prescribed.  He was also instructed to follow-up with his primary care doctor, and take the antibiotic as prescribed.  Return precautions given, and patient expresses understanding.    ____________________________________________   FINAL CLINICAL IMPRESSION(S) / ED DIAGNOSES  Final diagnoses:  Hyperglycemia  Urinary tract infection without hematuria, site unspecified      NEW MEDICATIONS STARTED DURING THIS VISIT:  This SmartLink is deprecated. Use AVSMEDLIST instead to display the medication list for a patient.   Note:  This document was prepared using Dragon voice recognition software and may include unintentional dictation errors.    Arta Silence, MD 05/25/17 1719

## 2017-05-25 NOTE — Discharge Instructions (Signed)
Check your sugars frequently and take your insulin as prescribed.  You should also take the antibiotic prescribed today for a possible urinary tract infection.  Follow-up with your primary doctor as soon as possible.  Return to the emergency department for persistently high sugars, weakness, lightheadedness, fevers, vomiting, abdominal pain, or any other new or worsening symptoms that concern you.

## 2017-05-26 ENCOUNTER — Other Ambulatory Visit: Payer: Self-pay | Admitting: *Deleted

## 2017-05-26 ENCOUNTER — Inpatient Hospital Stay: Payer: Medicaid Other

## 2017-05-26 ENCOUNTER — Telehealth: Payer: Self-pay

## 2017-05-26 LAB — CEA: CEA: 13.4 ng/mL — ABNORMAL HIGH (ref 0.0–4.7)

## 2017-05-26 MED ORDER — ONDANSETRON HCL 8 MG PO TABS: 8.0000 mg | ORAL_TABLET | Freq: Three times a day (TID) | ORAL | 0 refills | Status: DC | PRN

## 2017-05-26 MED ORDER — LIDOCAINE-PRILOCAINE 2.5-2.5 % EX CREA: TOPICAL_CREAM | CUTANEOUS | 0 refills | Status: DC

## 2017-05-26 MED ORDER — PROCHLORPERAZINE MALEATE 10 MG PO TABS: 10.0000 mg | ORAL_TABLET | Freq: Four times a day (QID) | ORAL | 0 refills | Status: DC | PRN

## 2017-05-26 NOTE — Telephone Encounter (Signed)
Patient was rescheduled for 12/5.   Mother is aware of location.

## 2017-05-26 NOTE — Telephone Encounter (Signed)
-----   Message from Jonathon Bellows, MD sent at 05/26/2017  8:41 AM EST ----- Hi   He was on my clinic schedule yesterday- didn't show up. Looks like he was in the ER.   I will ask my nurse Vivienne Sangiovanni to reschedule him   Regards  Kiran   C/c Laasya Peyton   ----- Message ----- From: Earlie Server, MD Sent: 05/25/2017   3:25 PM To: Jonathon Bellows, MD  Kiran,  Good afternoon.  I have referred this patient to you to see if you could help to manage his chronic diarrhea, pancreatic insufficiency. He is alcoholic, has stage 3 colon cancer, not compliant but I managed to persuade him finally get his surgery done at [facility].  Need adjuvant chemotherapy, but doubt he will tolerate 5-FU+/- Oxaliplatin.   He has chronic diarrhea, I started him on Creon a few months ago, does not seem to help him too much (he reports taking medication)  Thank you. Let me know your thoughts.   zhou

## 2017-05-28 ENCOUNTER — Emergency Department: Payer: Medicaid Other

## 2017-05-28 ENCOUNTER — Other Ambulatory Visit: Payer: Self-pay

## 2017-05-28 ENCOUNTER — Inpatient Hospital Stay
Admission: EM | Admit: 2017-05-28 | Discharge: 2017-06-04 | DRG: 314 | Disposition: A | Discharge status: Home Health | Payer: Medicaid Other | Attending: Internal Medicine | Admitting: Internal Medicine

## 2017-05-28 DIAGNOSIS — T68XXXA Hypothermia, initial encounter: Secondary | ICD-10-CM

## 2017-05-28 DIAGNOSIS — K861 Other chronic pancreatitis: Secondary | ICD-10-CM | POA: Diagnosis present

## 2017-05-28 DIAGNOSIS — G9349 Other encephalopathy: Secondary | ICD-10-CM | POA: Diagnosis not present

## 2017-05-28 DIAGNOSIS — E876 Hypokalemia: Secondary | ICD-10-CM | POA: Diagnosis present

## 2017-05-28 DIAGNOSIS — T80211A Bloodstream infection due to central venous catheter, initial encounter: Principal | ICD-10-CM | POA: Diagnosis present

## 2017-05-28 DIAGNOSIS — K8689 Other specified diseases of pancreas: Secondary | ICD-10-CM | POA: Diagnosis present

## 2017-05-28 DIAGNOSIS — Z681 Body mass index (BMI) 19 or less, adult: Secondary | ICD-10-CM | POA: Diagnosis not present

## 2017-05-28 DIAGNOSIS — Z7982 Long term (current) use of aspirin: Secondary | ICD-10-CM

## 2017-05-28 DIAGNOSIS — I9581 Postprocedural hypotension: Secondary | ICD-10-CM | POA: Diagnosis not present

## 2017-05-28 DIAGNOSIS — N390 Urinary tract infection, site not specified: Secondary | ICD-10-CM | POA: Diagnosis present

## 2017-05-28 DIAGNOSIS — N492 Inflammatory disorders of scrotum: Secondary | ICD-10-CM | POA: Diagnosis present

## 2017-05-28 DIAGNOSIS — Z9221 Personal history of antineoplastic chemotherapy: Secondary | ICD-10-CM

## 2017-05-28 DIAGNOSIS — C20 Malignant neoplasm of rectum: Secondary | ICD-10-CM | POA: Diagnosis not present

## 2017-05-28 DIAGNOSIS — G9340 Encephalopathy, unspecified: Secondary | ICD-10-CM | POA: Diagnosis present

## 2017-05-28 DIAGNOSIS — F1010 Alcohol abuse, uncomplicated: Secondary | ICD-10-CM | POA: Diagnosis present

## 2017-05-28 DIAGNOSIS — K219 Gastro-esophageal reflux disease without esophagitis: Secondary | ICD-10-CM | POA: Diagnosis present

## 2017-05-28 DIAGNOSIS — F79 Unspecified intellectual disabilities: Secondary | ICD-10-CM | POA: Diagnosis present

## 2017-05-28 DIAGNOSIS — E440 Moderate protein-calorie malnutrition: Secondary | ICD-10-CM | POA: Diagnosis present

## 2017-05-28 DIAGNOSIS — Z9049 Acquired absence of other specified parts of digestive tract: Secondary | ICD-10-CM

## 2017-05-28 DIAGNOSIS — Y848 Other medical procedures as the cause of abnormal reaction of the patient, or of later complication, without mention of misadventure at the time of the procedure: Secondary | ICD-10-CM | POA: Diagnosis present

## 2017-05-28 DIAGNOSIS — Z800 Family history of malignant neoplasm of digestive organs: Secondary | ICD-10-CM

## 2017-05-28 DIAGNOSIS — F0280 Dementia in other diseases classified elsewhere without behavioral disturbance: Secondary | ICD-10-CM | POA: Diagnosis not present

## 2017-05-28 DIAGNOSIS — T80212A Local infection due to central venous catheter, initial encounter: Secondary | ICD-10-CM | POA: Insufficient documentation

## 2017-05-28 DIAGNOSIS — E1165 Type 2 diabetes mellitus with hyperglycemia: Secondary | ICD-10-CM | POA: Diagnosis present

## 2017-05-28 DIAGNOSIS — Z79899 Other long term (current) drug therapy: Secondary | ICD-10-CM

## 2017-05-28 DIAGNOSIS — F17210 Nicotine dependence, cigarettes, uncomplicated: Secondary | ICD-10-CM | POA: Diagnosis present

## 2017-05-28 DIAGNOSIS — E872 Acidosis: Secondary | ICD-10-CM | POA: Diagnosis present

## 2017-05-28 DIAGNOSIS — I082 Rheumatic disorders of both aortic and tricuspid valves: Secondary | ICD-10-CM | POA: Diagnosis present

## 2017-05-28 DIAGNOSIS — Z972 Presence of dental prosthetic device (complete) (partial): Secondary | ICD-10-CM

## 2017-05-28 DIAGNOSIS — E119 Type 2 diabetes mellitus without complications: Secondary | ICD-10-CM

## 2017-05-28 DIAGNOSIS — Z794 Long term (current) use of insulin: Secondary | ICD-10-CM

## 2017-05-28 DIAGNOSIS — Z22322 Carrier or suspected carrier of Methicillin resistant Staphylococcus aureus: Secondary | ICD-10-CM

## 2017-05-28 DIAGNOSIS — T80212D Local infection due to central venous catheter, subsequent encounter: Secondary | ICD-10-CM | POA: Diagnosis not present

## 2017-05-28 DIAGNOSIS — Z79891 Long term (current) use of opiate analgesic: Secondary | ICD-10-CM

## 2017-05-28 DIAGNOSIS — R680 Hypothermia, not associated with low environmental temperature: Secondary | ICD-10-CM | POA: Diagnosis not present

## 2017-05-28 DIAGNOSIS — R64 Cachexia: Secondary | ICD-10-CM | POA: Diagnosis present

## 2017-05-28 DIAGNOSIS — D649 Anemia, unspecified: Secondary | ICD-10-CM | POA: Diagnosis present

## 2017-05-28 DIAGNOSIS — J690 Pneumonitis due to inhalation of food and vomit: Secondary | ICD-10-CM | POA: Diagnosis present

## 2017-05-28 DIAGNOSIS — Z886 Allergy status to analgesic agent status: Secondary | ICD-10-CM

## 2017-05-28 DIAGNOSIS — A412 Sepsis due to unspecified staphylococcus: Secondary | ICD-10-CM | POA: Diagnosis present

## 2017-05-28 DIAGNOSIS — Z90411 Acquired partial absence of pancreas: Secondary | ICD-10-CM

## 2017-05-28 DIAGNOSIS — A419 Sepsis, unspecified organism: Secondary | ICD-10-CM | POA: Diagnosis not present

## 2017-05-28 DIAGNOSIS — C187 Malignant neoplasm of sigmoid colon: Secondary | ICD-10-CM | POA: Diagnosis present

## 2017-05-28 DIAGNOSIS — N400 Enlarged prostate without lower urinary tract symptoms: Secondary | ICD-10-CM | POA: Diagnosis present

## 2017-05-28 DIAGNOSIS — B9689 Other specified bacterial agents as the cause of diseases classified elsewhere: Secondary | ICD-10-CM | POA: Diagnosis present

## 2017-05-28 DIAGNOSIS — R6521 Severe sepsis with septic shock: Secondary | ICD-10-CM | POA: Diagnosis present

## 2017-05-28 DIAGNOSIS — A4101 Sepsis due to Methicillin susceptible Staphylococcus aureus: Secondary | ICD-10-CM | POA: Diagnosis not present

## 2017-05-28 LAB — CBC WITH DIFFERENTIAL/PLATELET
BASOS ABS: 0.1 10*3/uL (ref 0–0.1)
Basophils Relative: 0 %
Eosinophils Absolute: 0 10*3/uL (ref 0–0.7)
Eosinophils Relative: 0 %
HEMATOCRIT: 28.5 % — AB (ref 40.0–52.0)
Hemoglobin: 9.4 g/dL — ABNORMAL LOW (ref 13.0–18.0)
LYMPHS ABS: 1 10*3/uL (ref 1.0–3.6)
LYMPHS PCT: 4 %
MCH: 30.8 pg (ref 26.0–34.0)
MCHC: 33 g/dL (ref 32.0–36.0)
MCV: 93.2 fL (ref 80.0–100.0)
MONO ABS: 2 10*3/uL — AB (ref 0.2–1.0)
MONOS PCT: 8 %
NEUTROS ABS: 20.6 10*3/uL — AB (ref 1.4–6.5)
Neutrophils Relative %: 88 %
Platelets: 172 10*3/uL (ref 150–440)
RBC: 3.06 MIL/uL — ABNORMAL LOW (ref 4.40–5.90)
RDW: 14.7 % — AB (ref 11.5–14.5)
WBC: 23.7 10*3/uL — ABNORMAL HIGH (ref 3.8–10.6)

## 2017-05-28 LAB — COMPREHENSIVE METABOLIC PANEL
ALK PHOS: 142 U/L — AB (ref 38–126)
ALT: 20 U/L (ref 17–63)
AST: 23 U/L (ref 15–41)
Albumin: 2.7 g/dL — ABNORMAL LOW (ref 3.5–5.0)
Anion gap: 10 (ref 5–15)
BILIRUBIN TOTAL: 0.3 mg/dL (ref 0.3–1.2)
BUN: 8 mg/dL (ref 6–20)
CALCIUM: 8.8 mg/dL — AB (ref 8.9–10.3)
CHLORIDE: 100 mmol/L — AB (ref 101–111)
CO2: 28 mmol/L (ref 22–32)
CREATININE: 0.65 mg/dL (ref 0.61–1.24)
Glucose, Bld: 98 mg/dL (ref 65–99)
Potassium: 3.3 mmol/L — ABNORMAL LOW (ref 3.5–5.1)
Sodium: 138 mmol/L (ref 135–145)
TOTAL PROTEIN: 6.3 g/dL — AB (ref 6.5–8.1)

## 2017-05-28 LAB — PROTIME-INR
INR: 1.11
Prothrombin Time: 14.2 seconds (ref 11.4–15.2)

## 2017-05-28 LAB — LACTIC ACID, PLASMA
Lactic Acid, Venous: 3 mmol/L (ref 0.5–1.9)
Lactic Acid, Venous: 1.2 mmol/L (ref 0.5–1.9)
Lactic Acid, Venous: 1.5 mmol/L (ref 0.5–1.9)

## 2017-05-28 LAB — GLUCOSE, CAPILLARY
GLUCOSE-CAPILLARY: 112 mg/dL — AB (ref 65–99)
GLUCOSE-CAPILLARY: 144 mg/dL — AB (ref 65–99)

## 2017-05-28 LAB — TROPONIN I: TROPONIN I: 0.07 ng/mL — AB (ref <0.03)

## 2017-05-28 MED ORDER — ACETAMINOPHEN 650 MG RE SUPP: 650.0000 mg | Freq: Four times a day (QID) | RECTAL | Status: DC | PRN

## 2017-05-28 MED ORDER — ACETAMINOPHEN 325 MG PO TABS
650.0000 mg | ORAL_TABLET | Freq: Four times a day (QID) | ORAL | Status: DC | PRN
  Administered 2017-06-01: 650 mg via ORAL
  Filled 2017-05-28: qty 2

## 2017-05-28 MED ORDER — SODIUM CHLORIDE 0.9 % IV BOLUS (SEPSIS)
500.0000 mL | Freq: Once | INTRAVENOUS | Status: AC
  Administered 2017-05-28: 500 mL via INTRAVENOUS

## 2017-05-28 MED ORDER — ONDANSETRON HCL 4 MG/2ML IJ SOLN: 4.0000 mg | Freq: Four times a day (QID) | INTRAMUSCULAR | Status: DC | PRN

## 2017-05-28 MED ORDER — THIAMINE HCL 100 MG/ML IJ SOLN
100.0000 mg | Freq: Every day | INTRAMUSCULAR | Status: DC
  Administered 2017-05-30 – 2017-06-01 (×3): 100 mg via INTRAVENOUS
  Filled 2017-05-28 (×4): qty 2

## 2017-05-28 MED ORDER — INSULIN ASPART 100 UNIT/ML ~~LOC~~ SOLN
0.0000 [IU] | Freq: Four times a day (QID) | SUBCUTANEOUS | Status: DC
  Administered 2017-05-29: 2 [IU] via SUBCUTANEOUS
  Administered 2017-05-30: 0 [IU] via SUBCUTANEOUS
  Administered 2017-05-30 (×2): 1 [IU] via SUBCUTANEOUS
  Administered 2017-05-31: 2 [IU] via SUBCUTANEOUS
  Administered 2017-05-31: 0 [IU] via SUBCUTANEOUS
  Administered 2017-06-01: 2 [IU] via SUBCUTANEOUS
  Administered 2017-06-01: 1 [IU] via SUBCUTANEOUS
  Administered 2017-06-02 – 2017-06-03 (×4): 2 [IU] via SUBCUTANEOUS
  Filled 2017-05-28 (×9): qty 1

## 2017-05-28 MED ORDER — SODIUM CHLORIDE 0.9 % IV SOLN
INTRAVENOUS | Status: DC
  Administered 2017-05-28: 23:00:00 via INTRAVENOUS

## 2017-05-28 MED ORDER — VITAMIN B-1 100 MG PO TABS
100.0000 mg | ORAL_TABLET | Freq: Every day | ORAL | Status: DC
  Administered 2017-06-02 – 2017-06-03 (×2): 100 mg via ORAL
  Filled 2017-05-28 (×6): qty 1

## 2017-05-28 MED ORDER — VANCOMYCIN HCL IN DEXTROSE 1-5 GM/200ML-% IV SOLN
1000.0000 mg | INTRAVENOUS | Status: DC
  Administered 2017-05-29 – 2017-05-30 (×2): 1000 mg via INTRAVENOUS
  Filled 2017-05-28 (×4): qty 200

## 2017-05-28 MED ORDER — ONDANSETRON HCL 4 MG PO TABS: 4.0000 mg | ORAL_TABLET | Freq: Four times a day (QID) | ORAL | Status: DC | PRN

## 2017-05-28 MED ORDER — ADULT MULTIVITAMIN W/MINERALS CH
1.0000 | ORAL_TABLET | Freq: Every day | ORAL | Status: DC
  Administered 2017-06-02 – 2017-06-03 (×2): 1 via ORAL
  Filled 2017-05-28 (×5): qty 1

## 2017-05-28 MED ORDER — FENTANYL CITRATE (PF) 100 MCG/2ML IJ SOLN
50.0000 ug | INTRAMUSCULAR | Status: DC | PRN
  Administered 2017-05-28: 50 ug via INTRAVENOUS
  Filled 2017-05-28: qty 2

## 2017-05-28 MED ORDER — LORAZEPAM 2 MG/ML IJ SOLN
0.0000 mg | Freq: Two times a day (BID) | INTRAMUSCULAR | Status: AC
  Administered 2017-05-31 (×3): 2 mg via INTRAVENOUS
  Administered 2017-06-01: 1 mg via INTRAVENOUS
  Filled 2017-05-28 (×5): qty 1

## 2017-05-28 MED ORDER — SODIUM CHLORIDE 0.9 % IV BOLUS (SEPSIS)
1000.0000 mL | Freq: Once | INTRAVENOUS | Status: AC
  Administered 2017-05-28: 1000 mL via INTRAVENOUS

## 2017-05-28 MED ORDER — ENOXAPARIN SODIUM 40 MG/0.4ML ~~LOC~~ SOLN
40.0000 mg | SUBCUTANEOUS | Status: DC
  Administered 2017-05-30 – 2017-06-02 (×4): 40 mg via SUBCUTANEOUS
  Filled 2017-05-28 (×6): qty 0.4

## 2017-05-28 MED ORDER — VANCOMYCIN HCL IN DEXTROSE 1-5 GM/200ML-% IV SOLN
1000.0000 mg | Freq: Once | INTRAVENOUS | Status: AC
  Administered 2017-05-28: 1000 mg via INTRAVENOUS
  Filled 2017-05-28: qty 200

## 2017-05-28 MED ORDER — FOLIC ACID 1 MG PO TABS
1.0000 mg | ORAL_TABLET | Freq: Every day | ORAL | Status: DC
  Administered 2017-06-02 – 2017-06-03 (×2): 1 mg via ORAL
  Filled 2017-05-28 (×5): qty 1

## 2017-05-28 MED ORDER — LORAZEPAM 1 MG PO TABS: 1.0000 mg | ORAL_TABLET | Freq: Four times a day (QID) | ORAL | Status: AC | PRN

## 2017-05-28 MED ORDER — PIPERACILLIN-TAZOBACTAM 3.375 G IVPB
3.3750 g | Freq: Three times a day (TID) | INTRAVENOUS | Status: DC
Start: 2017-05-29 — End: 2017-05-29
  Administered 2017-05-29 (×2): 3.375 g via INTRAVENOUS
  Filled 2017-05-28 (×2): qty 50

## 2017-05-28 MED ORDER — LORAZEPAM 2 MG/ML IJ SOLN
1.0000 mg | Freq: Four times a day (QID) | INTRAMUSCULAR | Status: AC | PRN
  Administered 2017-05-30 – 2017-05-31 (×4): 1 mg via INTRAVENOUS
  Filled 2017-05-28 (×4): qty 1

## 2017-05-28 MED ORDER — MORPHINE SULFATE (PF) 2 MG/ML IV SOLN
2.0000 mg | INTRAVENOUS | Status: DC | PRN
  Administered 2017-05-31 – 2017-06-01 (×2): 2 mg via INTRAVENOUS
  Filled 2017-05-28 (×3): qty 1

## 2017-05-28 MED ORDER — PIPERACILLIN-TAZOBACTAM 3.375 G IVPB 30 MIN
3.3750 g | Freq: Once | INTRAVENOUS | Status: AC
  Administered 2017-05-28: 3.375 g via INTRAVENOUS
  Filled 2017-05-28: qty 50

## 2017-05-28 MED ORDER — LORAZEPAM 2 MG/ML IJ SOLN
0.0000 mg | Freq: Four times a day (QID) | INTRAMUSCULAR | Status: AC
  Administered 2017-05-28 – 2017-05-29 (×3): 2 mg via INTRAVENOUS
  Administered 2017-05-29 – 2017-05-30 (×2): 4 mg via INTRAVENOUS
  Administered 2017-05-30: 1 mg via INTRAVENOUS
  Administered 2017-05-30: 4 mg via INTRAVENOUS
  Filled 2017-05-28: qty 2
  Filled 2017-05-28: qty 1
  Filled 2017-05-28: qty 2
  Filled 2017-05-28: qty 1
  Filled 2017-05-28: qty 2
  Filled 2017-05-28: qty 1

## 2017-05-28 NOTE — ED Notes (Signed)
Patient transported to X-ray 

## 2017-05-28 NOTE — H&P (View-Only) (Signed)
Date of Consultation:  05/28/2017  Requesting Physician:  Merlyn Lot, MD  Reason for Consultation:  Infected Port-a-cath  History of Present Illness: [name] is a 56 y.o. male s/p right IJ port-a-cath placement with Dr. Rosana Hoes on 11/19 for advanced rectal cancer.  He presents to the ED with altered mental status, hypotension, and concern for sepsis.  Due to his altered mental status, HPI is obtained from girlfriend and sister who are at bedside with him.  Per their report, he started having altered mental status about a week ago, and has been more severe over the past three days.  He has not had any fevers, but has had chills over the past few days.  He had been able to eat and drink, though this has been decreasing, particularly today that his mental status is even worse.  Denies any drainage from the port-a-cath incision, but has been having swelling and erythema of the area.  Denies any abdominal pain.  Past Medical History: Past Medical History:  Diagnosis Date  . Anemia   . BPH without obstruction/lower urinary tract symptoms   . Cancer ([place])   . Cellulitis of scrotum   . Cyst of prostate 05/13/2015  . Diabetes ([place])   . Difficulty urinating   . Dyspnea   . Epididymoorchitis   . ETOH abuse   . Fracture of right tibia and fibula   . GERD (gastroesophageal reflux disease)   . GI bleed   . Malignant neoplasm of sigmoid colon ([place])   . Pancreatitis   . Trichomoniasis   . Wears dentures    Partial upper and lower.  reports poor fit.     Past Surgical History: Past Surgical History:  Procedure Laterality Date  . COLONOSCOPY WITH PROPOFOL N/A 12/02/2016   Procedure: COLONOSCOPY WITH PROPOFOL;  Surgeon: Lucilla Lame, MD;  Location: [facility] ENDOSCOPY;  Service: Endoscopy;  Laterality: N/A;  . ESOPHAGOGASTRODUODENOSCOPY (EGD) WITH PROPOFOL N/A 12/02/2016   Procedure: ESOPHAGOGASTRODUODENOSCOPY (EGD) WITH PROPOFOL;  Surgeon: Lucilla Lame, MD;  Location: [facility] ENDOSCOPY;  Service:  Endoscopy;  Laterality: N/A;  . FLEXIBLE SIGMOIDOSCOPY N/A 02/05/2017   Procedure: FLEXIBLE SIGMOIDOSCOPY;  Surgeon: Lucilla Lame, MD;  Location: [place];  Service: Gastroenterology;  Laterality: N/A;  Needs labs drawn. Needs to come in early.  No anesthesia  . FRACTURE SURGERY     TIBIA AND FIBULA  . FRACTURE SURGERY    . LAPAROSCOPIC PARTIAL COLECTOMY  04/17/2017   [facility]  . PORTACATH PLACEMENT Right 05/18/2017   Procedure: INSERTION PORT-A-CATH;  Surgeon: Vickie Epley, MD;  Location: [facility] ORS;  Service: Vascular;  Laterality: Right;  . resection of pancreas    . SCROTAL EXPLORATION      Home Medications: Prior to Admission medications   Medication Sig Start Date End Date Taking? Authorizing Provider  amitriptyline (ELAVIL) 25 MG tablet Take 25 mg by mouth. 02/17/17  Yes [provider]  aspirin EC 81 MG tablet Take 81 mg by mouth. 07/20/15  Yes [provider]  cephALEXin (KEFLEX) 500 MG capsule Take 1 capsule (500 mg total) by mouth 2 (two) times daily for 7 days. 05/26/17 06/02/17 Yes Arta Silence, MD  cyclobenzaprine (FLEXERIL) 10 MG tablet Take 10 mg by mouth 2 (two) times daily. 02/15/16  Yes [provider]  LANTUS SOLOSTAR 100 UNIT/ML Solostar Pen Inject 10 Units into the skin 2 (two) times daily. Patient taking differently: Inject 10 Units into the skin daily at 10 pm.  07/02/16  Yes Paduchowski, Lennette Bihari,  MD  lipase/protease/amylase (CREON) 36000 UNITS CPEP capsule Take 1 capsule (36,000 Units total) 3 (three) times daily before meals by mouth. 05/05/17  Yes Earlie Server, MD  NOVOLOG FLEXPEN 100 UNIT/ML FlexPen Inject 48 Units into the skin 3 (three) times daily with meals.  05/16/15  Yes [provider]  Vitamins/Minerals TABS Take 1 tablet every morning by mouth.  05/14/15  Yes [provider]  B-D UF III MINI PEN NEEDLES 31G X 5 MM MISC  10/17/16   [provider]  Blood Glucose Monitoring Suppl (GLUCOCOM BLOOD  GLUCOSE MONITOR) DEVI use as directed 08/13/15   [provider]  diphenoxylate-atropine (LOMOTIL) 2.5-0.025 MG tablet take 1 tablet four times a day if needed 01/01/17   [provider]  lidocaine-prilocaine (EMLA) cream Apply over porta cath 1-2 hours prior to chemo. 05/26/17   Earlie Server, MD  ondansetron (ZOFRAN) 8 MG tablet Take 1 tablet (8 mg total) by mouth every 8 (eight) hours as needed for nausea or vomiting. 05/26/17   Earlie Server, MD  Oxycodone HCl 10 MG TABS Take 1 tablet (10 mg total) every 6 (six) hours as needed by mouth. 05/18/17   Earlie Server, MD  prochlorperazine (COMPAZINE) 10 MG tablet Take 1 tablet (10 mg total) by mouth every 6 (six) hours as needed for nausea or vomiting. 05/26/17   Earlie Server, MD  RA ACETAMINOPHEN 325 MG tablet take 2 tablets by mouth every 6 hours 04/21/17   [provider]  traZODone (DESYREL) 50 MG tablet take 1 tablet by mouth at bedtime TO HELP WITH SLEEP-PRN 12/05/16   [provider]    Allergies: Allergies  Allergen Reactions  . Aspirin Itching and Other (See Comments)    Reaction: abdominal pain    Social History:  reports that he has been smoking cigarettes.  He has a 10.25 pack-year smoking history. he has never used smokeless tobacco. He reports that he drinks alcohol. He reports that he does not use drugs.   Family History: Family History  Problem Relation Age of Onset  . Cancer - Colon Father   . Colon cancer Brother     Review of Systems: Review of Systems  Unable to perform ROS: Mental status change    Physical Exam BP 94/68 (BP Location: Right Arm)   Pulse 88   Temp 98.1 F (36.7 C) (Oral)   Resp 16   Ht 5\' 7"  (1.702 m)   Wt 47.6 kg (105 lb)   SpO2 100%   BMI 16.45 kg/m  CONSTITUTIONAL: Altered, unable to engage well in conversation, but in no acute distress. HEENT:  Normocephalic, atraumatic. NECK: Trachea is midline, and there is no jugular venous distension.  Right IJ port-a-cath in place,  tunneling subcutaneously from right chest to right IJ. LYMPH NODES:  Lymph nodes in the neck are not enlarged. RESPIRATORY:  Lungs are clear, and breath sounds are equal bilaterally. Normal respiratory effort without pathologic use of accessory muscles. CARDIOVASCULAR: Heart is regular without murmurs, gallops, or rubs. GI: The abdomen is soft, nondistended, nontender to palpation. SKIN: Right chest port-a-cath site with erythema and fluctuance.  No active drainage.  NEUROLOGIC:  Motor and sensation is grossly normal.  Cranial nerves are grossly intact. PSYCH:  Altered mental status, responsive to name.   Laboratory Analysis: Results for orders placed or performed during the hospital encounter of 05/28/17 (from the past 24 hour(s))  Glucose, capillary     Status: Abnormal   Collection Time: 05/28/17  5:08 PM  Result Value Ref Range   Glucose-Capillary 112 (H) 65 - 99 mg/dL  CBC with Differential/Platelet     Status: Abnormal   Collection Time: 05/28/17  6:20 PM  Result Value Ref Range   WBC 23.7 (H) 3.8 - 10.6 K/uL   RBC 3.06 (L) 4.40 - 5.90 MIL/uL   Hemoglobin 9.4 (L) 13.0 - 18.0 g/dL   HCT 28.5 (L) 40.0 - 52.0 %   MCV 93.2 80.0 - 100.0 fL   MCH 30.8 26.0 - 34.0 pg   MCHC 33.0 32.0 - 36.0 g/dL   RDW 14.7 (H) 11.5 - 14.5 %   Platelets 172 150 - 440 K/uL   Neutrophils Relative % 88 %   Neutro Abs 20.6 (H) 1.4 - 6.5 K/uL   Lymphocytes Relative 4 %   Lymphs Abs 1.0 1.0 - 3.6 K/uL   Monocytes Relative 8 %   Monocytes Absolute 2.0 (H) 0.2 - 1.0 K/uL   Eosinophils Relative 0 %   Eosinophils Absolute 0.0 0 - 0.7 K/uL   Basophils Relative 0 %   Basophils Absolute 0.1 0 - 0.1 K/uL  Comprehensive metabolic panel     Status: Abnormal   Collection Time: 05/28/17  6:20 PM  Result Value Ref Range   Sodium 138 135 - 145 mmol/L   Potassium 3.3 (L) 3.5 - 5.1 mmol/L   Chloride 100 (L) 101 - 111 mmol/L   CO2 28 22 - 32 mmol/L   Glucose, Bld 98 65 - 99 mg/dL   BUN 8 6 - 20 mg/dL    Creatinine, Ser 0.65 0.61 - 1.24 mg/dL   Calcium 8.8 (L) 8.9 - 10.3 mg/dL   Total Protein 6.3 (L) 6.5 - 8.1 g/dL   Albumin 2.7 (L) 3.5 - 5.0 g/dL   AST 23 15 - 41 U/L   ALT 20 17 - 63 U/L   Alkaline Phosphatase 142 (H) 38 - 126 U/L   Total Bilirubin 0.3 0.3 - 1.2 mg/dL   GFR calc non Af Amer >60 >60 mL/min   GFR calc Af Amer >60 >60 mL/min   Anion gap 10 5 - 15  Lactic acid, plasma     Status: Abnormal   Collection Time: 05/28/17  6:20 PM  Result Value Ref Range   Lactic Acid, Venous 3.0 (HH) 0.5 - 1.9 mmol/L  Protime-INR     Status: None   Collection Time: 05/28/17  6:20 PM  Result Value Ref Range   Prothrombin Time 14.2 11.4 - 15.2 seconds   INR 1.11   Blood gas, venous     Status: Abnormal (Preliminary result)   Collection Time: 05/28/17  6:22 PM  Result Value Ref Range   FIO2 PENDING    pH, Ven 7.35 7.250 - 7.430   pCO2, Ven 54 44.0 - 60.0 mmHg   pO2, Ven <31.0 (LL) 32.0 - 45.0 mmHg   Bicarbonate 29.8 (H) 20.0 - 28.0 mmol/L   Acid-Base Excess 3.3 (H) 0.0 - 2.0 mmol/L   Patient temperature 37.0    Collection site VEIN    Sample type VENOUS   Lactic acid, plasma     Status: None   Collection Time: 05/28/17  8:18 PM  Result Value Ref Range   Lactic Acid, Venous 1.2 0.5 - 1.9 mmol/L  Glucose, capillary     Status: Abnormal   Collection Time: 05/28/17  8:32 PM  Result Value Ref Range   Glucose-Capillary 144 (H) 65 - 99 mg/dL    Imaging: Ct Head Wo Contrast  Result Date: 05/28/2017 CLINICAL DATA:  Initial evaluation for acute altered mental status, increased confusion. History of prostate cancer. EXAM: CT HEAD WITHOUT CONTRAST TECHNIQUE: Contiguous axial images were obtained from the base of the skull through the vertex without intravenous contrast. COMPARISON:  Prior CT from 11/30/2016. FINDINGS: Brain: Stable atrophy with chronic small vessel ischemic disease. No acute intracranial hemorrhage. No acute large vessel territory infarct. No mass lesion, midline shift or mass  effect. No hydrocephalus. No extra-axial fluid collection. Vascular: No hyperdense vessel. Scattered vascular calcifications noted within the carotid siphons. Skull: Scalp soft tissues and calvarium within normal limits. Sinuses/Orbits: Globes and orbital soft tissues normal. Paranasal sinuses and mastoid air cells are clear. Other: None. IMPRESSION: 1. No acute intracranial abnormality. 2. Stable atrophy with chronic small vessel ischemic disease. Electronically Signed   By: Jeannine Boga M.D.   On: 05/28/2017 18:21   Dg Chest Portable 1 View  Result Date: 05/28/2017 CLINICAL DATA:  Altered mental status. Prostate cancer and pancreatitis. EXAM: PORTABLE CHEST 1 VIEW COMPARISON:  05/25/2017 FINDINGS: Right Port-A-Cath terminates at the mid SVC. Midline trachea. Normal heart size and mediastinal contours. No pleural effusion or pneumothorax. Remote bilateral rib fractures. IMPRESSION: No acute cardiopulmonary disease. Electronically Signed   By: Abigail Miyamoto M.D.   On: 05/28/2017 17:39    Assessment and Plan: This is a 56 y.o. male who presents with sepsis type picture with likely infected port-a-cath.  I have independently viewed the patient's imaging studies and reviewed his laboratory studies.  Overall, head CT and CXR without acute pathology.  Labs with elevated WBC of 23.7 with left shift, lactic acid 3.0.  Patient has an infected port-a-cath, with palpable fluctuance and with surrounding erythema.  Blood cultures have been obtained in ED.  He will be admitted to medical team.  Will require broad spectrum antibiotics and IV fluid hydration.  Currently his mental status is altered and would recommend against oral intake.  However, if he becomes more responsive, he can eat until midnight.  He should be NPO after midnight and will be taken to OR tomorrow 11/30 for port-a-cath removal.  As he recovers from this septic episode, and remains with negative blood cultures, can have further discussion in  the future about new port-a-cath placement in contralateral side.  Patient's sister and girlfriend understand this plan and all of their questions have been answered.   Melvyn Neth, [place]

## 2017-05-28 NOTE — Progress Notes (Signed)
Pharmacy Antibiotic Note  [name] is a 56 y.o. male admitted on 05/28/2017 with sepsis/infected port-a-cath.  Pharmacy has been consulted for vancomycin and Zosyn dosing.  Plan: Zosyn 3.375g IV q8h (4 hour infusion).   DW 48kg  Vd 34L kei 0.062 hr-1  T1/2 11 hours. Vancomycin 1 gram q 18 hours ordered with stacked dosing. Level before 5th dose. Goal trough 15-20  Height: 5\' 7"  (170.2 cm) Weight: 105 lb (47.6 kg) IBW/kg (Calculated) : 66.1  Temp (24hrs), Avg:98.5 F (36.9 C), Min:98.1 F (36.7 C), Max:98.9 F (37.2 C)  Recent Labs  Lab 05/25/17 1058 05/25/17 1209 05/28/17 1820 05/28/17 2018  WBC 10.9* 12.3* 23.7*  --   CREATININE 0.76 0.83 0.65  --   LATICACIDVEN  --   --  3.0* 1.2    Estimated Creatinine Clearance: 68.6 mL/min (by C-G formula based on SCr of 0.65 mg/dL).    Allergies  Allergen Reactions  . Aspirin Itching and Other (See Comments)    Reaction: abdominal pain    Antimicrobials this admission: Vancomycin, Zosyn 11/29 >>    >>   Dose adjustments this admission:   Microbiology results: 11/29 BCx: pending 11/29 UCx: pending 11/19 MRSA PCR: (-)      11/29 CXR: no acute disease 11/29 UA: pending Thank you for allowing pharmacy to be a part of this patient's care.  [name] S 05/28/2017 11:11 PM

## 2017-05-28 NOTE — Consult Note (Signed)
Date of Consultation:  05/28/2017  Requesting Physician:  Merlyn Lot, MD  Reason for Consultation:  Infected Port-a-cath  History of Present Illness: [name] is a 56 y.o. male s/p right IJ port-a-cath placement with Dr. Rosana Hoes on 11/19 for advanced rectal cancer.  He presents to the ED with altered mental status, hypotension, and concern for sepsis.  Due to his altered mental status, HPI is obtained from girlfriend and sister who are at bedside with him.  Per their report, he started having altered mental status about a week ago, and has been more severe over the past three days.  He has not had any fevers, but has had chills over the past few days.  He had been able to eat and drink, though this has been decreasing, particularly today that his mental status is even worse.  Denies any drainage from the port-a-cath incision, but has been having swelling and erythema of the area.  Denies any abdominal pain.  Past Medical History: Past Medical History:  Diagnosis Date  . Anemia   . BPH without obstruction/lower urinary tract symptoms   . Cancer ([place])   . Cellulitis of scrotum   . Cyst of prostate 05/13/2015  . Diabetes ([place])   . Difficulty urinating   . Dyspnea   . Epididymoorchitis   . ETOH abuse   . Fracture of right tibia and fibula   . GERD (gastroesophageal reflux disease)   . GI bleed   . Malignant neoplasm of sigmoid colon ([place])   . Pancreatitis   . Trichomoniasis   . Wears dentures    Partial upper and lower.  reports poor fit.     Past Surgical History: Past Surgical History:  Procedure Laterality Date  . COLONOSCOPY WITH PROPOFOL N/A 12/02/2016   Procedure: COLONOSCOPY WITH PROPOFOL;  Surgeon: Lucilla Lame, MD;  Location: [facility] ENDOSCOPY;  Service: Endoscopy;  Laterality: N/A;  . ESOPHAGOGASTRODUODENOSCOPY (EGD) WITH PROPOFOL N/A 12/02/2016   Procedure: ESOPHAGOGASTRODUODENOSCOPY (EGD) WITH PROPOFOL;  Surgeon: Lucilla Lame, MD;  Location: [facility] ENDOSCOPY;  Service:  Endoscopy;  Laterality: N/A;  . FLEXIBLE SIGMOIDOSCOPY N/A 02/05/2017   Procedure: FLEXIBLE SIGMOIDOSCOPY;  Surgeon: Lucilla Lame, MD;  Location: [facility];  Service: Gastroenterology;  Laterality: N/A;  Needs labs drawn. Needs to come in early.  No anesthesia  . FRACTURE SURGERY     TIBIA AND FIBULA  . FRACTURE SURGERY    . LAPAROSCOPIC PARTIAL COLECTOMY  04/17/2017   [facility]  . PORTACATH PLACEMENT Right 05/18/2017   Procedure: INSERTION PORT-A-CATH;  Surgeon: Vickie Epley, MD;  Location: [facility] ORS;  Service: Vascular;  Laterality: Right;  . resection of pancreas    . SCROTAL EXPLORATION      Home Medications: Prior to Admission medications   Medication Sig Start Date End Date Taking? Authorizing Provider  amitriptyline (ELAVIL) 25 MG tablet Take 25 mg by mouth. 02/17/17  Yes [provider]  aspirin EC 81 MG tablet Take 81 mg by mouth. 07/20/15  Yes [provider]  cephALEXin (KEFLEX) 500 MG capsule Take 1 capsule (500 mg total) by mouth 2 (two) times daily for 7 days. 05/26/17 06/02/17 Yes Arta Silence, MD  cyclobenzaprine (FLEXERIL) 10 MG tablet Take 10 mg by mouth 2 (two) times daily. 02/15/16  Yes [provider]  LANTUS SOLOSTAR 100 UNIT/ML Solostar Pen Inject 10 Units into the skin 2 (two) times daily. Patient taking differently: Inject 10 Units into the skin daily at 10 pm.  07/02/16  Yes Paduchowski, Lennette Bihari,  MD  lipase/protease/amylase (CREON) 36000 UNITS CPEP capsule Take 1 capsule (36,000 Units total) 3 (three) times daily before meals by mouth. 05/05/17  Yes Earlie Server, MD  NOVOLOG FLEXPEN 100 UNIT/ML FlexPen Inject 48 Units into the skin 3 (three) times daily with meals.  05/16/15  Yes [provider]  Vitamins/Minerals TABS Take 1 tablet every morning by mouth.  05/14/15  Yes [provider]  B-D UF III MINI PEN NEEDLES 31G X 5 MM MISC  10/17/16   [provider]  Blood Glucose Monitoring Suppl (GLUCOCOM BLOOD  GLUCOSE MONITOR) DEVI use as directed 08/13/15   [provider]  diphenoxylate-atropine (LOMOTIL) 2.5-0.025 MG tablet take 1 tablet four times a day if needed 01/01/17   [provider]  lidocaine-prilocaine (EMLA) cream Apply over porta cath 1-2 hours prior to chemo. 05/26/17   Earlie Server, MD  ondansetron (ZOFRAN) 8 MG tablet Take 1 tablet (8 mg total) by mouth every 8 (eight) hours as needed for nausea or vomiting. 05/26/17   Earlie Server, MD  Oxycodone HCl 10 MG TABS Take 1 tablet (10 mg total) every 6 (six) hours as needed by mouth. 05/18/17   Earlie Server, MD  prochlorperazine (COMPAZINE) 10 MG tablet Take 1 tablet (10 mg total) by mouth every 6 (six) hours as needed for nausea or vomiting. 05/26/17   Earlie Server, MD  RA ACETAMINOPHEN 325 MG tablet take 2 tablets by mouth every 6 hours 04/21/17   [provider]  traZODone (DESYREL) 50 MG tablet take 1 tablet by mouth at bedtime TO HELP WITH SLEEP-PRN 12/05/16   [provider]    Allergies: Allergies  Allergen Reactions  . Aspirin Itching and Other (See Comments)    Reaction: abdominal pain    Social History:  reports that he has been smoking cigarettes.  He has a 10.25 pack-year smoking history. he has never used smokeless tobacco. He reports that he drinks alcohol. He reports that he does not use drugs.   Family History: Family History  Problem Relation Age of Onset  . Cancer - Colon Father   . Colon cancer Brother     Review of Systems: Review of Systems  Unable to perform ROS: Mental status change    Physical Exam BP 94/68 (BP Location: Right Arm)   Pulse 88   Temp 98.1 F (36.7 C) (Oral)   Resp 16   Ht 5\' 7"  (1.702 m)   Wt 47.6 kg (105 lb)   SpO2 100%   BMI 16.45 kg/m  CONSTITUTIONAL: Altered, unable to engage well in conversation, but in no acute distress. HEENT:  Normocephalic, atraumatic. NECK: Trachea is midline, and there is no jugular venous distension.  Right IJ port-a-cath in place,  tunneling subcutaneously from right chest to right IJ. LYMPH NODES:  Lymph nodes in the neck are not enlarged. RESPIRATORY:  Lungs are clear, and breath sounds are equal bilaterally. Normal respiratory effort without pathologic use of accessory muscles. CARDIOVASCULAR: Heart is regular without murmurs, gallops, or rubs. GI: The abdomen is soft, nondistended, nontender to palpation. SKIN: Right chest port-a-cath site with erythema and fluctuance.  No active drainage.  NEUROLOGIC:  Motor and sensation is grossly normal.  Cranial nerves are grossly intact. PSYCH:  Altered mental status, responsive to name.   Laboratory Analysis: Results for orders placed or performed during the hospital encounter of 05/28/17 (from the past 24 hour(s))  Glucose, capillary     Status: Abnormal   Collection Time: 05/28/17  5:08 PM  Result Value Ref Range   Glucose-Capillary 112 (H) 65 - 99 mg/dL  CBC with Differential/Platelet     Status: Abnormal   Collection Time: 05/28/17  6:20 PM  Result Value Ref Range   WBC 23.7 (H) 3.8 - 10.6 K/uL   RBC 3.06 (L) 4.40 - 5.90 MIL/uL   Hemoglobin 9.4 (L) 13.0 - 18.0 g/dL   HCT 28.5 (L) 40.0 - 52.0 %   MCV 93.2 80.0 - 100.0 fL   MCH 30.8 26.0 - 34.0 pg   MCHC 33.0 32.0 - 36.0 g/dL   RDW 14.7 (H) 11.5 - 14.5 %   Platelets 172 150 - 440 K/uL   Neutrophils Relative % 88 %   Neutro Abs 20.6 (H) 1.4 - 6.5 K/uL   Lymphocytes Relative 4 %   Lymphs Abs 1.0 1.0 - 3.6 K/uL   Monocytes Relative 8 %   Monocytes Absolute 2.0 (H) 0.2 - 1.0 K/uL   Eosinophils Relative 0 %   Eosinophils Absolute 0.0 0 - 0.7 K/uL   Basophils Relative 0 %   Basophils Absolute 0.1 0 - 0.1 K/uL  Comprehensive metabolic panel     Status: Abnormal   Collection Time: 05/28/17  6:20 PM  Result Value Ref Range   Sodium 138 135 - 145 mmol/L   Potassium 3.3 (L) 3.5 - 5.1 mmol/L   Chloride 100 (L) 101 - 111 mmol/L   CO2 28 22 - 32 mmol/L   Glucose, Bld 98 65 - 99 mg/dL   BUN 8 6 - 20 mg/dL    Creatinine, Ser 0.65 0.61 - 1.24 mg/dL   Calcium 8.8 (L) 8.9 - 10.3 mg/dL   Total Protein 6.3 (L) 6.5 - 8.1 g/dL   Albumin 2.7 (L) 3.5 - 5.0 g/dL   AST 23 15 - 41 U/L   ALT 20 17 - 63 U/L   Alkaline Phosphatase 142 (H) 38 - 126 U/L   Total Bilirubin 0.3 0.3 - 1.2 mg/dL   GFR calc non Af Amer >60 >60 mL/min   GFR calc Af Amer >60 >60 mL/min   Anion gap 10 5 - 15  Lactic acid, plasma     Status: Abnormal   Collection Time: 05/28/17  6:20 PM  Result Value Ref Range   Lactic Acid, Venous 3.0 (HH) 0.5 - 1.9 mmol/L  Protime-INR     Status: None   Collection Time: 05/28/17  6:20 PM  Result Value Ref Range   Prothrombin Time 14.2 11.4 - 15.2 seconds   INR 1.11   Blood gas, venous     Status: Abnormal (Preliminary result)   Collection Time: 05/28/17  6:22 PM  Result Value Ref Range   FIO2 PENDING    pH, Ven 7.35 7.250 - 7.430   pCO2, Ven 54 44.0 - 60.0 mmHg   pO2, Ven <31.0 (LL) 32.0 - 45.0 mmHg   Bicarbonate 29.8 (H) 20.0 - 28.0 mmol/L   Acid-Base Excess 3.3 (H) 0.0 - 2.0 mmol/L   Patient temperature 37.0    Collection site VEIN    Sample type VENOUS   Lactic acid, plasma     Status: None   Collection Time: 05/28/17  8:18 PM  Result Value Ref Range   Lactic Acid, Venous 1.2 0.5 - 1.9 mmol/L  Glucose, capillary     Status: Abnormal   Collection Time: 05/28/17  8:32 PM  Result Value Ref Range   Glucose-Capillary 144 (H) 65 - 99 mg/dL    Imaging: Ct Head Wo Contrast  Result Date: 05/28/2017 CLINICAL DATA:  Initial evaluation for acute altered mental status, increased confusion. History of prostate cancer. EXAM: CT HEAD WITHOUT CONTRAST TECHNIQUE: Contiguous axial images were obtained from the base of the skull through the vertex without intravenous contrast. COMPARISON:  Prior CT from 11/30/2016. FINDINGS: Brain: Stable atrophy with chronic small vessel ischemic disease. No acute intracranial hemorrhage. No acute large vessel territory infarct. No mass lesion, midline shift or mass  effect. No hydrocephalus. No extra-axial fluid collection. Vascular: No hyperdense vessel. Scattered vascular calcifications noted within the carotid siphons. Skull: Scalp soft tissues and calvarium within normal limits. Sinuses/Orbits: Globes and orbital soft tissues normal. Paranasal sinuses and mastoid air cells are clear. Other: None. IMPRESSION: 1. No acute intracranial abnormality. 2. Stable atrophy with chronic small vessel ischemic disease. Electronically Signed   By: Jeannine Boga M.D.   On: 05/28/2017 18:21   Dg Chest Portable 1 View  Result Date: 05/28/2017 CLINICAL DATA:  Altered mental status. Prostate cancer and pancreatitis. EXAM: PORTABLE CHEST 1 VIEW COMPARISON:  05/25/2017 FINDINGS: Right Port-A-Cath terminates at the mid SVC. Midline trachea. Normal heart size and mediastinal contours. No pleural effusion or pneumothorax. Remote bilateral rib fractures. IMPRESSION: No acute cardiopulmonary disease. Electronically Signed   By: Abigail Miyamoto M.D.   On: 05/28/2017 17:39    Assessment and Plan: This is a 56 y.o. male who presents with sepsis type picture with likely infected port-a-cath.  I have independently viewed the patient's imaging studies and reviewed his laboratory studies.  Overall, head CT and CXR without acute pathology.  Labs with elevated WBC of 23.7 with left shift, lactic acid 3.0.  Patient has an infected port-a-cath, with palpable fluctuance and with surrounding erythema.  Blood cultures have been obtained in ED.  He will be admitted to medical team.  Will require broad spectrum antibiotics and IV fluid hydration.  Currently his mental status is altered and would recommend against oral intake.  However, if he becomes more responsive, he can eat until midnight.  He should be NPO after midnight and will be taken to OR tomorrow 11/30 for port-a-cath removal.  As he recovers from this septic episode, and remains with negative blood cultures, can have further discussion in  the future about new port-a-cath placement in contralateral side.  Patient's sister and girlfriend understand this plan and all of their questions have been answered.   Melvyn Neth, [place]

## 2017-05-28 NOTE — H&P (Signed)
[place] [facility] [place] NAME: [name]    MR#:  [id number]  DATE OF BIRTH:  Nov 03, 1960  DATE OF ADMISSION:  05/28/2017  PRIMARY CARE PHYSICIAN: [name], MD   REQUESTING/REFERRING PHYSICIAN: Quentin Cornwall, MD  CHIEF COMPLAINT:   Chief Complaint  Patient presents with  . Altered Mental Status    HISTORY OF PRESENT ILLNESS:  [name]  is a 56 y.o. male who presents with lethargy and increased confusion today.  Here in the ED he was noted to meet sepsis criteria with tachycardia, elevated white blood cell count, elevated lactic acid.  The patient recently had port placement, and the port site is erythematous, concerning for infection.  Hospitalist were called for admission and further treatment  PAST MEDICAL HISTORY:   Past Medical History:  Diagnosis Date  . Anemia   . BPH without obstruction/lower urinary tract symptoms   . Cancer ([place])   . Cellulitis of scrotum   . Cyst of prostate 05/13/2015  . Diabetes ([place])   . Difficulty urinating   . Dyspnea   . Epididymoorchitis   . ETOH abuse   . Fracture of right tibia and fibula   . GERD (gastroesophageal reflux disease)   . GI bleed   . Malignant neoplasm of sigmoid colon ([place])   . Pancreatitis   . Trichomoniasis   . Wears dentures    Partial upper and lower.  reports poor fit.    PAST SURGICAL HISTORY:   Past Surgical History:  Procedure Laterality Date  . COLONOSCOPY WITH PROPOFOL N/A 12/02/2016   Procedure: COLONOSCOPY WITH PROPOFOL;  Surgeon: Lucilla Lame, MD;  Location: [facility] ENDOSCOPY;  Service: Endoscopy;  Laterality: N/A;  . ESOPHAGOGASTRODUODENOSCOPY (EGD) WITH PROPOFOL N/A 12/02/2016   Procedure: ESOPHAGOGASTRODUODENOSCOPY (EGD) WITH PROPOFOL;  Surgeon: Lucilla Lame, MD;  Location: [facility] ENDOSCOPY;  Service: Endoscopy;  Laterality: N/A;  . FLEXIBLE SIGMOIDOSCOPY N/A 02/05/2017   Procedure: FLEXIBLE SIGMOIDOSCOPY;  Surgeon: Lucilla Lame, MD;  Location: [facility];  Service: Gastroenterology;  Laterality: N/A;  Needs labs drawn. Needs to come in early.  No anesthesia  . FRACTURE SURGERY     TIBIA AND FIBULA  . FRACTURE SURGERY    . LAPAROSCOPIC PARTIAL COLECTOMY  04/17/2017   [facility]  . PORTACATH PLACEMENT Right 05/18/2017   Procedure: INSERTION PORT-A-CATH;  Surgeon: Vickie Epley, MD;  Location: [facility] ORS;  Service: Vascular;  Laterality: Right;  . resection of pancreas    . SCROTAL EXPLORATION      SOCIAL HISTORY:   Social History   Tobacco Use  . Smoking status: Current Every Day Smoker    Packs/day: 0.25    Years: 41.00    Pack years: 10.25    Types: Cigarettes  . Smokeless tobacco: Never Used  . Tobacco comment: since age 30  Substance Use Topics  . Alcohol use: Yes    Alcohol/week: 0.0 oz    Comment: occasionally - but says not drinkning currently    FAMILY HISTORY:   Family History  Problem Relation Age of Onset  . Cancer - Colon Father   . Colon cancer Brother     DRUG ALLERGIES:   Allergies  Allergen Reactions  . Aspirin Itching and Other (See Comments)    Reaction: abdominal pain    MEDICATIONS AT HOME:   Prior to Admission medications   Medication Sig Start Date End Date Taking? Authorizing Provider  amitriptyline (ELAVIL) 25 MG tablet Take 25 mg by mouth.  02/17/17  Yes [provider]  aspirin EC 81 MG tablet Take 81 mg by mouth. 07/20/15  Yes [provider]  cephALEXin (KEFLEX) 500 MG capsule Take 1 capsule (500 mg total) by mouth 2 (two) times daily for 7 days. 05/26/17 06/02/17 Yes Arta Silence, MD  cyclobenzaprine (FLEXERIL) 10 MG tablet Take 10 mg by mouth 2 (two) times daily. 02/15/16  Yes [provider]  LANTUS SOLOSTAR 100 UNIT/ML Solostar Pen Inject 10 Units into the skin 2 (two) times daily. Patient taking differently: Inject 10 Units into the skin daily at 10 pm.  07/02/16  Yes Harvest Dark, MD  lipase/protease/amylase (CREON) 36000 UNITS CPEP  capsule Take 1 capsule (36,000 Units total) 3 (three) times daily before meals by mouth. 05/05/17  Yes Earlie Server, MD  NOVOLOG FLEXPEN 100 UNIT/ML FlexPen Inject 48 Units into the skin 3 (three) times daily with meals.  05/16/15  Yes [provider]  Vitamins/Minerals TABS Take 1 tablet every morning by mouth.  05/14/15  Yes [provider]  B-D UF III MINI PEN NEEDLES 31G X 5 MM MISC  10/17/16   [provider]  Blood Glucose Monitoring Suppl (GLUCOCOM BLOOD GLUCOSE MONITOR) DEVI use as directed 08/13/15   [provider]  diphenoxylate-atropine (LOMOTIL) 2.5-0.025 MG tablet take 1 tablet four times a day if needed 01/01/17   [provider]  lidocaine-prilocaine (EMLA) cream Apply over porta cath 1-2 hours prior to chemo. 05/26/17   Earlie Server, MD  ondansetron (ZOFRAN) 8 MG tablet Take 1 tablet (8 mg total) by mouth every 8 (eight) hours as needed for nausea or vomiting. 05/26/17   Earlie Server, MD  Oxycodone HCl 10 MG TABS Take 1 tablet (10 mg total) every 6 (six) hours as needed by mouth. 05/18/17   Earlie Server, MD  prochlorperazine (COMPAZINE) 10 MG tablet Take 1 tablet (10 mg total) by mouth every 6 (six) hours as needed for nausea or vomiting. 05/26/17   Earlie Server, MD  RA ACETAMINOPHEN 325 MG tablet take 2 tablets by mouth every 6 hours 04/21/17   [provider]  traZODone (DESYREL) 50 MG tablet take 1 tablet by mouth at bedtime TO HELP WITH SLEEP-PRN 12/05/16   [provider]    REVIEW OF SYSTEMS:  Review of Systems  Unable to perform ROS: Acuity of condition     VITAL SIGNS:   Vitals:   05/28/17 1930 05/28/17 2000 05/28/17 2030 05/28/17 2044  BP: (!) 108/57 125/67 94/68 94/68   Pulse: (!) 102 94 92 88  Resp: (!) 23 19  16   Temp:      TempSrc:      SpO2: 100% 100% 100% 100%  Weight:      Height:       Wt Readings from Last 3 Encounters:  05/28/17 47.6 kg (105 lb)  05/25/17 49.4 kg (109 lb)  05/25/17 49.6 kg (109 lb 7 oz)     PHYSICAL EXAMINATION:  Physical Exam  Vitals reviewed. Constitutional: He is oriented to person, place, and time. He appears well-developed and well-nourished. No distress.  HENT:  Head: Normocephalic and atraumatic.  Mouth/Throat: Oropharynx is clear and moist.  Eyes: Conjunctivae and EOM are normal. Pupils are equal, round, and reactive to light. No scleral icterus.  Neck: Normal range of motion. Neck supple. No JVD present. No thyromegaly present.  Cardiovascular: Normal rate, regular rhythm and intact distal pulses. Exam reveals no gallop and no friction rub.  No murmur heard. Respiratory: Effort normal  and breath sounds normal. No respiratory distress. He has no wheezes. He has no rales.  GI: Soft. Bowel sounds are normal. He exhibits no distension. There is no tenderness.  Musculoskeletal: Normal range of motion. He exhibits no edema.  No arthritis, no gout  Lymphadenopathy:    He has no cervical adenopathy.  Neurological: He is alert and oriented to person, place, and time. No cranial nerve deficit.  No dysarthria, no aphasia  Skin: Skin is warm and dry. No rash noted. There is erythema (Surrounding right chest port site without drainage).  Psychiatric: He has a normal mood and affect. His behavior is normal. Judgment and thought content normal.    LABORATORY PANEL:   CBC Recent Labs  Lab 05/28/17 1820  WBC 23.7*  HGB 9.4*  HCT 28.5*  PLT 172   ------------------------------------------------------------------------------------------------------------------  Chemistries  Recent Labs  Lab 05/28/17 1820  NA 138  K 3.3*  CL 100*  CO2 28  GLUCOSE 98  BUN 8  CREATININE 0.65  CALCIUM 8.8*  AST 23  ALT 20  ALKPHOS 142*  BILITOT 0.3   ------------------------------------------------------------------------------------------------------------------  Cardiac Enzymes Recent Labs  Lab 05/25/17 1336  TROPONINI <0.03    ------------------------------------------------------------------------------------------------------------------  RADIOLOGY:  Ct Head Wo Contrast  Result Date: 05/28/2017 CLINICAL DATA:  Initial evaluation for acute altered mental status, increased confusion. History of prostate cancer. EXAM: CT HEAD WITHOUT CONTRAST TECHNIQUE: Contiguous axial images were obtained from the base of the skull through the vertex without intravenous contrast. COMPARISON:  Prior CT from 11/30/2016. FINDINGS: Brain: Stable atrophy with chronic small vessel ischemic disease. No acute intracranial hemorrhage. No acute large vessel territory infarct. No mass lesion, midline shift or mass effect. No hydrocephalus. No extra-axial fluid collection. Vascular: No hyperdense vessel. Scattered vascular calcifications noted within the carotid siphons. Skull: Scalp soft tissues and calvarium within normal limits. Sinuses/Orbits: Globes and orbital soft tissues normal. Paranasal sinuses and mastoid air cells are clear. Other: None. IMPRESSION: 1. No acute intracranial abnormality. 2. Stable atrophy with chronic small vessel ischemic disease. Electronically Signed   By: Jeannine Boga M.D.   On: 05/28/2017 18:21   Dg Chest Portable 1 View  Result Date: 05/28/2017 CLINICAL DATA:  Altered mental status. Prostate cancer and pancreatitis. EXAM: PORTABLE CHEST 1 VIEW COMPARISON:  05/25/2017 FINDINGS: Right Port-A-Cath terminates at the mid SVC. Midline trachea. Normal heart size and mediastinal contours. No pleural effusion or pneumothorax. Remote bilateral rib fractures. IMPRESSION: No acute cardiopulmonary disease. Electronically Signed   By: Abigail Miyamoto M.D.   On: 05/28/2017 17:39    EKG:   Orders placed or performed during the hospital encounter of 05/28/17  . ED EKG  . ED EKG  . ED EKG  . ED EKG    IMPRESSION AND PLAN:  Principal Problem:   Sepsis ([place]) -IV antibiotics, IV fluids and trend lactate until within  normal limits, blood pressure borderline low to mildly hypotensive, IV fluids as above, cultures sent Active Problems:   Type 2 diabetes mellitus (HCC) -findings scale insulin with corresponding glucose checks   ETOH abuse -CIWA protocol   BPH (benign prostatic hyperplasia) -home meds  All the records are reviewed and case discussed with ED provider. Management plans discussed with the patient and/or family.  DVT PROPHYLAXIS: SubQ lovenox  GI PROPHYLAXIS: None  ADMISSION STATUS: Inpatient  CODE STATUS: Full Code Status History    Date Active Date Inactive Code Status Order ID Comments User Context   12/01/2016 02:45 12/04/2016 16:34 Full Code [id number]  Lance Coon, MD Inpatient   05/12/2015 22:39 05/14/2015 17:23 Full Code [id number]  Lance Coon, MD Inpatient      TOTAL TIME TAKING CARE OF THIS PATIENT: 45 minutes.   James Lafalce Pinnacle 05/28/2017, 9:09 PM  [employer]  [phone]  CC: Primary care physician; [name], MD  Note:  This document was prepared using Dragon voice recognition software and may include unintentional dictation errors.

## 2017-05-28 NOTE — ED Triage Notes (Signed)
Pt presents via EMS. Called for increased confusion and relative hypoglycemia. Pt had recent port-a-cath placement, hx of prostate CA, and DM II. Pt's SO reports usual range of CBG at home is > 600. Pt's CBG per EMS 112. Pt is confused and slow to respind to questions. Pt c/o abdominal pain. Denies n/v/d

## 2017-05-28 NOTE — ED Notes (Signed)
Date and time results received: 05/28/17 1910 (use smartphrase ".now" to insert current time)  Test: Lactic Critical Value: 3.0  Name of Provider Notified: Quentin Cornwall  Orders Received? Or Actions Taken?: no new orders at this time

## 2017-05-28 NOTE — ED Provider Notes (Signed)
[facility] Emergency Department Provider Note    First MD Initiated Contact with Patient 05/28/17 1701     (approximate)  I have reviewed the triage vital signs and the nursing notes.   HISTORY  Chief Complaint Altered Mental Status    HPI ILYAAS Gardner is a 56 y.o. male with below listed chronic medical conditions status post recent right Port-A-Cath placement for history of prostate cancer presents with chief complaint of altered mental status.  Patient is confused and slow to answer questions which is not normal for him.  Family members at bedside and states that he has had decreased oral intake over the past 3 days.  He is becoming increasingly weak and unable to ambulate which is new for him.  No nausea or vomiting.  No measured fevers at home.  No chest pain reported.  No cough.  States that she has noted increasing redness at the Port-A-Cath site but no drainage.  Past Medical History:  Diagnosis Date  . Anemia   . BPH without obstruction/lower urinary tract symptoms   . Cancer ([place])   . Cellulitis of scrotum   . Cyst of prostate 05/13/2015  . Diabetes ([place])   . Difficulty urinating   . Dyspnea   . Epididymoorchitis   . ETOH abuse   . Fracture of right tibia and fibula   . GERD (gastroesophageal reflux disease)   . GI bleed   . Malignant neoplasm of sigmoid colon ([place])   . Pancreatitis   . Trichomoniasis   . Wears dentures    Partial upper and lower.  reports poor fit.   Family History  Problem Relation Age of Onset  . Cancer - Colon Father   . Colon cancer Brother    Past Surgical History:  Procedure Laterality Date  . COLONOSCOPY WITH PROPOFOL N/A 12/02/2016   Procedure: COLONOSCOPY WITH PROPOFOL;  Surgeon: Lucilla Lame, MD;  Location: [facility] ENDOSCOPY;  Service: Endoscopy;  Laterality: N/A;  . ESOPHAGOGASTRODUODENOSCOPY (EGD) WITH PROPOFOL N/A 12/02/2016   Procedure: ESOPHAGOGASTRODUODENOSCOPY (EGD) WITH PROPOFOL;  Surgeon: Lucilla Lame, MD;  Location: [facility] ENDOSCOPY;  Service: Endoscopy;  Laterality: N/A;  . FLEXIBLE SIGMOIDOSCOPY N/A 02/05/2017   Procedure: FLEXIBLE SIGMOIDOSCOPY;  Surgeon: Lucilla Lame, MD;  Location: [place];  Service: Gastroenterology;  Laterality: N/A;  Needs labs drawn. Needs to come in early.  No anesthesia  . FRACTURE SURGERY     TIBIA AND FIBULA  . FRACTURE SURGERY    . LAPAROSCOPIC PARTIAL COLECTOMY  04/17/2017   [facility]  . PORTACATH PLACEMENT Right 05/18/2017   Procedure: INSERTION PORT-A-CATH;  Surgeon: Vickie Epley, MD;  Location: [facility] ORS;  Service: Vascular;  Laterality: Right;  . resection of pancreas    . SCROTAL EXPLORATION     Patient Active Problem List   Diagnosis Date Noted  . Sepsis ([place]) 05/28/2017  . Local infection due to central venous catheter   . Tobacco use disorder 04/21/2017  . Malignant neoplasm of rectum ([place]) 04/16/2017  . Moderate malnutrition ([place]) 02/12/2017  . Pancreatic insufficiency 02/12/2017  . Chronic abdominal pain 01/29/2017  . Malignant neoplasm of sigmoid colon ([place]) 01/15/2017  . Shock ([place]) 12/02/2016  . Acute posthemorrhagic anemia   . Benign neoplasm of ascending colon   . Colorectal polyps   . Noninfectious diarrhea   . Syncope 12/01/2016  . Chronic diarrhea 12/01/2016  . Pancreatitis 04/17/2016  . Noncompliance 07/05/2015  . Tobacco dependence 07/05/2015  . Fracture of right tibia and  fibula 06/05/2015  . Type 2 diabetes mellitus with hyperglycemia ([place]) 06/05/2015  . Prostatic cyst 05/13/2015  . GI bleed 05/12/2015  . Type 2 diabetes mellitus ([place]) 05/12/2015  . GERD (gastroesophageal reflux disease) 05/12/2015  . ETOH abuse 05/12/2015  . BPH (benign prostatic hyperplasia) 05/12/2015      Prior to Admission medications   Medication Sig Start Date End Date Taking? Authorizing Provider  amitriptyline (ELAVIL) 25 MG tablet Take 25 mg by mouth. 02/17/17  Yes [provider]  aspirin EC 81 MG tablet Take 81  mg by mouth. 07/20/15  Yes [provider]  cephALEXin (KEFLEX) 500 MG capsule Take 1 capsule (500 mg total) by mouth 2 (two) times daily for 7 days. 05/26/17 06/02/17 Yes Arta Silence, MD  cyclobenzaprine (FLEXERIL) 10 MG tablet Take 10 mg by mouth 2 (two) times daily. 02/15/16  Yes [provider]  LANTUS SOLOSTAR 100 UNIT/ML Solostar Pen Inject 10 Units into the skin 2 (two) times daily. Patient taking differently: Inject 10 Units into the skin daily at 10 pm.  07/02/16  Yes Harvest Dark, MD  lipase/protease/amylase (CREON) 36000 UNITS CPEP capsule Take 1 capsule (36,000 Units total) 3 (three) times daily before meals by mouth. 05/05/17  Yes Earlie Server, MD  NOVOLOG FLEXPEN 100 UNIT/ML FlexPen Inject 48 Units into the skin 3 (three) times daily with meals.  05/16/15  Yes [provider]  Vitamins/Minerals TABS Take 1 tablet every morning by mouth.  05/14/15  Yes [provider]  B-D UF III MINI PEN NEEDLES 31G X 5 MM MISC  10/17/16   [provider]  Blood Glucose Monitoring Suppl (GLUCOCOM BLOOD GLUCOSE MONITOR) DEVI use as directed 08/13/15   [provider]  diphenoxylate-atropine (LOMOTIL) 2.5-0.025 MG tablet take 1 tablet four times a day if needed 01/01/17   [provider]  lidocaine-prilocaine (EMLA) cream Apply over porta cath 1-2 hours prior to chemo. 05/26/17   Earlie Server, MD  ondansetron (ZOFRAN) 8 MG tablet Take 1 tablet (8 mg total) by mouth every 8 (eight) hours as needed for nausea or vomiting. 05/26/17   Earlie Server, MD  Oxycodone HCl 10 MG TABS Take 1 tablet (10 mg total) every 6 (six) hours as needed by mouth. 05/18/17   Earlie Server, MD  prochlorperazine (COMPAZINE) 10 MG tablet Take 1 tablet (10 mg total) by mouth every 6 (six) hours as needed for nausea or vomiting. 05/26/17   Earlie Server, MD  RA ACETAMINOPHEN 325 MG tablet take 2 tablets by mouth every 6 hours 04/21/17   [provider]  traZODone (DESYREL) 50 MG  tablet take 1 tablet by mouth at bedtime TO HELP WITH SLEEP-PRN 12/05/16   [provider]    Allergies Aspirin    Social History Social History   Tobacco Use  . Smoking status: Current Every Day Smoker    Packs/day: 0.25    Years: 41.00    Pack years: 10.25    Types: Cigarettes  . Smokeless tobacco: Never Used  . Tobacco comment: since age 40  Substance Use Topics  . Alcohol use: Yes    Alcohol/week: 0.0 oz    Comment: occasionally - but says not drinkning currently  . Drug use: No    Review of Systems Patient denies headaches, rhinorrhea, blurry vision, numbness, shortness of breath, chest pain, edema, cough, abdominal pain, nausea, vomiting, diarrhea, dysuria, fevers, rashes or hallucinations unless otherwise stated above in HPI. ____________________________________________   PHYSICAL EXAM:  VITAL SIGNS: Vitals:  05/28/17 2200 05/28/17 2222  BP: 107/83 (!) 101/48  Pulse: 88 90  Resp:  18  Temp:  98.9 F (37.2 C)  SpO2: 100% 100%    Constitutional: Alert, cachectic and chronically ill appearing but in no acute distress. Eyes: Conjunctivae are normal.  Head: Atraumatic. Nose: No congestion/rhinnorhea. Mouth/Throat: Mucous membranes are moist.   Neck: No stridor. Painless ROM.  Cardiovascular: mildly tachycardic rate, regular rhythm. Grossly normal heart sounds.  Good peripheral circulation. Respiratory: Normal respiratory effort.  No retractions. Lungs CTAB. Gastrointestinal: Soft and nontender. No distention. No abdominal bruits. No CVA tenderness. Musculoskeletal: Right Port-A-Cath does have some warmth and erythema but no streaking.  No purulent drainage.  No fluctuance to suggest abscess.  No lower extremity tenderness nor edema.  No joint effusions. Neurologic:   No gross focal neurologic deficits are appreciated. No facial droop Skin:  Skin is warm, dry and intact. No rash noted. Psychiatric: Mood and affect are normal. Speech and behavior are  normal.  ____________________________________________   LABS (all labs ordered are listed, but only abnormal results are displayed)  Results for orders placed or performed during the hospital encounter of 05/28/17 (from the past 24 hour(s))  Glucose, capillary     Status: Abnormal   Collection Time: 05/28/17  5:08 PM  Result Value Ref Range   Glucose-Capillary 112 (H) 65 - 99 mg/dL  CBC with Differential/Platelet     Status: Abnormal   Collection Time: 05/28/17  6:20 PM  Result Value Ref Range   WBC 23.7 (H) 3.8 - 10.6 K/uL   RBC 3.06 (L) 4.40 - 5.90 MIL/uL   Hemoglobin 9.4 (L) 13.0 - 18.0 g/dL   HCT 28.5 (L) 40.0 - 52.0 %   MCV 93.2 80.0 - 100.0 fL   MCH 30.8 26.0 - 34.0 pg   MCHC 33.0 32.0 - 36.0 g/dL   RDW 14.7 (H) 11.5 - 14.5 %   Platelets 172 150 - 440 K/uL   Neutrophils Relative % 88 %   Neutro Abs 20.6 (H) 1.4 - 6.5 K/uL   Lymphocytes Relative 4 %   Lymphs Abs 1.0 1.0 - 3.6 K/uL   Monocytes Relative 8 %   Monocytes Absolute 2.0 (H) 0.2 - 1.0 K/uL   Eosinophils Relative 0 %   Eosinophils Absolute 0.0 0 - 0.7 K/uL   Basophils Relative 0 %   Basophils Absolute 0.1 0 - 0.1 K/uL  Comprehensive metabolic panel     Status: Abnormal   Collection Time: 05/28/17  6:20 PM  Result Value Ref Range   Sodium 138 135 - 145 mmol/L   Potassium 3.3 (L) 3.5 - 5.1 mmol/L   Chloride 100 (L) 101 - 111 mmol/L   CO2 28 22 - 32 mmol/L   Glucose, Bld 98 65 - 99 mg/dL   BUN 8 6 - 20 mg/dL   Creatinine, Ser 0.65 0.61 - 1.24 mg/dL   Calcium 8.8 (L) 8.9 - 10.3 mg/dL   Total Protein 6.3 (L) 6.5 - 8.1 g/dL   Albumin 2.7 (L) 3.5 - 5.0 g/dL   AST 23 15 - 41 U/L   ALT 20 17 - 63 U/L   Alkaline Phosphatase 142 (H) 38 - 126 U/L   Total Bilirubin 0.3 0.3 - 1.2 mg/dL   GFR calc non Af Amer >60 >60 mL/min   GFR calc Af Amer >60 >60 mL/min   Anion gap 10 5 - 15  Troponin I     Status: Abnormal   Collection Time: 05/28/17  6:20 PM  Result Value Ref Range   Troponin I 0.07 (HH) <0.03 ng/mL    Lactic acid, plasma     Status: Abnormal   Collection Time: 05/28/17  6:20 PM  Result Value Ref Range   Lactic Acid, Venous 3.0 (HH) 0.5 - 1.9 mmol/L  Protime-INR     Status: None   Collection Time: 05/28/17  6:20 PM  Result Value Ref Range   Prothrombin Time 14.2 11.4 - 15.2 seconds   INR 1.11   Blood gas, venous     Status: Abnormal (Preliminary result)   Collection Time: 05/28/17  6:22 PM  Result Value Ref Range   FIO2 PENDING    pH, Ven 7.35 7.250 - 7.430   pCO2, Ven 54 44.0 - 60.0 mmHg   pO2, Ven <31.0 (LL) 32.0 - 45.0 mmHg   Bicarbonate 29.8 (H) 20.0 - 28.0 mmol/L   Acid-Base Excess 3.3 (H) 0.0 - 2.0 mmol/L   Patient temperature 37.0    Collection site VEIN    Sample type VENOUS   Lactic acid, plasma     Status: None   Collection Time: 05/28/17  8:18 PM  Result Value Ref Range   Lactic Acid, Venous 1.2 0.5 - 1.9 mmol/L  Glucose, capillary     Status: Abnormal   Collection Time: 05/28/17  8:32 PM  Result Value Ref Range   Glucose-Capillary 144 (H) 65 - 99 mg/dL   ____________________________________________  EKG My review and personal interpretation at Time: 17:17   Indication: sepsis  Rate: 100  Rhythm: sinus Axis: normal Other: no stemi, normal intervals ____________________________________________  RADIOLOGY  I personally reviewed all radiographic images ordered to evaluate for the above acute complaints and reviewed radiology reports and findings.  These findings were personally discussed with the patient.  Please see medical record for radiology report.  ____________________________________________   PROCEDURES  Procedure(s) performed:  .Critical Care Performed by: Merlyn Lot, MD Authorized by: Merlyn Lot, MD   Critical care provider statement:    Critical care time (minutes):  30   Critical care time was exclusive of:  Separately billable procedures and treating other patients   Critical care was necessary to treat or prevent imminent  or life-threatening deterioration of the following conditions:  Sepsis   Critical care was time spent personally by me on the following activities:  Development of treatment plan with patient or surrogate, discussions with consultants, evaluation of patient's response to treatment, examination of patient, obtaining history from patient or surrogate, ordering and performing treatments and interventions, ordering and review of laboratory studies, ordering and review of radiographic studies, pulse oximetry, re-evaluation of patient's condition and review of old charts       Critical Care performed: yes  ____________________________________________   INITIAL IMPRESSION / [place] / ED COURSE  Pertinent labs & imaging results that were available during my care of the patient were reviewed by me and considered in my medical decision making (see chart for details).  DDX: Dehydration, sepsis, pna, uti, hypoglycemia, cva, drug effect, withdrawal, encephalitis   [name] is a 56 y.o. who presents to the ED with altered mental status as described above.  Patient is afebrile but mildly tachycardic and it is tachypneic.  His abdominal exam is soft and benign.  Will order CT scan based on his history of cancer with altered mental status.  Glucose is normal.  Does appear cachectic therefore will give IV fluids.  Patient clinically appears septic therefore will obtain blood  cultures.  I am concern for line infection patient also has multiple other possible sources of infection.  The patient will be placed on continuous pulse oximetry and telemetry for monitoring.  Laboratory evaluation will be sent to evaluate for the above complaints.     Clinical Course as of May 28 2333  Thu May 28, 2017  1910 Patient with marketed white count.  Repeat abdominal exam is soft and benign.  This on his exam I am concern for a line infection given the cellulitis around the Port-A-Cath.  Will give  broad-spectrum antibiotics.  Spoke with general surgery regarding his presentation they agreed to evaluate the patient at bedside.  [PR]    Clinical Course User Index [PR] Merlyn Lot, MD   Patient will be continued on broad-spectrum antibiotics and further resuscitated with IV fluids for concern for sepsis.  Have discussed with the patient and available family all diagnostics and treatments performed thus far and all questions were answered to the best of my ability. The patient demonstrates understanding and agreement with plan.    ____________________________________________   FINAL CLINICAL IMPRESSION(S) / ED DIAGNOSES  Final diagnoses:  Sepsis, due to unspecified organism ([place])  Acute encephalopathy      NEW MEDICATIONS STARTED DURING THIS VISIT:  This SmartLink is deprecated. Use AVSMEDLIST instead to display the medication list for a patient.   Note:  This document was prepared using Dragon voice recognition software and may include unintentional dictation errors.    Merlyn Lot, MD 05/28/17 [phone]

## 2017-05-29 ENCOUNTER — Inpatient Hospital Stay: Payer: Medicaid Other | Admitting: Anesthesiology

## 2017-05-29 ENCOUNTER — Other Ambulatory Visit: Payer: Self-pay

## 2017-05-29 ENCOUNTER — Encounter
Admission: EM | Disposition: A | Discharge status: Home Health | Payer: Self-pay | Source: Home / Self Care | Attending: Internal Medicine

## 2017-05-29 DIAGNOSIS — A419 Sepsis, unspecified organism: Secondary | ICD-10-CM

## 2017-05-29 DIAGNOSIS — F1010 Alcohol abuse, uncomplicated: Secondary | ICD-10-CM

## 2017-05-29 DIAGNOSIS — C20 Malignant neoplasm of rectum: Secondary | ICD-10-CM

## 2017-05-29 DIAGNOSIS — T80212D Local infection due to central venous catheter, subsequent encounter: Secondary | ICD-10-CM

## 2017-05-29 HISTORY — PX: PORT-A-CATH REMOVAL: SHX5289

## 2017-05-29 LAB — BLOOD CULTURE ID PANEL (REFLEXED)
Acinetobacter baumannii: NOT DETECTED
CANDIDA GLABRATA: NOT DETECTED
CANDIDA KRUSEI: NOT DETECTED
CANDIDA PARAPSILOSIS: NOT DETECTED
CANDIDA TROPICALIS: NOT DETECTED
Candida albicans: NOT DETECTED
ENTEROBACTER CLOACAE COMPLEX: NOT DETECTED
ESCHERICHIA COLI: NOT DETECTED
Enterobacteriaceae species: NOT DETECTED
Enterococcus species: NOT DETECTED
Haemophilus influenzae: NOT DETECTED
KLEBSIELLA OXYTOCA: NOT DETECTED
KLEBSIELLA PNEUMONIAE: NOT DETECTED
Listeria monocytogenes: NOT DETECTED
Methicillin resistance: DETECTED — AB
Neisseria meningitidis: NOT DETECTED
PROTEUS SPECIES: NOT DETECTED
Pseudomonas aeruginosa: NOT DETECTED
SERRATIA MARCESCENS: NOT DETECTED
STAPHYLOCOCCUS AUREUS BCID: DETECTED — AB
STAPHYLOCOCCUS SPECIES: DETECTED — AB
STREPTOCOCCUS AGALACTIAE: NOT DETECTED
Streptococcus pneumoniae: NOT DETECTED
Streptococcus pyogenes: NOT DETECTED
Streptococcus species: NOT DETECTED

## 2017-05-29 LAB — CBC
HCT: 24.5 % — ABNORMAL LOW (ref 40.0–52.0)
HCT: 27 % — ABNORMAL LOW (ref 40.0–52.0)
Hemoglobin: 8.1 g/dL — ABNORMAL LOW (ref 13.0–18.0)
Hemoglobin: 9.1 g/dL — ABNORMAL LOW (ref 13.0–18.0)
MCH: 30.9 pg (ref 26.0–34.0)
MCH: 31.6 pg (ref 26.0–34.0)
MCHC: 33 g/dL (ref 32.0–36.0)
MCHC: 33.8 g/dL (ref 32.0–36.0)
MCV: 93.6 fL (ref 80.0–100.0)
MCV: 93.5 fL (ref 80.0–100.0)
PLATELETS: 156 10*3/uL (ref 150–440)
PLATELETS: 166 10*3/uL (ref 150–440)
RBC: 2.61 MIL/uL — ABNORMAL LOW (ref 4.40–5.90)
RBC: 2.89 MIL/uL — AB (ref 4.40–5.90)
RDW: 14.8 % — ABNORMAL HIGH (ref 11.5–14.5)
RDW: 14.9 % — AB (ref 11.5–14.5)
WBC: 18.3 10*3/uL — ABNORMAL HIGH (ref 3.8–10.6)
WBC: 14.6 10*3/uL — AB (ref 3.8–10.6)

## 2017-05-29 LAB — GLUCOSE, CAPILLARY
GLUCOSE-CAPILLARY: 107 mg/dL — AB (ref 65–99)
GLUCOSE-CAPILLARY: 115 mg/dL — AB (ref 65–99)
GLUCOSE-CAPILLARY: 121 mg/dL — AB (ref 65–99)
Glucose-Capillary: 134 mg/dL — ABNORMAL HIGH (ref 65–99)
Glucose-Capillary: 231 mg/dL — ABNORMAL HIGH (ref 65–99)
Glucose-Capillary: 73 mg/dL (ref 65–99)

## 2017-05-29 LAB — COMPREHENSIVE METABOLIC PANEL
ALK PHOS: 154 U/L — AB (ref 38–126)
ALT: 16 U/L — AB (ref 17–63)
ALT: 20 U/L (ref 17–63)
ANION GAP: 5 (ref 5–15)
AST: 23 U/L (ref 15–41)
AST: 31 U/L (ref 15–41)
Albumin: 2.1 g/dL — ABNORMAL LOW (ref 3.5–5.0)
Albumin: 2.2 g/dL — ABNORMAL LOW (ref 3.5–5.0)
Alkaline Phosphatase: 125 U/L (ref 38–126)
Anion gap: 7 (ref 5–15)
BILIRUBIN TOTAL: 0.5 mg/dL (ref 0.3–1.2)
BUN: 7 mg/dL (ref 6–20)
BUN: 6 mg/dL (ref 6–20)
CALCIUM: 7.6 mg/dL — AB (ref 8.9–10.3)
CHLORIDE: 107 mmol/L (ref 101–111)
CO2: 25 mmol/L (ref 22–32)
CO2: 25 mmol/L (ref 22–32)
CREATININE: 0.56 mg/dL — AB (ref 0.61–1.24)
CREATININE: 0.53 mg/dL — AB (ref 0.61–1.24)
Calcium: 7.9 mg/dL — ABNORMAL LOW (ref 8.9–10.3)
Chloride: 110 mmol/L (ref 101–111)
GFR calc Af Amer: >60 mL/min (ref >60)
GFR calc non Af Amer: >60 mL/min (ref >60)
GLUCOSE: 123 mg/dL — AB (ref 65–99)
Glucose, Bld: 106 mg/dL — ABNORMAL HIGH (ref 65–99)
Potassium: 2.7 mmol/L — CL (ref 3.5–5.1)
Potassium: 2.9 mmol/L — ABNORMAL LOW (ref 3.5–5.1)
SODIUM: 139 mmol/L (ref 135–145)
Sodium: 140 mmol/L (ref 135–145)
TOTAL PROTEIN: 5.7 g/dL — AB (ref 6.5–8.1)
Total Bilirubin: 0.5 mg/dL (ref 0.3–1.2)
Total Protein: 5.2 g/dL — ABNORMAL LOW (ref 6.5–8.1)

## 2017-05-29 LAB — BLOOD GAS, VENOUS
Acid-Base Excess: 3.3 mmol/L — ABNORMAL HIGH (ref 0.0–2.0)
BICARBONATE: 29.8 mmol/L — AB (ref 20.0–28.0)
PH VEN: 7.35 (ref 7.250–7.430)
Patient temperature: 37
pCO2, Ven: 54 mmHg (ref 44.0–60.0)
pO2, Ven: <31 mmHg — CL (ref 32.0–45.0)

## 2017-05-29 LAB — MAGNESIUM: Magnesium: 1.7 mg/dL (ref 1.7–2.4)

## 2017-05-29 LAB — PHOSPHORUS: Phosphorus: 3.2 mg/dL (ref 2.5–4.6)

## 2017-05-29 LAB — SURGICAL PCR SCREEN
MRSA, PCR: POSITIVE — AB
Staphylococcus aureus: POSITIVE — AB

## 2017-05-29 LAB — LACTIC ACID, PLASMA: Lactic Acid, Venous: 0.9 mmol/L (ref 0.5–1.9)

## 2017-05-29 LAB — PROCALCITONIN: PROCALCITONIN: 1.31 ng/mL

## 2017-05-29 SURGERY — REMOVAL PORT-A-CATH
Anesthesia: General | Wound class: Dirty or Infected

## 2017-05-29 MED ORDER — MEPERIDINE HCL 50 MG/ML IJ SOLN: 6.2500 mg | INTRAMUSCULAR | Status: DC | PRN

## 2017-05-29 MED ORDER — FENTANYL CITRATE (PF) 100 MCG/2ML IJ SOLN
INTRAMUSCULAR | Status: AC
  Filled 2017-05-29: qty 2

## 2017-05-29 MED ORDER — BUPIVACAINE HCL (PF) 0.25 % IJ SOLN
INTRAMUSCULAR | Status: AC
  Filled 2017-05-29: qty 10

## 2017-05-29 MED ORDER — MIDAZOLAM HCL 2 MG/2ML IJ SOLN
INTRAMUSCULAR | Status: DC | PRN
  Administered 2017-05-29: .5 mg via INTRAVENOUS

## 2017-05-29 MED ORDER — CHLORHEXIDINE GLUCONATE CLOTH 2 % EX PADS: 6.0000 | MEDICATED_PAD | Freq: Once | CUTANEOUS | Status: DC

## 2017-05-29 MED ORDER — BUPIVACAINE HCL (PF) 0.25 % IJ SOLN
INTRAMUSCULAR | Status: AC
  Filled 2017-05-29: qty 20

## 2017-05-29 MED ORDER — PHENYLEPHRINE HCL 10 MG/ML IJ SOLN
INTRAMUSCULAR | Status: DC | PRN
  Administered 2017-05-29 (×2): 100 ug via INTRAVENOUS
  Administered 2017-05-29: 200 ug via INTRAVENOUS

## 2017-05-29 MED ORDER — SODIUM CHLORIDE 0.9 % IV BOLUS (SEPSIS)
500.0000 mL | Freq: Once | INTRAVENOUS | Status: AC
  Administered 2017-05-29: 500 mL via INTRAVENOUS

## 2017-05-29 MED ORDER — MAGNESIUM SULFATE IN D5W 1-5 GM/100ML-% IV SOLN
1.0000 g | Freq: Once | INTRAVENOUS | Status: AC
  Administered 2017-05-29: 1 g via INTRAVENOUS
  Filled 2017-05-29: qty 100

## 2017-05-29 MED ORDER — POTASSIUM CHLORIDE 10 MEQ/100ML IV SOLN
10.0000 meq | INTRAVENOUS | Status: AC
  Administered 2017-05-29: 10 meq via INTRAVENOUS
  Filled 2017-05-29 (×2): qty 100

## 2017-05-29 MED ORDER — OXYCODONE HCL 5 MG PO TABS: 5.0000 mg | ORAL_TABLET | Freq: Once | ORAL | Status: DC | PRN

## 2017-05-29 MED ORDER — MAGNESIUM SULFATE 2 GM/50ML IV SOLN
2.0000 g | INTRAVENOUS | Status: AC
  Administered 2017-05-29: 2 g via INTRAVENOUS
  Filled 2017-05-29: qty 50

## 2017-05-29 MED ORDER — BUPIVACAINE HCL 0.25 % IJ SOLN
INTRAMUSCULAR | Status: DC | PRN
  Administered 2017-05-29: 1 mL

## 2017-05-29 MED ORDER — LIDOCAINE HCL 1 % IJ SOLN
INTRAMUSCULAR | Status: DC | PRN
  Administered 2017-05-29: 1 mL

## 2017-05-29 MED ORDER — PANTOPRAZOLE SODIUM 40 MG IV SOLR
40.0000 mg | INTRAVENOUS | Status: DC
  Administered 2017-05-29 – 2017-06-03 (×6): 40 mg via INTRAVENOUS
  Filled 2017-05-29 (×6): qty 40

## 2017-05-29 MED ORDER — PROPOFOL 500 MG/50ML IV EMUL
INTRAVENOUS | Status: DC | PRN
  Administered 2017-05-29: 40 ug/kg/min via INTRAVENOUS

## 2017-05-29 MED ORDER — OXYCODONE HCL 5 MG/5ML PO SOLN: 5.0000 mg | Freq: Once | ORAL | Status: DC | PRN

## 2017-05-29 MED ORDER — PROPOFOL 500 MG/50ML IV EMUL
INTRAVENOUS | Status: AC
  Filled 2017-05-29: qty 50

## 2017-05-29 MED ORDER — FENTANYL CITRATE (PF) 100 MCG/2ML IJ SOLN
INTRAMUSCULAR | Status: DC | PRN
  Administered 2017-05-29 (×2): 25 ug via INTRAVENOUS

## 2017-05-29 MED ORDER — FENTANYL CITRATE (PF) 100 MCG/2ML IJ SOLN: 25.0000 ug | INTRAMUSCULAR | Status: DC | PRN

## 2017-05-29 MED ORDER — POTASSIUM CHLORIDE 10 MEQ/100ML IV SOLN
10.0000 meq | INTRAVENOUS | Status: AC
  Administered 2017-05-29 – 2017-05-30 (×6): 10 meq via INTRAVENOUS
  Filled 2017-05-29 (×6): qty 100

## 2017-05-29 MED ORDER — PROMETHAZINE HCL 25 MG/ML IJ SOLN: 6.2500 mg | INTRAMUSCULAR | Status: DC | PRN

## 2017-05-29 MED ORDER — SODIUM CHLORIDE 0.9 % IV BOLUS (SEPSIS)
1000.0000 mL | Freq: Once | INTRAVENOUS | Status: AC
  Administered 2017-05-29: 1000 mL via INTRAVENOUS

## 2017-05-29 MED ORDER — POTASSIUM CHLORIDE IN NACL 20-0.9 MEQ/L-% IV SOLN
INTRAVENOUS | Status: DC
  Administered 2017-05-29 (×2): via INTRAVENOUS
  Filled 2017-05-29 (×3): qty 1000

## 2017-05-29 MED ORDER — SODIUM CHLORIDE 0.9 % IV SOLN
INTRAVENOUS | Status: DC | PRN
  Administered 2017-05-29: 13:00:00 via INTRAVENOUS

## 2017-05-29 MED ORDER — MIDAZOLAM HCL 2 MG/2ML IJ SOLN
INTRAMUSCULAR | Status: AC
  Filled 2017-05-29: qty 2

## 2017-05-29 MED ORDER — LIDOCAINE HCL (PF) 1 % IJ SOLN
INTRAMUSCULAR | Status: AC
  Filled 2017-05-29: qty 30

## 2017-05-29 MED ORDER — PROPOFOL 10 MG/ML IV BOLUS
INTRAVENOUS | Status: AC
  Filled 2017-05-29: qty 20

## 2017-05-29 SURGICAL SUPPLY — 28 items
ADH SKN CLS APL DERMABOND .7 (GAUZE/BANDAGES/DRESSINGS) ×1
BAG BIOHAZARD 6X9 CLR ZIPLOCK (MISCELLANEOUS) ×3 IMPLANT
BAG SPEC THK2 9X6 BHZR CLR (MISCELLANEOUS) ×1
BLADE SURG 15 STRL LF DISP TIS (BLADE) ×1 IMPLANT
BLADE SURG 15 STRL SS (BLADE) ×3
CANISTER SUCT 1200ML W/VALVE (MISCELLANEOUS) ×3 IMPLANT
CHLORAPREP W/TINT 26ML (MISCELLANEOUS) ×3 IMPLANT
DECANTER SPIKE VIAL GLASS SM (MISCELLANEOUS) ×6 IMPLANT
DERMABOND ADVANCED (GAUZE/BANDAGES/DRESSINGS) ×2
DERMABOND ADVANCED .7 DNX12 (GAUZE/BANDAGES/DRESSINGS) ×1 IMPLANT
DRAPE LAPAROTOMY 77X122 PED (DRAPES) ×3 IMPLANT
ELECT REM PT RETURN 9FT ADLT (ELECTROSURGICAL) ×3
ELECTRODE REM PT RTRN 9FT ADLT (ELECTROSURGICAL) ×1 IMPLANT
GAUZE SPONGE 4X4 12PLY STRL (GAUZE/BANDAGES/DRESSINGS) ×2 IMPLANT
GLOVE BIO SURGEON STRL SZ7 (GLOVE) ×3 IMPLANT
GLOVE BIOGEL PI IND STRL 7.5 (GLOVE) ×1 IMPLANT
GLOVE BIOGEL PI INDICATOR 7.5 (GLOVE) ×2
GOWN STRL REUS W/ TWL LRG LVL3 (GOWN DISPOSABLE) ×2 IMPLANT
GOWN STRL REUS W/TWL LRG LVL3 (GOWN DISPOSABLE) ×6
KIT RM TURNOVER STRD PROC AR (KITS) ×3 IMPLANT
NDL HYPO 25X1 1.5 SAFETY (NEEDLE) ×1 IMPLANT
NEEDLE HYPO 25X1 1.5 SAFETY (NEEDLE) ×3 IMPLANT
PACK BASIN MINOR ARMC (MISCELLANEOUS) ×3 IMPLANT
SUT MNCRL AB 4-0 PS2 18 (SUTURE) ×3 IMPLANT
SUT VIC AB 3-0 SH 27 (SUTURE) ×3
SUT VIC AB 3-0 SH 27X BRD (SUTURE) ×1 IMPLANT
SWAB DUAL CULTURE TRANS RED ST (MISCELLANEOUS) ×2 IMPLANT
SYR 10ML LL (SYRINGE) ×3 IMPLANT

## 2017-05-29 NOTE — Op Note (Signed)
SURGICAL OPERATIVE REPORT  DATE:   ATTENDING Surgeon(s): Vickie Epley, MD  ANESTHESIA: Local   PRE-OPERATIVE DIAGNOSIS: Infected Right IJ central venous catheter with subcutaneous port inserted for Stage 3 rectal cancer (icd-10: T80.212, C20)  POST-OPERATIVE DIAGNOSIS: Infected Right IJ central venous catheter with subcutaneous port inserted for Stage 3 rectal cancer (icd-10: T80.212, C20)  PROCEDURE(S):  1.) Removal of infected Right IJ central venous catheter with subcutaneous port (cpt: 36590)  INTRAOPERATIVE FINDINGS: Infected Right IJ central venous catheter with subcutaneous port and surrounding purulence and erythema, removed intact  INTRAVENOUS FLUIDS: 200 mL crystalloid   ESTIMATED BLOOD LOSS: Minimal (< 10 mL)  URINE OUTPUT: No foley   SPECIMENS: Aerobic and anaerobic wound cavity culture  IMPLANTS: None  DRAINS: none  COMPLICATIONS: None apparent  CONDITION AT END OF PROCEDURE: Hemodynamically stable and awake  DISPOSITION OF PATIENT: Recovery  INDICATIONS FOR PROCEDURE:  56 year old male s/p laparoscopic LAR for stage 3 rectal adenocarcinoma presented with altered mental status and sepsis attributed to infection of his Right central venous catheter 11 days after it was placed. Patient has long struggled with alcohol abuse, chronic pancreatitis, and brittle poorly controlled DM with baseline blood glucoses ~800's s/p distal pancreatectomy also for chronic alcohol-induced pancreatitis. Patient's long-time girlfriend reports telling him repeatedly to stop picking at his wound s/p placement of Right IJ central venous catheter with subcutaneous port, but patient says it hurt and itched, prompting him to pick at the wound as was witnessed by his girlfriend and child. 3 days prior to current admission, patient was noted to have erythema surrounding his port during presentation for hyperglycemia, and he was prescribed antibiotics, but did not/was not able to fill the  prescription. All risks, benefits, and alternatives to port removal were discussed with the patient and his long-time girlfried, all of patient's questions were answered to his expressed satisfaction, and informed consent was obtained and documented.  DETAILS OF PROCEDURE: Patient was brought to the operating suite and appropriately identified. In supine position, operative site was prepped and draped in the usual sterile fashion. Following a brief time out, mixed lidocaine and marcaine were injected in the subcutaneous tissue over the port, a 2 cm transverse linear incision was made and extended to 2.5 cm to accommodate the port. Gentle blunt dissection was used to free the port and attached catheter, which were together removed intact and without difficulty, and immediate focal pressure was applied to the venous insertion site for hemostasis. A culture specimen from the port-site pocket was obtained. Hemostasis was confirmed, and subcutaneous pocket was copiously irrigated with warm saline. Skin was then cleaned, dried, and the wound was packed with a single dry gauze sponge, over which a second dry gauze sponge was placed and secured using paper adhesive tape.  Patient was then safely able to be transferred to recovery for post-operative monitoring and care.  I was present for all aspects of the above procedure, and there were no complications apparent.

## 2017-05-29 NOTE — Anesthesia Preprocedure Evaluation (Addendum)
Anesthesia Evaluation  Patient identified by MRN, date of birth, ID band Patient awake    Reviewed: Allergy & Precautions, NPO status , Patient's Chart, lab work & pertinent test results  History of Anesthesia Complications Negative for: history of anesthetic complications  Airway Mallampati: III  TM Distance: >3 FB Neck ROM: Full    Dental  (+) Poor Dentition, Missing   Pulmonary neg sleep apnea, neg COPD, Current Smoker,    breath sounds clear to auscultation- rhonchi (-) wheezing      Cardiovascular (-) hypertension(-) CAD, (-) Past MI and (-) Cardiac Stents  Rhythm:Regular Rate:Normal - Systolic murmurs and - Diastolic murmurs    Neuro/Psych negative neurological ROS     GI/Hepatic Neg liver ROS, GERD  ,  Endo/Other  diabetes, Insulin Dependent  Renal/GU negative Renal ROS     Musculoskeletal   Abdominal (+) - obese,   Peds  Hematology  (+) anemia ,   Anesthesia Other Findings Past Medical History: No date: Anemia No date: BPH without obstruction/lower urinary tract symptoms No date: Cancer ([facility]) No date: Cellulitis of scrotum 05/13/2015: Cyst of prostate No date: Diabetes ([place]) No date: Difficulty urinating No date: Dyspnea No date: Epididymoorchitis No date: ETOH abuse No date: Fracture of right tibia and fibula No date: GERD (gastroesophageal reflux disease) No date: GI bleed No date: Malignant neoplasm of sigmoid colon ([facility]) No date: Pancreatitis No date: Trichomoniasis No date: Wears dentures     Comment:  Partial upper and lower.  reports poor fit.   Reproductive/Obstetrics                             Anesthesia Physical Anesthesia Plan  ASA: III  Anesthesia Plan: General   Post-op Pain Management:    Induction: Intravenous  PONV Risk Score and Plan: 1 and Propofol infusion  Airway Management Planned: Natural Airway  Additional Equipment:   Intra-op  Plan:   Post-operative Plan:   Informed Consent: I have reviewed the patients History and Physical, chart, labs and discussed the procedure including the risks, benefits and alternatives for the proposed anesthesia with the patient or authorized representative who has indicated his/her understanding and acceptance.   Dental advisory given  Plan Discussed with: CRNA and Anesthesiologist  Anesthesia Plan Comments:         Anesthesia Quick Evaluation

## 2017-05-29 NOTE — Progress Notes (Signed)
PHARMACY - PHYSICIAN COMMUNICATION CRITICAL VALUE ALERT - BLOOD CULTURE IDENTIFICATION (BCID)  [name] is an 56 y.o. male who presented to [facility] on 05/28/2017 with a chief complaint of infected port.   Assessment:  MRSA Bacteremia   Name of physician (or Provider) Contacted: [place]   Current antibiotics: Vanco/Zosyn  Changes to prescribed antibiotics recommended: Zosyn Stopped    Results for orders placed or performed during the hospital encounter of 05/28/17  Blood Culture ID Panel (Reflexed) (Collected: 05/28/2017  6:21 PM)  Result Value Ref Range   Enterococcus species NOT DETECTED NOT DETECTED   Listeria monocytogenes NOT DETECTED NOT DETECTED   Staphylococcus species DETECTED (A) NOT DETECTED   Staphylococcus aureus DETECTED (A) NOT DETECTED   Methicillin resistance DETECTED (A) NOT DETECTED   Streptococcus species NOT DETECTED NOT DETECTED   Streptococcus agalactiae NOT DETECTED NOT DETECTED   Streptococcus pneumoniae NOT DETECTED NOT DETECTED   Streptococcus pyogenes NOT DETECTED NOT DETECTED   Acinetobacter baumannii NOT DETECTED NOT DETECTED   Enterobacteriaceae species NOT DETECTED NOT DETECTED   Enterobacter cloacae complex NOT DETECTED NOT DETECTED   Escherichia coli NOT DETECTED NOT DETECTED   Klebsiella oxytoca NOT DETECTED NOT DETECTED   Klebsiella pneumoniae NOT DETECTED NOT DETECTED   Proteus species NOT DETECTED NOT DETECTED   Serratia marcescens NOT DETECTED NOT DETECTED   Haemophilus influenzae NOT DETECTED NOT DETECTED   Neisseria meningitidis NOT DETECTED NOT DETECTED   Pseudomonas aeruginosa NOT DETECTED NOT DETECTED   Candida albicans NOT DETECTED NOT DETECTED   Candida glabrata NOT DETECTED NOT DETECTED   Candida krusei NOT DETECTED NOT DETECTED   Candida parapsilosis NOT DETECTED NOT DETECTED   Candida tropicalis NOT DETECTED NOT DETECTED    [name] L 05/29/2017  3:49 PM

## 2017-05-29 NOTE — Anesthesia Postprocedure Evaluation (Signed)
Anesthesia Post Note  Patient: [name]  Procedure(s) Performed: REMOVAL PORT-A-CATH (N/A )  Patient location during evaluation: PACU Anesthesia Type: General Level of consciousness: awake and alert and oriented Pain management: pain level controlled Vital Signs Assessment: post-procedure vital signs reviewed and stable Respiratory status: spontaneous breathing, nonlabored ventilation and respiratory function stable Cardiovascular status: blood pressure returned to baseline and stable Postop Assessment: no signs of nausea or vomiting Anesthetic complications: no   Plan to have hospitalist evaluate need for vasopressors  Last Vitals:  Vitals:   05/29/17 1445 05/29/17 1454  BP: (!) 79/57 (!) 84/60  Pulse: 73 74  Resp: (!) 22 (!) 21  Temp:    SpO2: 96% 100%    Last Pain:  Vitals:   05/29/17 1454  TempSrc:   PainSc: Asleep                 Hilario Robarts

## 2017-05-29 NOTE — Progress Notes (Signed)
MEDICATION RELATED CONSULT NOTE - INITIAL   Pharmacy Consult for electrolyte management for 56 yo male admitted with infected port. Patient with hypokalemia. Patient currently in surgery. Patient received potassium 32mEq IV x 4 and magnesium 2g IV 1. Patient is receiving NS/37mEq of Potassium at 63mL/hr.   Plan:  Will order BMP/Magnesium/Phosphorus at 2000.   Allergies  Allergen Reactions  . Aspirin Itching and Other (See Comments)    Reaction: abdominal pain    Patient Measurements: Height: 5\' 7"  (170.2 cm) Weight: 105 lb (47.6 kg) IBW/kg (Calculated) : 66.1  Vital Signs: Temp: 99.1 F (37.3 C) (11/30 1327) Temp Source: Temporal (11/30 1327) BP: 81/60 (11/30 1532) Pulse Rate: 72 (11/30 1532) Intake/Output from previous day: 11/29 0701 - 11/30 0700 In: 4125 [P.O.:120; I.V.:1605; IV Piggyback:2400] Out: -  Intake/Output from this shift: Total I/O In: 700 [I.V.:200; IV Piggyback:500] Out: 2 [Blood:2]  Labs: Recent Labs    05/28/17 1820 05/29/17 0352  WBC 23.7* 18.3*  HGB 9.4* 8.1*  HCT 28.5* 24.5*  PLT 172 156  CREATININE 0.65 0.56*  ALBUMIN 2.7* 2.1*  PROT 6.3* 5.2*  AST 23 23  ALT 20 16*  ALKPHOS 142* 125  BILITOT 0.3 0.5   Estimated Creatinine Clearance: 68.6 mL/min (A) (by C-G formula based on SCr of 0.56 mg/dL (L)).    Medical History: Past Medical History:  Diagnosis Date  . Anemia   . BPH without obstruction/lower urinary tract symptoms   . Cancer ([place])   . Cellulitis of scrotum   . Cyst of prostate 05/13/2015  . Diabetes ([place])   . Difficulty urinating   . Dyspnea   . Epididymoorchitis   . ETOH abuse   . Fracture of right tibia and fibula   . GERD (gastroesophageal reflux disease)   . GI bleed   . Malignant neoplasm of sigmoid colon ([place])   . Pancreatitis   . Trichomoniasis   . Wears dentures    Partial upper and lower.  reports poor fit.    Pharmacy will continue to monitor and adjust per consult.   Maela Takeda  L 05/29/2017,3:50 PM

## 2017-05-29 NOTE — Transfer of Care (Signed)
Immediate Anesthesia Transfer of Care Note  Patient: [name]  Procedure(s) Performed: REMOVAL PORT-A-CATH (N/A )  Patient Location: PACU  Anesthesia Type:General  Level of Consciousness: sedated  Airway & Oxygen Therapy: Patient Spontanous Breathing and Patient connected to face mask oxygen  Post-op Assessment: Report given to RN and Post -op Vital signs reviewed and stable  Post vital signs: Reviewed and stable  Last Vitals:  Vitals:   05/29/17 1229 05/29/17 1327  BP: 101/77 (!) 79/53  Pulse: 98 76  Resp:  19  Temp: 37.4 C 37.3 C  SpO2:  100%    Last Pain:  Vitals:   05/29/17 1327  TempSrc: Temporal  PainSc:          Complications: No apparent anesthesia complications

## 2017-05-29 NOTE — Progress Notes (Signed)
Pharmacist [facility] note:  Lab called with Patient with Blood Cx + = 4 out of 4 bottles positive for GPC, Staph aureus, MecA +. Patient is on Vancomycin/Zosyn  Information called to clinical Pharmacist Hetty Ely PharmD Clinical Pharmacist 05/29/2017

## 2017-05-29 NOTE — Consult Note (Signed)
[facility] Infectious Disease     Reason for Consult: MRA bacteremia, infected PC  Referring Physician: Karlton Lemon Date of Admission:  05/28/2017   Principal Problem:   Sepsis ([place]) Active Problems:   Type 2 diabetes mellitus ([place])   ETOH abuse   BPH (benign prostatic hyperplasia)   HPI: [name] is a 56 y.o. male s/p laparoscopic LAR for stage 3 rectal adenocarcinoma presented with altered mental status and sepsis attributed to infection of his Right central venous catheter 11 days after it was placed due to his picking at site. Patient has long struggled with alcohol abuse, chronic pancreatitis, and brittle poorly controlled DM with baseline blood glucoses ~800's s/p distal pancreatectomy also for chronic alcohol-induced pancreatitis. On 11/26  He was seen in ED for elevated sugars, patient was noted to have erythema surrounding his port during presentation for hyperglycemia, and he was prescribed antibiotics, but did not/was not able to fill the prescription. On admit wbc was 23 and he was septic. BCX + MRSA. Now s/p removal of his infected cath on 11/30.Marland Kitchen Past Medical History:  Diagnosis Date  . Anemia   . BPH without obstruction/lower urinary tract symptoms   . Cancer ([place])   . Cellulitis of scrotum   . Cyst of prostate 05/13/2015  . Diabetes ([place])   . Difficulty urinating   . Dyspnea   . Epididymoorchitis   . ETOH abuse   . Fracture of right tibia and fibula   . GERD (gastroesophageal reflux disease)   . GI bleed   . Malignant neoplasm of sigmoid colon ([place])   . Pancreatitis   . Trichomoniasis   . Wears dentures    Partial upper and lower.  reports poor fit.   Past Surgical History:  Procedure Laterality Date  . COLONOSCOPY WITH PROPOFOL N/A 12/02/2016   Procedure: COLONOSCOPY WITH PROPOFOL;  Surgeon: Lucilla Lame, MD;  Location: [facility] ENDOSCOPY;  Service: Endoscopy;  Laterality: N/A;  . ESOPHAGOGASTRODUODENOSCOPY (EGD) WITH PROPOFOL N/A 12/02/2016   Procedure:  ESOPHAGOGASTRODUODENOSCOPY (EGD) WITH PROPOFOL;  Surgeon: Lucilla Lame, MD;  Location: [facility] ENDOSCOPY;  Service: Endoscopy;  Laterality: N/A;  . FLEXIBLE SIGMOIDOSCOPY N/A 02/05/2017   Procedure: FLEXIBLE SIGMOIDOSCOPY;  Surgeon: Lucilla Lame, MD;  Location: [place];  Service: Gastroenterology;  Laterality: N/A;  Needs labs drawn. Needs to come in early.  No anesthesia  . FRACTURE SURGERY     TIBIA AND FIBULA  . FRACTURE SURGERY    . LAPAROSCOPIC PARTIAL COLECTOMY  04/17/2017   [facility]  . PORTACATH PLACEMENT Right 05/18/2017   Procedure: INSERTION PORT-A-CATH;  Surgeon: Vickie Epley, MD;  Location: [facility] ORS;  Service: Vascular;  Laterality: Right;  . resection of pancreas    . SCROTAL EXPLORATION     Social History   Tobacco Use  . Smoking status: Current Every Day Smoker    Packs/day: 0.25    Years: 41.00    Pack years: 10.25    Types: Cigarettes  . Smokeless tobacco: Never Used  . Tobacco comment: since age 79  Substance Use Topics  . Alcohol use: Yes    Alcohol/week: 0.0 oz    Comment: occasionally - but says not drinkning currently  . Drug use: No   Family History  Problem Relation Age of Onset  . Cancer - Colon Father   . Colon cancer Brother     Allergies:  Allergies  Allergen Reactions  . Aspirin Itching and Other (See Comments)    Reaction: abdominal pain  Current antibiotics: Antibiotics Given (last 72 hours)    Date/Time Action Medication Dose Rate   05/28/17 1926 New Bag/Given   piperacillin-tazobactam (ZOSYN) IVPB 3.375 g 3.375 g 100 mL/hr   05/28/17 1926 New Bag/Given   vancomycin (VANCOCIN) IVPB 1000 mg/200 mL premix 1,000 mg 200 mL/hr   05/29/17 0148 New Bag/Given   piperacillin-tazobactam (ZOSYN) IVPB 3.375 g 3.375 g 12.5 mL/hr   05/29/17 1130 New Bag/Given   vancomycin (VANCOCIN) IVPB 1000 mg/200 mL premix 1,000 mg 200 mL/hr   05/29/17 1130 New Bag/Given   piperacillin-tazobactam (ZOSYN) IVPB 3.375 g 3.375 g 12.5 mL/hr       MEDICATIONS: . enoxaparin (LOVENOX) injection  40 mg Subcutaneous Q24H  . folic acid  1 mg Oral Daily  . insulin aspart  0-9 Units Subcutaneous Q6H  . LORazepam  0-4 mg Intravenous Q6H   Followed by  . [START ON 05/30/2017] LORazepam  0-4 mg Intravenous Q12H  . multivitamin with minerals  1 tablet Oral Daily  . thiamine  100 mg Oral Daily   Or  . thiamine  100 mg Intravenous Daily    Review of Systems - 11 systems reviewed and negative per HPI   OBJECTIVE: Temp:  [98.1 F (36.7 C)-99.4 F (37.4 C)] 99 F (37.2 C) (11/30 1553) Pulse Rate:  [68-110] 79 (11/30 1604) Resp:  [15-31] 25 (11/30 1604) BP: (70-135)/(44-83) 83/63 (11/30 1604) SpO2:  [96 %-100 %] 100 % (11/30 1604) Weight:  [47.6 kg (105 lb)] 47.6 kg (105 lb) (11/29 1720) Physical Exam  Constitutional: in ICU, confused HENT: anicteric Mouth/Throat: Oropharynx is clear and moist. No oropharyngeal exudate.  Cardiovascular: Normal rate, regular rhythm and normal heart sounds.  Pulmonary/Chest: Effort normal and breath sounds normal. No respiratory distress. He has no wheezes.  ANt chest wall PC site with induration and erythema as well as tenderness Abdominal: Soft. Bowel sounds are normal. He exhibits no distension. There is no tenderness.  Lymphadenopathy: He has no cervical adenopathy.  Neurological: lethargic Skin: Skin is warm and dry. No rash noted. No erythema.  Psychiatric: lethargic   LABS: Results for orders placed or performed during the hospital encounter of 05/28/17 (from the past 48 hour(s))  Glucose, capillary     Status: Abnormal   Collection Time: 05/28/17  5:08 PM  Result Value Ref Range   Glucose-Capillary 112 (H) 65 - 99 mg/dL  CBC with Differential/Platelet     Status: Abnormal   Collection Time: 05/28/17  6:20 PM  Result Value Ref Range   WBC 23.7 (H) 3.8 - 10.6 K/uL   RBC 3.06 (L) 4.40 - 5.90 MIL/uL   Hemoglobin 9.4 (L) 13.0 - 18.0 g/dL   HCT 28.5 (L) 40.0 - 52.0 %   MCV 93.2 80.0 -  100.0 fL   MCH 30.8 26.0 - 34.0 pg   MCHC 33.0 32.0 - 36.0 g/dL   RDW 14.7 (H) 11.5 - 14.5 %   Platelets 172 150 - 440 K/uL   Neutrophils Relative % 88 %   Neutro Abs 20.6 (H) 1.4 - 6.5 K/uL   Lymphocytes Relative 4 %   Lymphs Abs 1.0 1.0 - 3.6 K/uL   Monocytes Relative 8 %   Monocytes Absolute 2.0 (H) 0.2 - 1.0 K/uL   Eosinophils Relative 0 %   Eosinophils Absolute 0.0 0 - 0.7 K/uL   Basophils Relative 0 %   Basophils Absolute 0.1 0 - 0.1 K/uL  Comprehensive metabolic panel     Status: Abnormal   Collection Time:  05/28/17  6:20 PM  Result Value Ref Range   Sodium 138 135 - 145 mmol/L   Potassium 3.3 (L) 3.5 - 5.1 mmol/L   Chloride 100 (L) 101 - 111 mmol/L   CO2 28 22 - 32 mmol/L   Glucose, Bld 98 65 - 99 mg/dL   BUN 8 6 - 20 mg/dL   Creatinine, Ser 0.65 0.61 - 1.24 mg/dL   Calcium 8.8 (L) 8.9 - 10.3 mg/dL   Total Protein 6.3 (L) 6.5 - 8.1 g/dL   Albumin 2.7 (L) 3.5 - 5.0 g/dL   AST 23 15 - 41 U/L   ALT 20 17 - 63 U/L   Alkaline Phosphatase 142 (H) 38 - 126 U/L   Total Bilirubin 0.3 0.3 - 1.2 mg/dL   GFR calc non Af Amer >60 >60 mL/min   GFR calc Af Amer >60 >60 mL/min    Comment: (NOTE) The eGFR has been calculated using the CKD EPI equation. This calculation has not been validated in all clinical situations. eGFR's persistently <60 mL/min signify possible Chronic Kidney Disease.    Anion gap 10 5 - 15  Troponin I     Status: Abnormal   Collection Time: 05/28/17  6:20 PM  Result Value Ref Range   Troponin I 0.07 (HH) <0.03 ng/mL    Comment: CRITICAL RESULT CALLED TO, READ BACK BY AND VERIFIED WITH RACHAEL HAYDEN @ 2110 ON 05/28/2017 BY CAF   Lactic acid, plasma     Status: Abnormal   Collection Time: 05/28/17  6:20 PM  Result Value Ref Range   Lactic Acid, Venous 3.0 (HH) 0.5 - 1.9 mmol/L    Comment: CRITICAL RESULT CALLED TO, READ BACK BY AND VERIFIED WITH RACHAEL HADDEN 05/28/17 @ [place]     Status: None   Collection Time: 05/28/17  6:20 PM   Result Value Ref Range   Prothrombin Time 14.2 11.4 - 15.2 seconds   INR 1.11   Culture, blood (Routine x 2)     Status: None (Preliminary result)   Collection Time: 05/28/17  6:21 PM  Result Value Ref Range   Specimen Description BLOOD BLOOD LEFT ARM    Special Requests      BOTTLES DRAWN AEROBIC AND ANAEROBIC Blood Culture adequate volume   Culture  Setup Time      Organism ID to follow GRAM POSITIVE COCCI AEROBIC BOTTLE ONLY CRITICAL RESULT CALLED TO, READ BACK BY AND VERIFIED WITH: KRISTIN MERRILL AT 5697 05/29/17 [place]    Culture GRAM POSITIVE COCCI    Report Status PENDING   Blood Culture ID Panel (Reflexed)     Status: Abnormal   Collection Time: 05/28/17  6:21 PM  Result Value Ref Range   Enterococcus species NOT DETECTED NOT DETECTED   Listeria monocytogenes NOT DETECTED NOT DETECTED   Staphylococcus species DETECTED (A) NOT DETECTED    Comment: CRITICAL RESULT CALLED TO, READ BACK BY AND VERIFIED WITH:  KRISTIN MERRILL AT 9480 05/29/17 SDR    Staphylococcus aureus DETECTED (A) NOT DETECTED    Comment: Methicillin (oxacillin)-resistant Staphylococcus aureus (MRSA). MRSA is predictably resistant to beta-lactam antibiotics (except ceftaroline). Preferred therapy is vancomycin unless clinically contraindicated. Patient requires contact precautions if  hospitalized. CRITICAL RESULT CALLED TO, READ BACK BY AND VERIFIED WITH:  KRISTIN MERRILL AT 1655 05/29/17 SDR    Methicillin resistance DETECTED (A) NOT DETECTED    Comment: CRITICAL RESULT CALLED TO, READ BACK BY AND VERIFIED WITH:  KRISTIN MERRILL AT 3748  05/29/17 SDR    Streptococcus species NOT DETECTED NOT DETECTED   Streptococcus agalactiae NOT DETECTED NOT DETECTED   Streptococcus pneumoniae NOT DETECTED NOT DETECTED   Streptococcus pyogenes NOT DETECTED NOT DETECTED   Acinetobacter baumannii NOT DETECTED NOT DETECTED   Enterobacteriaceae species NOT DETECTED NOT DETECTED   Enterobacter cloacae complex NOT  DETECTED NOT DETECTED   Escherichia coli NOT DETECTED NOT DETECTED   Klebsiella oxytoca NOT DETECTED NOT DETECTED   Klebsiella pneumoniae NOT DETECTED NOT DETECTED   Proteus species NOT DETECTED NOT DETECTED   Serratia marcescens NOT DETECTED NOT DETECTED   Haemophilus influenzae NOT DETECTED NOT DETECTED   Neisseria meningitidis NOT DETECTED NOT DETECTED   Pseudomonas aeruginosa NOT DETECTED NOT DETECTED   Candida albicans NOT DETECTED NOT DETECTED   Candida glabrata NOT DETECTED NOT DETECTED   Candida krusei NOT DETECTED NOT DETECTED   Candida parapsilosis NOT DETECTED NOT DETECTED   Candida tropicalis NOT DETECTED NOT DETECTED  Blood gas, venous     Status: Abnormal (Preliminary result)   Collection Time: 05/28/17  6:22 PM  Result Value Ref Range   FIO2 PENDING    pH, Ven 7.35 7.250 - 7.430   pCO2, Ven 54 44.0 - 60.0 mmHg   pO2, Ven <31.0 (LL) 32.0 - 45.0 mmHg    Comment: VENOUS   Bicarbonate 29.8 (H) 20.0 - 28.0 mmol/L   Acid-Base Excess 3.3 (H) 0.0 - 2.0 mmol/L   Patient temperature 37.0    Collection site VEIN    Sample type VENOUS   Culture, blood (Routine x 2)     Status: None (Preliminary result)   Collection Time: 05/28/17  7:10 PM  Result Value Ref Range   Specimen Description BLOOD LEFT ANTECUBITAL    Special Requests      BOTTLES DRAWN AEROBIC AND ANAEROBIC Blood Culture adequate volume   Culture NO GROWTH < 12 HOURS    Report Status PENDING   Lactic acid, plasma     Status: None   Collection Time: 05/28/17  8:18 PM  Result Value Ref Range   Lactic Acid, Venous 1.2 0.5 - 1.9 mmol/L  Glucose, capillary     Status: Abnormal   Collection Time: 05/28/17  8:32 PM  Result Value Ref Range   Glucose-Capillary 144 (H) 65 - 99 mg/dL  Surgical pcr screen     Status: Abnormal   Collection Time: 05/28/17 10:44 PM  Result Value Ref Range   MRSA, PCR POSITIVE (A) NEGATIVE    Comment: RESULT CALLED TO, READ BACK BY AND VERIFIED WITH: MAT PAGE ON 05/29/17 AT 3212 BY JAG     Staphylococcus aureus POSITIVE (A) NEGATIVE    Comment: RESULT CALLED TO, READ BACK BY AND VERIFIED WITH: MAT PAGE ON 05/29/17 AT 0213 BY JAG (NOTE) The Xpert SA Assay (FDA approved for NASAL specimens in patients 52 years of age and older), is one component of a comprehensive surveillance program. It is not intended to diagnose infection nor to guide or monitor treatment.   Lactic acid, plasma     Status: None   Collection Time: 05/28/17 11:22 PM  Result Value Ref Range   Lactic Acid, Venous 1.5 0.5 - 1.9 mmol/L  Glucose, capillary     Status: Abnormal   Collection Time: 05/29/17 12:21 AM  Result Value Ref Range   Glucose-Capillary 231 (H) 65 - 99 mg/dL  CBC     Status: Abnormal   Collection Time: 05/29/17  3:52 AM  Result Value Ref Range  WBC 18.3 (H) 3.8 - 10.6 K/uL   RBC 2.61 (L) 4.40 - 5.90 MIL/uL   Hemoglobin 8.1 (L) 13.0 - 18.0 g/dL   HCT 24.5 (L) 40.0 - 52.0 %   MCV 93.6 80.0 - 100.0 fL   MCH 30.9 26.0 - 34.0 pg   MCHC 33.0 32.0 - 36.0 g/dL   RDW 14.8 (H) 11.5 - 14.5 %   Platelets 156 150 - 440 K/uL  Comprehensive metabolic panel     Status: Abnormal   Collection Time: 05/29/17  3:52 AM  Result Value Ref Range   Sodium 139 135 - 145 mmol/L   Potassium 2.7 (LL) 3.5 - 5.1 mmol/L    Comment: CRITICAL RESULT CALLED TO, READ BACK BY AND VERIFIED WITH DEBRA GLADDING AT 1308 05/29/17 [place]    Chloride 107 101 - 111 mmol/L   CO2 25 22 - 32 mmol/L   Glucose, Bld 106 (H) 65 - 99 mg/dL   BUN 7 6 - 20 mg/dL   Creatinine, Ser 0.56 (L) 0.61 - 1.24 mg/dL   Calcium 7.9 (L) 8.9 - 10.3 mg/dL   Total Protein 5.2 (L) 6.5 - 8.1 g/dL   Albumin 2.1 (L) 3.5 - 5.0 g/dL   AST 23 15 - 41 U/L   ALT 16 (L) 17 - 63 U/L   Alkaline Phosphatase 125 38 - 126 U/L   Total Bilirubin 0.5 0.3 - 1.2 mg/dL   GFR calc non Af Amer >60 >60 mL/min   GFR calc Af Amer >60 >60 mL/min    Comment: (NOTE) The eGFR has been calculated using the CKD EPI equation. This calculation has not been validated in  all clinical situations. eGFR's persistently <60 mL/min signify possible Chronic Kidney Disease.    Anion gap 7 5 - 15  Procalcitonin - Baseline     Status: None   Collection Time: 05/29/17  3:52 AM  Result Value Ref Range   Procalcitonin 1.31 ng/mL    Comment:        Interpretation: PCT > 0.5 ng/mL and <= 2 ng/mL: Systemic infection (sepsis) is possible, but other conditions are known to elevate PCT as well. (NOTE)       Sepsis PCT Algorithm           Lower Respiratory Tract                                      Infection PCT Algorithm    ----------------------------     ----------------------------         PCT < 0.25 ng/mL                PCT < 0.10 ng/mL         Strongly encourage             Strongly discourage   discontinuation of antibiotics    initiation of antibiotics    ----------------------------     -----------------------------       PCT 0.25 - 0.50 ng/mL            PCT 0.10 - 0.25 ng/mL               OR       >80% decrease in PCT            Discourage initiation of  antibiotics      Encourage discontinuation           of antibiotics    ----------------------------     -----------------------------         PCT >= 0.50 ng/mL              PCT 0.26 - 0.50 ng/mL                AND       <80% decrease in PCT             Encourage initiation of                                             antibiotics       Encourage continuation           of antibiotics    ----------------------------     -----------------------------        PCT >= 0.50 ng/mL                  PCT > 0.50 ng/mL               AND         increase in PCT                  Strongly encourage                                      initiation of antibiotics    Strongly encourage escalation           of antibiotics                                     -----------------------------                                           PCT <= 0.25 ng/mL                                                  OR                                        > 80% decrease in PCT                                     Discontinue / Do not initiate                                             antibiotics   Glucose, capillary     Status: None   Collection Time: 05/29/17  6:00 AM  Result Value Ref Range   Glucose-Capillary 73 65 -  99 mg/dL  Glucose, capillary     Status: Abnormal   Collection Time: 05/29/17 11:31 AM  Result Value Ref Range   Glucose-Capillary 115 (H) 65 - 99 mg/dL  Glucose, capillary     Status: Abnormal   Collection Time: 05/29/17  1:36 PM  Result Value Ref Range   Glucose-Capillary 134 (H) 65 - 99 mg/dL   No components found for: ESR, C REACTIVE PROTEIN MICRO: Recent Results (from the past 720 hour(s))  Surgical pcr screen     Status: None   Collection Time: 05/18/17  7:00 AM  Result Value Ref Range Status   MRSA, PCR NEGATIVE NEGATIVE Final   Staphylococcus aureus NEGATIVE NEGATIVE Final    Comment: (NOTE) The Xpert SA Assay (FDA approved for NASAL specimens in patients 27 years of age and older), is one component of a comprehensive surveillance program. It is not intended to diagnose infection nor to guide or monitor treatment.   Culture, blood (Routine x 2)     Status: None (Preliminary result)   Collection Time: 05/28/17  6:21 PM  Result Value Ref Range Status   Specimen Description BLOOD BLOOD LEFT ARM  Final   Special Requests   Final    BOTTLES DRAWN AEROBIC AND ANAEROBIC Blood Culture adequate volume   Culture  Setup Time   Final    Organism ID to follow GRAM POSITIVE COCCI AEROBIC BOTTLE ONLY CRITICAL RESULT CALLED TO, READ BACK BY AND VERIFIED WITH: KRISTIN MERRILL AT 3832 05/29/17 [place]    Culture GRAM POSITIVE COCCI  Final   Report Status PENDING  Incomplete  Blood Culture ID Panel (Reflexed)     Status: Abnormal   Collection Time: 05/28/17  6:21 PM  Result Value Ref Range Status   Enterococcus species NOT DETECTED NOT DETECTED Final    Listeria monocytogenes NOT DETECTED NOT DETECTED Final   Staphylococcus species DETECTED (A) NOT DETECTED Final    Comment: CRITICAL RESULT CALLED TO, READ BACK BY AND VERIFIED WITH:  KRISTIN MERRILL AT 9191 05/29/17 SDR    Staphylococcus aureus DETECTED (A) NOT DETECTED Final    Comment: Methicillin (oxacillin)-resistant Staphylococcus aureus (MRSA). MRSA is predictably resistant to beta-lactam antibiotics (except ceftaroline). Preferred therapy is vancomycin unless clinically contraindicated. Patient requires contact precautions if  hospitalized. CRITICAL RESULT CALLED TO, READ BACK BY AND VERIFIED WITH:  KRISTIN MERRILL AT 6606 05/29/17 SDR    Methicillin resistance DETECTED (A) NOT DETECTED Final    Comment: CRITICAL RESULT CALLED TO, READ BACK BY AND VERIFIED WITH:  KRISTIN MERRILL AT 0045 05/29/17 SDR    Streptococcus species NOT DETECTED NOT DETECTED Final   Streptococcus agalactiae NOT DETECTED NOT DETECTED Final   Streptococcus pneumoniae NOT DETECTED NOT DETECTED Final   Streptococcus pyogenes NOT DETECTED NOT DETECTED Final   Acinetobacter baumannii NOT DETECTED NOT DETECTED Final   Enterobacteriaceae species NOT DETECTED NOT DETECTED Final   Enterobacter cloacae complex NOT DETECTED NOT DETECTED Final   Escherichia coli NOT DETECTED NOT DETECTED Final   Klebsiella oxytoca NOT DETECTED NOT DETECTED Final   Klebsiella pneumoniae NOT DETECTED NOT DETECTED Final   Proteus species NOT DETECTED NOT DETECTED Final   Serratia marcescens NOT DETECTED NOT DETECTED Final   Haemophilus influenzae NOT DETECTED NOT DETECTED Final   Neisseria meningitidis NOT DETECTED NOT DETECTED Final   Pseudomonas aeruginosa NOT DETECTED NOT DETECTED Final   Candida albicans NOT DETECTED NOT DETECTED Final   Candida glabrata NOT DETECTED NOT DETECTED Final   Candida krusei NOT DETECTED  NOT DETECTED Final   Candida parapsilosis NOT DETECTED NOT DETECTED Final   Candida tropicalis NOT DETECTED NOT  DETECTED Final  Culture, blood (Routine x 2)     Status: None (Preliminary result)   Collection Time: 05/28/17  7:10 PM  Result Value Ref Range Status   Specimen Description BLOOD LEFT ANTECUBITAL  Final   Special Requests   Final    BOTTLES DRAWN AEROBIC AND ANAEROBIC Blood Culture adequate volume   Culture NO GROWTH < 12 HOURS  Final   Report Status PENDING  Incomplete  Surgical pcr screen     Status: Abnormal   Collection Time: 05/28/17 10:44 PM  Result Value Ref Range Status   MRSA, PCR POSITIVE (A) NEGATIVE Final    Comment: RESULT CALLED TO, READ BACK BY AND VERIFIED WITH: MAT PAGE ON 05/29/17 AT 5400 BY JAG    Staphylococcus aureus POSITIVE (A) NEGATIVE Final    Comment: RESULT CALLED TO, READ BACK BY AND VERIFIED WITH: MAT PAGE ON 05/29/17 AT 0213 BY JAG (NOTE) The Xpert SA Assay (FDA approved for NASAL specimens in patients 47 years of age and older), is one component of a comprehensive surveillance program. It is not intended to diagnose infection nor to guide or monitor treatment.     IMAGING: Ct Head Wo Contrast  Result Date: 05/28/2017 CLINICAL DATA:  Initial evaluation for acute altered mental status, increased confusion. History of prostate cancer. EXAM: CT HEAD WITHOUT CONTRAST TECHNIQUE: Contiguous axial images were obtained from the base of the skull through the vertex without intravenous contrast. COMPARISON:  Prior CT from 11/30/2016. FINDINGS: Brain: Stable atrophy with chronic small vessel ischemic disease. No acute intracranial hemorrhage. No acute large vessel territory infarct. No mass lesion, midline shift or mass effect. No hydrocephalus. No extra-axial fluid collection. Vascular: No hyperdense vessel. Scattered vascular calcifications noted within the carotid siphons. Skull: Scalp soft tissues and calvarium within normal limits. Sinuses/Orbits: Globes and orbital soft tissues normal. Paranasal sinuses and mastoid air cells are clear. Other: None. IMPRESSION:  1. No acute intracranial abnormality. 2. Stable atrophy with chronic small vessel ischemic disease. Electronically Signed   By: Jeannine Boga M.D.   On: 05/28/2017 18:21   Dg Chest Portable 1 View  Result Date: 05/28/2017 CLINICAL DATA:  Altered mental status. Prostate cancer and pancreatitis. EXAM: PORTABLE CHEST 1 VIEW COMPARISON:  05/25/2017 FINDINGS: Right Port-A-Cath terminates at the mid SVC. Midline trachea. Normal heart size and mediastinal contours. No pleural effusion or pneumothorax. Remote bilateral rib fractures. IMPRESSION: No acute cardiopulmonary disease. Electronically Signed   By: Abigail Miyamoto M.D.   On: 05/28/2017 17:39   Dg Chest Portable 1 View  Result Date: 05/25/2017 CLINICAL DATA:  Pt comes into the ED via POV c/o hyperglycemia. Patient's cancer Dr. called him and informed him that his sugar was over 600. Patient is diabetic and has not started his colon cancer treatments at this time. EXAM: PORTABLE CHEST 1 VIEW COMPARISON:  05/18/2017 FINDINGS: Patient is a right-sided Port-A-Cath, tip overlying the level of the superior vena cava. Heart size is normal. Lungs are free of focal consolidations and pleural effusions. No pulmonary edema. IMPRESSION: No evidence for acute cardiopulmonary abnormality. Electronically Signed   By: Nolon Nations M.D.   On: 05/25/2017 13:34   Dg Chest Port 1 View  Result Date: 05/18/2017 CLINICAL DATA:  Central line placement EXAM: PORTABLE CHEST 1 VIEW COMPARISON:  12/27/2016 FINDINGS: Right Port-A-Cath placement with the tip in the SVC. No pneumothorax. Heart is normal  size. Lungs are clear. Multiple old healed left rib fractures. No effusions. IMPRESSION: Right Port-A-Cath placement with the tip in the SVC. No pneumothorax. No active disease. Electronically Signed   By: Rolm Baptise M.D.   On: 05/18/2017 09:58   Dg C-arm 1-60 Min-no Report  Result Date: 05/18/2017 Fluoroscopy was utilized by the requesting physician.  No radiographic  interpretation.    Assessment:   [name] is a 56 y.o. male with MRSA bacteremia from infected Portacath, now s/p removal on 11/30.  He will need to remain free of central lines for 48 hours if possible to clear infection   Recommendations MRSA bacteremia from infected Portacath -Cath removed 11/30 -Will continue vancomycin. --Repeat BCX -Check echo -Will need 2 weeks min of IV Vancomycin once he clears his bacteremia. If BCX negative for 48 hours can place PICC Thank you very much for allowing me to participate in the care of this patient. Please call with questions.   Cheral Marker. Ola Spurr, MD

## 2017-05-29 NOTE — Plan of Care (Signed)
  Progressing Education: Knowledge of General Education information will improve 05/29/2017 0524 - Progressing by Meekah Math, Lucille Passy, RN Health Behavior/Discharge Planning: Ability to manage health-related needs will improve 05/29/2017 0524 - Progressing by Trudee Chirino, Lucille Passy, RN Clinical Measurements: Ability to maintain clinical measurements within normal limits will improve 05/29/2017 0524 - Progressing by Antuan Limes, Lucille Passy, RN Will remain free from infection 05/29/2017 0524 - Progressing by Nadie Fiumara, Lucille Passy, RN Diagnostic test results will improve 05/29/2017 0524 - Progressing by Zahira Brummond, Lucille Passy, RN Respiratory complications will improve 05/29/2017 0524 - Progressing by Bryna Colander, RN Cardiovascular complication will be avoided 05/29/2017 0524 - Progressing by Bryna Colander, RN Activity: Risk for activity intolerance will decrease 05/29/2017 0524 - Progressing by Bryna Colander, RN Nutrition: Adequate nutrition will be maintained 05/29/2017 0524 - Progressing by Bryna Colander, RN Coping: Level of anxiety will decrease 05/29/2017 0524 - Progressing by Bryna Colander, RN Elimination: Will not experience complications related to bowel motility 05/29/2017 0524 - Progressing by Bryna Colander, RN Will not experience complications related to urinary retention 05/29/2017 0524 - Progressing by Burnell Matlin, Lucille Passy, RN Pain Managment: General experience of comfort will improve 05/29/2017 0524 - Progressing by Randle Shatzer, Lucille Passy, RN Safety: Ability to remain free from injury will improve 05/29/2017 0524 - Progressing by Micheal Murad, Lucille Passy, RN Skin Integrity: Risk for impaired skin integrity will decrease 05/29/2017 0524 - Progressing by Jaice Digioia, Lucille Passy, RN

## 2017-05-29 NOTE — Progress Notes (Signed)
MEDICATION RELATED CONSULT NOTE - INITIAL   Pharmacy Consult for electrolyte management for 56 yo male admitted with infected port. Patient with hypokalemia. Patient currently in surgery. Patient received potassium 73mEq IV x 4 and magnesium 2g IV 1. Patient is receiving NS/21mEq of Potassium at 46mL/hr.   Plan:  Will order BMP/Magnesium/Phosphorus at 2000.   05/29/2017 1806 K 2.9, Mg 1.7, phos 3.2. CCM has ordered magnesium 1 gm IV x 1 and KCl 10 mEq IV Q1H x 6 doses. Will continue current regimen and recheck electrolytes tomorrow with AM labs. -NAC  Allergies  Allergen Reactions  . Aspirin Itching and Other (See Comments)    Reaction: abdominal pain    Patient Measurements: Height: 5\' 8"  (172.7 cm) Weight: 123 lb 7.3 oz (56 kg) IBW/kg (Calculated) : 68.4  Vital Signs: Temp: 97.7 F (36.5 C) (11/30 1930) Temp Source: Oral (11/30 1930) BP: 96/67 (11/30 1800) Pulse Rate: 85 (11/30 1800) Intake/Output from previous day: 11/29 0701 - 11/30 0700 In: 4125 [P.O.:120; I.V.:1605; IV Piggyback:2400] Out: -  Intake/Output from this shift: No intake/output data recorded.  Labs: Recent Labs    05/28/17 1820 05/29/17 0352 05/29/17 1806  WBC 23.7* 18.3* 14.6*  HGB 9.4* 8.1* 9.1*  HCT 28.5* 24.5* 27.0*  PLT 172 156 166  CREATININE 0.65 0.56* 0.53*  MG  --   --  1.7  PHOS  --   --  3.2  ALBUMIN 2.7* 2.1* 2.2*  PROT 6.3* 5.2* 5.7*  AST 23 23 31   ALT 20 16* 20  ALKPHOS 142* 125 154*  BILITOT 0.3 0.5 0.5   Estimated Creatinine Clearance: 80.7 mL/min (A) (by C-G formula based on SCr of 0.53 mg/dL (L)).    Medical History: Past Medical History:  Diagnosis Date  . Anemia   . BPH without obstruction/lower urinary tract symptoms   . Cancer ([place])   . Cellulitis of scrotum   . Cyst of prostate 05/13/2015  . Diabetes ([place])   . Difficulty urinating   . Dyspnea   . Epididymoorchitis   . ETOH abuse   . Fracture of right tibia and fibula   . GERD (gastroesophageal reflux  disease)   . GI bleed   . Malignant neoplasm of sigmoid colon ([place])   . Pancreatitis   . Trichomoniasis   . Wears dentures    Partial upper and lower.  reports poor fit.    Pharmacy will continue to monitor and adjust per consult.   Ovid Curd A Tongela Encinas 05/29/2017,8:10 PM

## 2017-05-29 NOTE — Progress Notes (Signed)
Spoke with MD Rosilyn Mings potassium 2.7 and low bp.  See new orders

## 2017-05-29 NOTE — Consult Note (Signed)
[facility] Consultation      Name: [name] MRN: [id number] DOB: 09-16-1960    ADMISSION DATE:  05/28/2017 CONSULTATION DATE:  05/29/17  Consulting MD :  Dr. Leslye Peer   CHIEF COMPLAINT:   Hypotension  HISTORY OF PRESENT ILLNESS   56 years old gentleman with past medical history significant for sigmoid carcinoma, anemia, BPH, diabetes, alcohol abuse, GERD, GI bleed who was admitted to the hospital yesterday with altered mental status and sepsis. The patient has a history of the recent port placement on 18 May 2017 for rectal carcinoma.  He was brought to the emergency room by his family for altered mental status and was found to be septic with hypotension.  He was treated with IV fluids with improvement in his blood pressure.  He was found to have purulent drainage from the site of the port placement.  Blood culture sent in the ER were positive for ram-positive cocci. Workup in the ER included blood cultures as mentioned above, CT scan of the head that was negative for acute intracranial abnormality and lab workup which was grossly unremarkable except for an elevated lactate of 3 which improved down to 1.2 after fluid resuscitation. Chest x-ray did not show any acute cardiopulmonary disease. The patient was admitted to the floor and was taken to the OR for removal of the port.  Postoperatively, the patient became hypotensive requiring aggressive fluid resuscitation. He received 1 L of normal saline in the PACU and was transferred to the SDU/ICU for further care. On arrival to the unit, the patient was somnolent but was following commands.  His blood pressure was 144 systolic.  He was hemodynamically stable.  He was not on vasopressors. He was on room air and was saturating well.  Limited history is available as the family is not available for interview and the patient is not communicating adequately though he is following commands.  History is  obtained from chart review.  Limited history is available as the family is not available for interview and the patient is   PAST MEDICAL HISTORY    :  Past Medical History:  Diagnosis Date  . Anemia   . BPH without obstruction/lower urinary tract symptoms   . Cancer ([place])   . Cellulitis of scrotum   . Cyst of prostate 05/13/2015  . Diabetes ([place])   . Difficulty urinating   . Dyspnea   . Epididymoorchitis   . ETOH abuse   . Fracture of right tibia and fibula   . GERD (gastroesophageal reflux disease)   . GI bleed   . Malignant neoplasm of sigmoid colon ([place])   . Pancreatitis   . Trichomoniasis   . Wears dentures    Partial upper and lower.  reports poor fit.   Past Surgical History:  Procedure Laterality Date  . COLONOSCOPY WITH PROPOFOL N/A 12/02/2016   Procedure: COLONOSCOPY WITH PROPOFOL;  Surgeon: Lucilla Lame, MD;  Location: [facility] ENDOSCOPY;  Service: Endoscopy;  Laterality: N/A;  . ESOPHAGOGASTRODUODENOSCOPY (EGD) WITH PROPOFOL N/A 12/02/2016   Procedure: ESOPHAGOGASTRODUODENOSCOPY (EGD) WITH PROPOFOL;  Surgeon: Lucilla Lame, MD;  Location: [facility] ENDOSCOPY;  Service: Endoscopy;  Laterality: N/A;  . FLEXIBLE SIGMOIDOSCOPY N/A 02/05/2017   Procedure: FLEXIBLE SIGMOIDOSCOPY;  Surgeon: Lucilla Lame, MD;  Location: [facility];  Service: Gastroenterology;  Laterality: N/A;  Needs labs drawn. Needs to come in early.  No anesthesia  . FRACTURE SURGERY     TIBIA AND FIBULA  . FRACTURE SURGERY    .  LAPAROSCOPIC PARTIAL COLECTOMY  04/17/2017   [facility]  . PORTACATH PLACEMENT Right 05/18/2017   Procedure: INSERTION PORT-A-CATH;  Surgeon: Vickie Epley, MD;  Location: [facility] ORS;  Service: Vascular;  Laterality: Right;  . resection of pancreas    . SCROTAL EXPLORATION     Prior to Admission medications   Medication Sig Start Date End Date Taking? Authorizing Provider  amitriptyline (ELAVIL) 25 MG tablet Take 25 mg by mouth. 02/17/17  Yes [provider]  aspirin EC 81  MG tablet Take 81 mg by mouth. 07/20/15  Yes [provider]  cephALEXin (KEFLEX) 500 MG capsule Take 1 capsule (500 mg total) by mouth 2 (two) times daily for 7 days. 05/26/17 06/02/17 Yes Arta Silence, MD  cyclobenzaprine (FLEXERIL) 10 MG tablet Take 10 mg by mouth 2 (two) times daily. 02/15/16  Yes [provider]  LANTUS SOLOSTAR 100 UNIT/ML Solostar Pen Inject 10 Units into the skin 2 (two) times daily. Patient taking differently: Inject 10 Units into the skin daily at 10 pm.  07/02/16  Yes Harvest Dark, MD  lipase/protease/amylase (CREON) 36000 UNITS CPEP capsule Take 1 capsule (36,000 Units total) 3 (three) times daily before meals by mouth. 05/05/17  Yes Earlie Server, MD  NOVOLOG FLEXPEN 100 UNIT/ML FlexPen Inject 48 Units into the skin 3 (three) times daily with meals.  05/16/15  Yes [provider]  Vitamins/Minerals TABS Take 1 tablet every morning by mouth.  05/14/15  Yes [provider]  B-D UF III MINI PEN NEEDLES 31G X 5 MM MISC  10/17/16   [provider]  Blood Glucose Monitoring Suppl (GLUCOCOM BLOOD GLUCOSE MONITOR) DEVI use as directed 08/13/15   [provider]  diphenoxylate-atropine (LOMOTIL) 2.5-0.025 MG tablet take 1 tablet four times a day if needed 01/01/17   [provider]  lidocaine-prilocaine (EMLA) cream Apply over porta cath 1-2 hours prior to chemo. 05/26/17   Earlie Server, MD  ondansetron (ZOFRAN) 8 MG tablet Take 1 tablet (8 mg total) by mouth every 8 (eight) hours as needed for nausea or vomiting. 05/26/17   Earlie Server, MD  Oxycodone HCl 10 MG TABS Take 1 tablet (10 mg total) every 6 (six) hours as needed by mouth. 05/18/17   Earlie Server, MD  prochlorperazine (COMPAZINE) 10 MG tablet Take 1 tablet (10 mg total) by mouth every 6 (six) hours as needed for nausea or vomiting. 05/26/17   Earlie Server, MD  RA ACETAMINOPHEN 325 MG tablet take 2 tablets by mouth every 6 hours 04/21/17   [provider]  traZODone  (DESYREL) 50 MG tablet take 1 tablet by mouth at bedtime TO HELP WITH SLEEP-PRN 12/05/16   [provider]    Current medications: Acetaminophen Enoxaparin 40 mg subcu every 24 hours Folic acid 1 mg oral daily Thiamine 100 mg p.o. daily Insulin sliding scale Ativan per CIWA protocol Morphine 2 mg IV every 4 hours as needed pain Vancomycin Normal saline with 20 of KCl at 75 mL's per hour   Allergies  Allergen Reactions  . Aspirin Itching and Other (See Comments)    Reaction: abdominal pain     FAMILY HISTORY   Family History  Problem Relation Age of Onset  . Cancer - Colon Father   . Colon cancer Brother       SOCIAL HISTORY    reports that he has been smoking cigarettes.  He has a 10.25 pack-year smoking history. he has never used smokeless tobacco. He reports that he drinks  alcohol. He reports that he does not use drugs.  ROS Unable to obtain given the patient's condition   VITAL SIGNS    Temp:  [97.6 F (36.4 C)-99.6 F (37.6 C)] 97.6 F (36.4 C) (11/30 1630) Pulse Rate:  [68-110] 79 (11/30 1604) Resp:  [15-31] 25 (11/30 1604) BP: (70-135)/(44-83) 101/75 (11/30 1630) SpO2:  [96 %-100 %] 100 % (11/30 1604) Weight:  [105 lb (47.6 kg)-123 lb 7.3 oz (56 kg)] 123 lb 7.3 oz (56 kg) (11/30 1630) HEMODYNAMICS:   VENTILATOR SETTINGS: Room air   INTAKE / OUTPUT:  Intake/Output Summary (Last 24 hours) at 05/29/2017 1649 Last data filed at 05/29/2017 1630 Gross per 24 hour  Intake 5648.75 ml  Output 2 ml  Net 5646.75 ml       PHYSICAL EXAM   Physical Exam  Somnolent but easily arousable to vocal commands.   Following commands. Sclera anicteric bilaterally. CVS S1, S2, 0. Chest is clear to auscultation bilaterally with no rales rhonchi or wheezes. Abdomen is soft, nontender, nondistended.  Bowel sounds are positive. Right chest incision from the site of port removal covered with dressing.    LABS   LABS:  CBC Recent Labs  Lab  05/25/17 1209 05/28/17 1820 05/29/17 0352  WBC 12.3* 23.7* 18.3*  HGB 10.5* 9.4* 8.1*  HCT 31.3* 28.5* 24.5*  PLT 193 172 156   Coag's Recent Labs  Lab 05/28/17 1820  INR 1.11   BMET Recent Labs  Lab 05/25/17 1209 05/28/17 1820 05/29/17 0352  NA 132* 138 139  K 3.1* 3.3* 2.7*  CL 93* 100* 107  CO2 27 28 25   BUN <5* 8 7  CREATININE 0.83 0.65 0.56*  GLUCOSE 695* 98 106*   Electrolytes Recent Labs  Lab 05/25/17 1209 05/28/17 1820 05/29/17 0352  CALCIUM 9.2 8.8* 7.9*   Sepsis Markers Recent Labs  Lab 05/28/17 1820 05/28/17 2018 05/28/17 2322 05/29/17 0352  LATICACIDVEN 3.0* 1.2 1.5  --   PROCALCITON  --   --   --  1.31   ABG No results for input(s): PHART, PCO2ART, PO2ART in the last 168 hours. Liver Enzymes Recent Labs  Lab 05/25/17 1058 05/28/17 1820 05/29/17 0352  AST 91* 23 23  ALT 58 20 16*  ALKPHOS 170* 142* 125  BILITOT 0.5 0.3 0.5  ALBUMIN 3.4* 2.7* 2.1*   Cardiac Enzymes Recent Labs  Lab 05/25/17 1336 05/28/17 1820  TROPONINI <0.03 0.07*   Glucose Recent Labs  Lab 05/28/17 2032 05/29/17 0021 05/29/17 0600 05/29/17 1131 05/29/17 1336 05/29/17 1621  GLUCAP 144* 231* 73 115* 134* 107*     Recent Results (from the past 240 hour(s))  Culture, blood (Routine x 2)     Status: None (Preliminary result)   Collection Time: 05/28/17  6:21 PM  Result Value Ref Range Status   Specimen Description BLOOD BLOOD LEFT ARM  Final   Special Requests   Final    BOTTLES DRAWN AEROBIC AND ANAEROBIC Blood Culture adequate volume   Culture  Setup Time   Final    Organism ID to follow GRAM POSITIVE COCCI IN BOTH AEROBIC AND ANAEROBIC BOTTLES CRITICAL RESULT CALLED TO, READ BACK BY AND VERIFIED WITH: KRISTIN MERRILL AT 2633 05/29/17 [place]    Culture GRAM POSITIVE COCCI  Final   Report Status PENDING  Incomplete  Blood Culture ID Panel (Reflexed)     Status: Abnormal   Collection Time: 05/28/17  6:21 PM  Result Value Ref Range Status    Enterococcus species NOT  DETECTED NOT DETECTED Final   Listeria monocytogenes NOT DETECTED NOT DETECTED Final   Staphylococcus species DETECTED (A) NOT DETECTED Final    Comment: CRITICAL RESULT CALLED TO, READ BACK BY AND VERIFIED WITH:  KRISTIN MERRILL AT 1610 05/29/17 SDR    Staphylococcus aureus DETECTED (A) NOT DETECTED Final    Comment: Methicillin (oxacillin)-resistant Staphylococcus aureus (MRSA). MRSA is predictably resistant to beta-lactam antibiotics (except ceftaroline). Preferred therapy is vancomycin unless clinically contraindicated. Patient requires contact precautions if  hospitalized. CRITICAL RESULT CALLED TO, READ BACK BY AND VERIFIED WITH:  KRISTIN MERRILL AT 9604 05/29/17 SDR    Methicillin resistance DETECTED (A) NOT DETECTED Final    Comment: CRITICAL RESULT CALLED TO, READ BACK BY AND VERIFIED WITH:  KRISTIN MERRILL AT 5409 05/29/17 SDR    Streptococcus species NOT DETECTED NOT DETECTED Final   Streptococcus agalactiae NOT DETECTED NOT DETECTED Final   Streptococcus pneumoniae NOT DETECTED NOT DETECTED Final   Streptococcus pyogenes NOT DETECTED NOT DETECTED Final   Acinetobacter baumannii NOT DETECTED NOT DETECTED Final   Enterobacteriaceae species NOT DETECTED NOT DETECTED Final   Enterobacter cloacae complex NOT DETECTED NOT DETECTED Final   Escherichia coli NOT DETECTED NOT DETECTED Final   Klebsiella oxytoca NOT DETECTED NOT DETECTED Final   Klebsiella pneumoniae NOT DETECTED NOT DETECTED Final   Proteus species NOT DETECTED NOT DETECTED Final   Serratia marcescens NOT DETECTED NOT DETECTED Final   Haemophilus influenzae NOT DETECTED NOT DETECTED Final   Neisseria meningitidis NOT DETECTED NOT DETECTED Final   Pseudomonas aeruginosa NOT DETECTED NOT DETECTED Final   Candida albicans NOT DETECTED NOT DETECTED Final   Candida glabrata NOT DETECTED NOT DETECTED Final   Candida krusei NOT DETECTED NOT DETECTED Final   Candida parapsilosis NOT DETECTED NOT  DETECTED Final   Candida tropicalis NOT DETECTED NOT DETECTED Final  Culture, blood (Routine x 2)     Status: None (Preliminary result)   Collection Time: 05/28/17  7:10 PM  Result Value Ref Range Status   Specimen Description BLOOD LEFT ANTECUBITAL  Final   Special Requests   Final    BOTTLES DRAWN AEROBIC AND ANAEROBIC Blood Culture adequate volume   Culture  Setup Time   Final    GRAM POSITIVE COCCI IN BOTH AEROBIC AND ANAEROBIC BOTTLES CRITICAL VALUE NOTED.  VALUE IS CONSISTENT WITH PREVIOUSLY REPORTED AND CALLED VALUE.    Culture GRAM POSITIVE COCCI  Final   Report Status PENDING  Incomplete  Surgical pcr screen     Status: Abnormal   Collection Time: 05/28/17 10:44 PM  Result Value Ref Range Status   MRSA, PCR POSITIVE (A) NEGATIVE Final    Comment: RESULT CALLED TO, READ BACK BY AND VERIFIED WITH: MAT PAGE ON 05/29/17 AT 8119 BY JAG    Staphylococcus aureus POSITIVE (A) NEGATIVE Final    Comment: RESULT CALLED TO, READ BACK BY AND VERIFIED WITH: MAT PAGE ON 05/29/17 AT 0213 BY JAG (NOTE) The Xpert SA Assay (FDA approved for NASAL specimens in patients 35 years of age and older), is one component of a comprehensive surveillance program. It is not intended to diagnose infection nor to guide or monitor treatment.      Current Facility-Administered Medications:  .  0.9 % NaCl with KCl 20 mEq/ L  infusion, , Intravenous, Continuous, Nettie Elm, MD, Last Rate: 75 mL/hr at 05/29/17 1630 .  acetaminophen (TYLENOL) tablet 650 mg, 650 mg, Oral, Q6H PRN **OR** acetaminophen (TYLENOL) suppository 650 mg, 650 mg, Rectal, Q6H PRN,  Lance Coon, MD .  enoxaparin (LOVENOX) injection 40 mg, 40 mg, Subcutaneous, Q24H, Lance Coon, MD .  folic acid (FOLVITE) tablet 1 mg, 1 mg, Oral, Daily, Lance Coon, MD .  insulin aspart (novoLOG) injection 0-9 Units, 0-9 Units, Subcutaneous, Q6H, Lance Coon, MD, 2 Units at 05/29/17 0148 .  LORazepam (ATIVAN) injection 0-4 mg, 0-4 mg,  Intravenous, Q6H, 2 mg at 05/29/17 1130 **FOLLOWED BY** [START ON 05/30/2017] LORazepam (ATIVAN) injection 0-4 mg, 0-4 mg, Intravenous, Q12H, Lance Coon, MD .  LORazepam (ATIVAN) tablet 1 mg, 1 mg, Oral, Q6H PRN **OR** LORazepam (ATIVAN) injection 1 mg, 1 mg, Intravenous, Q6H PRN, Lance Coon, MD .  morphine 2 MG/ML injection 2 mg, 2 mg, Intravenous, Q4H PRN, Lance Coon, MD .  multivitamin with minerals tablet 1 tablet, 1 tablet, Oral, Daily, Lance Coon, MD .  ondansetron (ZOFRAN) tablet 4 mg, 4 mg, Oral, Q6H PRN **OR** ondansetron (ZOFRAN) injection 4 mg, 4 mg, Intravenous, Q6H PRN, Lance Coon, MD .  pantoprazole (PROTONIX) injection 40 mg, 40 mg, Intravenous, Q24H, Nettie Elm, MD .  thiamine (VITAMIN B-1) tablet 100 mg, 100 mg, Oral, Daily **OR** thiamine (B-1) injection 100 mg, 100 mg, Intravenous, Daily, Lance Coon, MD .  vancomycin (VANCOCIN) IVPB 1000 mg/200 mL premix, 1,000 mg, Intravenous, Q18H, Merlyn Lot, MD, Stopped at 05/29/17 1513  IMAGING    Ct Head Wo Contrast  Result Date: 05/28/2017 CLINICAL DATA:  Initial evaluation for acute altered mental status, increased confusion. History of prostate cancer. EXAM: CT HEAD WITHOUT CONTRAST TECHNIQUE: Contiguous axial images were obtained from the base of the skull through the vertex without intravenous contrast. COMPARISON:  Prior CT from 11/30/2016. FINDINGS: Brain: Stable atrophy with chronic small vessel ischemic disease. No acute intracranial hemorrhage. No acute large vessel territory infarct. No mass lesion, midline shift or mass effect. No hydrocephalus. No extra-axial fluid collection. Vascular: No hyperdense vessel. Scattered vascular calcifications noted within the carotid siphons. Skull: Scalp soft tissues and calvarium within normal limits. Sinuses/Orbits: Globes and orbital soft tissues normal. Paranasal sinuses and mastoid air cells are clear. Other: None. IMPRESSION: 1. No acute intracranial abnormality. 2.  Stable atrophy with chronic small vessel ischemic disease. Electronically Signed   By: Jeannine Boga M.D.   On: 05/28/2017 18:21   Dg Chest Portable 1 View  Result Date: 05/28/2017 CLINICAL DATA:  Altered mental status. Prostate cancer and pancreatitis. EXAM: PORTABLE CHEST 1 VIEW COMPARISON:  05/25/2017 FINDINGS: Right Port-A-Cath terminates at the mid SVC. Midline trachea. Normal heart size and mediastinal contours. No pleural effusion or pneumothorax. Remote bilateral rib fractures. IMPRESSION: No acute cardiopulmonary disease. Electronically Signed   By: Abigail Miyamoto M.D.   On: 05/28/2017 17:39       MICRO DATA: MRSA PCR positive Urine  Blood gram-positive cocci Resp     ASSESSMENT/PLAN    56 years old gentleman with past medical history significant for sigmoid carcinoma, anemia, BPH, diabetes, alcohol abuse, GERD, GI bleed who was admitted to the hospital yesterday with altered mental status and sepsis from infected port.  He also had gram-positive bacteremia  Problem list  Severe sepsis with shock from infected port.  Status post port removal 04/3010 2018 Gram-positive bacteremia Ongoing tobacco abuse Ongoing alcohol abuse Diabetes mellitus Hypokalemia History of pancreatic insufficiency History of anemia Sigmoid carcinoma  Patient has been transferred to the SDU/ICU per primary team. On arrival to the ICU the patient is hemodynamically stable with systolic blood pressure in the low 100s. He has received 1 L  of saline bolus in the PACU. He is currently on normal saline with 20 KCl at 75 mL's per hour.  Will increase the IV fluid rate to 150 mL's per hour. Given the patient's normal systolic blood pressure and mean arterial pressure at this time, he does not need vasopressors. Discussed with Dr. Ola Spurr (ID).  Per their request, as currently the patient blood pressure is fine, we will try to avoid placing the central line if possible to give him a line holiday.   However, if he becomes hypotensive requiring vasopressors, the patient will need central IV access. We will give another saline bolus if the patient becomes hypotensive. Continue vancomycin per infectious diseases. Echocardiogram as ordered on the floor. Check pro calcitonin in the morning  Repeat stat CBC, CMP, magnesium, phosphorus, I cal. Check lactic acid Replace electrolytes as needed.  Continue thiamine and folic acid Continue CIWA protocol  Monitor daily CBC.  Transfuse for hemoglobin less than 7  Insulin sliding scale for glycemic control Enoxaparin for DVT prophylaxis Start Protonix for stress ulcer prophylaxis   Total critical care time spent in evaluating the patient and addressing his issues is 45 minutes   I have personally obtained a history, examined the patient, evaluated laboratory and independently reviewed  imaging results, formulated the assessment and plan and placed orders.  The Patient requires high complexity decision making for assessment and support, frequent evaluation and titration of therapies, and extensive interpretation of multiple databases. Critical Care Time devoted to patient care services described in this note is 45 minutes.      Nettie Elm, M.D.  Pulmonary and Critical Care Medicine

## 2017-05-29 NOTE — Interval H&P Note (Signed)
History and Physical Interval Note:  05/29/2017 8:55 AM  Denton Lank  has presented today for surgery, with the diagnosis of infected port site  The various methods of treatment have been discussed with the patient and family. After consideration of risks, benefits and other options for treatment, the patient has consented to  Procedure(s): REMOVAL PORT-A-CATH (N/A) as a surgical intervention.  The patient's history has been reviewed and patient was examined. Patient is much more awake and alert this morning than was described overnight at time of initial assessment and admission. I have reviewed the patient's chart and labs. Patient acknowledges he is in the hospital for infection and agrees to proceed with removal of his infected port. Questions were answered to the patient's satisfaction.  Of note, patient's long-time girlfriend, who has been present for all of at least patient's surgical appointments over the past six months, and has been consistently forthcoming with insights and information regarding patient, describes repeatedly telling patient not to scratch and pick at his wound, which he says he did because it "hurt" and "itched". Picking and scratching at his port insertion site was also reportedly witnessed and reported by his child. Patient's girlfriend also states they were prescribed antibiotics for port site erythema when they presented and were evaluated at [facility] ED for hyperglycemia 3 days prior to current presentation, but she states they were unable to fill the prescription for his prescribed antibiotics. This, along with patient's chronic hyperglycemia, is consistent with patient's unfortunate port site infection 10 days following insertion.  Vickie Epley

## 2017-05-29 NOTE — Anesthesia Post-op Follow-up Note (Signed)
Anesthesia QCDR form completed.        

## 2017-05-29 NOTE — Progress Notes (Signed)
Patient ID: [name], male   DOB: 12-31-1960, 56 y.o.   MRN: [id number]  [employer] PROGRESS NOTE  [name] DOB: 12/29/1960 DOA: 05/28/2017 PCP: [name], MD  HPI/Subjective: Patient seen this morning prior to going to the operating room.  He opened his eyes and said hello but went back to sleep.  He did not talk.  As per his girlfriend, she states that he gets this way when he sick.  I was called postoperatively that he is hypotensive despite a fluid bolus.  Objective: Vitals:   05/29/17 1454 05/29/17 1505  BP: (!) 84/60 (!) 84/62  Pulse: 74 70  Resp: (!) 21 (!) 22  Temp:    SpO2: 100% 98%    Intake/Output Summary (Last 24 hours) at 05/29/2017 1522 Last data filed at 05/29/2017 1347 Gross per 24 hour  Intake 4825 ml  Output 2 ml  Net 4823 ml   Filed Weights   05/28/17 1720  Weight: 47.6 kg (105 lb)    ROS: Review of Systems  Unable to perform ROS: Acuity of condition   Exam: Physical Exam  Constitutional: He appears lethargic.  HENT:  Nose: No mucosal edema.  Mouth/Throat: No oropharyngeal exudate or posterior oropharyngeal edema.  Eyes: Conjunctivae and lids are normal. Pupils are equal, round, and reactive to light.  Neck: No JVD present. Carotid bruit is not present. No edema present. No thyroid mass and no thyromegaly present.  Cardiovascular: S1 normal and S2 normal. Exam reveals no gallop.  No murmur heard. Pulses:      Dorsalis pedis pulses are 2+ on the right side, and 2+ on the left side.  Respiratory: No respiratory distress. He has no wheezes. He has no rhonchi. He has no rales.  GI: Soft. Bowel sounds are normal. There is no tenderness.  Musculoskeletal:       Right ankle: He exhibits no swelling.       Left ankle: He exhibits no swelling.  Lymphadenopathy:    He has no cervical adenopathy.  Neurological: He appears lethargic.  Skin: Skin is warm. Nails show no clubbing.  Dry skin lower extremities.  Area of  pus right port site  Psychiatric:  Difficult to assess since he did not talk with me      Data Reviewed: Basic Metabolic Panel: Recent Labs  Lab 05/25/17 1058 05/25/17 1209 05/28/17 1820 05/29/17 0352  NA 131* 132* 138 139  K 3.2* 3.1* 3.3* 2.7*  CL 93* 93* 100* 107  CO2 29 27 28 25   GLUCOSE 669* 695* 98 106*  BUN <5* <5* 8 7  CREATININE 0.76 0.83 0.65 0.56*  CALCIUM 9.1 9.2 8.8* 7.9*   Liver Function Tests: Recent Labs  Lab 05/25/17 1058 05/28/17 1820 05/29/17 0352  AST 91* 23 23  ALT 58 20 16*  ALKPHOS 170* 142* 125  BILITOT 0.5 0.3 0.5  PROT 7.2 6.3* 5.2*  ALBUMIN 3.4* 2.7* 2.1*   CBC: Recent Labs  Lab 05/25/17 1058 05/25/17 1209 05/28/17 1820 05/29/17 0352  WBC 10.9* 12.3* 23.7* 18.3*  NEUTROABS 8.7*  --  20.6*  --   HGB 9.9* 10.5* 9.4* 8.1*  HCT 29.7* 31.3* 28.5* 24.5*  MCV 93.7 94.2 93.2 93.6  PLT 205 193 172 156   Cardiac Enzymes: Recent Labs  Lab 05/25/17 1336 05/28/17 1820  TROPONINI <0.03 0.07*    CBG: Recent Labs  Lab 05/28/17 2032 05/29/17 0021 05/29/17 0600 05/29/17 1131 05/29/17 1336  GLUCAP 144* 231* 73 115*  134*    Recent Results (from the past 240 hour(s))  Culture, blood (Routine x 2)     Status: None (Preliminary result)   Collection Time: 05/28/17  6:21 PM  Result Value Ref Range Status   Specimen Description BLOOD BLOOD LEFT ARM  Final   Special Requests   Final    BOTTLES DRAWN AEROBIC AND ANAEROBIC Blood Culture adequate volume   Culture  Setup Time   Final    Organism ID to follow GRAM POSITIVE COCCI AEROBIC BOTTLE ONLY CRITICAL RESULT CALLED TO, READ BACK BY AND VERIFIED WITH: KRISTIN MERRILL AT 7169 05/29/17 [place]    Culture GRAM POSITIVE COCCI  Final   Report Status PENDING  Incomplete  Blood Culture ID Panel (Reflexed)     Status: Abnormal   Collection Time: 05/28/17  6:21 PM  Result Value Ref Range Status   Enterococcus species NOT DETECTED NOT DETECTED Final   Listeria monocytogenes NOT DETECTED NOT  DETECTED Final   Staphylococcus species DETECTED (A) NOT DETECTED Final    Comment: CRITICAL RESULT CALLED TO, READ BACK BY AND VERIFIED WITH:  KRISTIN MERRILL AT 6789 05/29/17 SDR    Staphylococcus aureus DETECTED (A) NOT DETECTED Final    Comment: Methicillin (oxacillin)-resistant Staphylococcus aureus (MRSA). MRSA is predictably resistant to beta-lactam antibiotics (except ceftaroline). Preferred therapy is vancomycin unless clinically contraindicated. Patient requires contact precautions if  hospitalized. CRITICAL RESULT CALLED TO, READ BACK BY AND VERIFIED WITH:  KRISTIN MERRILL AT 3810 05/29/17 SDR    Methicillin resistance DETECTED (A) NOT DETECTED Final    Comment: CRITICAL RESULT CALLED TO, READ BACK BY AND VERIFIED WITH:  KRISTIN MERRILL AT 1751 05/29/17 SDR    Streptococcus species NOT DETECTED NOT DETECTED Final   Streptococcus agalactiae NOT DETECTED NOT DETECTED Final   Streptococcus pneumoniae NOT DETECTED NOT DETECTED Final   Streptococcus pyogenes NOT DETECTED NOT DETECTED Final   Acinetobacter baumannii NOT DETECTED NOT DETECTED Final   Enterobacteriaceae species NOT DETECTED NOT DETECTED Final   Enterobacter cloacae complex NOT DETECTED NOT DETECTED Final   Escherichia coli NOT DETECTED NOT DETECTED Final   Klebsiella oxytoca NOT DETECTED NOT DETECTED Final   Klebsiella pneumoniae NOT DETECTED NOT DETECTED Final   Proteus species NOT DETECTED NOT DETECTED Final   Serratia marcescens NOT DETECTED NOT DETECTED Final   Haemophilus influenzae NOT DETECTED NOT DETECTED Final   Neisseria meningitidis NOT DETECTED NOT DETECTED Final   Pseudomonas aeruginosa NOT DETECTED NOT DETECTED Final   Candida albicans NOT DETECTED NOT DETECTED Final   Candida glabrata NOT DETECTED NOT DETECTED Final   Candida krusei NOT DETECTED NOT DETECTED Final   Candida parapsilosis NOT DETECTED NOT DETECTED Final   Candida tropicalis NOT DETECTED NOT DETECTED Final  Culture, blood (Routine x  2)     Status: None (Preliminary result)   Collection Time: 05/28/17  7:10 PM  Result Value Ref Range Status   Specimen Description BLOOD LEFT ANTECUBITAL  Final   Special Requests   Final    BOTTLES DRAWN AEROBIC AND ANAEROBIC Blood Culture adequate volume   Culture NO GROWTH < 12 HOURS  Final   Report Status PENDING  Incomplete  Surgical pcr screen     Status: Abnormal   Collection Time: 05/28/17 10:44 PM  Result Value Ref Range Status   MRSA, PCR POSITIVE (A) NEGATIVE Final    Comment: RESULT CALLED TO, READ BACK BY AND VERIFIED WITH: MAT PAGE ON 05/29/17 AT 0213 BY JAG    Staphylococcus aureus  POSITIVE (A) NEGATIVE Final    Comment: RESULT CALLED TO, READ BACK BY AND VERIFIED WITH: MAT PAGE ON 05/29/17 AT 0213 BY JAG (NOTE) The Xpert SA Assay (FDA approved for NASAL specimens in patients 63 years of age and older), is one component of a comprehensive surveillance program. It is not intended to diagnose infection nor to guide or monitor treatment.      Studies: Ct Head Wo Contrast  Result Date: 05/28/2017 CLINICAL DATA:  Initial evaluation for acute altered mental status, increased confusion. History of prostate cancer. EXAM: CT HEAD WITHOUT CONTRAST TECHNIQUE: Contiguous axial images were obtained from the base of the skull through the vertex without intravenous contrast. COMPARISON:  Prior CT from 11/30/2016. FINDINGS: Brain: Stable atrophy with chronic small vessel ischemic disease. No acute intracranial hemorrhage. No acute large vessel territory infarct. No mass lesion, midline shift or mass effect. No hydrocephalus. No extra-axial fluid collection. Vascular: No hyperdense vessel. Scattered vascular calcifications noted within the carotid siphons. Skull: Scalp soft tissues and calvarium within normal limits. Sinuses/Orbits: Globes and orbital soft tissues normal. Paranasal sinuses and mastoid air cells are clear. Other: None. IMPRESSION: 1. No acute intracranial abnormality. 2.  Stable atrophy with chronic small vessel ischemic disease. Electronically Signed   By: Jeannine Boga M.D.   On: 05/28/2017 18:21   Dg Chest Portable 1 View  Result Date: 05/28/2017 CLINICAL DATA:  Altered mental status. Prostate cancer and pancreatitis. EXAM: PORTABLE CHEST 1 VIEW COMPARISON:  05/25/2017 FINDINGS: Right Port-A-Cath terminates at the mid SVC. Midline trachea. Normal heart size and mediastinal contours. No pleural effusion or pneumothorax. Remote bilateral rib fractures. IMPRESSION: No acute cardiopulmonary disease. Electronically Signed   By: Abigail Miyamoto M.D.   On: 05/28/2017 17:39    Scheduled Meds: . Chlorhexidine Gluconate Cloth  6 each Topical Once   And  . Chlorhexidine Gluconate Cloth  6 each Topical Once  . [MAR Hold] enoxaparin (LOVENOX) injection  40 mg Subcutaneous Q24H  . [MAR Hold] folic acid  1 mg Oral Daily  . [MAR Hold] insulin aspart  0-9 Units Subcutaneous Q6H  . [MAR Hold] LORazepam  0-4 mg Intravenous Q6H   Followed by  . [MAR Hold] LORazepam  0-4 mg Intravenous Q12H  . [MAR Hold] multivitamin with minerals  1 tablet Oral Daily  . [MAR Hold] thiamine  100 mg Oral Daily   Or  . [MAR Hold] thiamine  100 mg Intravenous Daily   Continuous Infusions: . 0.9 % NaCl with KCl 20 mEq / L 75 mL/hr at 05/29/17 0902  . sodium chloride    . [MAR Hold] vancomycin Stopped (05/29/17 1513)    Assessment/Plan:  1. Sepsis with staph aureus and hypotension postoperatively after removing port.  Leukocytosis, tachycardia and lactic acidosis.  Continue IV vancomycin.  Give a second fluid bolus.  Transfer to CCU stepdown.  Case discussed with critical care specialist.  May end up needing pressors. 2. Hypokalemia replace potassium IV and orally.  IV magnesium also given. 3. Anemia.  We will get a stat hemoglobin to see if patient needs blood. 4. History of alcohol abuse on CIWA protocol 5. Type 2 diabetes on sliding scale. 6. Malignant neoplasm of sigmoid  colon 7. Moderate protein calorie malnutrition 8. History of pancreatic insufficiency  Code Status:     Code Status Orders  (From admission, onward)        Start     Ordered   05/28/17 2204  Full code  Continuous  05/28/17 2203    Code Status History    Date Active Date Inactive Code Status Order ID Comments User Context   12/01/2016 02:45 12/04/2016 16:34 Full Code [id number]  Lance Coon, MD Inpatient   05/12/2015 22:39 05/14/2015 17:23 Full Code [id number]  Lance Coon, MD Inpatient     Family Communication: Girlfriend at the bedside earlier Disposition Plan: Watch overnight in the CCU stepdown  Consultants:  General surgery  Critical care specialist  Procedures:  Port-A-Cath removal  Antibiotics:  IV vancomycin  Time spent: 28 minutes  [place]

## 2017-05-29 NOTE — Progress Notes (Addendum)
Initial Nutrition Assessment  DOCUMENTATION CODES:   Underweight  INTERVENTION:  1. Premier Protein BID, each supplement provides 160 calories and 30 grams of protein with diet advancement  2. Magic cup TID with meals, each supplement provides 290 kcal and 9 grams of protein with diet advancement  NUTRITION DIAGNOSIS:   Unintentional weight loss related to chronic illness, cancer and cancer related treatments as evidenced by percent weight loss.  GOAL:   Patient will meet greater than or equal to 90% of their needs  MONITOR:   PO intake, I & O's, Labs, Weight trends, Diet advancement  REASON FOR ASSESSMENT:   Malnutrition Screening Tool    ASSESSMENT:   [name]  is a 56 y.o. male  with history significant for advanced rectal cancer 1 month s/p lower anterior resection; RIJ Port-a-cath placement 11/19 Patient has long struggled with alcohol abuse, chronic pancreatitis, and brittle poorly controlled DM with baseline blood glucoses ~800's s/p distal pancreatectomy also for chronic alcohol-induced pancreatitis, presents with lethargy and increased confusion today. Possible infected port-a-cath.   Patient in surgery for infected Port-a-Cath during visit. No family available. Chart review completed. Patient was 130 pounds upon ED visit 07/2016, 110 pounds 11/2016, now 105 pounds. Patient exhibits a 25 pound/19.2% severe weight loss over 9 months. Underweight for BMI. Likely not meeting needs PTA for Stg III rectal cancer.   Labs reviewed:  K+ 2.7 Medications reviewed and include:  Folic Acid, Insulin, Thiamine, MVI w/ Minerals NS 20k+ at 38mL/hr  NUTRITION - FOCUSED PHYSICAL EXAM:  Unable to complete at this time  Diet Order:  Diet NPO time specified Except for: Sips with Meds  EDUCATION NEEDS:   Not appropriate for education at this time  Skin:  Skin Assessment: Skin Integrity Issues: Skin Integrity Issues:: Incisions Incisions: Closed incisions to R chest and  Neck  Last BM:  PTA  Height:   Ht Readings from Last 1 Encounters:  05/28/17 5\' 7"  (1.702 m)    Weight:   Wt Readings from Last 1 Encounters:  05/28/17 105 lb (47.6 kg)    Ideal Body Weight:  67.27 kg  BMI:  Body mass index is 16.45 kg/m.  Estimated Nutritional Needs:   Kcal:  1450-1670 calories  Protein:  72-81 grams (1.5-1.7g/kg)  Fluid:  1.5-1.7L  Satira Anis. Zen Felling, MS, RD LDN Inpatient Clinical Dietitian Pager [phone]

## 2017-05-30 ENCOUNTER — Inpatient Hospital Stay
Admit: 2017-05-30 | Discharge: 2017-05-30 | Disposition: A | Discharge status: Home / Self Care | Payer: Medicaid Other | Attending: Infectious Diseases | Admitting: Infectious Diseases

## 2017-05-30 DIAGNOSIS — A4101 Sepsis due to Methicillin susceptible Staphylococcus aureus: Secondary | ICD-10-CM

## 2017-05-30 LAB — CBC
HEMATOCRIT: 27 % — AB (ref 40.0–52.0)
HEMOGLOBIN: 8.8 g/dL — AB (ref 13.0–18.0)
MCH: 30.8 pg (ref 26.0–34.0)
MCHC: 32.7 g/dL (ref 32.0–36.0)
MCV: 94.2 fL (ref 80.0–100.0)
Platelets: 163 10*3/uL (ref 150–440)
RBC: 2.87 MIL/uL — ABNORMAL LOW (ref 4.40–5.90)
RDW: 15.1 % — AB (ref 11.5–14.5)
WBC: 12.1 10*3/uL — ABNORMAL HIGH (ref 3.8–10.6)

## 2017-05-30 LAB — BASIC METABOLIC PANEL
Anion gap: 5 (ref 5–15)
BUN: 7 mg/dL (ref 6–20)
CHLORIDE: 112 mmol/L — AB (ref 101–111)
CO2: 20 mmol/L — ABNORMAL LOW (ref 22–32)
Calcium: 7.5 mg/dL — ABNORMAL LOW (ref 8.9–10.3)
Creatinine, Ser: 0.41 mg/dL — ABNORMAL LOW (ref 0.61–1.24)
GFR calc Af Amer: >60 mL/min (ref >60)
GFR calc non Af Amer: >60 mL/min (ref >60)
GLUCOSE: 137 mg/dL — AB (ref 65–99)
POTASSIUM: 4 mmol/L (ref 3.5–5.1)
Sodium: 137 mmol/L (ref 135–145)

## 2017-05-30 LAB — CALCIUM, IONIZED: Calcium, Ionized, Serum: 4.7 mg/dL (ref 4.5–5.6)

## 2017-05-30 LAB — GLUCOSE, CAPILLARY
Glucose-Capillary: 113 mg/dL — ABNORMAL HIGH (ref 65–99)
Glucose-Capillary: 131 mg/dL — ABNORMAL HIGH (ref 65–99)

## 2017-05-30 LAB — PHOSPHORUS: Phosphorus: 2.8 mg/dL (ref 2.5–4.6)

## 2017-05-30 LAB — PROCALCITONIN: Procalcitonin: 0.92 ng/mL

## 2017-05-30 LAB — MAGNESIUM: Magnesium: 2 mg/dL (ref 1.7–2.4)

## 2017-05-30 MED ORDER — VANCOMYCIN HCL IN DEXTROSE 750-5 MG/150ML-% IV SOLN
750.0000 mg | Freq: Two times a day (BID) | INTRAVENOUS | Status: DC
  Administered 2017-05-30 – 2017-06-01 (×4): 750 mg via INTRAVENOUS
  Filled 2017-05-30 (×6): qty 150

## 2017-05-30 MED ORDER — SODIUM CHLORIDE 0.9 % IV SOLN
INTRAVENOUS | Status: DC
  Administered 2017-05-31: 02:00:00 via INTRAVENOUS
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
## 2017-05-30 NOTE — Progress Notes (Signed)
[facility] Consultation     Date: 05/30/2017,   MRN# [id number] [name] Jul 30, 1960 Code Status:     Code Status Orders  (From admission, onward)        Start     Ordered   05/28/17 2204  Full code  Continuous     05/28/17 2203    Code Status History    Date Active Date Inactive Code Status Order ID Comments User Context   12/01/2016 02:45 12/04/2016 16:34 Full Code [id number]  Lance Coon, MD Inpatient   05/12/2015 22:39 05/14/2015 17:23 Full Code [id number]  Lance Coon, MD Inpatient     Hosp day:@LENGTHOFSTAYDAYS @ Referring MD: @ATDPROV @     PCP:      AdmissionWeight: 105 lb (47.6 kg)                 CurrentWeight: 130 lb 1.1 oz (59 kg) [name] is a 56 y.o. old male      SUBJECTIVE:   No acute issues overnight.  Has remained hemodynamically stable.  Systolic blood pressure has been in the 90s with mean arterial pressure has been more than 65 consistently. Patient did not require vasopressors and responded to IV fluids. Urine output is adequate.  Cumulatively 4.5 L positive. Leukocytosis and pro calcitonin improving. Still confused but much more awake and responsive today. Blood cultures have speciated to MRSA.   overnight.   MEDICATIONS    Current Medication:   Current Facility-Administered Medications:  .  0.9 %  sodium chloride infusion, , Intravenous, Continuous, Nettie Elm, MD, Last Rate: 150 mL/hr at 05/30/17 0808, 150 mL/hr at 05/30/17 0808 .  acetaminophen (TYLENOL) tablet 650 mg, 650 mg, Oral, Q6H PRN **OR** acetaminophen (TYLENOL) suppository 650 mg, 650 mg, Rectal, Q6H PRN, Lance Coon, MD .  enoxaparin (LOVENOX) injection 40 mg, 40 mg, Subcutaneous, Q24H, Lance Coon, MD .  folic acid (FOLVITE) tablet 1 mg, 1 mg, Oral, Daily, Lance Coon, MD .  insulin aspart (novoLOG) injection 0-9 Units, 0-9 Units, Subcutaneous, Q6H, Lance Coon, MD, 1 Units at 05/30/17 0029 .  LORazepam (ATIVAN) injection 0-4 mg, 0-4 mg,  Intravenous, Q6H, 4 mg at 05/30/17 0943 **FOLLOWED BY** LORazepam (ATIVAN) injection 0-4 mg, 0-4 mg, Intravenous, Q12H, Lance Coon, MD .  LORazepam (ATIVAN) tablet 1 mg, 1 mg, Oral, Q6H PRN **OR** LORazepam (ATIVAN) injection 1 mg, 1 mg, Intravenous, Q6H PRN, Lance Coon, MD, 1 mg at 05/30/17 0249 .  morphine 2 MG/ML injection 2 mg, 2 mg, Intravenous, Q4H PRN, Lance Coon, MD .  multivitamin with minerals tablet 1 tablet, 1 tablet, Oral, Daily, Lance Coon, MD .  ondansetron [facility]) tablet 4 mg, 4 mg, Oral, Q6H PRN **OR** ondansetron (ZOFRAN) injection 4 mg, 4 mg, Intravenous, Q6H PRN, Lance Coon, MD .  pantoprazole (PROTONIX) injection 40 mg, 40 mg, Intravenous, Q24H, Nettie Elm, MD, 40 mg at 05/29/17 1721 .  thiamine (VITAMIN B-1) tablet 100 mg, 100 mg, Oral, Daily **OR** thiamine (B-1) injection 100 mg, 100 mg, Intravenous, Daily, Lance Coon, MD, 100 mg at 05/30/17 0944 .  vancomycin (VANCOCIN) IVPB 750 mg/150 ml premix, 750 mg, Intravenous, Q12H, Rocky Morel, RPH     REVIEW OF SYSTEMS    VS: BP 97/66 (BP Location: Right Arm)   Pulse 60   Temp 98.1 F (36.7 C) (Oral)   Resp 16   Ht 5\' 8"  (1.727 m)   Wt 130 lb 1.1 oz (59 kg)   SpO2 100%   BMI 19.78 kg/m  PHYSICAL EXAM   Physical Exam   Awake, follows commands. Confused.. Sclera anicteric bilaterally. CVS S1, S2, 0. Chest is clear to auscultation bilaterally with no rales rhonchi or wheezes. Abdomen is soft, nontender, nondistended.   Right chest incision from the site of port removal covered with dressing.        LABS    Recent Labs    05/28/17 1820 05/29/17 0352 05/29/17 1806 05/30/17 0525  HGB 9.4* 8.1* 9.1* 8.8*  HCT 28.5* 24.5* 27.0* 27.0*  MCV 93.2 93.6 93.5 94.2  WBC 23.7* 18.3* 14.6* 12.1*  BUN 8 7 6 7   CREATININE 0.65 0.56* 0.53* 0.41*  GLUCOSE 98 106* 123* 137*  CALCIUM 8.8* 7.9* 7.6* 7.5*  INR 1.11  --   --   --   ,    No results for input(s): PH in the last 72  hours.  Invalid input(s): PCO2, PO2, BASEEXCESS, BASEDEFICITE, TFT    CULTURE RESULTS   Recent Results (from the past 240 hour(s))  Culture, blood (Routine x 2)     Status: Abnormal (Preliminary result)   Collection Time: 05/28/17  6:21 PM  Result Value Ref Range Status   Specimen Description BLOOD BLOOD LEFT ARM  Final   Special Requests   Final    BOTTLES DRAWN AEROBIC AND ANAEROBIC Blood Culture adequate volume   Culture  Setup Time   Final    GRAM POSITIVE COCCI IN BOTH AEROBIC AND ANAEROBIC BOTTLES CRITICAL RESULT CALLED TO, READ BACK BY AND VERIFIED WITH: KRISTIN MERRILL AT 5732 05/29/17 SDR    Culture (A)  Final    STAPHYLOCOCCUS AUREUS SUSCEPTIBILITIES TO FOLLOW Performed at [facility], [place] [address], [place], [place] [address]    Report Status PENDING  Incomplete  Blood Culture ID Panel (Reflexed)     Status: Abnormal   Collection Time: 05/28/17  6:21 PM  Result Value Ref Range Status   Enterococcus species NOT DETECTED NOT DETECTED Final   Listeria monocytogenes NOT DETECTED NOT DETECTED Final   Staphylococcus species DETECTED (A) NOT DETECTED Final    Comment: CRITICAL RESULT CALLED TO, READ BACK BY AND VERIFIED WITH:  KRISTIN MERRILL AT 2706 05/29/17 SDR    Staphylococcus aureus DETECTED (A) NOT DETECTED Final    Comment: Methicillin (oxacillin)-resistant Staphylococcus aureus (MRSA). MRSA is predictably resistant to beta-lactam antibiotics (except ceftaroline). Preferred therapy is vancomycin unless clinically contraindicated. Patient requires contact precautions if  hospitalized. CRITICAL RESULT CALLED TO, READ BACK BY AND VERIFIED WITH:  KRISTIN MERRILL AT 2376 05/29/17 SDR    Methicillin resistance DETECTED (A) NOT DETECTED Final    Comment: CRITICAL RESULT CALLED TO, READ BACK BY AND VERIFIED WITH:  KRISTIN MERRILL AT 2831 05/29/17 SDR    Streptococcus species NOT DETECTED NOT DETECTED Final   Streptococcus agalactiae NOT DETECTED NOT DETECTED  Final   Streptococcus pneumoniae NOT DETECTED NOT DETECTED Final   Streptococcus pyogenes NOT DETECTED NOT DETECTED Final   Acinetobacter baumannii NOT DETECTED NOT DETECTED Final   Enterobacteriaceae species NOT DETECTED NOT DETECTED Final   Enterobacter cloacae complex NOT DETECTED NOT DETECTED Final   Escherichia coli NOT DETECTED NOT DETECTED Final   Klebsiella oxytoca NOT DETECTED NOT DETECTED Final   Klebsiella pneumoniae NOT DETECTED NOT DETECTED Final   Proteus species NOT DETECTED NOT DETECTED Final   Serratia marcescens NOT DETECTED NOT DETECTED Final   Haemophilus influenzae NOT DETECTED NOT DETECTED Final   Neisseria meningitidis NOT DETECTED NOT DETECTED Final   Pseudomonas aeruginosa NOT  DETECTED NOT DETECTED Final   Candida albicans NOT DETECTED NOT DETECTED Final   Candida glabrata NOT DETECTED NOT DETECTED Final   Candida krusei NOT DETECTED NOT DETECTED Final   Candida parapsilosis NOT DETECTED NOT DETECTED Final   Candida tropicalis NOT DETECTED NOT DETECTED Final  Culture, blood (Routine x 2)     Status: Abnormal (Preliminary result)   Collection Time: 05/28/17  7:10 PM  Result Value Ref Range Status   Specimen Description BLOOD LEFT ANTECUBITAL  Final   Special Requests   Final    BOTTLES DRAWN AEROBIC AND ANAEROBIC Blood Culture adequate volume   Culture  Setup Time   Final    GRAM POSITIVE COCCI IN BOTH AEROBIC AND ANAEROBIC BOTTLES CRITICAL VALUE NOTED.  VALUE IS CONSISTENT WITH PREVIOUSLY REPORTED AND CALLED VALUE.    Culture STAPHYLOCOCCUS AUREUS (A)  Final   Report Status PENDING  Incomplete  Surgical pcr screen     Status: Abnormal   Collection Time: 05/28/17 10:44 PM  Result Value Ref Range Status   MRSA, PCR POSITIVE (A) NEGATIVE Final    Comment: RESULT CALLED TO, READ BACK BY AND VERIFIED WITH: MAT PAGE ON 05/29/17 AT 4193 BY JAG    Staphylococcus aureus POSITIVE (A) NEGATIVE Final    Comment: RESULT CALLED TO, READ BACK BY AND VERIFIED  WITH: MAT PAGE ON 05/29/17 AT 0213 BY JAG (NOTE) The Xpert SA Assay (FDA approved for NASAL specimens in patients 72 years of age and older), is one component of a comprehensive surveillance program. It is not intended to diagnose infection nor to guide or monitor treatment.   Aerobic/Anaerobic Culture (surgical/deep wound)     Status: None (Preliminary result)   Collection Time: 05/29/17  1:31 PM  Result Value Ref Range Status   Specimen Description WOUND RIGHT UPPER CHEST  Final   Special Requests NONE  Final   Gram Stain   Final    FEW WBC PRESENT, PREDOMINANTLY PMN FEW GRAM POSITIVE COCCI IN CLUSTERS Performed at [facility], [place] [address] [place], [place] [id number]    Culture PENDING  Incomplete   Report Status PENDING  Incomplete  Culture, blood (single) w Reflex to ID Panel     Status: None (Preliminary result)   Collection Time: 05/29/17  6:06 PM  Result Value Ref Range Status   Specimen Description BLOOD LEFT ASSIST CONTROL  Final   Special Requests   Final    BOTTLES DRAWN AEROBIC AND ANAEROBIC Blood Culture adequate volume   Culture NO GROWTH < 12 HOURS  Final   Report Status PENDING  Incomplete          IMAGING    No results found.       ASSESSMENT/PLAN    56 years old gentleman with past medical history significant for sigmoid carcinoma, anemia, BPH, diabetes, alcohol abuse, GERD, GI bleed who was admitted to the hospital yesterday with altered mental status and sepsis from infected port.  He also had gram-positive bacteremia  Problem list  Severe sepsis with shock from infected port.  Status post port removal 04/3010 2018 MRSA bacteremia Ongoing tobacco abuse Ongoing alcohol abuse Diabetes mellitus History of pancreatic insufficiency History of anemia Sigmoid carcinoma  Plan:  Overall his condition is improved. Patient has remained hemodynamically stable. IV fluids have been changed to normal saline given the correction of  hypkalemia.  Continue IV fluids for now.  Continue line holiday.  Trying to avoid PICC line/central line for a couple of  days to let the bacteremia clear. Daily blood cultures. ID on board.  Continue vancomycin.  Follow IDs recs Echocardiogram awaited  Continue thiamine and folic acid Continue CIWA protocol  Monitor daily CBC.  Hgb stable. Transfuse for hemoglobin less than 7  Insulin sliding scale for glycemic control Enoxaparin for DVT prophylaxis Protonix for stress ulcer prophylaxis  Stable to be transferred to the floor. Telemetry bed.   Nettie Elm, M.D.  Pulmonary & Critical Care Medicine

## 2017-05-30 NOTE — Progress Notes (Signed)
Patient alert bur only oriented to self at the moment. Patient unable to say where he is and if he is having any pain. Patient grabbing at tele wiring but follows some commands. Does not answer most questions. No skin issues. Sacral foam placed for precautions. Patient is placed in room next to nurses station. No visitors are present at this time. Vital signs stable. Will continue to monitor mental status.   Deri Fuelling, RN

## 2017-05-30 NOTE — Progress Notes (Signed)
Bedside swallow eval done at bedside by nursing.  Swallowed several spoonfuls of applesauce without any signs of choking, coughing or clearing throat.  Cont receiving Ativan via CIWA protocol, so pt is drowsy and is not consistent with following commands.  Order to transfer to tele bed. Report called to [facility].  BA

## 2017-05-30 NOTE — Progress Notes (Signed)
[facility] Hospital Day(s): 2.   Post op day(s): 1 Day Post-Op.   Interval History: Patient seen and examined, no acute events or new complaints overnight. Patient was admitted to stepdown/ICU for closer monitoring after patient experienced hypotension not responsive to fluid bolus in PACU. This morning he appears to be normotensive and again more alert. He denies much pain at his former port site, denies fever/chills, CP, or SOB.  Review of Systems:  Constitutional: denies fever, chills  HEENT: denies cough or congestion  Respiratory: denies any shortness of breath  Cardiovascular: denies chest pain or palpitations  Gastrointestinal: denies abdominal pain, N/V, or diarrhea Genitourinary: denies burning with urination or urinary frequency Musculoskeletal: denies pain, decreased motor or sensation Integumentary: denies any other rashes or skin discolorations except Right upper chest former port site as per interval history Neurological: denies HA or vision/hearing changes   Vital signs in last 24 hours: [min-max] current  Temp:  [97.6 F (36.4 C)-99.6 F (37.6 C)] 98.1 F (36.7 C) (12/01 0100) Pulse Rate:  [60-104] 63 (12/01 0600) Resp:  [13-33] 23 (12/01 0600) BP: (69-168)/(49-94) 82/64 (12/01 0600) SpO2:  [96 %-100 %] 100 % (12/01 0600) Weight:  [123 lb 7.3 oz (56 kg)] 123 lb 7.3 oz (56 kg) (11/30 1630)     Height: 5\' 8"  (172.7 cm) Weight: 123 lb 7.3 oz (56 kg) BMI (Calculated): 18.78   Intake/Output this shift:  No intake/output data recorded.   Intake/Output last 2 shifts:  @IOLAST2SHIFTS @   Physical Exam:  Constitutional: alert, cooperative and no distress  HENT: normocephalic without obvious abnormality  Eyes: PERRL, EOM's grossly intact and symmetric  Neuro: CN II - XII grossly intact and symmetric without deficit  Respiratory: breathing non-labored at rest  Cardiovascular: regular rate and sinus rhythm  Gastrointestinal: soft, non-tender, and  non-distended Musculoskeletal: UE and LE FROM, no edema or wounds, motor and sensation grossly intact, NT Integumentary: Right upper chest former port site erythema nearly resolved and NT with purulent fluid on gauze packing removed, but no additional purulent fluid appreciated in wound  Labs:  CBC Latest Ref Rng & Units 05/30/2017 05/29/2017 05/29/2017  WBC 3.8 - 10.6 K/uL 12.1(H) 14.6(H) 18.3(H)  Hemoglobin 13.0 - 18.0 g/dL 8.8(L) 9.1(L) 8.1(L)  Hematocrit 40.0 - 52.0 % 27.0(L) 27.0(L) 24.5(L)  Platelets 150 - 440 K/uL 163 166 156   CMP Latest Ref Rng & Units 05/30/2017 05/29/2017 05/29/2017  Glucose 65 - 99 mg/dL 137(H) 123(H) 106(H)  BUN 6 - 20 mg/dL 7 6 7   Creatinine 0.61 - 1.24 mg/dL 0.41(L) 0.53(L) 0.56(L)  Sodium 135 - 145 mmol/L 137 140 139  Potassium 3.5 - 5.1 mmol/L 4.0 2.9(L) 2.7(LL)  Chloride 101 - 111 mmol/L 112(H) 110 107  CO2 22 - 32 mmol/L 20(L) 25 25  Calcium 8.9 - 10.3 mg/dL 7.5(L) 7.6(L) 7.9(L)  Total Protein 6.5 - 8.1 g/dL - 5.7(L) 5.2(L)  Total Bilirubin 0.3 - 1.2 mg/dL - 0.5 0.5  Alkaline Phos 38 - 126 U/L - 154(H) 125  AST 15 - 41 U/L - 31 23  ALT 17 - 63 U/L - 20 16(L)    Assessment/Plan: (ICD-10's: B95.62, T80.212) 56 y.o. 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   - IV antibiotics per primary team   - change Right chest port cavity gauze packing once daily + prn   - supportive care and management of comorbidities as per medical team   - must complete prescribed course of antibiotics prior to replacement  of port for chemotherapy  - outpatient surgical follow-up in 1 - 2 weeks, please call if any questions or concerns  All of the above findings and recommendations were discussed with the patient, patient's family, and the medical team, and all of patient's and family's questions were answered to their expressed satisfaction.  Thank you for the opportunity to participate in this patient's care.  -- Marilynne Drivers Rosana Hoes, MD, [place]: [facility] - Partnering for exceptional care. Office: [phone]

## 2017-05-30 NOTE — Progress Notes (Signed)
MEDICATION RELATED CONSULT NOTE -  Pharmacy Consult for electrolyte management for 56 yo male admitted with infected port. Patient with hypokalemia. Patient currently in surgery. Patient received potassium 57mEq IV x 4 and magnesium 2g IV 1. Patient is receiving NS/39mEq of Potassium at 88mL/hr.   Plan:  Will order BMP/Magnesium/Phosphorus at 2000.   05/29/2017 1806 K 2.9, Mg 1.7, phos 3.2. CCM has ordered magnesium 1 gm IV x 1 and KCl 10 mEq IV Q1H x 6 doses. Will continue current regimen and recheck electrolytes tomorrow with AM labs.   12/1 K 4.0, phos 2.8, Mag 2.0 -  No supp needed at this time. Will recheck labs in AM.   Allergies  Allergen Reactions  . Aspirin Itching and Other (See Comments)    Reaction: abdominal pain    Patient Measurements: Height: 5\' 8"  (172.7 cm) Weight: 123 lb 7.3 oz (56 kg) IBW/kg (Calculated) : 68.4  Vital Signs: Temp: 98.1 F (36.7 C) (12/01 0800) Temp Source: Oral (12/01 0800) BP: 98/54 (12/01 0800) Pulse Rate: 61 (12/01 0800) Intake/Output from previous day: 11/30 0701 - 12/01 0700 In: 4310 [I.V.:2410; IV Piggyback:1900] Out: 2 [Blood:2] Intake/Output from this shift: No intake/output data recorded.  Labs: Recent Labs    05/28/17 1820 05/29/17 0352 05/29/17 1806 05/30/17 0525  WBC 23.7* 18.3* 14.6* 12.1*  HGB 9.4* 8.1* 9.1* 8.8*  HCT 28.5* 24.5* 27.0* 27.0*  PLT 172 156 166 163  CREATININE 0.65 0.56* 0.53* 0.41*  MG  --   --  1.7 2.0  PHOS  --   --  3.2 2.8  ALBUMIN 2.7* 2.1* 2.2*  --   PROT 6.3* 5.2* 5.7*  --   AST 23 23 31   --   ALT 20 16* 20  --   ALKPHOS 142* 125 154*  --   BILITOT 0.3 0.5 0.5  --    Estimated Creatinine Clearance: 80.7 mL/min (A) (by C-G formula based on SCr of 0.41 mg/dL (L)).    Medical History: Past Medical History:  Diagnosis Date  . Anemia   . BPH without obstruction/lower urinary tract symptoms   . Cancer ([place])   . Cellulitis of scrotum   . Cyst of prostate 05/13/2015  . Diabetes ([place])    . Difficulty urinating   . Dyspnea   . Epididymoorchitis   . ETOH abuse   . Fracture of right tibia and fibula   . GERD (gastroesophageal reflux disease)   . GI bleed   . Malignant neoplasm of sigmoid colon ([place])   . Pancreatitis   . Trichomoniasis   . Wears dentures    Partial upper and lower.  reports poor fit.    Pharmacy will continue to monitor and adjust per consult.   Rayna Sexton L 05/30/2017,10:27 AM

## 2017-05-30 NOTE — Progress Notes (Signed)
Pharmacy Antibiotic Note  [name] is a 56 y.o. male admitted on 05/28/2017 with sepsis/infected port-a-cath.  Pharmacy has been consulted for vancomycin and Zosyn dosing.  Weight on admission of 47.6 kg on 11/29. Wt 11/30 of 56 kg. Due to large difference in weight, asked RN to re-weigh pt today. Weight today (12/1) is 59 kg.   Plan:  DW 48kg  Vd 34L kei 0.062 hr-1  T1/2 11 hours. Vancomycin 1 gram q 18 hours ordered with stacked dosing. Level before 5th dose. Goal trough 15-20  12/1: Will increase dose due to change in weight to target goal trough 15-20. Increase dose to vancomycin 750 mg IV 12h. Ke 0.071, half life 9.8, Vd 40 L.   Height: 5\' 8"  (172.7 cm) Weight: 130 lb 1.1 oz (59 kg) IBW/kg (Calculated) : 68.4  Temp (24hrs), Avg:98.4 F (36.9 C), Min:97.6 F (36.4 C), Max:99.4 F (37.4 C)  Recent Labs  Lab 05/25/17 1209 05/28/17 1820 05/28/17 2018 05/28/17 2322 05/29/17 0352 05/29/17 1806 05/30/17 0525  WBC 12.3* 23.7*  --   --  18.3* 14.6* 12.1*  CREATININE 0.83 0.65  --   --  0.56* 0.53* 0.41*  LATICACIDVEN  --  3.0* 1.2 1.5  --  0.9  --     Estimated Creatinine Clearance: 85 mL/min (A) (by C-G formula based on SCr of 0.41 mg/dL (L)).    Allergies  Allergen Reactions  . Aspirin Itching and Other (See Comments)    Reaction: abdominal pain    Antimicrobials this admission: Vancomycin 11/29 >> Zosyn 11/29 >> 11/30   >>   Dose adjustments this admission:   Microbiology results: 11/29 BCx: 2/2 MRSA 11/29 UCx: pending 11/19 MRSA PCR: (-)      11/29 CXR: no acute disease 11/29 UA: pending Thank you for allowing pharmacy to be a part of this patient's care.  Rayna Sexton L 05/30/2017 11:01 AM

## 2017-05-30 NOTE — Progress Notes (Signed)
Patient ID: [name], male   DOB: 14-Jun-1961, 56 y.o.   MRN: [id number]  [facility] PROGRESS NOTE  [name]:[id number] DOB: [birth year] DOA: 05/28/2017 PCP: [name], MD  HPI/Subjective: Patient seen this morning - some confused, but stable. Yesterday after procedure, was transferred to ICU due to hypotension, stable now.  Objective: Vitals:   05/30/17 0800 05/30/17 1130  BP: (!) 98/54 97/66  Pulse: 61 60  Resp:  16  Temp: 98.1 F (36.7 C)   SpO2:  100%    Intake/Output Summary (Last 24 hours) at 05/30/2017 1229 Last data filed at 05/30/2017 0600 Gross per 24 hour  Intake 4310 ml  Output 2 ml  Net 4308 ml   Filed Weights   05/28/17 1720 05/29/17 1630 05/30/17 1000  Weight: 47.6 kg (105 lb) 56 kg (123 lb 7.3 oz) 59 kg (130 lb 1.1 oz)    ROS: Review of Systems  Unable to perform ROS: Acuity of condition   Exam: Physical Exam  Constitutional: He appears lethargic.  HENT:  Nose: No mucosal edema.  Mouth/Throat: No oropharyngeal exudate or posterior oropharyngeal edema.  Eyes: Conjunctivae and lids are normal. Pupils are equal, round, and reactive to light.  Neck: No JVD present. Carotid bruit is not present. No edema present. No thyroid mass and no thyromegaly present.  Cardiovascular: S1 normal and S2 normal. Exam reveals no gallop.  No murmur heard. Pulses:      Dorsalis pedis pulses are 2+ on the right side, and 2+ on the left side.  Respiratory: No respiratory distress. He has no wheezes. He has no rhonchi. He has no rales.  GI: Soft. Bowel sounds are normal. There is no tenderness.  Musculoskeletal:       Right ankle: He exhibits no swelling.       Left ankle: He exhibits no swelling.  Lymphadenopathy:    He has no cervical adenopathy.  Neurological: He appears lethargic.  Skin: Skin is warm. Nails show no clubbing.  Dry skin lower extremities.  Area of pus right port site  Psychiatric:  Difficult to assess since he did not talk with  me      Data Reviewed: Basic Metabolic Panel: Recent Labs  Lab 05/25/17 1209 05/28/17 1820 05/29/17 0352 05/29/17 1806 05/30/17 0525  NA 132* 138 139 140 137  K 3.1* 3.3* 2.7* 2.9* 4.0  CL 93* 100* 107 110 112*  CO2 27 28 25 25  20*  GLUCOSE 695* 98 106* 123* 137*  BUN <5* 8 7 6 7   CREATININE 0.83 0.65 0.56* 0.53* 0.41*  CALCIUM 9.2 8.8* 7.9* 7.6* 7.5*  MG  --   --   --  1.7 2.0  PHOS  --   --   --  3.2 2.8   Liver Function Tests: Recent Labs  Lab 05/25/17 1058 05/28/17 1820 05/29/17 0352 05/29/17 1806  AST 91* 23 23 31   ALT 58 20 16* 20  ALKPHOS 170* 142* 125 154*  BILITOT 0.5 0.3 0.5 0.5  PROT 7.2 6.3* 5.2* 5.7*  ALBUMIN 3.4* 2.7* 2.1* 2.2*   CBC: Recent Labs  Lab 05/25/17 1058 05/25/17 1209 05/28/17 1820 05/29/17 0352 05/29/17 1806 05/30/17 0525  WBC 10.9* 12.3* 23.7* 18.3* 14.6* 12.1*  NEUTROABS 8.7*  --  20.6*  --   --   --   HGB 9.9* 10.5* 9.4* 8.1* 9.1* 8.8*  HCT 29.7* 31.3* 28.5* 24.5* 27.0* 27.0*  MCV 93.7 94.2 93.2 93.6 93.5 94.2  PLT 205 193  172 156 166 163   Cardiac Enzymes: Recent Labs  Lab 05/25/17 1336 05/28/17 1820  TROPONINI <0.03 0.07*    CBG: Recent Labs  Lab 05/29/17 1131 05/29/17 1336 05/29/17 1621 05/29/17 2340 05/30/17 1130  GLUCAP 115* 134* 107* 121* 113*    Recent Results (from the past 240 hour(s))  Culture, blood (Routine x 2)     Status: Abnormal (Preliminary result)   Collection Time: 05/28/17  6:21 PM  Result Value Ref Range Status   Specimen Description BLOOD BLOOD LEFT ARM  Final   Special Requests   Final    BOTTLES DRAWN AEROBIC AND ANAEROBIC Blood Culture adequate volume   Culture  Setup Time   Final    GRAM POSITIVE COCCI IN BOTH AEROBIC AND ANAEROBIC BOTTLES CRITICAL RESULT CALLED TO, READ BACK BY AND VERIFIED WITH: KRISTIN MERRILL AT 9702 05/29/17 SDR    Culture (A)  Final    STAPHYLOCOCCUS AUREUS SUSCEPTIBILITIES TO FOLLOW Performed at [facility], [place] [address] [place],  [place] [address]    Report Status PENDING  Incomplete  Blood Culture ID Panel (Reflexed)     Status: Abnormal   Collection Time: 05/28/17  6:21 PM  Result Value Ref Range Status   Enterococcus species NOT DETECTED NOT DETECTED Final   Listeria monocytogenes NOT DETECTED NOT DETECTED Final   Staphylococcus species DETECTED (A) NOT DETECTED Final    Comment: CRITICAL RESULT CALLED TO, READ BACK BY AND VERIFIED WITH:  KRISTIN MERRILL AT 8850 05/29/17 SDR    Staphylococcus aureus DETECTED (A) NOT DETECTED Final    Comment: Methicillin (oxacillin)-resistant Staphylococcus aureus (MRSA). MRSA is predictably resistant to beta-lactam antibiotics (except ceftaroline). Preferred therapy is vancomycin unless clinically contraindicated. Patient requires contact precautions if  hospitalized. CRITICAL RESULT CALLED TO, READ BACK BY AND VERIFIED WITH:  KRISTIN MERRILL AT 2774 05/29/17 SDR    Methicillin resistance DETECTED (A) NOT DETECTED Final    Comment: CRITICAL RESULT CALLED TO, READ BACK BY AND VERIFIED WITH:  KRISTIN MERRILL AT 1287 05/29/17 SDR    Streptococcus species NOT DETECTED NOT DETECTED Final   Streptococcus agalactiae NOT DETECTED NOT DETECTED Final   Streptococcus pneumoniae NOT DETECTED NOT DETECTED Final   Streptococcus pyogenes NOT DETECTED NOT DETECTED Final   Acinetobacter baumannii NOT DETECTED NOT DETECTED Final   Enterobacteriaceae species NOT DETECTED NOT DETECTED Final   Enterobacter cloacae complex NOT DETECTED NOT DETECTED Final   Escherichia coli NOT DETECTED NOT DETECTED Final   Klebsiella oxytoca NOT DETECTED NOT DETECTED Final   Klebsiella pneumoniae NOT DETECTED NOT DETECTED Final   Proteus species NOT DETECTED NOT DETECTED Final   Serratia marcescens NOT DETECTED NOT DETECTED Final   Haemophilus influenzae NOT DETECTED NOT DETECTED Final   Neisseria meningitidis NOT DETECTED NOT DETECTED Final   Pseudomonas aeruginosa NOT DETECTED NOT DETECTED Final   Candida  albicans NOT DETECTED NOT DETECTED Final   Candida glabrata NOT DETECTED NOT DETECTED Final   Candida krusei NOT DETECTED NOT DETECTED Final   Candida parapsilosis NOT DETECTED NOT DETECTED Final   Candida tropicalis NOT DETECTED NOT DETECTED Final  Culture, blood (Routine x 2)     Status: Abnormal (Preliminary result)   Collection Time: 05/28/17  7:10 PM  Result Value Ref Range Status   Specimen Description BLOOD LEFT ANTECUBITAL  Final   Special Requests   Final    BOTTLES DRAWN AEROBIC AND ANAEROBIC Blood Culture adequate volume   Culture  Setup Time   Final  GRAM POSITIVE COCCI IN BOTH AEROBIC AND ANAEROBIC BOTTLES CRITICAL VALUE NOTED.  VALUE IS CONSISTENT WITH PREVIOUSLY REPORTED AND CALLED VALUE.    Culture STAPHYLOCOCCUS AUREUS (A)  Final   Report Status PENDING  Incomplete  Surgical pcr screen     Status: Abnormal   Collection Time: 05/28/17 10:44 PM  Result Value Ref Range Status   MRSA, PCR POSITIVE (A) NEGATIVE Final    Comment: RESULT CALLED TO, READ BACK BY AND VERIFIED WITH: MAT PAGE ON 05/29/17 AT 9622 BY JAG    Staphylococcus aureus POSITIVE (A) NEGATIVE Final    Comment: RESULT CALLED TO, READ BACK BY AND VERIFIED WITH: MAT PAGE ON 05/29/17 AT 0213 BY JAG (NOTE) The Xpert SA Assay (FDA approved for NASAL specimens in patients 23 years of age and older), is one component of a comprehensive surveillance program. It is not intended to diagnose infection nor to guide or monitor treatment.   Aerobic/Anaerobic Culture (surgical/deep wound)     Status: None (Preliminary result)   Collection Time: 05/29/17  1:31 PM  Result Value Ref Range Status   Specimen Description WOUND RIGHT UPPER CHEST  Final   Special Requests NONE  Final   Gram Stain   Final    FEW WBC PRESENT, PREDOMINANTLY PMN FEW GRAM POSITIVE COCCI IN CLUSTERS    Culture   Final    FEW STAPHYLOCOCCUS AUREUS SUSCEPTIBILITIES TO FOLLOW Performed at [facility], [place] [address],  [place], [place] [address]    Report Status PENDING  Incomplete  Culture, blood (single) w Reflex to ID Panel     Status: None (Preliminary result)   Collection Time: 05/29/17  6:06 PM  Result Value Ref Range Status   Specimen Description BLOOD LEFT ASSIST CONTROL  Final   Special Requests   Final    BOTTLES DRAWN AEROBIC AND ANAEROBIC Blood Culture adequate volume   Culture NO GROWTH < 12 HOURS  Final   Report Status PENDING  Incomplete     Studies: Ct Head Wo Contrast  Result Date: 05/28/2017 CLINICAL DATA:  Initial evaluation for acute altered mental status, increased confusion. History of prostate cancer. EXAM: CT HEAD WITHOUT CONTRAST TECHNIQUE: Contiguous axial images were obtained from the base of the skull through the vertex without intravenous contrast. COMPARISON:  Prior CT from 11/30/2016. FINDINGS: Brain: Stable atrophy with chronic small vessel ischemic disease. No acute intracranial hemorrhage. No acute large vessel territory infarct. No mass lesion, midline shift or mass effect. No hydrocephalus. No extra-axial fluid collection. Vascular: No hyperdense vessel. Scattered vascular calcifications noted within the carotid siphons. Skull: Scalp soft tissues and calvarium within normal limits. Sinuses/Orbits: Globes and orbital soft tissues normal. Paranasal sinuses and mastoid air cells are clear. Other: None. IMPRESSION: 1. No acute intracranial abnormality. 2. Stable atrophy with chronic small vessel ischemic disease. Electronically Signed   By: Jeannine Boga M.D.   On: 05/28/2017 18:21   Dg Chest Portable 1 View  Result Date: 05/28/2017 CLINICAL DATA:  Altered mental status. Prostate cancer and pancreatitis. EXAM: PORTABLE CHEST 1 VIEW COMPARISON:  05/25/2017 FINDINGS: Right Port-A-Cath terminates at the mid SVC. Midline trachea. Normal heart size and mediastinal contours. No pleural effusion or pneumothorax. Remote bilateral rib fractures. IMPRESSION: No acute cardiopulmonary  disease. Electronically Signed   By: Abigail Miyamoto M.D.   On: 05/28/2017 17:39    Scheduled Meds: . enoxaparin (LOVENOX) injection  40 mg Subcutaneous Q24H  . folic acid  1 mg Oral Daily  . insulin aspart  0-9 Units Subcutaneous Q6H  . LORazepam  0-4 mg Intravenous Q6H   Followed by  . LORazepam  0-4 mg Intravenous Q12H  . multivitamin with minerals  1 tablet Oral Daily  . pantoprazole (PROTONIX) IV  40 mg Intravenous Q24H  . thiamine  100 mg Oral Daily   Or  . thiamine  100 mg Intravenous Daily   Continuous Infusions: . sodium chloride 150 mL/hr (05/30/17 0808)  . vancomycin      Assessment/Plan:  1. Sepsis with staph aureus and hypotension postoperatively after removing port.  Leukocytosis, tachycardia and lactic acidosis.  Continue IV vancomycin.  Improved with fluids in stepdown, moved to floor now. Stable. 2. Hypokalemia replace potassium IV and orally.  IV magnesium also given. 3. Anemia.  We will get a stat hemoglobin to see if patient needs blood. 4. History of alcohol abuse on CIWA protocol- may be this is the reason for his confusion. 5. Type 2 diabetes on sliding scale. 6. Malignant neoplasm of sigmoid colon 7. Moderate protein calorie malnutrition 8. History of pancreatic insufficiency 9. Altered mental status- may be due to sepsis or due to alcohol withdrawal. monitor.  Code Status:     Code Status Orders  (From admission, onward)        Start     Ordered   05/28/17 2204  Full code  Continuous     05/28/17 2203    Code Status History    Date Active Date Inactive Code Status Order ID Comments User Context   12/01/2016 02:45 12/04/2016 16:34 Full Code [id number]  Lance Coon, MD Inpatient   05/12/2015 22:39 05/14/2015 17:23 Full Code [id number]  Lance Coon, MD Inpatient     Family Communication:  Disposition Plan: may be in 2-3 days.  Consultants:  General surgery  Critical care specialist  Procedures:  Port-A-Cath removal  Antibiotics:  IV  vancomycin  Time spent: 32 minutes  [employer]

## 2017-05-31 LAB — BASIC METABOLIC PANEL
Anion gap: 4 — ABNORMAL LOW (ref 5–15)
BUN: 6 mg/dL (ref 6–20)
CHLORIDE: 115 mmol/L — AB (ref 101–111)
CO2: 19 mmol/L — ABNORMAL LOW (ref 22–32)
CREATININE: 0.33 mg/dL — AB (ref 0.61–1.24)
Calcium: 7.5 mg/dL — ABNORMAL LOW (ref 8.9–10.3)
GFR calc non Af Amer: >60 mL/min (ref >60)
Glucose, Bld: 96 mg/dL (ref 65–99)
POTASSIUM: 2.8 mmol/L — AB (ref 3.5–5.1)
SODIUM: 138 mmol/L (ref 135–145)

## 2017-05-31 LAB — CBC
HEMATOCRIT: 26.2 % — AB (ref 40.0–52.0)
Hemoglobin: 8.6 g/dL — ABNORMAL LOW (ref 13.0–18.0)
MCH: 30.7 pg (ref 26.0–34.0)
MCHC: 32.7 g/dL (ref 32.0–36.0)
MCV: 93.6 fL (ref 80.0–100.0)
PLATELETS: 145 10*3/uL — AB (ref 150–440)
RBC: 2.8 MIL/uL — ABNORMAL LOW (ref 4.40–5.90)
RDW: 15.1 % — ABNORMAL HIGH (ref 11.5–14.5)
WBC: 7.6 10*3/uL (ref 3.8–10.6)

## 2017-05-31 LAB — ECHOCARDIOGRAM COMPLETE
CHL CUP MV DEC (S): 190
CHL CUP STROKE VOLUME: 44 mL
E/e' ratio: 7.32
EWDT: 190 ms
FS: 20 % — AB (ref 28–44)
Height: 68 in
IV/PV OW: 0.87
LADIAMINDEX: 2.33 cm/m2
LASIZE: 38 mm
LEFT ATRIUM END SYS DIAM: 38 mm
LV E/e' medial: 7.32
LV e' LATERAL: 8.5 cm/s
LV sys vol: 83 mL — AB (ref 21–61)
LVDIAVOL: 127 mL (ref 62–150)
LVDIAVOLIN: 78 mL/m2
LVEEAVG: 7.32
LVSYSVOLIN: 51 mL/m2
Lateral S' vel: 8.24 cm/s
MV pk A vel: 61.8 m/s
MV pk E vel: 62.2 m/s
MVAP: 3.93 cm2
MVSPHT: 56 ms
P 1/2 time: 785 ms
PW: 9.23 mm — AB (ref 0.6–1.1)
Simpson's disk: 35
TAPSE: 18.3 mm
TDI e' lateral: 8.5
TDI e' medial: 7.18
Weight: 1975.32 oz

## 2017-05-31 LAB — PROCALCITONIN: Procalcitonin: 0.66 ng/mL

## 2017-05-31 LAB — GLUCOSE, CAPILLARY
GLUCOSE-CAPILLARY: 89 mg/dL (ref 65–99)
GLUCOSE-CAPILLARY: 83 mg/dL (ref 65–99)
Glucose-Capillary: 179 mg/dL — ABNORMAL HIGH (ref 65–99)
Glucose-Capillary: 86 mg/dL (ref 65–99)

## 2017-05-31 LAB — VANCOMYCIN, TROUGH: VANCOMYCIN TR: 16 ug/mL (ref 15–20)

## 2017-05-31 LAB — CULTURE, BLOOD (ROUTINE X 2)
SPECIAL REQUESTS: ADEQUATE
Special Requests: ADEQUATE

## 2017-05-31 LAB — MAGNESIUM: Magnesium: 1.9 mg/dL (ref 1.7–2.4)

## 2017-05-31 LAB — POTASSIUM: Potassium: 3.3 mmol/L — ABNORMAL LOW (ref 3.5–5.1)

## 2017-05-31 LAB — PHOSPHORUS: PHOSPHORUS: 2.8 mg/dL (ref 2.5–4.6)

## 2017-05-31 MED ORDER — POTASSIUM CHLORIDE 10 MEQ/100ML IV SOLN
10.0000 meq | INTRAVENOUS | Status: AC
  Administered 2017-05-31 (×4): 10 meq via INTRAVENOUS
  Filled 2017-05-31 (×3): qty 100

## 2017-05-31 MED ORDER — POTASSIUM CHLORIDE 10 MEQ/100ML IV SOLN
10.0000 meq | INTRAVENOUS | Status: AC
  Administered 2017-05-31 (×2): 10 meq via INTRAVENOUS
  Filled 2017-05-31 (×2): qty 100

## 2017-05-31 MED ORDER — POTASSIUM CHLORIDE CRYS ER 20 MEQ PO TBCR
40.0000 meq | EXTENDED_RELEASE_TABLET | Freq: Two times a day (BID) | ORAL | Status: DC
  Administered 2017-05-31 – 2017-06-03 (×6): 40 meq via ORAL
  Filled 2017-05-31 (×9): qty 2

## 2017-05-31 NOTE — Progress Notes (Signed)
MEDICATION RELATED CONSULT NOTE -  Pharmacy Consult for electrolyte management for 56 yo male admitted with infected port. Patient with hypokalemia. Patient currently in surgery. Patient received potassium 48mEq IV x 4 and magnesium 2g IV 1. Patient is receiving NS/66mEq of Potassium at 74mL/hr.   Plan:  Will order BMP/Magnesium/Phosphorus at 2000.   05/29/2017 1806 K 2.9, Mg 1.7, phos 3.2. CCM has ordered magnesium 1 gm IV x 1 and KCl 10 mEq IV Q1H x 6 doses. Will continue current regimen and recheck electrolytes tomorrow with AM labs.   12/1 K 4.0, phos 2.8, Mag 2.0 -  No supp needed at this time. Will recheck labs in AM.   Allergies  Allergen Reactions  . Aspirin Itching and Other (See Comments)    Reaction: abdominal pain    Patient Measurements: Height: 5\' 8"  (172.7 cm) Weight: 130 lb 1.1 oz (59 kg) IBW/kg (Calculated) : 68.4  Vital Signs: BP: 102/71 (12/01 2157) Pulse Rate: 57 (12/01 2157) Intake/Output from previous day: 12/01 0701 - 12/02 0700 In: 2892.5 [I.V.:2592.5; IV Piggyback:300] Out: -  Intake/Output from this shift: Total I/O In: 1412.5 [I.V.:1262.5; IV Piggyback:150] Out: -   Labs: Recent Labs    05/28/17 1820 05/29/17 0352 05/29/17 1806 05/30/17 0525 05/31/17 0501  WBC 23.7* 18.3* 14.6* 12.1* 7.6  HGB 9.4* 8.1* 9.1* 8.8* 8.6*  HCT 28.5* 24.5* 27.0* 27.0* 26.2*  PLT 172 156 166 163 145*  CREATININE 0.65 0.56* 0.53* 0.41* 0.33*  MG  --   --  1.7 2.0 1.9  PHOS  --   --  3.2 2.8 2.8  ALBUMIN 2.7* 2.1* 2.2*  --   --   PROT 6.3* 5.2* 5.7*  --   --   AST 23 23 31   --   --   ALT 20 16* 20  --   --   ALKPHOS 142* 125 154*  --   --   BILITOT 0.3 0.5 0.5  --   --    Estimated Creatinine Clearance: 85 mL/min (A) (by C-G formula based on SCr of 0.33 mg/dL (L)).    Medical History: Past Medical History:  Diagnosis Date  . Anemia   . BPH without obstruction/lower urinary tract symptoms   . Cancer ([place])   . Cellulitis of scrotum   . Cyst of  prostate 05/13/2015  . Diabetes ([place])   . Difficulty urinating   . Dyspnea   . Epididymoorchitis   . ETOH abuse   . Fracture of right tibia and fibula   . GERD (gastroesophageal reflux disease)   . GI bleed   . Malignant neoplasm of sigmoid colon ([place])   . Pancreatitis   . Trichomoniasis   . Wears dentures    Partial upper and lower.  reports poor fit.    Pharmacy will continue to monitor and adjust per consult.   12/2 AM K+ 2.8. KCl 10 mEq IV x 4 doses ordered. F/u K+ after completion of infusions at 1500.  Fusaye Wachtel S 05/31/2017,6:40 AM

## 2017-05-31 NOTE — Progress Notes (Signed)
MEDICATION RELATED CONSULT NOTE -  Pharmacy Consult for electrolyte management for 56 yo male admitted with infected port. Patient with hypokalemia. Patient currently in surgery. Patient received potassium 91mEq IV x 4 and magnesium 2g IV 1. Patient is receiving NS/26mEq of Potassium at 35mL/hr.   Plan:  Will order BMP/Magnesium/Phosphorus at 2000.   05/29/2017 1806 K 2.9, Mg 1.7, phos 3.2. CCM has ordered magnesium 1 gm IV x 1 and KCl 10 mEq IV Q1H x 6 doses. Will continue current regimen and recheck electrolytes tomorrow with AM labs.   12/1 K 4.0, phos 2.8, Mag 2.0 -  No supp needed at this time. Will recheck labs in AM.   12/2 1444 K 3.3 Will continue current supplement order of Klor-Con 40 mEq po BID, give additional 10 mEq IV Q1H x 2 doses, and recheck electrolytes tomorrow with AM labs.   Allergies  Allergen Reactions  . Aspirin Itching and Other (See Comments)    Reaction: abdominal pain    Patient Measurements: Height: 5\' 8"  (172.7 cm) Weight: 130 lb 1.1 oz (59 kg) IBW/kg (Calculated) : 68.4  Vital Signs: BP: 98/64 (12/02 1356) Pulse Rate: 54 (12/02 1356) Intake/Output from previous day: 12/01 0701 - 12/02 0700 In: 2892.5 [I.V.:2592.5; IV Piggyback:300] Out: -  Intake/Output from this shift: Total I/O In: 702.5 [I.V.:702.5] Out: -   Labs: Recent Labs    05/28/17 1820 05/29/17 0352 05/29/17 1806 05/30/17 0525 05/31/17 0501  WBC 23.7* 18.3* 14.6* 12.1* 7.6  HGB 9.4* 8.1* 9.1* 8.8* 8.6*  HCT 28.5* 24.5* 27.0* 27.0* 26.2*  PLT 172 156 166 163 145*  CREATININE 0.65 0.56* 0.53* 0.41* 0.33*  MG  --   --  1.7 2.0 1.9  PHOS  --   --  3.2 2.8 2.8  ALBUMIN 2.7* 2.1* 2.2*  --   --   PROT 6.3* 5.2* 5.7*  --   --   AST 23 23 31   --   --   ALT 20 16* 20  --   --   ALKPHOS 142* 125 154*  --   --   BILITOT 0.3 0.5 0.5  --   --    Estimated Creatinine Clearance: 85 mL/min (A) (by C-G formula based on SCr of 0.33 mg/dL (L)).    Medical History: Past Medical  History:  Diagnosis Date  . Anemia   . BPH without obstruction/lower urinary tract symptoms   . Cancer ([place])   . Cellulitis of scrotum   . Cyst of prostate 05/13/2015  . Diabetes ([place])   . Difficulty urinating   . Dyspnea   . Epididymoorchitis   . ETOH abuse   . Fracture of right tibia and fibula   . GERD (gastroesophageal reflux disease)   . GI bleed   . Malignant neoplasm of sigmoid colon ([place])   . Pancreatitis   . Trichomoniasis   . Wears dentures    Partial upper and lower.  reports poor fit.    Pharmacy will continue to monitor and adjust per consult.   12/2 AM K+ 2.8. KCl 10 mEq IV x 4 doses ordered. F/u K+ after completion of infusions at 1500.  Laural Benes, Pharm.D., BCPS Clinical Pharmacist 05/31/2017,3:37 PM

## 2017-05-31 NOTE — Progress Notes (Signed)
Patient ID: [name], male   DOB: 04-23-1961, 56 y.o.   MRN: [id number]  [facility] PROGRESS NOTE  GLORIA LAMBERTSON FMB:[id number] DOB: 15-Dec-1960 DOA: 05/28/2017 PCP: [name], MD  HPI/Subjective: Patient seen this morning - some confused, but stable. As per girl friend, much improved and asking for food. Objective: Vitals:   05/31/17 1153 05/31/17 1356  BP: 104/67 98/64  Pulse: (!) 56 (!) 54  Resp: 14 18  Temp:    SpO2: 100% 100%    Intake/Output Summary (Last 24 hours) at 05/31/2017 1449 Last data filed at 05/31/2017 0741 Gross per 24 hour  Intake 3015 ml  Output -  Net 3015 ml   Filed Weights   05/28/17 1720 05/29/17 1630 05/30/17 1000  Weight: 47.6 kg (105 lb) 56 kg (123 lb 7.3 oz) 59 kg (130 lb 1.1 oz)    ROS: Review of Systems  Unable to perform ROS: Acuity of condition   Exam: Physical Exam  HENT:  Nose: No mucosal edema.  Mouth/Throat: No oropharyngeal exudate or posterior oropharyngeal edema.  Eyes: Conjunctivae and lids are normal. Pupils are equal, round, and reactive to light.  Neck: No JVD present. Carotid bruit is not present. No edema present. No thyroid mass and no thyromegaly present.  Cardiovascular: S1 normal and S2 normal. Exam reveals no gallop.  No murmur heard. Pulses:      Dorsalis pedis pulses are 2+ on the right side, and 2+ on the left side.  Respiratory: No respiratory distress. He has no wheezes. He has no rhonchi. He has no rales.  GI: Soft. Bowel sounds are normal. There is no tenderness.  Musculoskeletal:       Right ankle: He exhibits no swelling.       Left ankle: He exhibits no swelling.  Lymphadenopathy:    He has no cervical adenopathy.  Neurological: He is alert.  Skin: Skin is warm. Nails show no clubbing.  Dry skin lower extremities.  Area of pus right port site  Psychiatric:  He is awake, but appears some confused.      Data Reviewed: Basic Metabolic Panel: Recent Labs  Lab 05/28/17 1820  05/29/17 0352 05/29/17 1806 05/30/17 0525 05/31/17 0501  NA 138 139 140 137 138  K 3.3* 2.7* 2.9* 4.0 2.8*  CL 100* 107 110 112* 115*  CO2 28 25 25  20* 19*  GLUCOSE 98 106* 123* 137* 96  BUN 8 7 6 7 6   CREATININE 0.65 0.56* 0.53* 0.41* 0.33*  CALCIUM 8.8* 7.9* 7.6* 7.5* 7.5*  MG  --   --  1.7 2.0 1.9  PHOS  --   --  3.2 2.8 2.8   Liver Function Tests: Recent Labs  Lab 05/25/17 1058 05/28/17 1820 05/29/17 0352 05/29/17 1806  AST 91* 23 23 31   ALT 58 20 16* 20  ALKPHOS 170* 142* 125 154*  BILITOT 0.5 0.3 0.5 0.5  PROT 7.2 6.3* 5.2* 5.7*  ALBUMIN 3.4* 2.7* 2.1* 2.2*   CBC: Recent Labs  Lab 05/25/17 1058  05/28/17 1820 05/29/17 0352 05/29/17 1806 05/30/17 0525 05/31/17 0501  WBC 10.9*   < > 23.7* 18.3* 14.6* 12.1* 7.6  NEUTROABS 8.7*  --  20.6*  --   --   --   --   HGB 9.9*   < > 9.4* 8.1* 9.1* 8.8* 8.6*  HCT 29.7*   < > 28.5* 24.5* 27.0* 27.0* 26.2*  MCV 93.7   < > 93.2 93.6 93.5 94.2 93.6  PLT  205   < > 172 156 166 163 145*   < > = values in this interval not displayed.   Cardiac Enzymes: Recent Labs  Lab 05/25/17 1336 05/28/17 1820  TROPONINI <0.03 0.07*    CBG: Recent Labs  Lab 05/30/17 1130 05/30/17 1828 05/31/17 0046 05/31/17 0625 05/31/17 1139  GLUCAP 113* 131* 86 89 83    Recent Results (from the past 240 hour(s))  Culture, blood (Routine x 2)     Status: Abnormal   Collection Time: 05/28/17  6:21 PM  Result Value Ref Range Status   Specimen Description BLOOD BLOOD LEFT ARM  Final   Special Requests   Final    BOTTLES DRAWN AEROBIC AND ANAEROBIC Blood Culture adequate volume   Culture  Setup Time   Final    GRAM POSITIVE COCCI IN BOTH AEROBIC AND ANAEROBIC BOTTLES CRITICAL RESULT CALLED TO, READ BACK BY AND VERIFIED WITH: KRISTIN MERRILL AT 7902 05/29/17 SDR    Culture METHICILLIN RESISTANT STAPHYLOCOCCUS AUREUS (A)  Final   Report Status 05/31/2017 FINAL  Final   Organism ID, Bacteria METHICILLIN RESISTANT STAPHYLOCOCCUS AUREUS   Final      Susceptibility   Methicillin resistant staphylococcus aureus - MIC*    CIPROFLOXACIN >=8 RESISTANT Resistant     ERYTHROMYCIN >=8 RESISTANT Resistant     GENTAMICIN <=0.5 SENSITIVE Sensitive     OXACILLIN >=4 RESISTANT Resistant     TETRACYCLINE <=1 SENSITIVE Sensitive     VANCOMYCIN <=0.5 SENSITIVE Sensitive     TRIMETH/SULFA <=10 SENSITIVE Sensitive     CLINDAMYCIN RESISTANT Resistant     RIFAMPIN <=0.5 SENSITIVE Sensitive     Inducible Clindamycin POSITIVE Resistant     * METHICILLIN RESISTANT STAPHYLOCOCCUS AUREUS  Blood Culture ID Panel (Reflexed)     Status: Abnormal   Collection Time: 05/28/17  6:21 PM  Result Value Ref Range Status   Enterococcus species NOT DETECTED NOT DETECTED Final   Listeria monocytogenes NOT DETECTED NOT DETECTED Final   Staphylococcus species DETECTED (A) NOT DETECTED Final    Comment: CRITICAL RESULT CALLED TO, READ BACK BY AND VERIFIED WITH:  KRISTIN MERRILL AT 4097 05/29/17 SDR    Staphylococcus aureus DETECTED (A) NOT DETECTED Final    Comment: Methicillin (oxacillin)-resistant Staphylococcus aureus (MRSA). MRSA is predictably resistant to beta-lactam antibiotics (except ceftaroline). Preferred therapy is vancomycin unless clinically contraindicated. Patient requires contact precautions if  hospitalized. CRITICAL RESULT CALLED TO, READ BACK BY AND VERIFIED WITH:  KRISTIN MERRILL AT 3532 05/29/17 SDR    Methicillin resistance DETECTED (A) NOT DETECTED Final    Comment: CRITICAL RESULT CALLED TO, READ BACK BY AND VERIFIED WITH:  KRISTIN MERRILL AT 9924 05/29/17 SDR    Streptococcus species NOT DETECTED NOT DETECTED Final   Streptococcus agalactiae NOT DETECTED NOT DETECTED Final   Streptococcus pneumoniae NOT DETECTED NOT DETECTED Final   Streptococcus pyogenes NOT DETECTED NOT DETECTED Final   Acinetobacter baumannii NOT DETECTED NOT DETECTED Final   Enterobacteriaceae species NOT DETECTED NOT DETECTED Final   Enterobacter cloacae  complex NOT DETECTED NOT DETECTED Final   Escherichia coli NOT DETECTED NOT DETECTED Final   Klebsiella oxytoca NOT DETECTED NOT DETECTED Final   Klebsiella pneumoniae NOT DETECTED NOT DETECTED Final   Proteus species NOT DETECTED NOT DETECTED Final   Serratia marcescens NOT DETECTED NOT DETECTED Final   Haemophilus influenzae NOT DETECTED NOT DETECTED Final   Neisseria meningitidis NOT DETECTED NOT DETECTED Final   Pseudomonas aeruginosa NOT DETECTED NOT DETECTED Final  Candida albicans NOT DETECTED NOT DETECTED Final   Candida glabrata NOT DETECTED NOT DETECTED Final   Candida krusei NOT DETECTED NOT DETECTED Final   Candida parapsilosis NOT DETECTED NOT DETECTED Final   Candida tropicalis NOT DETECTED NOT DETECTED Final  Culture, blood (Routine x 2)     Status: Abnormal   Collection Time: 05/28/17  7:10 PM  Result Value Ref Range Status   Specimen Description BLOOD LEFT ANTECUBITAL  Final   Special Requests   Final    BOTTLES DRAWN AEROBIC AND ANAEROBIC Blood Culture adequate volume   Culture  Setup Time   Final    GRAM POSITIVE COCCI IN BOTH AEROBIC AND ANAEROBIC BOTTLES CRITICAL VALUE NOTED.  VALUE IS CONSISTENT WITH PREVIOUSLY REPORTED AND CALLED VALUE.    Culture (A)  Final    STAPHYLOCOCCUS AUREUS SUSCEPTIBILITIES PERFORMED ON PREVIOUS CULTURE WITHIN THE LAST 5 DAYS. Performed at [facility], [place] [address] [place], [place] [address]    Report Status 05/31/2017 FINAL  Final  Surgical pcr screen     Status: Abnormal   Collection Time: 05/28/17 10:44 PM  Result Value Ref Range Status   MRSA, PCR POSITIVE (A) NEGATIVE Final    Comment: RESULT CALLED TO, READ BACK BY AND VERIFIED WITH: MAT PAGE ON 05/29/17 AT 4235 BY JAG    Staphylococcus aureus POSITIVE (A) NEGATIVE Final    Comment: RESULT CALLED TO, READ BACK BY AND VERIFIED WITH: MAT PAGE ON 05/29/17 AT 0213 BY JAG (NOTE) The Xpert SA Assay (FDA approved for NASAL specimens in patients 79 years of age and  older), is one component of a comprehensive surveillance program. It is not intended to diagnose infection nor to guide or monitor treatment.   Aerobic/Anaerobic Culture (surgical/deep wound)     Status: None (Preliminary result)   Collection Time: 05/29/17  1:31 PM  Result Value Ref Range Status   Specimen Description WOUND RIGHT UPPER CHEST  Final   Special Requests NONE  Final   Gram Stain   Final    FEW WBC PRESENT, PREDOMINANTLY PMN FEW GRAM POSITIVE COCCI IN CLUSTERS Performed at [facility], [place] [address] [place], [place] [address]    Culture   Final    FEW METHICILLIN RESISTANT STAPHYLOCOCCUS AUREUS NO ANAEROBES ISOLATED; CULTURE IN PROGRESS FOR 5 DAYS    Report Status PENDING  Incomplete   Organism ID, Bacteria METHICILLIN RESISTANT STAPHYLOCOCCUS AUREUS  Final      Susceptibility   Methicillin resistant staphylococcus aureus - MIC*    CIPROFLOXACIN >=8 RESISTANT Resistant     ERYTHROMYCIN >=8 RESISTANT Resistant     GENTAMICIN <=0.5 SENSITIVE Sensitive     OXACILLIN >=4 RESISTANT Resistant     TETRACYCLINE <=1 SENSITIVE Sensitive     VANCOMYCIN <=0.5 SENSITIVE Sensitive     TRIMETH/SULFA <=10 SENSITIVE Sensitive     CLINDAMYCIN RESISTANT Resistant     RIFAMPIN <=0.5 SENSITIVE Sensitive     Inducible Clindamycin POSITIVE Resistant     * FEW METHICILLIN RESISTANT STAPHYLOCOCCUS AUREUS  Culture, blood (single) w Reflex to ID Panel     Status: None (Preliminary result)   Collection Time: 05/29/17  6:06 PM  Result Value Ref Range Status   Specimen Description BLOOD LEFT ASSIST CONTROL  Final   Special Requests   Final    BOTTLES DRAWN AEROBIC AND ANAEROBIC Blood Culture adequate volume   Culture NO GROWTH 2 DAYS  Final   Report Status PENDING  Incomplete  CULTURE, BLOOD (  ROUTINE X 2) w Reflex to ID Panel     Status: None (Preliminary result)   Collection Time: 05/30/17 11:35 AM  Result Value Ref Range Status   Specimen Description BLOOD LEFT ASSIST CONTROL   Final   Special Requests   Final    BOTTLES DRAWN AEROBIC AND ANAEROBIC Blood Culture adequate volume   Culture NO GROWTH < 24 HOURS  Final   Report Status PENDING  Incomplete  CULTURE, BLOOD (ROUTINE X 2) w Reflex to ID Panel     Status: None (Preliminary result)   Collection Time: 05/30/17 11:59 AM  Result Value Ref Range Status   Specimen Description BLOOD LEFT HAND  Final   Special Requests   Final    BOTTLES DRAWN AEROBIC AND ANAEROBIC Blood Culture adequate volume   Culture NO GROWTH < 24 HOURS  Final   Report Status PENDING  Incomplete  CULTURE, BLOOD (ROUTINE X 2) w Reflex to ID Panel     Status: None (Preliminary result)   Collection Time: 05/31/17  5:01 AM  Result Value Ref Range Status   Specimen Description BLOOD LEFT ANTECUBITAL  Final   Special Requests   Final    BOTTLES DRAWN AEROBIC AND ANAEROBIC Blood Culture adequate volume   Culture NO GROWTH <12 HOURS  Final   Report Status PENDING  Incomplete  CULTURE, BLOOD (ROUTINE X 2) w Reflex to ID Panel     Status: None (Preliminary result)   Collection Time: 05/31/17  5:10 AM  Result Value Ref Range Status   Specimen Description BLOOD LEFT HAND  Final   Special Requests   Final    BOTTLES DRAWN AEROBIC AND ANAEROBIC Blood Culture adequate volume   Culture NO GROWTH <12 HOURS  Final   Report Status PENDING  Incomplete     Studies: No results found.  Scheduled Meds: . enoxaparin (LOVENOX) injection  40 mg Subcutaneous Q24H  . folic acid  1 mg Oral Daily  . insulin aspart  0-9 Units Subcutaneous Q6H  . LORazepam  0-4 mg Intravenous Q12H  . multivitamin with minerals  1 tablet Oral Daily  . pantoprazole (PROTONIX) IV  40 mg Intravenous Q24H  . potassium chloride  40 mEq Oral BID  . thiamine  100 mg Oral Daily   Or  . thiamine  100 mg Intravenous Daily   Continuous Infusions: . sodium chloride 75 mL/hr at 05/31/17 0929  . vancomycin Stopped (05/31/17 0319)    Assessment/Plan:  1. Sepsis with staph aureus  and hypotension postoperatively after removing port.  Leukocytosis, tachycardia and lactic acidosis.  Continue IV vancomycin.  Improved with fluids in stepdown, moved to floor now. Stable. 2. Hypokalemia replace potassium IV and orally.  IV magnesium also given. 3. Anemia.  We will get a stat hemoglobin to see if patient needs blood. 4. History of alcohol abuse on CIWA protocol- may be this is the reason for his confusion. 5. Type 2 diabetes on sliding scale. 6. Malignant neoplasm of sigmoid colon 7. Moderate protein calorie malnutrition 8. History of pancreatic insufficiency 9. Altered mental status- may be due to sepsis or due to alcohol withdrawal. Monitor.- more alert today.  Code Status:     Code Status Orders  (From admission, onward)        Start     Ordered   05/28/17 2204  Full code  Continuous     05/28/17 2203    Code Status History    Date Active Date Inactive Code Status Order  ID Comments User Context   12/01/2016 02:45 12/04/2016 16:34 Full Code [id number]  Lance Coon, MD Inpatient   05/12/2015 22:39 05/14/2015 17:23 Full Code [id number]  Lance Coon, MD Inpatient     Family Communication:  Disposition Plan: may be in 2-3 days.  Consultants:  General surgery  Critical care specialist  Procedures:  Port-A-Cath removal  Antibiotics:  IV vancomycin  Time spent: 32 minutes  [employer]

## 2017-05-31 NOTE — Progress Notes (Deleted)
[facility] Initial Visit:  Patient Care Team: [name], MD as PCP - General (Internal Medicine) Clent Jacks, RN as Registered Nurse  CHIEF COMPLAINTS/PURPOSE OF CONSULTATION: Follow up on treatment of colon cancer  HISTORY OF PRESENTING ILLNESS: [name] 56 y.o. male with PMH listed below is referred by Dr.Davis to me for evaluation of recently diagnosed colorectal cancer. Patient reports that he has been having significant weight loss, abdominal pain, persistent diarrhea for past few months. And more recently he lost 15 pounds in the past 6 weeks.  He had GI work up including EGD and colonoscopy in June 2018 which revealed non obstructing large mass in sigmoid colon, several colon polyps, one rectal polyp and a large sessile mass in cardia.  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 Rectal polyp showed small fragments of adenocarcinoma.  11/30/2016 CT abdomen and pelvis with contrast showed no evidence of metastatic disease.  11/30/2016 CT head showed no acute intracranial abnormality Due to non compliancy, he has not done any advised follow up or work up till now. He was seen by Dr.Davis on 01/15/2017.  Today he reports abdominal pain which he rates 9 out of 10. He requests to get pain medication. He used to follow up with a pain clinic and due to noncompliance with appointment, he was discharged from there and he reports not having any pain medication and has tried tylenol with no relief of his pain.  Patient otherwise denies any fever/chills, CP, or any worsening of his chronic SOB. He denies drinking alcohol, however per previous note, his family members reported that he continues to drink 5-6 beers a day and more excessively on weekends. Today his girlfriend drop him to cancer  center and left. Patient has not get the [place] lower extremity doppler done which was ordered by Dr.Davis yesterday due to the concern of lower extremity tenderness and swelling.  Patient has significant family history including father died at age of 62 with colon cancer and his brother died at age of age of 70 due to colon cancer. Per patient, they did not get genetic testing.  Patient is single, lives with his mother who is 28 years old. He has total 4 children, youngest kid is 92 years old who does not live with him.   # he has now completed his workup with flexible sigmoidoscopy demonstrating no additional rectal mass for biopsy (initial rectal polyp biopsy showed microscopic adenocarcinoma concerning for contamination from sigmoid adenocarcinoma biopsy). He is referred to [facility] surgery and has been schedule for surgery on 03/10/2017.   04/17/2017 s/p low anterior resection with primary reanastomosis for rectosigmoid adenocarcinoma. Intraoperatively a moderate sized mass was found about 12 cm from the anal verge and there was no evidence of carcinomatosis or liver metastasis. Patient was released and discharged on 04/21/2017.  Interval history Patient presented for follow-up of her colon cancer and discussion for adjuvant chemotherapy treatment. Patient is status post resection on 04/17/2017. Pathology reviewed stage III colon cancer. He continues to have chronic diarrhea. He has poorly controlled diabetes and his glucose was over 800s during last clinic visit. He was called and told to go to ER but he did not. He has not seen her primary recently. He says he is taking insulin shots including novolog but he does not remember when  was his last insulin shots. He has lost 9 pounds since last visit 2 weeks ago. He reports eating well.  He continues to drink 1-2 beers a day.  Denies any bone discharges, fever or chills. Did not go to chemotherapy class due to conflicts and is scheduled tomorrow.  He has had medi  port placed during interval by Dr.Davis.  He says he is taking creons but partner says he is not. Also she reports patient has been forgetful lately.    Review of Systems  Constitutional: Positive for fatigue and unexpected weight change. Negative for appetite change, chills and fever.  HENT:   Negative for hearing loss and lump/mass.   Eyes: Negative for eye problems.  Respiratory: Negative for chest tightness and cough.   Cardiovascular: Negative for chest pain.  Gastrointestinal: Positive for abdominal pain and diarrhea. Negative for abdominal distention and blood in stool.  Endocrine: Negative for hot flashes.  Genitourinary: Negative for difficulty urinating.   Musculoskeletal: Negative for arthralgias.  Skin: Negative for itching.  Neurological: Negative for dizziness.  Hematological: Negative for adenopathy.  Psychiatric/Behavioral: The patient is not nervous/anxious.     MEDICAL HISTORY: Past Medical History:  Diagnosis Date  . Anemia   . BPH without obstruction/lower urinary tract symptoms   . Cancer ([place])   . Cellulitis of scrotum   . Cyst of prostate 05/13/2015  . Diabetes ([place])   . Difficulty urinating   . Dyspnea   . Epididymoorchitis   . ETOH abuse   . Fracture of right tibia and fibula   . GERD (gastroesophageal reflux disease)   . GI bleed   . Malignant neoplasm of sigmoid colon ([place])   . Pancreatitis   . Trichomoniasis   . Wears dentures    Partial upper and lower.  reports poor fit.    SURGICAL HISTORY: Past Surgical History:  Procedure Laterality Date  . COLONOSCOPY WITH PROPOFOL N/A 12/02/2016   Procedure: COLONOSCOPY WITH PROPOFOL;  Surgeon: Lucilla Lame, MD;  Location: [facility] ENDOSCOPY;  Service: Endoscopy;  Laterality: N/A;  . ESOPHAGOGASTRODUODENOSCOPY (EGD) WITH PROPOFOL N/A 12/02/2016   Procedure: ESOPHAGOGASTRODUODENOSCOPY (EGD) WITH PROPOFOL;  Surgeon: Lucilla Lame, MD;  Location: [facility] ENDOSCOPY;  Service: Endoscopy;  Laterality: N/A;  .  FLEXIBLE SIGMOIDOSCOPY N/A 02/05/2017   Procedure: FLEXIBLE SIGMOIDOSCOPY;  Surgeon: Lucilla Lame, MD;  Location: [place];  Service: Gastroenterology;  Laterality: N/A;  Needs labs drawn. Needs to come in early.  No anesthesia  . FRACTURE SURGERY     TIBIA AND FIBULA  . FRACTURE SURGERY    . LAPAROSCOPIC PARTIAL COLECTOMY  04/17/2017   [facility]  . PORTACATH PLACEMENT Right 05/18/2017   Procedure: INSERTION PORT-A-CATH;  Surgeon: Vickie Epley, MD;  Location: [facility] ORS;  Service: Vascular;  Laterality: Right;  . resection of pancreas    . SCROTAL EXPLORATION      SOCIAL HISTORY: Social History   Socioeconomic History  . Marital status: Single    Spouse name: Not on file  . Number of children: Not on file  . Years of education: Not on file  . Highest education level: Not on file  Social Needs  . Financial resource strain: Not on file  . Food insecurity - worry: Not on file  . Food insecurity - inability: Not on file  . Transportation needs - medical: Not on file  . Transportation needs - non-medical: Not on file  Occupational History  . Not on file  Tobacco Use  . Smoking status: Current Every  Day Smoker    Packs/day: 0.25    Years: 41.00    Pack years: 10.25    Types: Cigarettes  . Smokeless tobacco: Never Used  . Tobacco comment: since age 35  Substance and Sexual Activity  . Alcohol use: Yes    Alcohol/week: 0.0 oz    Comment: occasionally - but says not drinkning currently  . Drug use: No  . Sexual activity: Yes    Partners: Male    Birth control/protection: None  Other Topics Concern  . Not on file  Social History Narrative  . Not on file    FAMILY HISTORY Family History  Problem Relation Age of Onset  . Cancer - Colon Father   . Colon cancer Brother     ALLERGIES:  is allergic to aspirin.  MEDICATIONS:  No current facility-administered medications for this visit.    No current outpatient medications on file.   Facility-Administered  Medications Ordered in Other Visits  Medication Dose Route Frequency Provider Last Rate Last Dose  . 0.9 %  sodium chloride infusion   Intravenous Continuous Vaughan Basta, MD 75 mL/hr at 05/31/17 0929    . acetaminophen (TYLENOL) tablet 650 mg  650 mg Oral Q6H PRN Lance Coon, MD       Or  . acetaminophen (TYLENOL) suppository 650 mg  650 mg Rectal Q6H PRN Lance Coon, MD      . enoxaparin (LOVENOX) injection 40 mg  40 mg Subcutaneous Q24H Lance Coon, MD   40 mg at 05/31/17 2053  . folic acid (FOLVITE) tablet 1 mg  1 mg Oral Daily Lance Coon, MD      . insulin aspart (novoLOG) injection 0-9 Units  0-9 Units Subcutaneous Q6H Lance Coon, MD   2 Units at 05/31/17 1755  . LORazepam (ATIVAN) injection 0-4 mg  0-4 mg Intravenous Laurence Spates, MD   2 mg at 05/31/17 2053  . LORazepam (ATIVAN) tablet 1 mg  1 mg Oral Q6H PRN Lance Coon, MD       Or  . LORazepam (ATIVAN) injection 1 mg  1 mg Intravenous Q6H PRN Lance Coon, MD   1 mg at 05/31/17 0734  . morphine 2 MG/ML injection 2 mg  2 mg Intravenous Q4H PRN Lance Coon, MD      . multivitamin with minerals tablet 1 tablet  1 tablet Oral Daily Lance Coon, MD      . ondansetron [facility]) tablet 4 mg  4 mg Oral Q6H PRN Lance Coon, MD       Or  . ondansetron [facility]) injection 4 mg  4 mg Intravenous Q6H PRN Lance Coon, MD      . pantoprazole (PROTONIX) injection 40 mg  40 mg Intravenous Q24H Nettie Elm, MD   40 mg at 05/31/17 1658  . potassium chloride SA (K-DUR,KLOR-CON) CR tablet 40 mEq  40 mEq Oral BID Vaughan Basta, MD   40 mEq at 05/31/17 2053  . thiamine (VITAMIN B-1) tablet 100 mg  100 mg Oral Daily Lance Coon, MD       Or  . thiamine (B-1) injection 100 mg  100 mg Intravenous Daily Lance Coon, MD   100 mg at 05/31/17 0109  . vancomycin (VANCOCIN) IVPB 750 mg/150 ml premix  750 mg Intravenous Q12H Rocky Morel, [facility]   Stopped at 05/31/17 1655    PHYSICAL EXAMINATION:  ECOG  PERFORMANCE STATUS: 1 - Symptomatic but completely ambulatory  There were no vitals filed for this visit.  There  were no vitals filed for this visit.   Physical Exam GENERAL: No distress, cachectica.   SKIN:  No rashes or significant lesions  HEAD: Normocephalic, No masses, lesions, tenderness or abnormalities  EYES: Conjunctiva are pink, non icteric ENT: External ears normal ,lips , buccal mucosa, and tongue normal and mucous membranes are moist  LYMPH: No palpable cervical and axillary lymphadenopathy  LUNGS: Clear to auscultation, no crackles or wheezes HEART: Regular rate & rhythm, no murmurs, no gallops, S1 normal and S2 normal  ABDOMEN: Abdomen soft, mild epigastric tenderness, normal bowel sounds, I did not appreciate any  masses or organomegaly.  MUSCULOSKELETAL: No CVA tenderness and no tenderness on percussion of the back or rib cage.  EXTREMITIES: No edema, no skin discoloration or tenderness NEURO: Alert & oriented,   LABORATORY DATA: I have personally reviewed the data as listed:  Admission on 05/28/2017  No results displayed because visit has over 200 results.    Admission on 05/25/2017, Discharged on 05/25/2017  Component Date Value Ref Range Status  . Sodium 05/25/2017 132* 135 - 145 mmol/L Final  . Potassium 05/25/2017 3.1* 3.5 - 5.1 mmol/L Final  . Chloride 05/25/2017 93* 101 - 111 mmol/L Final  . CO2 05/25/2017 27  22 - 32 mmol/L Final  . Glucose, Bld 05/25/2017 695* 65 - 99 mg/dL Final   Comment: CRITICAL RESULT CALLED TO, READ BACK BY AND VERIFIED WITH SONJIA WEAVER AT 1250 ON 05/25/17 [place].   . BUN 05/25/2017 <5* 6 - 20 mg/dL Final  . Creatinine, Ser 05/25/2017 0.83  0.61 - 1.24 mg/dL Final  . Calcium 05/25/2017 9.2  8.9 - 10.3 mg/dL Final  . GFR calc non Af Amer 05/25/2017 >60  >60 mL/min Final  . GFR calc Af Amer 05/25/2017 >60  >60 mL/min Final   Comment: (NOTE) The eGFR has been calculated using the CKD EPI equation. This calculation has not been  validated in all clinical situations. eGFR's persistently <60 mL/min signify possible Chronic Kidney Disease.   . Anion gap 05/25/2017 12  5 - 15 Final  . WBC 05/25/2017 12.3* 3.8 - 10.6 K/uL Final  . RBC 05/25/2017 3.32* 4.40 - 5.90 MIL/uL Final  . Hemoglobin 05/25/2017 10.5* 13.0 - 18.0 g/dL Final  . HCT 05/25/2017 31.3* 40.0 - 52.0 % Final  . MCV 05/25/2017 94.2  80.0 - 100.0 fL Final  . MCH 05/25/2017 31.5  26.0 - 34.0 pg Final  . MCHC 05/25/2017 33.5  32.0 - 36.0 g/dL Final  . RDW 05/25/2017 15.0* 11.5 - 14.5 % Final  . Platelets 05/25/2017 193  150 - 440 K/uL Final  . Color, Urine 05/25/2017 STRAW* YELLOW Final  . APPearance 05/25/2017 CLEAR* CLEAR Final  . Specific Gravity, Urine 05/25/2017 1.028  1.005 - 1.030 Final  . pH 05/25/2017 6.0  5.0 - 8.0 Final  . Glucose, UA 05/25/2017 >=500* NEGATIVE mg/dL Final  . Hgb urine dipstick 05/25/2017 NEGATIVE  NEGATIVE Final  . Bilirubin Urine 05/25/2017 NEGATIVE  NEGATIVE Final  . Ketones, ur 05/25/2017 NEGATIVE  NEGATIVE mg/dL Final  . Protein, ur 05/25/2017 NEGATIVE  NEGATIVE mg/dL Final  . Nitrite 05/25/2017 NEGATIVE  NEGATIVE Final  . Leukocytes, UA 05/25/2017 NEGATIVE  NEGATIVE Final  . RBC / HPF 05/25/2017 0-5  0 - 5 RBC/hpf Final  . WBC, UA 05/25/2017 6-30  0 - 5 WBC/hpf Final  . Bacteria, UA 05/25/2017 NONE SEEN  NONE SEEN Final  . Squamous Epithelial / LPF 05/25/2017 0-5* NONE SEEN Final  . Glucose-Capillary  05/25/2017 >600* 65 - 99 mg/dL Final  . FIO2 05/25/2017 0.21   Final  . pH, Ven 05/25/2017 7.35  7.250 - 7.430 Final  . pCO2, Ven 05/25/2017 43* 44.0 - 60.0 mmHg Final  . pO2, Ven 05/25/2017 62.0* 32.0 - 45.0 mmHg Final  . Bicarbonate 05/25/2017 23.7  20.0 - 28.0 mmol/L Final  . Acid-base deficit 05/25/2017 1.8  0.0 - 2.0 mmol/L Final  . O2 Saturation 05/25/2017 90.1  % Final  . Patient temperature 05/25/2017 37.0   Final  . Collection site 05/25/2017 VENOUS   Final  . Sample type 05/25/2017 VENOUS   Final  .  Troponin I 05/25/2017 <0.03  <0.03 ng/mL Final  . Alcohol, Ethyl (B) 05/25/2017 <10  <10 mg/dL Final   Comment:        LOWEST DETECTABLE LIMIT FOR SERUM ALCOHOL IS 10 mg/dL FOR MEDICAL PURPOSES ONLY   . Glucose-Capillary 05/25/2017 >600* 65 - 99 mg/dL Final  . Comment 1 05/25/2017 Notify RN   Final  . Comment 2 05/25/2017 Document in Chart   Final  . Comment 3 05/25/2017 Call MD NNP PA CNM   Final  . Glucose-Capillary 05/25/2017 480* 65 - 99 mg/dL Final  . Glucose-Capillary 05/25/2017 395* 65 - 99 mg/dL Final  Appointment on 05/25/2017  Component Date Value Ref Range Status  . CEA 05/25/2017 13.4* 0.0 - 4.7 ng/mL Final   Comment: (NOTE)       Roche ECLIA methodology       Nonsmokers  <3.9                                     Smokers     <5.6 Performed At: [facility] [place], [place] [id number] Rush Farmer MD [id number]   . Sodium 05/25/2017 131* 135 - 145 mmol/L Final  . Potassium 05/25/2017 3.2* 3.5 - 5.1 mmol/L Final  . Chloride 05/25/2017 93* 101 - 111 mmol/L Final  . CO2 05/25/2017 29  22 - 32 mmol/L Final  . Glucose, Bld 05/25/2017 669* 65 - 99 mg/dL Final   Comment: RESULTS VERIFIED BY REPEAT TESTING CRITICAL RESULT CALLED TO, READ BACK BY AND VERIFIED WITH DR. Tasia Catchings @ 11:27 AN 05/25/2017 L[facility]   . BUN 05/25/2017 <5* 6 - 20 mg/dL Final  . Creatinine, Ser 05/25/2017 0.76  0.61 - 1.24 mg/dL Final  . Calcium 05/25/2017 9.1  8.9 - 10.3 mg/dL Final  . Total Protein 05/25/2017 7.2  6.5 - 8.1 g/dL Final  . Albumin 05/25/2017 3.4* 3.5 - 5.0 g/dL Final  . AST 05/25/2017 91* 15 - 41 U/L Final  . ALT 05/25/2017 58  17 - 63 U/L Final  . Alkaline Phosphatase 05/25/2017 170* 38 - 126 U/L Final  . Total Bilirubin 05/25/2017 0.5  0.3 - 1.2 mg/dL Final  . GFR calc non Af Amer 05/25/2017 >60  >60 mL/min Final  . GFR calc Af Amer 05/25/2017 >60  >60 mL/min Final   Comment: (NOTE) The eGFR has been calculated using the CKD EPI equation. This calculation has not  been validated in all clinical situations. eGFR's persistently <60 mL/min signify possible Chronic Kidney Disease.   . Anion gap 05/25/2017 9  5 - 15 Final  . WBC 05/25/2017 10.9* 3.8 - 10.6 K/uL Final  . RBC 05/25/2017 3.17* 4.40 - 5.90 MIL/uL Final  . Hemoglobin 05/25/2017 9.9* 13.0 - 18.0 g/dL Final  . HCT 05/25/2017  29.7* 40.0 - 52.0 % Final  . MCV 05/25/2017 93.7  80.0 - 100.0 fL Final  . MCH 05/25/2017 31.3  26.0 - 34.0 pg Final  . MCHC 05/25/2017 33.4  32.0 - 36.0 g/dL Final  . RDW 05/25/2017 14.7* 11.5 - 14.5 % Final  . Platelets 05/25/2017 205  150 - 440 K/uL Final  . Neutrophils Relative % 05/25/2017 80  % Final  . Neutro Abs 05/25/2017 8.7* 1.4 - 6.5 K/uL Final  . Lymphocytes Relative 05/25/2017 14  % Final  . Lymphs Abs 05/25/2017 1.5  1.0 - 3.6 K/uL Final  . Monocytes Relative 05/25/2017 5  % Final  . Monocytes Absolute 05/25/2017 0.6  0.2 - 1.0 K/uL Final  . Eosinophils Relative 05/25/2017 1  % Final  . Eosinophils Absolute 05/25/2017 0.1  0 - 0.7 K/uL Final  . Basophils Relative 05/25/2017 0  % Final  . Basophils Absolute 05/25/2017 0.0  0 - 0.1 K/uL Final  Admission on 05/18/2017, Discharged on 05/18/2017  Component Date Value Ref Range Status  . MRSA, PCR 05/18/2017 NEGATIVE  NEGATIVE Final  . Staphylococcus aureus 05/18/2017 NEGATIVE  NEGATIVE Final   Comment: (NOTE) The Xpert SA Assay (FDA approved for NASAL specimens in patients 28 years of age and older), is one component of a comprehensive surveillance program. It is not intended to diagnose infection nor to guide or monitor treatment.   . Glucose-Capillary 05/18/2017 385* 65 - 99 mg/dL Final  . Glucose-Capillary 05/18/2017 84  65 - 99 mg/dL Final  Office Visit on 05/05/2017  Component Date Value Ref Range Status  . CEA 05/05/2017 16.9* 0.0 - 4.7 ng/mL Final   Comment: (NOTE)       Roche ECLIA methodology       Nonsmokers  <3.9                                     Smokers     <5.6 Performed At: [facility] [place], [place] [id number] Rush Farmer MD [id number]   . Sodium 05/05/2017 125* 135 - 145 mmol/L Final  . Potassium 05/05/2017 3.4* 3.5 - 5.1 mmol/L Final  . Chloride 05/05/2017 92* 101 - 111 mmol/L Final  . CO2 05/05/2017 23  22 - 32 mmol/L Final  . Glucose, Bld 05/05/2017 836* 65 - 99 mg/dL Final   Comment: RESULTS VERIFIED BY REPEAT TESTING CRITICAL RESULT CALLED TO, READ BACK BY AND VERIFIED WITH JENNY BURNS AT 13:10 05/05/2017 K[facility]   . BUN 05/05/2017 9  6 - 20 mg/dL Final  . Creatinine, Ser 05/05/2017 0.84  0.61 - 1.24 mg/dL Final  . Calcium 05/05/2017 9.1  8.9 - 10.3 mg/dL Final  . Total Protein 05/05/2017 7.2  6.5 - 8.1 g/dL Final  . Albumin 05/05/2017 3.4* 3.5 - 5.0 g/dL Final  . AST 05/05/2017 35  15 - 41 U/L Final  . ALT 05/05/2017 36  17 - 63 U/L Final  . Alkaline Phosphatase 05/05/2017 143* 38 - 126 U/L Final  . Total Bilirubin 05/05/2017 0.5  0.3 - 1.2 mg/dL Final  . GFR calc non Af Amer 05/05/2017 >60  >60 mL/min Final  . GFR calc Af Amer 05/05/2017 >60  >60 mL/min Final   Comment: (NOTE) The eGFR has been calculated using the CKD EPI equation. This calculation has not been validated in all clinical situations. eGFR's persistently <60 mL/min signify possible Chronic Kidney Disease.   Marland Kitchen  Anion gap 05/05/2017 10  5 - 15 Final  . WBC 05/05/2017 10.7* 3.8 - 10.6 K/uL Final  . RBC 05/05/2017 3.32* 4.40 - 5.90 MIL/uL Final  . Hemoglobin 05/05/2017 10.4* 13.0 - 18.0 g/dL Final  . HCT 05/05/2017 31.6* 40.0 - 52.0 % Final  . MCV 05/05/2017 95.1  80.0 - 100.0 fL Final  . MCH 05/05/2017 31.3  26.0 - 34.0 pg Final  . MCHC 05/05/2017 32.9  32.0 - 36.0 g/dL Final  . RDW 05/05/2017 16.3* 11.5 - 14.5 % Final  . Platelets 05/05/2017 300  150 - 440 K/uL Final  . Neutrophils Relative % 05/05/2017 83  % Final  . Neutro Abs 05/05/2017 8.9* 1.4 - 6.5 K/uL Final  . Lymphocytes Relative 05/05/2017 9  % Final  . Lymphs Abs 05/05/2017 1.0  1.0 - 3.6  K/uL Final  . Monocytes Relative 05/05/2017 6  % Final  . Monocytes Absolute 05/05/2017 0.6  0.2 - 1.0 K/uL Final  . Eosinophils Relative 05/05/2017 1  % Final  . Eosinophils Absolute 05/05/2017 0.1  0 - 0.7 K/uL Final  . Basophils Relative 05/05/2017 1  % Final  . Basophils Absolute 05/05/2017 0.1  0 - 0.1 K/uL Final    RADIOGRAPHIC STUDIES: I have personally reviewed the radiological images as listed and agree with the findings in the report 01/15/2017 [place] bilateral lower extremity doppler: No lower extremity DVT in either side.  11/30/2016: CT abdomen pelvis w contrast: Stable chronic changes as described above.Small gastrohepatic ligament lymph nodes slightly more prominent than that seen on the prior exam likely of a reactive nature. 11/30/2016: CT head wo contrast: Chronic stable small vessel ischemic disease of periventricular white matter. No acute intracranial abnormality. 01/23/2017 CT chest abdomen pelvis w contrast 1. The sigmoid colon mass is difficult to perceive. No definite adenopathy in this vicinity. No liver metastatic disease is identified. 2. Chronically stable right hepatic lobe hemangioma. 3. I am suspicious of hepatic steatosis. 4. Mildly enlarged right gastric lymph node at 1.1 cm, previously 1.2 cm, significance uncertain. 5. Other imaging findings of potential clinical significance: 7 mm hypodense lesion in the right kidney upper pole is technically nonspecific although statistically likely to be a cyst. Descending colon diverticulosis. Aortic Atherosclerosis (ICD10-I70.0). Chronic crescentic hypodensity in the right prostate gland, possibly a cyst or some type of urethral diverticulum. Lower lumbar degenerative disc disease.  [facility] pathology A: Colon, sigmoid and rectum, low anterior resection  - Invasive moderately differentiated adenocarcinoma of the proximal rectum, 4.5 cm, with transmural invasion focally into perirectal soft tissue (see comment and synoptic report) -  Angiolymphatic invasion present - No perineural invasion identified - Metastatic carcinoma involves one of thirty-two regional lymph nodes (1/32) - Surgical margins uninvolved - Separate tubular adenoma, 0.8 cm TUMOR  Tumor Site:Rectum   Tumor Location:Straddles the anterior peritoneal reflection   Histologic Type:Adenocarcinoma   Histologic Grade:G2: Moderately differentiated   Tumor Size:Greatest dimension in Centimeters (cm): 4.5 Centimeters (cm)  Additional Dimension in Centimeters (cm):1.5 Centimeters (cm)  Additional Dimension in Centimeters (cm):0.8 Centimeters (cm)  Tumor Deposits:Not identified   Tumor Extent:  Tumor Extension:No evidence of primary tumor   Macroscopic Tumor Perforation:Not identified   Accessory Findings:  Lymphovascular Invasion:Present   :Small vessel lymphovascular invasion   Perineural Invasion:Not identified   Tumor Budding:Number of tumor buds in one 'hotspot' field (total number in area = 0.785 mm2): 5   :Intermediate score (5-9)   Type of Polyp in Which Invasive Carcinoma Arose:None identified  Treatment Effect:No known presurgical therapy   MARGINS  Margins:All margins are uninvolved by invasive carcinoma, high-grade dysplasia, intramucosal adenocarcinoma, and adenoma   Margins Examined:Proximal   Margins Examined:Distal   Margins Examined:Radial or Mesenteric   Distance of Invasive Carcinoma from Closest Margin:1.0 Centimeters (cm)  Closest Margin:Radial or Mesenteric   Distance of Tumor from Radial Margin:1.0 Centimeters (cm)  Distance of Tumor from Distal Margin:3.5 Centimeters (cm)  LYMPH NODES  Number of Lymph Nodes Involved:1   Number of Lymph Nodes Examined:32   PATHOLOGIC STAGE CLASSIFICATION (pTNM, AJCC 8th Edition)   TNM Descriptors:Not applicable   Primary Tumor (pT):pT3   Regional Lymph Nodes (pN):pN1    Mismatch Repair:  Immunohistochemistry (IHC) Testing for Mismatch Repair (MMR) Proteins:MLH1  MLH1 Result:Intact nuclear expression  Immunohistochemistry (IHC) Testing for Mismatch Repair (MMR) Proteins:MSH2  MSH2 Result:Intact nuclear expression  Immunohistochemistry (IHC) Testing for Mismatch Repair (MMR) Proteins:MSH6  MSH6 Result:Intact nuclear expression  Immunohistochemistry (IHC) Testing for Mismatch Repair (MMR) Proteins:PMS2  PMS2 Result:Intact nuclear expression  Immunohistochemistry (IHC) Testing for Mismatch Repair (MMR) Proteins:Background nonneoplastic tissue / internal control with intact nuclear expression  IHC Interpretation:No loss of nuclear expression of MMR proteins: low probability of MSI-H      ASSESSMENT/PLAN Cancer Staging Malignant neoplasm of rectum (HCC) Staging form: Colon and Rectum, AJCC 8th Edition - Clinical: Stage Unknown (cTX, cNX, cM0) - Signed by Earlie Server, MD on 05/25/2017 - Pathologic stage from 05/25/2017: Stage IIIB (pT3, pN1, cM0) - Signed by Earlie Server, MD on 05/25/2017   No diagnosis found..  Plan:  # pT3N1 colorectal cancer.  Pathology results were discussed with patient. I discussed with him about adjuvant chemotherapy with 5-FU-containing regimen. CEA remains elevated. ? Non specific due to chronic diarrhea or mets? Consider CT scan.   # Patient has chronic epigastric pain due to chronic pancreatitis, continue his pain medication, 03/19/2017 refilled oxycodone 37m Q6 hour PRN, dispense 60 pill. He did not ask for more Rx today.   # He has ongoing diarrhea from pancreatic dysfunction as well as colon cancer. Continue Creon to 36,000 units TID, lomotil PRN. Referred to GI for further evaluation titration of Creon.   # Chemotherapy class for  FOLFOX, he missed his appointment and is rescheduled.  # will meet nutritionist today.  # I discussed with patient that I highly recommend him to be complaint with his medical appointment with primary care physician Dr.Burns. I have called and talked to Dr.Burns regarding his case and I urge patient to call pcp's office and get an appointment to tighten his glucose control. Also he was referred to GI for chronic diarrhea and pancreatic insufficieny. I discussed with him that if he continue to be not compliant with glucose control, and medication, I may not be able to offer him chemotherapy as chemotherapy may harm him than benefit him if he is too weak. Will check his lab today.   # He has a strong history of family history of colon cancer, needs genetic counseling. Due to his noncompliance, most time of his clinic visits have been concentrating on advising him to be mor compliant and coordinate his care with [facility] surgery and primary care physician. I have not get chance to discuss with him again recently. Will revisit in the future.   Today he is again hyperglycemic, and low BP, will send him to ER for further evaluation. He agrees.   All questions were answered. The patient knows to call the clinic with any problems, questions or concerns. Return  visit in 1 week.     Dr. Earlie Server, MD, PhD [facility] Pager- [phone] 05/31/2017

## 2017-05-31 NOTE — Progress Notes (Signed)
Patient resting in bed. Wife/Significant other at bedside. MD rounded on patient. IV fluids infusing. MD to place orders to start patient on diet and to advance as tolerated. No complaints at this time. Continue to monitor.

## 2017-05-31 NOTE — Progress Notes (Signed)
Pharmacy Antibiotic Note  [name] is a 56 y.o. male admitted on 05/28/2017 with sepsis/infected port-a-cath.  Pharmacy has been consulted for vancomycin and Zosyn dosing.  Weight on admission of 47.6 kg on 11/29. Wt 11/30 of 56 kg. Due to large difference in weight, asked RN to re-weigh pt today. Weight today (12/1) is 59 kg.   Plan:  DW 48kg  Vd 34L kei 0.062 hr-1  T1/2 11 hours. Vancomycin 1 gram q 18 hours ordered with stacked dosing. Level before 5th dose. Goal trough 15-20  12/1: Will increase dose due to change in weight to target goal trough 15-20. Increase dose to vancomycin 750 mg IV 12h. Ke 0.071, half life 9.8, Vd 40 L.   12/2 1444 vancomycin trough 16 mcg/mL. Continue current regimen. Pharmacy will continue to follow and adjust as needed to maintain trough 15 to 20 mcg/mL.   Height: 5\' 8"  (172.7 cm) Weight: 130 lb 1.1 oz (59 kg) IBW/kg (Calculated) : 68.4  No data recorded.  Recent Labs  Lab 05/28/17 1820 05/28/17 2018 05/28/17 2322 05/29/17 0352 05/29/17 1806 05/30/17 0525 05/31/17 0501 05/31/17 1444  WBC 23.7*  --   --  18.3* 14.6* 12.1* 7.6  --   CREATININE 0.65  --   --  0.56* 0.53* 0.41* 0.33*  --   LATICACIDVEN 3.0* 1.2 1.5  --  0.9  --   --   --   VANCOTROUGH  --   --   --   --   --   --   --  16    Estimated Creatinine Clearance: 85 mL/min (A) (by C-G formula based on SCr of 0.33 mg/dL (L)).    Allergies  Allergen Reactions  . Aspirin Itching and Other (See Comments)    Reaction: abdominal pain    Antimicrobials this admission: Vancomycin 11/29 >> Zosyn 11/29 >> 11/30   >>   Dose adjustments this admission:   Microbiology results: 11/29 BCx: 2/2 MRSA 11/29 UCx: pending 11/19 MRSA PCR: (-)      11/29 CXR: no acute disease 11/29 UA: pending Thank you for allowing pharmacy to be a part of this patient's care.  Laural Benes, Pharm.D., BCPS Clinical Pharmacist 05/31/2017 3:40 PM

## 2017-05-31 NOTE — Progress Notes (Signed)
Tele called and stated patient had a sinus pause of 2.28. MD notified, no new interventions, Patient stable. Continue to monitor.

## 2017-05-31 NOTE — Progress Notes (Signed)
Patient in bed resting. Alert and oriented to self and significant other/ wife. Patient acknowledges he is at the hospital. Patient tolerated clear liquids at lunch but ate very little. Denies pain, no tremor noted, agitation has significantly decreased. Will continue to monitor for safety.   Kyla Balzarine SN, [facility]

## 2017-06-01 ENCOUNTER — Inpatient Hospital Stay: Payer: Medicaid Other

## 2017-06-01 ENCOUNTER — Encounter: Payer: Self-pay | Admitting: Surgery

## 2017-06-01 ENCOUNTER — Inpatient Hospital Stay: Payer: Medicaid Other | Admitting: Oncology

## 2017-06-01 DIAGNOSIS — Z9119 Patient's noncompliance with other medical treatment and regimen: Secondary | ICD-10-CM | POA: Insufficient documentation

## 2017-06-01 DIAGNOSIS — R970 Elevated carcinoembryonic antigen [CEA]: Secondary | ICD-10-CM

## 2017-06-01 DIAGNOSIS — G8929 Other chronic pain: Secondary | ICD-10-CM

## 2017-06-01 DIAGNOSIS — E440 Moderate protein-calorie malnutrition: Secondary | ICD-10-CM | POA: Insufficient documentation

## 2017-06-01 DIAGNOSIS — R197 Diarrhea, unspecified: Secondary | ICD-10-CM | POA: Insufficient documentation

## 2017-06-01 DIAGNOSIS — C187 Malignant neoplasm of sigmoid colon: Secondary | ICD-10-CM

## 2017-06-01 DIAGNOSIS — Z794 Long term (current) use of insulin: Secondary | ICD-10-CM

## 2017-06-01 DIAGNOSIS — D125 Benign neoplasm of sigmoid colon: Secondary | ICD-10-CM | POA: Insufficient documentation

## 2017-06-01 DIAGNOSIS — Z79899 Other long term (current) drug therapy: Secondary | ICD-10-CM

## 2017-06-01 DIAGNOSIS — F1010 Alcohol abuse, uncomplicated: Secondary | ICD-10-CM

## 2017-06-01 DIAGNOSIS — R109 Unspecified abdominal pain: Secondary | ICD-10-CM

## 2017-06-01 DIAGNOSIS — K8689 Other specified diseases of pancreas: Secondary | ICD-10-CM | POA: Insufficient documentation

## 2017-06-01 DIAGNOSIS — Z9114 Patient's other noncompliance with medication regimen: Secondary | ICD-10-CM

## 2017-06-01 DIAGNOSIS — E1165 Type 2 diabetes mellitus with hyperglycemia: Secondary | ICD-10-CM | POA: Insufficient documentation

## 2017-06-01 LAB — BASIC METABOLIC PANEL
Anion gap: 3 — ABNORMAL LOW (ref 5–15)
CHLORIDE: 117 mmol/L — AB (ref 101–111)
CO2: 18 mmol/L — AB (ref 22–32)
CREATININE: 0.48 mg/dL — AB (ref 0.61–1.24)
Calcium: 7.6 mg/dL — ABNORMAL LOW (ref 8.9–10.3)
GFR calc Af Amer: >60 mL/min (ref >60)
GFR calc non Af Amer: >60 mL/min (ref >60)
GLUCOSE: 125 mg/dL — AB (ref 65–99)
POTASSIUM: 3.1 mmol/L — AB (ref 3.5–5.1)
SODIUM: 138 mmol/L (ref 135–145)

## 2017-06-01 LAB — GLUCOSE, CAPILLARY
GLUCOSE-CAPILLARY: 125 mg/dL — AB (ref 65–99)
Glucose-Capillary: 112 mg/dL — ABNORMAL HIGH (ref 65–99)
Glucose-Capillary: 135 mg/dL — ABNORMAL HIGH (ref 65–99)
Glucose-Capillary: 189 mg/dL — ABNORMAL HIGH (ref 65–99)

## 2017-06-01 LAB — CBC
HEMATOCRIT: 25.4 % — AB (ref 40.0–52.0)
Hemoglobin: 8.3 g/dL — ABNORMAL LOW (ref 13.0–18.0)
MCH: 30.6 pg (ref 26.0–34.0)
MCHC: 32.9 g/dL (ref 32.0–36.0)
MCV: 93.1 fL (ref 80.0–100.0)
PLATELETS: 121 10*3/uL — AB (ref 150–440)
RBC: 2.73 MIL/uL — ABNORMAL LOW (ref 4.40–5.90)
RDW: 15.5 % — AB (ref 11.5–14.5)
WBC: 6.5 10*3/uL (ref 3.8–10.6)

## 2017-06-01 LAB — MAGNESIUM: Magnesium: 1.8 mg/dL (ref 1.7–2.4)

## 2017-06-01 LAB — PHOSPHORUS: Phosphorus: 3.2 mg/dL (ref 2.5–4.6)

## 2017-06-01 MED ORDER — HALOPERIDOL LACTATE 5 MG/ML IJ SOLN
2.0000 mg | Freq: Once | INTRAMUSCULAR | Status: AC
  Administered 2017-06-01: 2 mg via INTRAVENOUS

## 2017-06-01 MED ORDER — POTASSIUM CHLORIDE 10 MEQ/100ML IV SOLN
10.0000 meq | INTRAVENOUS | Status: AC
  Administered 2017-06-01 (×2): 10 meq via INTRAVENOUS
  Filled 2017-06-01 (×2): qty 100

## 2017-06-01 MED ORDER — HALOPERIDOL LACTATE 5 MG/ML IJ SOLN
INTRAMUSCULAR | Status: AC
  Filled 2017-06-01: qty 1

## 2017-06-01 MED ORDER — POTASSIUM CHLORIDE 10 MEQ/100ML IV SOLN
10.0000 meq | INTRAVENOUS | Status: AC
  Administered 2017-06-01: 10 meq via INTRAVENOUS
  Filled 2017-06-01: qty 100

## 2017-06-01 MED ORDER — CHLORHEXIDINE GLUCONATE CLOTH 2 % EX PADS
6.0000 | MEDICATED_PAD | Freq: Every day | CUTANEOUS | Status: AC
  Administered 2017-06-01 – 2017-06-04 (×4): 6 via TOPICAL

## 2017-06-01 MED ORDER — VANCOMYCIN HCL IN DEXTROSE 750-5 MG/150ML-% IV SOLN
750.0000 mg | Freq: Two times a day (BID) | INTRAVENOUS | Status: DC
  Administered 2017-06-01 – 2017-06-04 (×5): 750 mg via INTRAVENOUS
  Filled 2017-06-01 (×7): qty 150

## 2017-06-01 MED ORDER — MUPIROCIN 2 % EX OINT
1.0000 "application " | TOPICAL_OINTMENT | Freq: Two times a day (BID) | CUTANEOUS | Status: DC
  Administered 2017-06-01 – 2017-06-03 (×6): 1 via NASAL
  Filled 2017-06-01: qty 22

## 2017-06-01 NOTE — Progress Notes (Signed)
Spoke with patients mom over phone and obtained consent for PICC placement.

## 2017-06-01 NOTE — Progress Notes (Signed)
Patient continues to try to get out of bed.  PRN ativan and haldol does not appear to be affective. Pulled out 2 ivs, new one inserted. Will cont to monitor

## 2017-06-01 NOTE — Progress Notes (Signed)
IV RN arrived at patient room to place PICC.  Patient girlfriend in room with patient.  Patient resting.  This RN informed girlfriend patient would be getting PICC placed.  Girlfriend stated " I know nothing of this procedure"  This RN informed girlfriend that consent was received from patient mother. Girlfriend asked to speak with patient nurse.  This RN located patient RN to tell him GF had questions and wanted to speak with him regarding PICC placement. RN went in room to talk with patient GF.  After approximately 15 mins, this RN went to patient RM.  GF stating" his mother on her last leg and I have been making all his decisions for the last 4 years".  This RN informed both patient RN and GF that PICC placement would be put on hold until legal agreement was established.  Patient RN  And GF verbalized understanding.  Patient RN stated" patient PICC placement would be followed up in the morning with the MD and patient family."

## 2017-06-01 NOTE — Progress Notes (Signed)
MEDICATION RELATED CONSULT NOTE -  Pharmacy Consult for electrolyte management for 56 yo male admitted with infected port. Patient with hypokalemia. Patient currently in surgery. Patient received potassium 88mEq IV x 4 and magnesium 2g IV 1. Patient is receiving NS/92mEq of Potassium at 108mL/hr.   Plan:  Will order BMP/Magnesium/Phosphorus at 2000.   05/29/2017 1806 K 2.9, Mg 1.7, phos 3.2. CCM has ordered magnesium 1 gm IV x 1 and KCl 10 mEq IV Q1H x 6 doses. Will continue current regimen and recheck electrolytes tomorrow with AM labs.   12/1 K 4.0, phos 2.8, Mag 2.0 -  No supp needed at this time. Will recheck labs in AM.   12/2 1444 K 3.3 Will continue current supplement order of Klor-Con 40 mEq po BID, give additional 10 mEq IV Q1H x 2 doses, and recheck electrolytes tomorrow with AM labs.   12/3: K= 3.1. Will continue current supplementation of KCl 40 mEq PO BID. Will give an additional 10 mEq IV x 3 doses IV and recheck electrolytes in am.   Allergies  Allergen Reactions  . Aspirin Itching and Other (See Comments)    Reaction: abdominal pain    Patient Measurements: Height: 5\' 8"  (172.7 cm) Weight: 130 lb 1.1 oz (59 kg) IBW/kg (Calculated) : 68.4  Vital Signs:   Intake/Output from previous day: 12/02 0701 - 12/03 0700 In: 2511.3 [I.V.:2211.3; IV Piggyback:300] Out: -  Intake/Output from this shift: No intake/output data recorded.  Labs: Recent Labs    05/29/17 1806 05/30/17 0525 05/31/17 0501 06/01/17 0450  WBC 14.6* 12.1* 7.6 6.5  HGB 9.1* 8.8* 8.6* 8.3*  HCT 27.0* 27.0* 26.2* 25.4*  PLT 166 163 145* 121*  CREATININE 0.53* 0.41* 0.33* 0.48*  MG 1.7 2.0 1.9 1.8  PHOS 3.2 2.8 2.8 3.2  ALBUMIN 2.2*  --   --   --   PROT 5.7*  --   --   --   AST 31  --   --   --   ALT 20  --   --   --   ALKPHOS 154*  --   --   --   BILITOT 0.5  --   --   --    Estimated Creatinine Clearance: 85 mL/min (A) (by C-G formula based on SCr of 0.48 mg/dL (L)).    Medical  History: Past Medical History:  Diagnosis Date  . Anemia   . BPH without obstruction/lower urinary tract symptoms   . Cancer ([place])   . Cellulitis of scrotum   . Cyst of prostate 05/13/2015  . Diabetes ([place])   . Difficulty urinating   . Dyspnea   . Epididymoorchitis   . ETOH abuse   . Fracture of right tibia and fibula   . GERD (gastroesophageal reflux disease)   . GI bleed   . Malignant neoplasm of sigmoid colon ([place])   . Pancreatitis   . Trichomoniasis   . Wears dentures    Partial upper and lower.  reports poor fit.    Pharmacy will continue to monitor and adjust per consult.   Fonnie Crookshanks D, Pharm.D., BCPS Clinical Pharmacist 06/01/2017,9:37 AM

## 2017-06-01 NOTE — Progress Notes (Signed)
Patient RN called regarding PICC placement for patient.  Patient RN stated " I have to call his MOM for verbal consent"  IV RN told Nurse would follow up with him in approximately 45 minutes.  Patient RN verbalized understanding.

## 2017-06-01 NOTE — Progress Notes (Signed)
[place] INFECTIOUS DISEASE PROGRESS NOTE Date of Admission:  05/28/2017     ID: [name] is a 56 y.o. male with portqath infection   Principal Problem:   Sepsis ([place]) Active Problems:   Type 2 diabetes mellitus (HCC)   ETOH abuse   BPH (benign prostatic hyperplasia)  Subjective: Out of unit, no fevers. Remains confused. Mother at bedside  ROS  Unable to obtain   Medications:  Antibiotics Given (last 72 hours)    Date/Time Action Medication Dose Rate   05/30/17 0304 New Bag/Given   vancomycin (VANCOCIN) IVPB 1000 mg/200 mL premix 1,000 mg 200 mL/hr   05/30/17 1534 New Bag/Given   vancomycin (VANCOCIN) IVPB 750 mg/150 ml premix 750 mg 150 mL/hr   05/31/17 0219 New Bag/Given   vancomycin (VANCOCIN) IVPB 750 mg/150 ml premix 750 mg 150 mL/hr   05/31/17 1555 New Bag/Given   vancomycin (VANCOCIN) IVPB 750 mg/150 ml premix 750 mg 150 mL/hr   06/01/17 0120 New Bag/Given  [patient pull iv out]   vancomycin (VANCOCIN) IVPB 750 mg/150 ml premix 750 mg 150 mL/hr     . Chlorhexidine Gluconate Cloth  6 each Topical Q0600  . enoxaparin (LOVENOX) injection  40 mg Subcutaneous Q24H  . folic acid  1 mg Oral Daily  . insulin aspart  0-9 Units Subcutaneous Q6H  . multivitamin with minerals  1 tablet Oral Daily  . mupirocin ointment  1 application Nasal BID  . pantoprazole (PROTONIX) IV  40 mg Intravenous Q24H  . potassium chloride  40 mEq Oral BID  . thiamine  100 mg Oral Daily   Or  . thiamine  100 mg Intravenous Daily    Objective: Vital signs in last 24 hours: Temp:  [97.7 F (36.5 C)-98.4 F (36.9 C)] 97.7 F (36.5 C) (12/03 1217) Pulse Rate:  [60-65] 65 (12/03 1217) Resp:  [14-20] 20 (12/03 1217) BP: (101-146)/(66-78) 146/78 (12/03 1217) SpO2:  [99 %-100 %] 99 % (12/03 1217) Constitutional: confused HENT: anicteric Mouth/Throat: oroarynx is  dry. No oropharyngeal exudate.  Cardiovascular: Normal rate, regular rhythm and normal heart sounds.  Pulmonary/Chest:  Effort normal and breath sounds normal. No respiratory distress. He has no wheezes.  ANt chest wall PC site with healing wound Abdominal: Soft. Bowel sounds are normal. He exhibits no distension. There is no tenderness.  Lymphadenopathy: He has no cervical adenopathy.  Neurological: lethargic Skin: Skin is warm and dry. No rash noted. No erythema.  Psychiatric: lethargic   Lab Results Recent Labs    05/31/17 0501 05/31/17 1444 06/01/17 0450  WBC 7.6  --  6.5  HGB 8.6*  --  8.3*  HCT 26.2*  --  25.4*  NA 138  --  138  K 2.8* 3.3* 3.1*  CL 115*  --  117*  CO2 19*  --  18*  BUN 6  --  <5*  CREATININE 0.33*  --  0.48*    Microbiology: Results for orders placed or performed during the hospital encounter of 05/28/17  Culture, blood (Routine x 2)     Status: Abnormal   Collection Time: 05/28/17  6:21 PM  Result Value Ref Range Status   Specimen Description BLOOD BLOOD LEFT ARM  Final   Special Requests   Final    BOTTLES DRAWN AEROBIC AND ANAEROBIC Blood Culture adequate volume   Culture  Setup Time   Final    GRAM POSITIVE COCCI IN BOTH AEROBIC AND ANAEROBIC BOTTLES CRITICAL RESULT CALLED TO, READ BACK BY AND  VERIFIED WITH: KRISTIN MERRILL AT 5732 05/29/17 SDR    Culture METHICILLIN RESISTANT STAPHYLOCOCCUS AUREUS (A)  Final   Report Status 05/31/2017 FINAL  Final   Organism ID, Bacteria METHICILLIN RESISTANT STAPHYLOCOCCUS AUREUS  Final      Susceptibility   Methicillin resistant staphylococcus aureus - MIC*    CIPROFLOXACIN >=8 RESISTANT Resistant     ERYTHROMYCIN >=8 RESISTANT Resistant     GENTAMICIN <=0.5 SENSITIVE Sensitive     OXACILLIN >=4 RESISTANT Resistant     TETRACYCLINE <=1 SENSITIVE Sensitive     VANCOMYCIN <=0.5 SENSITIVE Sensitive     TRIMETH/SULFA <=10 SENSITIVE Sensitive     CLINDAMYCIN RESISTANT Resistant     RIFAMPIN <=0.5 SENSITIVE Sensitive     Inducible Clindamycin POSITIVE Resistant     * METHICILLIN RESISTANT STAPHYLOCOCCUS AUREUS  Blood  Culture ID Panel (Reflexed)     Status: Abnormal   Collection Time: 05/28/17  6:21 PM  Result Value Ref Range Status   Enterococcus species NOT DETECTED NOT DETECTED Final   Listeria monocytogenes NOT DETECTED NOT DETECTED Final   Staphylococcus species DETECTED (A) NOT DETECTED Final    Comment: CRITICAL RESULT CALLED TO, READ BACK BY AND VERIFIED WITH:  KRISTIN MERRILL AT 2025 05/29/17 SDR    Staphylococcus aureus DETECTED (A) NOT DETECTED Final    Comment: Methicillin (oxacillin)-resistant Staphylococcus aureus (MRSA). MRSA is predictably resistant to beta-lactam antibiotics (except ceftaroline). Preferred therapy is vancomycin unless clinically contraindicated. Patient requires contact precautions if  hospitalized. CRITICAL RESULT CALLED TO, READ BACK BY AND VERIFIED WITH:  KRISTIN MERRILL AT 4270 05/29/17 SDR    Methicillin resistance DETECTED (A) NOT DETECTED Final    Comment: CRITICAL RESULT CALLED TO, READ BACK BY AND VERIFIED WITH:  KRISTIN MERRILL AT 6237 05/29/17 SDR    Streptococcus species NOT DETECTED NOT DETECTED Final   Streptococcus agalactiae NOT DETECTED NOT DETECTED Final   Streptococcus pneumoniae NOT DETECTED NOT DETECTED Final   Streptococcus pyogenes NOT DETECTED NOT DETECTED Final   Acinetobacter baumannii NOT DETECTED NOT DETECTED Final   Enterobacteriaceae species NOT DETECTED NOT DETECTED Final   Enterobacter cloacae complex NOT DETECTED NOT DETECTED Final   Escherichia coli NOT DETECTED NOT DETECTED Final   Klebsiella oxytoca NOT DETECTED NOT DETECTED Final   Klebsiella pneumoniae NOT DETECTED NOT DETECTED Final   Proteus species NOT DETECTED NOT DETECTED Final   Serratia marcescens NOT DETECTED NOT DETECTED Final   Haemophilus influenzae NOT DETECTED NOT DETECTED Final   Neisseria meningitidis NOT DETECTED NOT DETECTED Final   Pseudomonas aeruginosa NOT DETECTED NOT DETECTED Final   Candida albicans NOT DETECTED NOT DETECTED Final   Candida glabrata  NOT DETECTED NOT DETECTED Final   Candida krusei NOT DETECTED NOT DETECTED Final   Candida parapsilosis NOT DETECTED NOT DETECTED Final   Candida tropicalis NOT DETECTED NOT DETECTED Final  Culture, blood (Routine x 2)     Status: Abnormal   Collection Time: 05/28/17  7:10 PM  Result Value Ref Range Status   Specimen Description BLOOD LEFT ANTECUBITAL  Final   Special Requests   Final    BOTTLES DRAWN AEROBIC AND ANAEROBIC Blood Culture adequate volume   Culture  Setup Time   Final    GRAM POSITIVE COCCI IN BOTH AEROBIC AND ANAEROBIC BOTTLES CRITICAL VALUE NOTED.  VALUE IS CONSISTENT WITH PREVIOUSLY REPORTED AND CALLED VALUE.    Culture (A)  Final    STAPHYLOCOCCUS AUREUS SUSCEPTIBILITIES PERFORMED ON PREVIOUS CULTURE WITHIN THE LAST 5 DAYS. Performed at [facility]  [facility], [place] [address] [place], [place] [address]    Report Status 05/31/2017 FINAL  Final  Surgical pcr screen     Status: Abnormal   Collection Time: 05/28/17 10:44 PM  Result Value Ref Range Status   MRSA, PCR POSITIVE (A) NEGATIVE Final    Comment: RESULT CALLED TO, READ BACK BY AND VERIFIED WITH: MAT PAGE ON 05/29/17 AT 3149 BY JAG    Staphylococcus aureus POSITIVE (A) NEGATIVE Final    Comment: RESULT CALLED TO, READ BACK BY AND VERIFIED WITH: MAT PAGE ON 05/29/17 AT 0213 BY JAG (NOTE) The Xpert SA Assay (FDA approved for NASAL specimens in patients 73 years of age and older), is one component of a comprehensive surveillance program. It is not intended to diagnose infection nor to guide or monitor treatment.   Aerobic/Anaerobic Culture (surgical/deep wound)     Status: None (Preliminary result)   Collection Time: 05/29/17  1:31 PM  Result Value Ref Range Status   Specimen Description WOUND RIGHT UPPER CHEST  Final   Special Requests NONE  Final   Gram Stain   Final    FEW WBC PRESENT, PREDOMINANTLY PMN FEW GRAM POSITIVE COCCI IN CLUSTERS Performed at [facility], [place] [address], [place],  [place] [address]    Culture   Final    FEW METHICILLIN RESISTANT STAPHYLOCOCCUS AUREUS NO ANAEROBES ISOLATED; CULTURE IN PROGRESS FOR 5 DAYS    Report Status PENDING  Incomplete   Organism ID, Bacteria METHICILLIN RESISTANT STAPHYLOCOCCUS AUREUS  Final      Susceptibility   Methicillin resistant staphylococcus aureus - MIC*    CIPROFLOXACIN >=8 RESISTANT Resistant     ERYTHROMYCIN >=8 RESISTANT Resistant     GENTAMICIN <=0.5 SENSITIVE Sensitive     OXACILLIN >=4 RESISTANT Resistant     TETRACYCLINE <=1 SENSITIVE Sensitive     VANCOMYCIN <=0.5 SENSITIVE Sensitive     TRIMETH/SULFA <=10 SENSITIVE Sensitive     CLINDAMYCIN RESISTANT Resistant     RIFAMPIN <=0.5 SENSITIVE Sensitive     Inducible Clindamycin POSITIVE Resistant     * FEW METHICILLIN RESISTANT STAPHYLOCOCCUS AUREUS  Culture, blood (single) w Reflex to ID Panel     Status: None (Preliminary result)   Collection Time: 05/29/17  6:06 PM  Result Value Ref Range Status   Specimen Description BLOOD LEFT ASSIST CONTROL  Final   Special Requests   Final    BOTTLES DRAWN AEROBIC AND ANAEROBIC Blood Culture adequate volume   Culture NO GROWTH 3 DAYS  Final   Report Status PENDING  Incomplete  CULTURE, BLOOD (ROUTINE X 2) w Reflex to ID Panel     Status: None (Preliminary result)   Collection Time: 05/30/17 11:35 AM  Result Value Ref Range Status   Specimen Description BLOOD LEFT ASSIST CONTROL  Final   Special Requests   Final    BOTTLES DRAWN AEROBIC AND ANAEROBIC Blood Culture adequate volume   Culture NO GROWTH 2 DAYS  Final   Report Status PENDING  Incomplete  CULTURE, BLOOD (ROUTINE X 2) w Reflex to ID Panel     Status: None (Preliminary result)   Collection Time: 05/30/17 11:59 AM  Result Value Ref Range Status   Specimen Description BLOOD LEFT HAND  Final   Special Requests   Final    BOTTLES DRAWN AEROBIC AND ANAEROBIC Blood Culture adequate volume   Culture NO GROWTH 2 DAYS  Final   Report Status PENDING  Incomplete   CULTURE, BLOOD (ROUTINE X  2) w Reflex to ID Panel     Status: None (Preliminary result)   Collection Time: 05/31/17  5:01 AM  Result Value Ref Range Status   Specimen Description BLOOD LEFT ANTECUBITAL  Final   Special Requests   Final    BOTTLES DRAWN AEROBIC AND ANAEROBIC Blood Culture adequate volume   Culture NO GROWTH 1 DAY  Final   Report Status PENDING  Incomplete  CULTURE, BLOOD (ROUTINE X 2) w Reflex to ID Panel     Status: None (Preliminary result)   Collection Time: 05/31/17  5:10 AM  Result Value Ref Range Status   Specimen Description BLOOD LEFT HAND  Final   Special Requests   Final    BOTTLES DRAWN AEROBIC AND ANAEROBIC Blood Culture adequate volume   Culture NO GROWTH 1 DAY  Final   Report Status PENDING  Incomplete  CULTURE, BLOOD (ROUTINE X 2) w Reflex to ID Panel     Status: None (Preliminary result)   Collection Time: 06/01/17  4:50 AM  Result Value Ref Range Status   Specimen Description BLOOD LEFT AC  Final   Special Requests   Final    BOTTLES DRAWN AEROBIC AND ANAEROBIC Blood Culture adequate volume   Culture NO GROWTH <12 HOURS  Final   Report Status PENDING  Incomplete  CULTURE, BLOOD (ROUTINE X 2) w Reflex to ID Panel     Status: None (Preliminary result)   Collection Time: 06/01/17  4:57 AM  Result Value Ref Range Status   Specimen Description BLOOD LEFT HAND  Final   Special Requests   Final    BOTTLES DRAWN AEROBIC AND ANAEROBIC Blood Culture adequate volume   Culture NO GROWTH <12 HOURS  Final   Report Status PENDING  Incomplete    Studies/Results: Study Conclusions ECHO 05/30/17 - Left ventricle: Systolic function was mildly reduced. The   estimated ejection fraction was in the range of 45% to 50%. - Aortic valve: There was trivial regurgitation. - Mitral valve: There was mild regurgitation. - Tricuspid valve: There was moderate regurgitation.  Assessment/Plan: [name] is a 56 y.o. male with MRSA bacteremia from infected Portacath,  now s/p removal on 11/30.  He had cath removed 11/30 and fu bcx neg. TTE neg but has mod TR.  No other signs of metastatic sites of infection but he remains very confused  Recommendations MRSA bacteremia from infected Portacath -Cath removed 11/30 and fu bcx neg Continue vancomycin. - Can place picc line He would benefit from a TEE to rule out endocarditis but he is too confused currently and not suitable for this procedure.   -Will need 2 weeks min of IV Vancomycin once he clears his bacteremia.   Thank you very much for the consult. Will follow with you.  Leonel Ramsay   06/01/2017, 2:26 PM

## 2017-06-01 NOTE — Progress Notes (Signed)
Patient ID: [name], male   DOB: 1960-08-15, 56 y.o.   MRN: [id number]  [facility] PROGRESS NOTE  [place] YKD:[id number] DOB: 05/18/1961 DOA: 05/28/2017 PCP: [name], MD  HPI/Subjective: Patient seen this morning - some confused, but stable. As per girl friend, much improved and asking for food. Remains confused. Objective: Vitals:   06/01/17 1003 06/01/17 1217  BP: 110/74 (!) 146/78  Pulse: 64 65  Resp: 14 20  Temp: 97.8 F (36.6 C) 97.7 F (36.5 C)  SpO2: 100% 99%    Intake/Output Summary (Last 24 hours) at 06/01/2017 1730 Last data filed at 06/01/2017 1645 Gross per 24 hour  Intake 1808.75 ml  Output 500 ml  Net 1308.75 ml   Filed Weights   05/28/17 1720 05/29/17 1630 05/30/17 1000  Weight: 47.6 kg (105 lb) 56 kg (123 lb 7.3 oz) 59 kg (130 lb 1.1 oz)    ROS: Review of Systems  Unable to perform ROS: Acuity of condition   Exam: Physical Exam  HENT:  Nose: No mucosal edema.  Mouth/Throat: No oropharyngeal exudate or posterior oropharyngeal edema.  Eyes: Conjunctivae and lids are normal. Pupils are equal, round, and reactive to light.  Neck: No JVD present. Carotid bruit is not present. No edema present. No thyroid mass and no thyromegaly present.  Cardiovascular: S1 normal and S2 normal. Exam reveals no gallop.  No murmur heard. Pulses:      Dorsalis pedis pulses are 2+ on the right side, and 2+ on the left side.  Respiratory: No respiratory distress. He has no wheezes. He has no rhonchi. He has no rales.  GI: Soft. Bowel sounds are normal. There is no tenderness.  Musculoskeletal:       Right ankle: He exhibits no swelling.       Left ankle: He exhibits no swelling.  Lymphadenopathy:    He has no cervical adenopathy.  Neurological: He is alert.  Skin: Skin is warm. Nails show no clubbing.  Dressing on right upper chest.  Psychiatric:  He is awake, but appears some confused.      Data Reviewed: Basic Metabolic Panel: Recent  Labs  Lab 05/29/17 0352 05/29/17 1806 05/30/17 0525 05/31/17 0501 05/31/17 1444 06/01/17 0450  NA 139 140 137 138  --  138  K 2.7* 2.9* 4.0 2.8* 3.3* 3.1*  CL 107 110 112* 115*  --  117*  CO2 25 25 20* 19*  --  18*  GLUCOSE 106* 123* 137* 96  --  125*  BUN 7 6 7 6   --  <5*  CREATININE 0.56* 0.53* 0.41* 0.33*  --  0.48*  CALCIUM 7.9* 7.6* 7.5* 7.5*  --  7.6*  MG  --  1.7 2.0 1.9  --  1.8  PHOS  --  3.2 2.8 2.8  --  3.2   Liver Function Tests: Recent Labs  Lab 05/28/17 1820 05/29/17 0352 05/29/17 1806  AST 23 23 31   ALT 20 16* 20  ALKPHOS 142* 125 154*  BILITOT 0.3 0.5 0.5  PROT 6.3* 5.2* 5.7*  ALBUMIN 2.7* 2.1* 2.2*   CBC: Recent Labs  Lab 05/28/17 1820 05/29/17 0352 05/29/17 1806 05/30/17 0525 05/31/17 0501 06/01/17 0450  WBC 23.7* 18.3* 14.6* 12.1* 7.6 6.5  NEUTROABS 20.6*  --   --   --   --   --   HGB 9.4* 8.1* 9.1* 8.8* 8.6* 8.3*  HCT 28.5* 24.5* 27.0* 27.0* 26.2* 25.4*  MCV 93.2 93.6 93.5 94.2 93.6 93.1  PLT 172 156 166 163 145* 121*   Cardiac Enzymes: Recent Labs  Lab 05/28/17 1820  TROPONINI 0.07*    CBG: Recent Labs  Lab 05/31/17 1139 05/31/17 1738 06/01/17 0018 06/01/17 0605 06/01/17 1132  GLUCAP 83 179* 135* 112* 189*    Recent Results (from the past 240 hour(s))  Culture, blood (Routine x 2)     Status: Abnormal   Collection Time: 05/28/17  6:21 PM  Result Value Ref Range Status   Specimen Description BLOOD BLOOD LEFT ARM  Final   Special Requests   Final    BOTTLES DRAWN AEROBIC AND ANAEROBIC Blood Culture adequate volume   Culture  Setup Time   Final    GRAM POSITIVE COCCI IN BOTH AEROBIC AND ANAEROBIC BOTTLES CRITICAL RESULT CALLED TO, READ BACK BY AND VERIFIED WITH: KRISTIN MERRILL AT 0814 05/29/17 SDR    Culture METHICILLIN RESISTANT STAPHYLOCOCCUS AUREUS (A)  Final   Report Status 05/31/2017 FINAL  Final   Organism ID, Bacteria METHICILLIN RESISTANT STAPHYLOCOCCUS AUREUS  Final      Susceptibility   Methicillin  resistant staphylococcus aureus - MIC*    CIPROFLOXACIN >=8 RESISTANT Resistant     ERYTHROMYCIN >=8 RESISTANT Resistant     GENTAMICIN <=0.5 SENSITIVE Sensitive     OXACILLIN >=4 RESISTANT Resistant     TETRACYCLINE <=1 SENSITIVE Sensitive     VANCOMYCIN <=0.5 SENSITIVE Sensitive     TRIMETH/SULFA <=10 SENSITIVE Sensitive     CLINDAMYCIN RESISTANT Resistant     RIFAMPIN <=0.5 SENSITIVE Sensitive     Inducible Clindamycin POSITIVE Resistant     * METHICILLIN RESISTANT STAPHYLOCOCCUS AUREUS  Blood Culture ID Panel (Reflexed)     Status: Abnormal   Collection Time: 05/28/17  6:21 PM  Result Value Ref Range Status   Enterococcus species NOT DETECTED NOT DETECTED Final   Listeria monocytogenes NOT DETECTED NOT DETECTED Final   Staphylococcus species DETECTED (A) NOT DETECTED Final    Comment: CRITICAL RESULT CALLED TO, READ BACK BY AND VERIFIED WITH:  KRISTIN MERRILL AT 4818 05/29/17 SDR    Staphylococcus aureus DETECTED (A) NOT DETECTED Final    Comment: Methicillin (oxacillin)-resistant Staphylococcus aureus (MRSA). MRSA is predictably resistant to beta-lactam antibiotics (except ceftaroline). Preferred therapy is vancomycin unless clinically contraindicated. Patient requires contact precautions if  hospitalized. CRITICAL RESULT CALLED TO, READ BACK BY AND VERIFIED WITH:  KRISTIN MERRILL AT 5631 05/29/17 SDR    Methicillin resistance DETECTED (A) NOT DETECTED Final    Comment: CRITICAL RESULT CALLED TO, READ BACK BY AND VERIFIED WITH:  KRISTIN MERRILL AT 4970 05/29/17 SDR    Streptococcus species NOT DETECTED NOT DETECTED Final   Streptococcus agalactiae NOT DETECTED NOT DETECTED Final   Streptococcus pneumoniae NOT DETECTED NOT DETECTED Final   Streptococcus pyogenes NOT DETECTED NOT DETECTED Final   Acinetobacter baumannii NOT DETECTED NOT DETECTED Final   Enterobacteriaceae species NOT DETECTED NOT DETECTED Final   Enterobacter cloacae complex NOT DETECTED NOT DETECTED Final    Escherichia coli NOT DETECTED NOT DETECTED Final   Klebsiella oxytoca NOT DETECTED NOT DETECTED Final   Klebsiella pneumoniae NOT DETECTED NOT DETECTED Final   Proteus species NOT DETECTED NOT DETECTED Final   Serratia marcescens NOT DETECTED NOT DETECTED Final   Haemophilus influenzae NOT DETECTED NOT DETECTED Final   Neisseria meningitidis NOT DETECTED NOT DETECTED Final   Pseudomonas aeruginosa NOT DETECTED NOT DETECTED Final   Candida albicans NOT DETECTED NOT DETECTED Final   Candida glabrata NOT DETECTED NOT DETECTED Final  Candida krusei NOT DETECTED NOT DETECTED Final   Candida parapsilosis NOT DETECTED NOT DETECTED Final   Candida tropicalis NOT DETECTED NOT DETECTED Final  Culture, blood (Routine x 2)     Status: Abnormal   Collection Time: 05/28/17  7:10 PM  Result Value Ref Range Status   Specimen Description BLOOD LEFT ANTECUBITAL  Final   Special Requests   Final    BOTTLES DRAWN AEROBIC AND ANAEROBIC Blood Culture adequate volume   Culture  Setup Time   Final    GRAM POSITIVE COCCI IN BOTH AEROBIC AND ANAEROBIC BOTTLES CRITICAL VALUE NOTED.  VALUE IS CONSISTENT WITH PREVIOUSLY REPORTED AND CALLED VALUE.    Culture (A)  Final    STAPHYLOCOCCUS AUREUS SUSCEPTIBILITIES PERFORMED ON PREVIOUS CULTURE WITHIN THE LAST 5 DAYS. Performed at [facility], [place] [address] [place], [place] [address]    Report Status 05/31/2017 FINAL  Final  Surgical pcr screen     Status: Abnormal   Collection Time: 05/28/17 10:44 PM  Result Value Ref Range Status   MRSA, PCR POSITIVE (A) NEGATIVE Final    Comment: RESULT CALLED TO, READ BACK BY AND VERIFIED WITH: MAT PAGE ON 05/29/17 AT 0093 BY JAG    Staphylococcus aureus POSITIVE (A) NEGATIVE Final    Comment: RESULT CALLED TO, READ BACK BY AND VERIFIED WITH: MAT PAGE ON 05/29/17 AT 0213 BY JAG (NOTE) The Xpert SA Assay (FDA approved for NASAL specimens in patients 7 years of age and older), is one component of a  comprehensive surveillance program. It is not intended to diagnose infection nor to guide or monitor treatment.   Aerobic/Anaerobic Culture (surgical/deep wound)     Status: None (Preliminary result)   Collection Time: 05/29/17  1:31 PM  Result Value Ref Range Status   Specimen Description WOUND RIGHT UPPER CHEST  Final   Special Requests NONE  Final   Gram Stain   Final    FEW WBC PRESENT, PREDOMINANTLY PMN FEW GRAM POSITIVE COCCI IN CLUSTERS Performed at [facility], [place] [address] [place], [place] [address]    Culture   Final    FEW METHICILLIN RESISTANT STAPHYLOCOCCUS AUREUS NO ANAEROBES ISOLATED; CULTURE IN PROGRESS FOR 5 DAYS    Report Status PENDING  Incomplete   Organism ID, Bacteria METHICILLIN RESISTANT STAPHYLOCOCCUS AUREUS  Final      Susceptibility   Methicillin resistant staphylococcus aureus - MIC*    CIPROFLOXACIN >=8 RESISTANT Resistant     ERYTHROMYCIN >=8 RESISTANT Resistant     GENTAMICIN <=0.5 SENSITIVE Sensitive     OXACILLIN >=4 RESISTANT Resistant     TETRACYCLINE <=1 SENSITIVE Sensitive     VANCOMYCIN <=0.5 SENSITIVE Sensitive     TRIMETH/SULFA <=10 SENSITIVE Sensitive     CLINDAMYCIN RESISTANT Resistant     RIFAMPIN <=0.5 SENSITIVE Sensitive     Inducible Clindamycin POSITIVE Resistant     * FEW METHICILLIN RESISTANT STAPHYLOCOCCUS AUREUS  Culture, blood (single) w Reflex to ID Panel     Status: None (Preliminary result)   Collection Time: 05/29/17  6:06 PM  Result Value Ref Range Status   Specimen Description BLOOD LEFT ASSIST CONTROL  Final   Special Requests   Final    BOTTLES DRAWN AEROBIC AND ANAEROBIC Blood Culture adequate volume   Culture NO GROWTH 3 DAYS  Final   Report Status PENDING  Incomplete  CULTURE, BLOOD (ROUTINE X 2) w Reflex to ID Panel     Status: None (Preliminary result)  Collection Time: 05/30/17 11:35 AM  Result Value Ref Range Status   Specimen Description BLOOD LEFT ASSIST CONTROL  Final   Special Requests    Final    BOTTLES DRAWN AEROBIC AND ANAEROBIC Blood Culture adequate volume   Culture NO GROWTH 2 DAYS  Final   Report Status PENDING  Incomplete  CULTURE, BLOOD (ROUTINE X 2) w Reflex to ID Panel     Status: None (Preliminary result)   Collection Time: 05/30/17 11:59 AM  Result Value Ref Range Status   Specimen Description BLOOD LEFT HAND  Final   Special Requests   Final    BOTTLES DRAWN AEROBIC AND ANAEROBIC Blood Culture adequate volume   Culture NO GROWTH 2 DAYS  Final   Report Status PENDING  Incomplete  CULTURE, BLOOD (ROUTINE X 2) w Reflex to ID Panel     Status: None (Preliminary result)   Collection Time: 05/31/17  5:01 AM  Result Value Ref Range Status   Specimen Description BLOOD LEFT ANTECUBITAL  Final   Special Requests   Final    BOTTLES DRAWN AEROBIC AND ANAEROBIC Blood Culture adequate volume   Culture NO GROWTH 1 DAY  Final   Report Status PENDING  Incomplete  CULTURE, BLOOD (ROUTINE X 2) w Reflex to ID Panel     Status: None (Preliminary result)   Collection Time: 05/31/17  5:10 AM  Result Value Ref Range Status   Specimen Description BLOOD LEFT HAND  Final   Special Requests   Final    BOTTLES DRAWN AEROBIC AND ANAEROBIC Blood Culture adequate volume   Culture NO GROWTH 1 DAY  Final   Report Status PENDING  Incomplete  CULTURE, BLOOD (ROUTINE X 2) w Reflex to ID Panel     Status: None (Preliminary result)   Collection Time: 06/01/17  4:50 AM  Result Value Ref Range Status   Specimen Description BLOOD LEFT AC  Final   Special Requests   Final    BOTTLES DRAWN AEROBIC AND ANAEROBIC Blood Culture adequate volume   Culture NO GROWTH <12 HOURS  Final   Report Status PENDING  Incomplete  CULTURE, BLOOD (ROUTINE X 2) w Reflex to ID Panel     Status: None (Preliminary result)   Collection Time: 06/01/17  4:57 AM  Result Value Ref Range Status   Specimen Description BLOOD LEFT HAND  Final   Special Requests   Final    BOTTLES DRAWN AEROBIC AND ANAEROBIC Blood  Culture adequate volume   Culture NO GROWTH <12 HOURS  Final   Report Status PENDING  Incomplete     Studies: No results found.  Scheduled Meds: . Chlorhexidine Gluconate Cloth  6 each Topical Q0600  . enoxaparin (LOVENOX) injection  40 mg Subcutaneous Q24H  . folic acid  1 mg Oral Daily  . insulin aspart  0-9 Units Subcutaneous Q6H  . multivitamin with minerals  1 tablet Oral Daily  . mupirocin ointment  1 application Nasal BID  . pantoprazole (PROTONIX) IV  40 mg Intravenous Q24H  . potassium chloride  40 mEq Oral BID  . thiamine  100 mg Oral Daily   Or  . thiamine  100 mg Intravenous Daily   Continuous Infusions: . potassium chloride    . vancomycin      Assessment/Plan:  1. Sepsis with staph aureus and hypotension postoperatively after removing port.  Leukocytosis, tachycardia and lactic acidosis.  Continue IV vancomycin.  Improved with fluids in stepdown, moved to floor now. Stable.  ID consult appreciated. 2. Hypokalemia replace potassium IV and orally.  IV magnesium also given. 3. Anemia.  Due to chronic disease and colon cancer, monitor. 4. History of alcohol abuse on CIWA protocol- may be this is the reason for his confusion. 5. Type 2 diabetes on sliding scale. 6. Malignant neoplasm of sigmoid colon 7. Moderate protein calorie malnutrition 8. History of pancreatic insufficiency 9. Altered mental status- may be due to sepsis or due to alcohol withdrawal. Monitor.- more alert today. 10.    Cont thiamin and folic acid.  Code Status:     Code Status Orders  (From admission, onward)        Start     Ordered   05/28/17 2204  Full code  Continuous     05/28/17 2203    Code Status History    Date Active Date Inactive Code Status Order ID Comments User Context   12/01/2016 02:45 12/04/2016 16:34 Full Code [id number]  Lance Coon, MD Inpatient   05/12/2015 22:39 05/14/2015 17:23 Full Code [id number]  Lance Coon, MD Inpatient     Family Communication:   Disposition Plan: may be in 2-3 days.  Consultants:  General surgery  Critical care specialist  Procedures:  Port-A-Cath removal  Antibiotics:  IV vancomycin  Time spent: 32 minutes  [employer]

## 2017-06-01 NOTE — Care Management Note (Signed)
Case Management Note  Patient Details  Name: [name] MRN: [id number] Date of Birth: 09-03-1960  Subjective/Objective:  Advanced notified of home IV vancomycin need. Following progression.                    Action/Plan:   Expected Discharge Date:  05/30/17               Expected Discharge Plan:  [place]  In-House Referral:     Discharge planning Services  CM Consult  Post Acute Care Choice:  Home Health Choice offered to:  Patient  DME Arranged:    DME Agency:     HH Arranged:    [place] Agency:  [facility]  Status of Service:  In process, will continue to follow  If discussed at Long Length of Stay Meetings, dates discussed:    Additional Comments:  Jolly Mango, RN 06/01/2017, 2:13 PM

## 2017-06-01 NOTE — Plan of Care (Signed)
  Progressing Education: Knowledge of General Education information will improve 06/01/2017 0351 - Progressing by Afomia Blackley, Lucille Passy, RN Health Behavior/Discharge Planning: Ability to manage health-related needs will improve 06/01/2017 0351 - Progressing by Analena Gama, Lucille Passy, RN Clinical Measurements: Ability to maintain clinical measurements within normal limits will improve 06/01/2017 0351 - Progressing by Bryam Taborda, Lucille Passy, RN Will remain free from infection 06/01/2017 0351 - Progressing by Deangela Randleman, Lucille Passy, RN Diagnostic test results will improve 06/01/2017 0351 - Progressing by Haywood Meinders, Lucille Passy, RN Respiratory complications will improve 06/01/2017 0351 - Progressing by Bryna Colander, RN Cardiovascular complication will be avoided 06/01/2017 0351 - Progressing by Bryna Colander, RN Activity: Risk for activity intolerance will decrease 06/01/2017 0351 - Progressing by Bryna Colander, RN Nutrition: Adequate nutrition will be maintained 06/01/2017 0351 - Progressing by Bryna Colander, RN Coping: Level of anxiety will decrease 06/01/2017 0351 - Progressing by Bryna Colander, RN Elimination: Will not experience complications related to bowel motility 06/01/2017 0351 - Progressing by Bryna Colander, RN Will not experience complications related to urinary retention 06/01/2017 0351 - Progressing by Maryam Feely, Lucille Passy, RN Pain Managment: General experience of comfort will improve 06/01/2017 0351 - Progressing by Conn Trombetta, Lucille Passy, RN Safety: Ability to remain free from injury will improve 06/01/2017 0351 - Progressing by Naila Elizondo, Lucille Passy, RN Skin Integrity: Risk for impaired skin integrity will decrease 06/01/2017 0351 - Progressing by Bryna Colander, RN Fluid Volume: Hemodynamic stability will improve 06/01/2017 0351 - Progressing by Bryna Colander, RN Clinical Measurements: Diagnostic test results will improve 06/01/2017 0351 -  Progressing by Amori Colomb, Lucille Passy, RN Signs and symptoms of infection will decrease 06/01/2017 0351 - Progressing by Bryna Colander, RN Respiratory: Ability to maintain adequate ventilation will improve 06/01/2017 0351 - Progressing by Blase Beckner, Lucille Passy, RN

## 2017-06-02 ENCOUNTER — Inpatient Hospital Stay: Payer: Medicaid Other

## 2017-06-02 DIAGNOSIS — F0280 Dementia in other diseases classified elsewhere without behavioral disturbance: Secondary | ICD-10-CM

## 2017-06-02 LAB — BASIC METABOLIC PANEL
Anion gap: 7 (ref 5–15)
CALCIUM: 8 mg/dL — AB (ref 8.9–10.3)
CHLORIDE: 113 mmol/L — AB (ref 101–111)
CO2: 18 mmol/L — AB (ref 22–32)
CREATININE: 0.53 mg/dL — AB (ref 0.61–1.24)
GFR calc non Af Amer: >60 mL/min (ref >60)
Glucose, Bld: 96 mg/dL (ref 65–99)
Potassium: 3.4 mmol/L — ABNORMAL LOW (ref 3.5–5.1)
Sodium: 138 mmol/L (ref 135–145)

## 2017-06-02 LAB — CBC
HCT: 30 % — ABNORMAL LOW (ref 40.0–52.0)
Hemoglobin: 9.9 g/dL — ABNORMAL LOW (ref 13.0–18.0)
MCH: 30.4 pg (ref 26.0–34.0)
MCHC: 33 g/dL (ref 32.0–36.0)
MCV: 92.3 fL (ref 80.0–100.0)
PLATELETS: 153 10*3/uL (ref 150–440)
RBC: 3.25 MIL/uL — AB (ref 4.40–5.90)
RDW: 15.1 % — AB (ref 11.5–14.5)
WBC: 17.5 10*3/uL — ABNORMAL HIGH (ref 3.8–10.6)

## 2017-06-02 LAB — GLUCOSE, CAPILLARY
GLUCOSE-CAPILLARY: 120 mg/dL — AB (ref 65–99)
Glucose-Capillary: 94 mg/dL (ref 65–99)
Glucose-Capillary: 135 mg/dL — ABNORMAL HIGH (ref 65–99)
Glucose-Capillary: 195 mg/dL — ABNORMAL HIGH (ref 65–99)

## 2017-06-02 LAB — MAGNESIUM: MAGNESIUM: 1.7 mg/dL (ref 1.7–2.4)

## 2017-06-02 LAB — PHOSPHORUS: Phosphorus: 3 mg/dL (ref 2.5–4.6)

## 2017-06-02 LAB — TSH: TSH: 5.257 u[IU]/mL — ABNORMAL HIGH (ref 0.350–4.500)

## 2017-06-02 MED ORDER — PIPERACILLIN-TAZOBACTAM 3.375 G IVPB
3.3750 g | Freq: Three times a day (TID) | INTRAVENOUS | Status: DC
  Administered 2017-06-02 – 2017-06-04 (×6): 3.375 g via INTRAVENOUS
  Filled 2017-06-02 (×5): qty 50

## 2017-06-02 MED ORDER — SODIUM CHLORIDE 0.9 % IV BOLUS (SEPSIS)
1000.0000 mL | Freq: Once | INTRAVENOUS | Status: AC
  Administered 2017-06-02: 1000 mL via INTRAVENOUS

## 2017-06-02 NOTE — Plan of Care (Signed)
  Progressing Education: Knowledge of General Education information will improve 06/02/2017 0457 - Progressing by Chung Chagoya, Lucille Passy, RN Health Behavior/Discharge Planning: Ability to manage health-related needs will improve 06/02/2017 0457 - Progressing by Kieana Livesay, Lucille Passy, RN Clinical Measurements: Ability to maintain clinical measurements within normal limits will improve 06/02/2017 0457 - Progressing by Kynsleigh Westendorf, Lucille Passy, RN Will remain free from infection 06/02/2017 0457 - Progressing by Elijah Phommachanh, Lucille Passy, RN Diagnostic test results will improve 06/02/2017 0457 - Progressing by British Moyd, Lucille Passy, RN Respiratory complications will improve 06/02/2017 0457 - Progressing by Bryna Colander, RN Cardiovascular complication will be avoided 06/02/2017 0457 - Progressing by Bryna Colander, RN Activity: Risk for activity intolerance will decrease 06/02/2017 0457 - Progressing by Bryna Colander, RN Nutrition: Adequate nutrition will be maintained 06/02/2017 0457 - Progressing by Bryna Colander, RN Coping: Level of anxiety will decrease 06/02/2017 0457 - Progressing by Bryna Colander, RN Elimination: Will not experience complications related to bowel motility 06/02/2017 0457 - Progressing by Bryna Colander, RN Will not experience complications related to urinary retention 06/02/2017 0457 - Progressing by Breonna Gafford, Lucille Passy, RN Pain Managment: General experience of comfort will improve 06/02/2017 0457 - Progressing by Napoleon Monacelli, Lucille Passy, RN Safety: Ability to remain free from injury will improve 06/02/2017 0457 - Progressing by Delaine Hernandez, Lucille Passy, RN Skin Integrity: Risk for impaired skin integrity will decrease 06/02/2017 0457 - Progressing by Bryna Colander, RN Fluid Volume: Hemodynamic stability will improve 06/02/2017 0457 - Progressing by Bryna Colander, RN Clinical Measurements: Diagnostic test results will improve 06/02/2017 0457 -  Progressing by Fredrick Geoghegan, Lucille Passy, RN Signs and symptoms of infection will decrease 06/02/2017 0457 - Progressing by Bryna Colander, RN Respiratory: Ability to maintain adequate ventilation will improve 06/02/2017 0457 - Progressing by Nickalos Petersen, Lucille Passy, RN

## 2017-06-02 NOTE — Progress Notes (Signed)
MEDICATION RELATED CONSULT NOTE -  Pharmacy Consult for electrolyte management for 56 yo male admitted with infected port. Patient with hypokalemia. Patient currently in surgery. Patient received potassium 79mEq IV x 4 and magnesium 2g IV 1. Patient is receiving NS/16mEq of Potassium at 20mL/hr.   Plan:  Will order BMP/Magnesium/Phosphorus at 2000.   05/29/2017 1806 K 2.9, Mg 1.7, phos 3.2. CCM has ordered magnesium 1 gm IV x 1 and KCl 10 mEq IV Q1H x 6 doses. Will continue current regimen and recheck electrolytes tomorrow with AM labs.   12/1 K 4.0, phos 2.8, Mag 2.0 -  No supp needed at this time. Will recheck labs in AM.   12/2 1444 K 3.3 Will continue current supplement order of Klor-Con 40 mEq po BID, give additional 10 mEq IV Q1H x 2 doses, and recheck electrolytes tomorrow with AM labs.   12/3: K= 3.1. Will continue current supplementation of KCl 40 mEq PO BID. Will give an additional 10 mEq IV x 3 doses IV and recheck electrolytes in am.  12/4: k= 3.4. Will continue current supplemetation of KCl 40 mEq PO BID. Since lab value is close to therapeutic. Will not supplement today. Will recheck in am.    Allergies  Allergen Reactions  . Aspirin Itching and Other (See Comments)    Reaction: abdominal pain    Patient Measurements: Height: 5\' 8"  (172.7 cm) Weight: 130 lb 1.1 oz (59 kg) IBW/kg (Calculated) : 68.4  Vital Signs: BP: 96/56 (12/03 2030) Pulse Rate: 68 (12/03 2030) Intake/Output from previous day: 12/03 0701 - 12/04 0700 In: 450 [P.O.:100; I.V.:200; IV Piggyback:150] Out: 1600 [Urine:1600] Intake/Output from this shift: No intake/output data recorded.  Labs: Recent Labs    05/31/17 0501 06/01/17 0450 06/02/17 0450  WBC 7.6 6.5 17.5*  HGB 8.6* 8.3* 9.9*  HCT 26.2* 25.4* 30.0*  PLT 145* 121* 153  CREATININE 0.33* 0.48* 0.53*  MG 1.9 1.8 1.7  PHOS 2.8 3.2 3.0   Estimated Creatinine Clearance: 85 mL/min (A) (by C-G formula based on SCr of 0.53 mg/dL  (L)).    Medical History: Past Medical History:  Diagnosis Date  . Anemia   . BPH without obstruction/lower urinary tract symptoms   . Cancer ([place])   . Cellulitis of scrotum   . Cyst of prostate 05/13/2015  . Diabetes ([place])   . Difficulty urinating   . Dyspnea   . Epididymoorchitis   . ETOH abuse   . Fracture of right tibia and fibula   . GERD (gastroesophageal reflux disease)   . GI bleed   . Malignant neoplasm of sigmoid colon ([place])   . Pancreatitis   . Trichomoniasis   . Wears dentures    Partial upper and lower.  reports poor fit.    Pharmacy will continue to monitor and adjust per consult.   Quince Santana D, Pharm.D., BCPS Clinical Pharmacist 06/02/2017,7:53 AM

## 2017-06-02 NOTE — Progress Notes (Signed)
Spoke with Anderson Malta, RN bedside nurse regarding PICC and consent issues.  Anderson Malta states that MD has ordered psychiatric consult to determine if he is able to give his own consent or not.  Patient status has also changed with increasing WBC and decreased temperatures at this time.  Patient currently has IV for medications.  Agreement made to hold off on PICC at present as patient is not discharging today and await consult to be finished.  This RN would also prefer to wait until Infectious Disease sees patient today and gives go ahead with this change in status.  Anderson Malta, RN to contact [facility] if patient is need of IV access or if decisions are made regarding consent situation.  Carolee Rota, RN [facility]

## 2017-06-02 NOTE — Progress Notes (Signed)
Noted ID follow up and lab results. Xray shows new infiltrates- ID added [id number] to cover for aspiration PNA Also ordered TEE.

## 2017-06-02 NOTE — Progress Notes (Signed)
[facility] PROGRESS NOTE Date of Admission:  05/28/2017     ID: [name] is a 56 y.o. male with portqath infection   Principal Problem:   Sepsis ([place]) Active Problems:   Type 2 diabetes mellitus (HCC)   ETOH abuse   BPH (benign prostatic hyperplasia)  Subjective: Much less confused, hypothermia, wbc up. CXR Patchy airspace disease.  ROS  Unable to obtain   Medications:  Antibiotics Given (last 72 hours)    Date/Time Action Medication Dose Rate   05/30/17 1534 New Bag/Given   vancomycin (VANCOCIN) IVPB 750 mg/150 ml premix 750 mg 150 mL/hr   05/31/17 0219 New Bag/Given   vancomycin (VANCOCIN) IVPB 750 mg/150 ml premix 750 mg 150 mL/hr   05/31/17 1555 New Bag/Given   vancomycin (VANCOCIN) IVPB 750 mg/150 ml premix 750 mg 150 mL/hr   06/01/17 0120 New Bag/Given  [patient pull iv out]   vancomycin (VANCOCIN) IVPB 750 mg/150 ml premix 750 mg 150 mL/hr   06/01/17 2243 New Bag/Given   vancomycin (VANCOCIN) IVPB 750 mg/150 ml premix 750 mg 150 mL/hr   06/02/17 1238 New Bag/Given   vancomycin (VANCOCIN) IVPB 750 mg/150 ml premix 750 mg 150 mL/hr     . Chlorhexidine Gluconate Cloth  6 each Topical Q0600  . enoxaparin (LOVENOX) injection  40 mg Subcutaneous Q24H  . folic acid  1 mg Oral Daily  . insulin aspart  0-9 Units Subcutaneous Q6H  . multivitamin with minerals  1 tablet Oral Daily  . mupirocin ointment  1 application Nasal BID  . pantoprazole (PROTONIX) IV  40 mg Intravenous Q24H  . potassium chloride  40 mEq Oral BID  . thiamine  100 mg Oral Daily   Or  . thiamine  100 mg Intravenous Daily    Objective: Vital signs in last 24 hours: Temp:  [94.4 F (34.7 C)-95.6 F (35.3 C)] 95.6 F (35.3 C) (12/04 0900) Pulse Rate:  [62-68] 62 (12/04 0700) Resp:  [16] 16 (12/03 2030) BP: (94-96)/(56-62) 94/62 (12/04 0700) SpO2:  [97 %] 97 % (12/03 2030) Constitutional: sitting up in bed, slowed mentation  HENT: anicteric Mouth/Throat: oroarynx is   dry. No oropharyngeal exudate.  Cardiovascular: Normal rate, regular rhythm and normal heart sounds.  Pulmonary/Chest: Effort normal and breath sounds normal. No respiratory distress. He has no wheezes.  ANt chest wall PC site with healing wound Abdominal: Soft. Bowel sounds are normal. He exhibits no distension. There is no tenderness.  Lymphadenopathy: He has no cervical adenopathy.  Neurological: lethargic Skin: Skin is warm and dry. No rash noted. No erythema.  Psychiatric: slowed mentation    Lab Results Recent Labs    06/01/17 0450 06/02/17 0450  WBC 6.5 17.5*  HGB 8.3* 9.9*  HCT 25.4* 30.0*  NA 138 138  K 3.1* 3.4*  CL 117* 113*  CO2 18* 18*  BUN <5* <5*  CREATININE 0.48* 0.53*    Microbiology: Results for orders placed or performed during the hospital encounter of 05/28/17  Culture, blood (Routine x 2)     Status: Abnormal   Collection Time: 05/28/17  6:21 PM  Result Value Ref Range Status   Specimen Description BLOOD BLOOD LEFT ARM  Final   Special Requests   Final    BOTTLES DRAWN AEROBIC AND ANAEROBIC Blood Culture adequate volume   Culture  Setup Time   Final    GRAM POSITIVE COCCI IN BOTH AEROBIC AND ANAEROBIC BOTTLES CRITICAL RESULT CALLED TO, READ BACK BY AND VERIFIED  WITH: KRISTIN MERRILL AT 9678 05/29/17 SDR    Culture METHICILLIN RESISTANT STAPHYLOCOCCUS AUREUS (A)  Final   Report Status 05/31/2017 FINAL  Final   Organism ID, Bacteria METHICILLIN RESISTANT STAPHYLOCOCCUS AUREUS  Final      Susceptibility   Methicillin resistant staphylococcus aureus - MIC*    CIPROFLOXACIN >=8 RESISTANT Resistant     ERYTHROMYCIN >=8 RESISTANT Resistant     GENTAMICIN <=0.5 SENSITIVE Sensitive     OXACILLIN >=4 RESISTANT Resistant     TETRACYCLINE <=1 SENSITIVE Sensitive     VANCOMYCIN <=0.5 SENSITIVE Sensitive     TRIMETH/SULFA <=10 SENSITIVE Sensitive     CLINDAMYCIN RESISTANT Resistant     RIFAMPIN <=0.5 SENSITIVE Sensitive     Inducible Clindamycin POSITIVE  Resistant     * METHICILLIN RESISTANT STAPHYLOCOCCUS AUREUS  Blood Culture ID Panel (Reflexed)     Status: Abnormal   Collection Time: 05/28/17  6:21 PM  Result Value Ref Range Status   Enterococcus species NOT DETECTED NOT DETECTED Final   Listeria monocytogenes NOT DETECTED NOT DETECTED Final   Staphylococcus species DETECTED (A) NOT DETECTED Final    Comment: CRITICAL RESULT CALLED TO, READ BACK BY AND VERIFIED WITH:  KRISTIN MERRILL AT 9381 05/29/17 SDR    Staphylococcus aureus DETECTED (A) NOT DETECTED Final    Comment: Methicillin (oxacillin)-resistant Staphylococcus aureus (MRSA). MRSA is predictably resistant to beta-lactam antibiotics (except ceftaroline). Preferred therapy is vancomycin unless clinically contraindicated. Patient requires contact precautions if  hospitalized. CRITICAL RESULT CALLED TO, READ BACK BY AND VERIFIED WITH:  KRISTIN MERRILL AT 0175 05/29/17 SDR    Methicillin resistance DETECTED (A) NOT DETECTED Final    Comment: CRITICAL RESULT CALLED TO, READ BACK BY AND VERIFIED WITH:  KRISTIN MERRILL AT 1025 05/29/17 SDR    Streptococcus species NOT DETECTED NOT DETECTED Final   Streptococcus agalactiae NOT DETECTED NOT DETECTED Final   Streptococcus pneumoniae NOT DETECTED NOT DETECTED Final   Streptococcus pyogenes NOT DETECTED NOT DETECTED Final   Acinetobacter baumannii NOT DETECTED NOT DETECTED Final   Enterobacteriaceae species NOT DETECTED NOT DETECTED Final   Enterobacter cloacae complex NOT DETECTED NOT DETECTED Final   Escherichia coli NOT DETECTED NOT DETECTED Final   Klebsiella oxytoca NOT DETECTED NOT DETECTED Final   Klebsiella pneumoniae NOT DETECTED NOT DETECTED Final   Proteus species NOT DETECTED NOT DETECTED Final   Serratia marcescens NOT DETECTED NOT DETECTED Final   Haemophilus influenzae NOT DETECTED NOT DETECTED Final   Neisseria meningitidis NOT DETECTED NOT DETECTED Final   Pseudomonas aeruginosa NOT DETECTED NOT DETECTED Final    Candida albicans NOT DETECTED NOT DETECTED Final   Candida glabrata NOT DETECTED NOT DETECTED Final   Candida krusei NOT DETECTED NOT DETECTED Final   Candida parapsilosis NOT DETECTED NOT DETECTED Final   Candida tropicalis NOT DETECTED NOT DETECTED Final  Culture, blood (Routine x 2)     Status: Abnormal   Collection Time: 05/28/17  7:10 PM  Result Value Ref Range Status   Specimen Description BLOOD LEFT ANTECUBITAL  Final   Special Requests   Final    BOTTLES DRAWN AEROBIC AND ANAEROBIC Blood Culture adequate volume   Culture  Setup Time   Final    GRAM POSITIVE COCCI IN BOTH AEROBIC AND ANAEROBIC BOTTLES CRITICAL VALUE NOTED.  VALUE IS CONSISTENT WITH PREVIOUSLY REPORTED AND CALLED VALUE.    Culture (A)  Final    STAPHYLOCOCCUS AUREUS SUSCEPTIBILITIES PERFORMED ON PREVIOUS CULTURE WITHIN THE LAST 5 DAYS. Performed at [facility]  [facility], [place] [address], [place], [place] [address]    Report Status 05/31/2017 FINAL  Final  Surgical pcr screen     Status: Abnormal   Collection Time: 05/28/17 10:44 PM  Result Value Ref Range Status   MRSA, PCR POSITIVE (A) NEGATIVE Final    Comment: RESULT CALLED TO, READ BACK BY AND VERIFIED WITH: MAT PAGE ON 05/29/17 AT 9735 BY JAG    Staphylococcus aureus POSITIVE (A) NEGATIVE Final    Comment: RESULT CALLED TO, READ BACK BY AND VERIFIED WITH: MAT PAGE ON 05/29/17 AT 0213 BY JAG (NOTE) The Xpert SA Assay (FDA approved for NASAL specimens in patients 82 years of age and older), is one component of a comprehensive surveillance program. It is not intended to diagnose infection nor to guide or monitor treatment.   Aerobic/Anaerobic Culture (surgical/deep wound)     Status: None (Preliminary result)   Collection Time: 05/29/17  1:31 PM  Result Value Ref Range Status   Specimen Description WOUND RIGHT UPPER CHEST  Final   Special Requests NONE  Final   Gram Stain   Final    FEW WBC PRESENT, PREDOMINANTLY PMN FEW GRAM POSITIVE COCCI IN  CLUSTERS Performed at [facility], [place] [address] [place], [place] [address]    Culture   Final    FEW METHICILLIN RESISTANT STAPHYLOCOCCUS AUREUS NO ANAEROBES ISOLATED; CULTURE IN PROGRESS FOR 5 DAYS    Report Status PENDING  Incomplete   Organism ID, Bacteria METHICILLIN RESISTANT STAPHYLOCOCCUS AUREUS  Final      Susceptibility   Methicillin resistant staphylococcus aureus - MIC*    CIPROFLOXACIN >=8 RESISTANT Resistant     ERYTHROMYCIN >=8 RESISTANT Resistant     GENTAMICIN <=0.5 SENSITIVE Sensitive     OXACILLIN >=4 RESISTANT Resistant     TETRACYCLINE <=1 SENSITIVE Sensitive     VANCOMYCIN <=0.5 SENSITIVE Sensitive     TRIMETH/SULFA <=10 SENSITIVE Sensitive     CLINDAMYCIN RESISTANT Resistant     RIFAMPIN <=0.5 SENSITIVE Sensitive     Inducible Clindamycin POSITIVE Resistant     * FEW METHICILLIN RESISTANT STAPHYLOCOCCUS AUREUS  Culture, blood (single) w Reflex to ID Panel     Status: None (Preliminary result)   Collection Time: 05/29/17  6:06 PM  Result Value Ref Range Status   Specimen Description BLOOD LEFT ASSIST CONTROL  Final   Special Requests   Final    BOTTLES DRAWN AEROBIC AND ANAEROBIC Blood Culture adequate volume   Culture NO GROWTH 4 DAYS  Final   Report Status PENDING  Incomplete  CULTURE, BLOOD (ROUTINE X 2) w Reflex to ID Panel     Status: None (Preliminary result)   Collection Time: 05/30/17 11:35 AM  Result Value Ref Range Status   Specimen Description BLOOD LEFT ASSIST CONTROL  Final   Special Requests   Final    BOTTLES DRAWN AEROBIC AND ANAEROBIC Blood Culture adequate volume   Culture NO GROWTH 3 DAYS  Final   Report Status PENDING  Incomplete  CULTURE, BLOOD (ROUTINE X 2) w Reflex to ID Panel     Status: None (Preliminary result)   Collection Time: 05/30/17 11:59 AM  Result Value Ref Range Status   Specimen Description BLOOD LEFT HAND  Final   Special Requests   Final    BOTTLES DRAWN AEROBIC AND ANAEROBIC Blood Culture adequate volume    Culture NO GROWTH 3 DAYS  Final   Report Status PENDING  Incomplete  CULTURE, BLOOD (ROUTINE X 2)  w Reflex to ID Panel     Status: None (Preliminary result)   Collection Time: 05/31/17  5:01 AM  Result Value Ref Range Status   Specimen Description BLOOD LEFT ANTECUBITAL  Final   Special Requests   Final    BOTTLES DRAWN AEROBIC AND ANAEROBIC Blood Culture adequate volume   Culture NO GROWTH 2 DAYS  Final   Report Status PENDING  Incomplete  CULTURE, BLOOD (ROUTINE X 2) w Reflex to ID Panel     Status: None (Preliminary result)   Collection Time: 05/31/17  5:10 AM  Result Value Ref Range Status   Specimen Description BLOOD LEFT HAND  Final   Special Requests   Final    BOTTLES DRAWN AEROBIC AND ANAEROBIC Blood Culture adequate volume   Culture NO GROWTH 2 DAYS  Final   Report Status PENDING  Incomplete  CULTURE, BLOOD (ROUTINE X 2) w Reflex to ID Panel     Status: None (Preliminary result)   Collection Time: 06/01/17  4:50 AM  Result Value Ref Range Status   Specimen Description BLOOD LEFT AC  Final   Special Requests   Final    BOTTLES DRAWN AEROBIC AND ANAEROBIC Blood Culture adequate volume   Culture NO GROWTH 1 DAY  Final   Report Status PENDING  Incomplete  CULTURE, BLOOD (ROUTINE X 2) w Reflex to ID Panel     Status: None (Preliminary result)   Collection Time: 06/01/17  4:57 AM  Result Value Ref Range Status   Specimen Description BLOOD LEFT HAND  Final   Special Requests   Final    BOTTLES DRAWN AEROBIC AND ANAEROBIC Blood Culture adequate volume   Culture NO GROWTH 1 DAY  Final   Report Status PENDING  Incomplete  CULTURE, BLOOD (ROUTINE X 2) w Reflex to ID Panel     Status: None (Preliminary result)   Collection Time: 06/02/17  4:50 AM  Result Value Ref Range Status   Specimen Description BLOOD BLOOD LEFT HAND  Final   Special Requests   Final    BOTTLES DRAWN AEROBIC AND ANAEROBIC Blood Culture adequate volume   Culture NO GROWTH < 12 HOURS  Final   Report Status  PENDING  Incomplete  CULTURE, BLOOD (ROUTINE X 2) w Reflex to ID Panel     Status: None (Preliminary result)   Collection Time: 06/02/17  4:57 AM  Result Value Ref Range Status   Specimen Description BLOOD RIGHT ANTECUBITAL  Final   Special Requests   Final    BOTTLES DRAWN AEROBIC AND ANAEROBIC Blood Culture adequate volume   Culture NO GROWTH < 12 HOURS  Final   Report Status PENDING  Incomplete    Studies/Results: Study Conclusions ECHO 05/30/17 - Left ventricle: Systolic function was mildly reduced. The   estimated ejection fraction was in the range of 45% to 50%. - Aortic valve: There was trivial regurgitation. - Mitral valve: There was mild regurgitation. - Tricuspid valve: There was moderate regurgitation.  Assessment/Plan: [name] is a 56 y.o. male with MRSA bacteremia from infected Portacath, now s/p removal on 11/30.  He had cath removed 11/30 and fu bcx neg. TTE neg but has mod TR.  No other signs of metastatic sites of infection. Confusion improved. Some controversy over decision making on his behalf- girlfriend refused PICC line. Psych to see.  WBC up, hypothermic.   Recommendations MRSA bacteremia from infected Portacath -Cath removed 11/30 and fu bcx neg Continue vancomycin. - Can place picc line  He would benefit from a TEE to rule out endocarditis.  He is less confused today. I discussed this with him and his girlfriend and will order.  -Will need 2 weeks min of IV Vancomycin  PNA - hypothermic, wbc up, cxr with multifocal infiltrated. ? Aspiration during confusion yesterday. -  start zosyn in addition to vanco     Thank you very much for the consult. Will follow with you.  Leonel Ramsay   06/02/2017, 2:14 PM

## 2017-06-02 NOTE — Consult Note (Signed)
Pharmacy Antibiotic Note  [name] is a 56 y.o. male admitted on 05/28/2017 with pneumonia and bacteremia.  Pharmacy has been consulted for zosyn dosing. Patient already on vancomycin for MRSA bacteremia. Chest x-ray with multifocal infiltrates, possible aspiration during confusion  Plan: Zosyn 3.375g IV q8h (4 hour infusion).  Continue vanc 750mg  q 12 hr  Height: 5\' 8"  (172.7 cm) Weight: 130 lb 1.1 oz (59 kg) IBW/kg (Calculated) : 68.4  Temp (24hrs), Avg:94.9 F (34.9 C), Min:94.4 F (34.7 C), Max:95.6 F (35.3 C)  Recent Labs  Lab 05/28/17 1820 05/28/17 2018 05/28/17 2322  05/29/17 1806 05/30/17 0525 05/31/17 0501 05/31/17 1444 06/01/17 0450 06/02/17 0450  WBC 23.7*  --   --    < > 14.6* 12.1* 7.6  --  6.5 17.5*  CREATININE 0.65  --   --    < > 0.53* 0.41* 0.33*  --  0.48* 0.53*  LATICACIDVEN 3.0* 1.2 1.5  --  0.9  --   --   --   --   --   VANCOTROUGH  --   --   --   --   --   --   --  16  --   --    < > = values in this interval not displayed.    Estimated Creatinine Clearance: 85 mL/min (A) (by C-G formula based on SCr of 0.53 mg/dL (L)).    Allergies  Allergen Reactions  . Aspirin Itching and Other (See Comments)    Reaction: abdominal pain    Antimicrobials this admission: vancomycin 11/29 >>  zosyn 11/29 >> 11/30, 12/4>>  Dose adjustments this admission:   Microbiology results: 11/29 BCx: MRSA 11/30 wound: MRSA 12/1 BCx: NG x 3   Thank you for allowing pharmacy to be a part of this patient's care.  Ramond Dial, Pharm.D, BCPS Clinical Pharmacist  06/02/2017 3:12 PM

## 2017-06-02 NOTE — Progress Notes (Signed)
CH responded to an OR for an AD. Pt appeared groggy. Significant other was bedside. CH reluctantly gave an education for the AD, but does not at this time feel the Pt is a candidate for completion. RN concurs. [place] suggests additional education and evaluation at another rounding.    06/02/17 1800  Clinical Encounter Type  Visited With Patient;Patient and family together;Health care provider  Visit Type Initial;Spiritual support  Referral From Nurse  Consult/Referral To Chaplain  Spiritual Encounters  Spiritual Needs Literature

## 2017-06-02 NOTE — Consult Note (Signed)
[facility] Consultation Note  Patient ID: [name], MRN: [id number], DOB/AGE: Jul 23, 1960 56 y.o. Admit date: 05/28/2017   Date of Consult: 06/02/2017 Primary Physician: [name], MD Primary Cardiologist: None  Chief Complaint:  Chief Complaint  Patient presents with  . Altered Mental Status   Reason for Consult: endocarditis  HPI: 56 y.o. 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 The patient is improving overall feeling much more clear with his sensorium. The patient has had an echocardiogram showing normal LV systolic function and no apparent valvular endocarditis but some moderate tricuspid regurgitation concerning for other endocarditis and/or infectious concerns. Patient therefore may benefit from transesophageal echocardiogram for further intervention mention an assessment  Past Medical History:  Diagnosis Date  . Anemia   . BPH without obstruction/lower urinary tract symptoms   . Cancer ([place])   . Cellulitis of scrotum   . Cyst of prostate 05/13/2015  . Diabetes ([place])   . Difficulty urinating   . Dyspnea   . Epididymoorchitis   . ETOH abuse   . Fracture of right tibia and fibula   . GERD (gastroesophageal reflux disease)   . GI bleed   . Malignant neoplasm of sigmoid colon ([place])   . Pancreatitis   . Trichomoniasis   . Wears dentures    Partial upper and lower.  reports poor fit.      Surgical History:  Past Surgical History:  Procedure Laterality Date  . COLONOSCOPY WITH PROPOFOL N/A 12/02/2016   Procedure: COLONOSCOPY WITH PROPOFOL;  Surgeon: Lucilla Lame, MD;  Location: [facility] ENDOSCOPY;  Service: Endoscopy;  Laterality: N/A;  . ESOPHAGOGASTRODUODENOSCOPY (EGD) WITH PROPOFOL N/A 12/02/2016   Procedure:  ESOPHAGOGASTRODUODENOSCOPY (EGD) WITH PROPOFOL;  Surgeon: Lucilla Lame, MD;  Location: [facility] ENDOSCOPY;  Service: Endoscopy;  Laterality: N/A;  . FLEXIBLE SIGMOIDOSCOPY N/A 02/05/2017   Procedure: FLEXIBLE SIGMOIDOSCOPY;  Surgeon: Lucilla Lame, MD;  Location: [facility];  Service: Gastroenterology;  Laterality: N/A;  Needs labs drawn. Needs to come in early.  No anesthesia  . FRACTURE SURGERY     TIBIA AND FIBULA  . FRACTURE SURGERY    . LAPAROSCOPIC PARTIAL COLECTOMY  04/17/2017   [facility]  . PORT-A-CATH REMOVAL N/A 05/29/2017   Procedure: REMOVAL PORT-A-CATH;  Surgeon: Vickie Epley, MD;  Location: [facility] ORS;  Service: Vascular;  Laterality: N/A;  . PORTACATH PLACEMENT Right 05/18/2017   Procedure: INSERTION PORT-A-CATH;  Surgeon: Vickie Epley, MD;  Location: [facility] ORS;  Service: Vascular;  Laterality: Right;  . resection of pancreas    . SCROTAL EXPLORATION       Home Meds: Prior to Admission medications   Medication Sig Start Date End Date Taking? Authorizing Provider  amitriptyline (ELAVIL) 25 MG tablet Take 25 mg by mouth. 02/17/17  Yes [provider]  aspirin EC 81 MG tablet Take 81 mg by mouth. 07/20/15  Yes [provider]  cephALEXin (KEFLEX) 500 MG capsule Take 1 capsule (500 mg total) by mouth 2 (two) times daily for 7 days. 05/26/17 06/02/17 Yes Arta Silence, MD  cyclobenzaprine (FLEXERIL) 10 MG tablet Take 10 mg by mouth 2 (two) times daily. 02/15/16  Yes [provider]  LANTUS SOLOSTAR 100 UNIT/ML Solostar Pen Inject 10 Units into the skin 2 (two) times daily. Patient taking differently: Inject 10 Units into  the skin daily at 10 pm.  07/02/16  Yes Harvest Dark, MD  lipase/protease/amylase (CREON) 36000 UNITS CPEP capsule Take 1 capsule (36,000 Units total) 3 (three) times daily before meals by mouth. 05/05/17  Yes Earlie Server, MD  NOVOLOG FLEXPEN 100 UNIT/ML FlexPen Inject 48 Units into the skin 3 (three) times daily with meals.   05/16/15  Yes [provider]  Vitamins/Minerals TABS Take 1 tablet every morning by mouth.  05/14/15  Yes [provider]  B-D UF III MINI PEN NEEDLES 31G X 5 MM MISC  10/17/16   [provider]  Blood Glucose Monitoring Suppl (GLUCOCOM BLOOD GLUCOSE MONITOR) DEVI use as directed 08/13/15   [provider]  diphenoxylate-atropine (LOMOTIL) 2.5-0.025 MG tablet take 1 tablet four times a day if needed 01/01/17   [provider]  lidocaine-prilocaine (EMLA) cream Apply over porta cath 1-2 hours prior to chemo. 05/26/17   Earlie Server, MD  ondansetron (ZOFRAN) 8 MG tablet Take 1 tablet (8 mg total) by mouth every 8 (eight) hours as needed for nausea or vomiting. 05/26/17   Earlie Server, MD  Oxycodone HCl 10 MG TABS Take 1 tablet (10 mg total) every 6 (six) hours as needed by mouth. 05/18/17   Earlie Server, MD  prochlorperazine (COMPAZINE) 10 MG tablet Take 1 tablet (10 mg total) by mouth every 6 (six) hours as needed for nausea or vomiting. 05/26/17   Earlie Server, MD  RA ACETAMINOPHEN 325 MG tablet take 2 tablets by mouth every 6 hours 04/21/17   [provider]  traZODone (DESYREL) 50 MG tablet take 1 tablet by mouth at bedtime TO HELP WITH SLEEP-PRN 12/05/16   [provider]    Inpatient Medications:  . Chlorhexidine Gluconate Cloth  6 each Topical Q0600  . enoxaparin (LOVENOX) injection  40 mg Subcutaneous Q24H  . folic acid  1 mg Oral Daily  . insulin aspart  0-9 Units Subcutaneous Q6H  . multivitamin with minerals  1 tablet Oral Daily  . mupirocin ointment  1 application Nasal BID  . pantoprazole (PROTONIX) IV  40 mg Intravenous Q24H  . potassium chloride  40 mEq Oral BID  . thiamine  100 mg Oral Daily   Or  . thiamine  100 mg Intravenous Daily   . piperacillin-tazobactam (ZOSYN)  IV    . vancomycin Stopped (06/02/17 1421)    Allergies:  Allergies  Allergen Reactions  . Aspirin Itching and Other (See Comments)    Reaction: abdominal pain     Social History   Socioeconomic History  . Marital status: Single    Spouse name: Not on file  . Number of children: Not on file  . Years of education: Not on file  . Highest education level: Not on file  Social Needs  . Financial resource strain: Not on file  . Food insecurity - worry: Not on file  . Food insecurity - inability: Not on file  . Transportation needs - medical: Not on file  . Transportation needs - non-medical: Not on file  Occupational History  . Not on file  Tobacco Use  . Smoking status: Current Every Day Smoker    Packs/day: 0.25    Years: 41.00    Pack years: 10.25    Types: Cigarettes  . Smokeless tobacco: Never Used  . Tobacco comment: since age 68  Substance and Sexual Activity  . Alcohol use: Yes    Alcohol/week: 0.0 oz    Comment: occasionally - but says not  drinkning currently  . Drug use: No  . Sexual activity: Yes    Partners: Male    Birth control/protection: None  Other Topics Concern  . Not on file  Social History Narrative  . Not on file     Family History  Problem Relation Age of Onset  . Cancer - Colon Father   . Colon cancer Brother      Review of Systems Positive for shortness of breath Negative for: General:  chills, fever, night sweats or weight changes.  Cardiovascular: PND orthopnea syncope dizziness  Dermatological skin lesions rashes Respiratory: Cough congestion Urologic: Frequent urination urination at night and hematuria Abdominal: negative for nausea, vomiting, diarrhea, bright red blood per rectum, melena, or hematemesis Neurologic: negative for visual changes, and/or hearing changes  All other systems reviewed and are otherwise negative except as noted above.  Labs: No results for input(s): CKTOTAL, CKMB, TROPONINI in the last 72 hours. Lab Results  Component Value Date   WBC 17.5 (H) 06/02/2017   HGB 9.9 (L) 06/02/2017   HCT 30.0 (L) 06/02/2017   MCV 92.3 06/02/2017   PLT 153 06/02/2017    Recent  Labs  Lab 05/29/17 1806  06/02/17 0450  NA 140   < > 138  K 2.9*   < > 3.4*  CL 110   < > 113*  CO2 25   < > 18*  BUN 6   < > <5*  CREATININE 0.53*   < > 0.53*  CALCIUM 7.6*   < > 8.0*  PROT 5.7*  --   --   BILITOT 0.5  --   --   ALKPHOS 154*  --   --   ALT 20  --   --   AST 31  --   --   GLUCOSE 123*   < > 96   < > = values in this interval not displayed.   Lab Results  Component Value Date   CHOL 197 09/11/2012   HDL 24 (L) 09/11/2012   LDLCALC 149 (H) 09/11/2012   TRIG 119 09/11/2012   No results found for: DDIMER  Radiology/Studies:  Dg Chest 1 View  Result Date: 06/02/2017 CLINICAL DATA:  Hypothermia EXAM: CHEST 1 VIEW COMPARISON:  May 28, 2017 FINDINGS: There is a small left pleural effusion. There is patchy airspace consolidation in both mid and lower lung zones. Consolidation is most notable in the medial left base. Heart size and pulmonary vascularity are normal. No adenopathy. There is evidence of old rib trauma on the left. IMPRESSION: Patchy airspace opacity bilaterally, most notably in the lower lung zones, slightly more severe on the left than on the right. The appearance felt to represent multifocal pneumonia. Small left pleural effusion. Heart size within normal limits. No evident adenopathy. Electronically Signed   By: Lowella Grip III M.D.   On: 06/02/2017 10:47   Ct Head Wo Contrast  Result Date: 05/28/2017 CLINICAL DATA:  Initial evaluation for acute altered mental status, increased confusion. History of prostate cancer. EXAM: CT HEAD WITHOUT CONTRAST TECHNIQUE: Contiguous axial images were obtained from the base of the skull through the vertex without intravenous contrast. COMPARISON:  Prior CT from 11/30/2016. FINDINGS: Brain: Stable atrophy with chronic small vessel ischemic disease. No acute intracranial hemorrhage. No acute large vessel territory infarct. No mass lesion, midline shift or mass effect. No hydrocephalus. No extra-axial fluid  collection. Vascular: No hyperdense vessel. Scattered vascular calcifications noted within the carotid siphons. Skull: Scalp soft tissues and calvarium  within normal limits. Sinuses/Orbits: Globes and orbital soft tissues normal. Paranasal sinuses and mastoid air cells are clear. Other: None. IMPRESSION: 1. No acute intracranial abnormality. 2. Stable atrophy with chronic small vessel ischemic disease. Electronically Signed   By: Jeannine Boga M.D.   On: 05/28/2017 18:21   Dg Chest Portable 1 View  Result Date: 05/28/2017 CLINICAL DATA:  Altered mental status. Prostate cancer and pancreatitis. EXAM: PORTABLE CHEST 1 VIEW COMPARISON:  05/25/2017 FINDINGS: Right Port-A-Cath terminates at the mid SVC. Midline trachea. Normal heart size and mediastinal contours. No pleural effusion or pneumothorax. Remote bilateral rib fractures. IMPRESSION: No acute cardiopulmonary disease. Electronically Signed   By: Abigail Miyamoto M.D.   On: 05/28/2017 17:39   Dg Chest Portable 1 View  Result Date: 05/25/2017 CLINICAL DATA:  Pt comes into the ED via POV c/o hyperglycemia. Patient's cancer Dr. called him and informed him that his sugar was over 600. Patient is diabetic and has not started his colon cancer treatments at this time. EXAM: PORTABLE CHEST 1 VIEW COMPARISON:  05/18/2017 FINDINGS: Patient is a right-sided Port-A-Cath, tip overlying the level of the superior vena cava. Heart size is normal. Lungs are free of focal consolidations and pleural effusions. No pulmonary edema. IMPRESSION: No evidence for acute cardiopulmonary abnormality. Electronically Signed   By: Nolon Nations M.D.   On: 05/25/2017 13:34   Dg Chest Port 1 View  Result Date: 05/18/2017 CLINICAL DATA:  Central line placement EXAM: PORTABLE CHEST 1 VIEW COMPARISON:  12/27/2016 FINDINGS: Right Port-A-Cath placement with the tip in the SVC. No pneumothorax. Heart is normal size. Lungs are clear. Multiple old healed left rib fractures. No  effusions. IMPRESSION: Right Port-A-Cath placement with the tip in the SVC. No pneumothorax. No active disease. Electronically Signed   By: Rolm Baptise M.D.   On: 05/18/2017 09:58   Dg C-arm 1-60 Min-no Report  Result Date: 05/18/2017 Fluoroscopy was utilized by the requesting physician.  No radiographic interpretation.    EKG: Normal sinus rhythm  Weights: Filed Weights   05/28/17 1720 05/29/17 1630 05/30/17 1000  Weight: 47.6 kg (105 lb) 56 kg (123 lb 7.3 oz) 59 kg (130 lb 1.1 oz)     Physical Exam: Blood pressure 94/62, pulse 62, temperature (!) 95.6 F (35.3 C), temperature source Axillary, resp. rate 16, height 5\' 8"  (1.727 m), weight 59 kg (130 lb 1.1 oz), SpO2 97 %. Body mass index is 19.78 kg/m. General: Well developed, well nourished, in no acute distress. Head eyes ears nose throat: Normocephalic, atraumatic, sclera non-icteric, no xanthomas, nares are without discharge. No apparent thyromegaly and/or mass  Lungs: Normal respiratory effort.  no wheezes, no rales, no rhonchi.  Heart: RRR with normal S1 S2. no murmur gallop, no rub, PMI is normal size and placement, carotid upstroke normal without bruit, jugular venous pressure is normal Abdomen: Soft, non-tender, non-distended with normoactive bowel sounds. No hepatomegaly. No rebound/guarding. No obvious abdominal masses. Abdominal aorta is normal size without bruit Extremities: No edema. no cyanosis, no clubbing, no ulcers  Peripheral : 2+ bilateral upper extremity pulses, 2+ bilateral femoral pulses, 2+ bilateral dorsal pedal pulse Neuro: Alert and oriented. No facial asymmetry. No focal deficit. Moves all extremities spontaneously. Musculoskeletal: Normal muscle tone without kyphosis Psych:  Responds to questions appropriately with a normal affect.    Assessment: 56 year old male with apparent Port-A-Cath infection now improving with negative blood cultures status post Port-A-Cath removal concerning now for further  risk management and treatment regimen depending on possible transesophageal  echo echocardiogram and/or concerns for endocarditis  Plan: 1. Continue treatment for Port-A-Cath and bacteremia 2. Proceed to cardiac transesophageal echocardiogram for further evaluation and treatment of possible endocarditis and recent bacteremia. Patient understands risk and benefits of transesophageal echocardiogram. This includes possibility of infection bleeding medication side effects esophageal perforation and esophageal irritation. The patient is at low risk for conscious sedation  Signed, Corey Skains M.D. [facility] 06/02/2017, 5:14 PM

## 2017-06-02 NOTE — Progress Notes (Addendum)
Patient ID: [name], male   DOB: 1960/09/25, 56 y.o.   MRN: [id number]  [facility] PROGRESS NOTE  ASHWIN TIBBS [id number]:[id number] DOB: 11-Dec-1960 DOA: 05/28/2017 PCP: [name], MD  HPI/Subjective: Patient seen this morning - no confused,  As per girl friend, much improved   .yesterday wanted to do PICC line, but he refused. Today- his WBCs is high and have some soft and large amount stools. Also is hypothermic.   Objective: Vitals:   06/02/17 0810 06/02/17 0900  BP:    Pulse:    Resp:    Temp: (!) 94.7 F (34.8 C) (!) 95.6 F (35.3 C)  SpO2:      Intake/Output Summary (Last 24 hours) at 06/02/2017 0931 Last data filed at 06/02/2017 0900 Gross per 24 hour  Intake 1273 ml  Output 1600 ml  Net -327 ml   Filed Weights   05/28/17 1720 05/29/17 1630 05/30/17 1000  Weight: 47.6 kg (105 lb) 56 kg (123 lb 7.3 oz) 59 kg (130 lb 1.1 oz)    ROS: Review of Systems  Constitutional: Positive for malaise/fatigue. Negative for chills, fever and weight loss.  HENT: Negative for congestion, nosebleeds and tinnitus.   Eyes: Negative for blurred vision and photophobia.  Respiratory: Negative for cough, hemoptysis and shortness of breath.   Cardiovascular: Negative for chest pain, palpitations and leg swelling.  Gastrointestinal: Negative for abdominal pain, blood in stool, heartburn, nausea and vomiting.  Genitourinary: Negative for frequency, hematuria and urgency.  Musculoskeletal: Negative for myalgias.  Skin: Negative for rash.  Neurological: Negative for dizziness, tremors, sensory change, focal weakness and seizures.  Psychiatric/Behavioral: Negative for depression.   Exam: Physical Exam  Constitutional: He is oriented to person, place, and time.  HENT:  Nose: No mucosal edema.  Mouth/Throat: No oropharyngeal exudate or posterior oropharyngeal edema.  Eyes: Conjunctivae and lids are normal. Pupils are equal, round, and reactive to light.  Neck: No JVD present.  Carotid bruit is not present. No edema present. No thyroid mass and no thyromegaly present.  Cardiovascular: S1 normal and S2 normal. Exam reveals no gallop.  No murmur heard. Pulses:      Dorsalis pedis pulses are 2+ on the right side, and 2+ on the left side.  Respiratory: No respiratory distress. He has no wheezes. He has no rhonchi. He has no rales.  GI: Soft. Bowel sounds are normal. There is no tenderness.  Musculoskeletal:       Right ankle: He exhibits no swelling.       Left ankle: He exhibits no swelling.  Lymphadenopathy:    He has no cervical adenopathy.  Neurological: He is alert and oriented to person, place, and time.  Skin: Skin is warm. Nails show no clubbing.  Dressing on right upper chest.  Psychiatric:  He is awake, and oriented today, confusion seems cleared up.      Data Reviewed: Basic Metabolic Panel: Recent Labs  Lab 05/29/17 1806 05/30/17 0525 05/31/17 0501 05/31/17 1444 06/01/17 0450 06/02/17 0450  NA 140 137 138  --  138 138  K 2.9* 4.0 2.8* 3.3* 3.1* 3.4*  CL 110 112* 115*  --  117* 113*  CO2 25 20* 19*  --  18* 18*  GLUCOSE 123* 137* 96  --  125* 96  BUN 6 7 6   --  <5* <5*  CREATININE 0.53* 0.41* 0.33*  --  0.48* 0.53*  CALCIUM 7.6* 7.5* 7.5*  --  7.6* 8.0*  MG 1.7 2.0 1.9  --  1.8 1.7  PHOS 3.2 2.8 2.8  --  3.2 3.0   Liver Function Tests: Recent Labs  Lab 05/28/17 1820 05/29/17 0352 05/29/17 1806  AST 23 23 31   ALT 20 16* 20  ALKPHOS 142* 125 154*  BILITOT 0.3 0.5 0.5  PROT 6.3* 5.2* 5.7*  ALBUMIN 2.7* 2.1* 2.2*   CBC: Recent Labs  Lab 05/28/17 1820  05/29/17 1806 05/30/17 0525 05/31/17 0501 06/01/17 0450 06/02/17 0450  WBC 23.7*   < > 14.6* 12.1* 7.6 6.5 17.5*  NEUTROABS 20.6*  --   --   --   --   --   --   HGB 9.4*   < > 9.1* 8.8* 8.6* 8.3* 9.9*  HCT 28.5*   < > 27.0* 27.0* 26.2* 25.4* 30.0*  MCV 93.2   < > 93.5 94.2 93.6 93.1 92.3  PLT 172   < > 166 163 145* 121* 153   < > = values in this interval not displayed.    Cardiac Enzymes: Recent Labs  Lab 05/28/17 1820  TROPONINI 0.07*    CBG: Recent Labs  Lab 06/01/17 0605 06/01/17 1132 06/01/17 1740 06/02/17 0018 06/02/17 0544  GLUCAP 112* 189* 125* 120* 94    Recent Results (from the past 240 hour(s))  Culture, blood (Routine x 2)     Status: Abnormal   Collection Time: 05/28/17  6:21 PM  Result Value Ref Range Status   Specimen Description BLOOD BLOOD LEFT ARM  Final   Special Requests   Final    BOTTLES DRAWN AEROBIC AND ANAEROBIC Blood Culture adequate volume   Culture  Setup Time   Final    GRAM POSITIVE COCCI IN BOTH AEROBIC AND ANAEROBIC BOTTLES CRITICAL RESULT CALLED TO, READ BACK BY AND VERIFIED WITH: KRISTIN MERRILL AT 9147 05/29/17 SDR    Culture METHICILLIN RESISTANT STAPHYLOCOCCUS AUREUS (A)  Final   Report Status 05/31/2017 FINAL  Final   Organism ID, Bacteria METHICILLIN RESISTANT STAPHYLOCOCCUS AUREUS  Final      Susceptibility   Methicillin resistant staphylococcus aureus - MIC*    CIPROFLOXACIN >=8 RESISTANT Resistant     ERYTHROMYCIN >=8 RESISTANT Resistant     GENTAMICIN <=0.5 SENSITIVE Sensitive     OXACILLIN >=4 RESISTANT Resistant     TETRACYCLINE <=1 SENSITIVE Sensitive     VANCOMYCIN <=0.5 SENSITIVE Sensitive     TRIMETH/SULFA <=10 SENSITIVE Sensitive     CLINDAMYCIN RESISTANT Resistant     RIFAMPIN <=0.5 SENSITIVE Sensitive     Inducible Clindamycin POSITIVE Resistant     * METHICILLIN RESISTANT STAPHYLOCOCCUS AUREUS  Blood Culture ID Panel (Reflexed)     Status: Abnormal   Collection Time: 05/28/17  6:21 PM  Result Value Ref Range Status   Enterococcus species NOT DETECTED NOT DETECTED Final   Listeria monocytogenes NOT DETECTED NOT DETECTED Final   Staphylococcus species DETECTED (A) NOT DETECTED Final    Comment: CRITICAL RESULT CALLED TO, READ BACK BY AND VERIFIED WITH:  KRISTIN MERRILL AT 8295 05/29/17 SDR    Staphylococcus aureus DETECTED (A) NOT DETECTED Final    Comment: Methicillin  (oxacillin)-resistant Staphylococcus aureus (MRSA). MRSA is predictably resistant to beta-lactam antibiotics (except ceftaroline). Preferred therapy is vancomycin unless clinically contraindicated. Patient requires contact precautions if  hospitalized. CRITICAL RESULT CALLED TO, READ BACK BY AND VERIFIED WITH:  KRISTIN MERRILL AT 6213 05/29/17 SDR    Methicillin resistance DETECTED (A) NOT DETECTED Final    Comment: CRITICAL RESULT CALLED TO, READ BACK BY AND VERIFIED WITH:  KRISTIN MERRILL AT 0867 05/29/17 SDR    Streptococcus species NOT DETECTED NOT DETECTED Final   Streptococcus agalactiae NOT DETECTED NOT DETECTED Final   Streptococcus pneumoniae NOT DETECTED NOT DETECTED Final   Streptococcus pyogenes NOT DETECTED NOT DETECTED Final   Acinetobacter baumannii NOT DETECTED NOT DETECTED Final   Enterobacteriaceae species NOT DETECTED NOT DETECTED Final   Enterobacter cloacae complex NOT DETECTED NOT DETECTED Final   Escherichia coli NOT DETECTED NOT DETECTED Final   Klebsiella oxytoca NOT DETECTED NOT DETECTED Final   Klebsiella pneumoniae NOT DETECTED NOT DETECTED Final   Proteus species NOT DETECTED NOT DETECTED Final   Serratia marcescens NOT DETECTED NOT DETECTED Final   Haemophilus influenzae NOT DETECTED NOT DETECTED Final   Neisseria meningitidis NOT DETECTED NOT DETECTED Final   Pseudomonas aeruginosa NOT DETECTED NOT DETECTED Final   Candida albicans NOT DETECTED NOT DETECTED Final   Candida glabrata NOT DETECTED NOT DETECTED Final   Candida krusei NOT DETECTED NOT DETECTED Final   Candida parapsilosis NOT DETECTED NOT DETECTED Final   Candida tropicalis NOT DETECTED NOT DETECTED Final  Culture, blood (Routine x 2)     Status: Abnormal   Collection Time: 05/28/17  7:10 PM  Result Value Ref Range Status   Specimen Description BLOOD LEFT ANTECUBITAL  Final   Special Requests   Final    BOTTLES DRAWN AEROBIC AND ANAEROBIC Blood Culture adequate volume   Culture  Setup Time    Final    GRAM POSITIVE COCCI IN BOTH AEROBIC AND ANAEROBIC BOTTLES CRITICAL VALUE NOTED.  VALUE IS CONSISTENT WITH PREVIOUSLY REPORTED AND CALLED VALUE.    Culture (A)  Final    STAPHYLOCOCCUS AUREUS SUSCEPTIBILITIES PERFORMED ON PREVIOUS CULTURE WITHIN THE LAST 5 DAYS. Performed at [facility], [place] [address], [place], [place] [address]    Report Status 05/31/2017 FINAL  Final  Surgical pcr screen     Status: Abnormal   Collection Time: 05/28/17 10:44 PM  Result Value Ref Range Status   MRSA, PCR POSITIVE (A) NEGATIVE Final    Comment: RESULT CALLED TO, READ BACK BY AND VERIFIED WITH: MAT PAGE ON 05/29/17 AT 9326 BY JAG    Staphylococcus aureus POSITIVE (A) NEGATIVE Final    Comment: RESULT CALLED TO, READ BACK BY AND VERIFIED WITH: MAT PAGE ON 05/29/17 AT 0213 BY JAG (NOTE) The Xpert SA Assay (FDA approved for NASAL specimens in patients 81 years of age and older), is one component of a comprehensive surveillance program. It is not intended to diagnose infection nor to guide or monitor treatment.   Aerobic/Anaerobic Culture (surgical/deep wound)     Status: None (Preliminary result)   Collection Time: 05/29/17  1:31 PM  Result Value Ref Range Status   Specimen Description WOUND RIGHT UPPER CHEST  Final   Special Requests NONE  Final   Gram Stain   Final    FEW WBC PRESENT, PREDOMINANTLY PMN FEW GRAM POSITIVE COCCI IN CLUSTERS Performed at [facility], [place] [address], [place], [place] [address]    Culture   Final    FEW METHICILLIN RESISTANT STAPHYLOCOCCUS AUREUS NO ANAEROBES ISOLATED; CULTURE IN PROGRESS FOR 5 DAYS    Report Status PENDING  Incomplete   Organism ID, Bacteria METHICILLIN RESISTANT STAPHYLOCOCCUS AUREUS  Final      Susceptibility   Methicillin resistant staphylococcus aureus - MIC*    CIPROFLOXACIN >=8 RESISTANT Resistant     ERYTHROMYCIN >=8 RESISTANT Resistant     GENTAMICIN <=0.5 SENSITIVE  Sensitive     OXACILLIN >=4 RESISTANT  Resistant     TETRACYCLINE <=1 SENSITIVE Sensitive     VANCOMYCIN <=0.5 SENSITIVE Sensitive     TRIMETH/SULFA <=10 SENSITIVE Sensitive     CLINDAMYCIN RESISTANT Resistant     RIFAMPIN <=0.5 SENSITIVE Sensitive     Inducible Clindamycin POSITIVE Resistant     * FEW METHICILLIN RESISTANT STAPHYLOCOCCUS AUREUS  Culture, blood (single) w Reflex to ID Panel     Status: None (Preliminary result)   Collection Time: 05/29/17  6:06 PM  Result Value Ref Range Status   Specimen Description BLOOD LEFT ASSIST CONTROL  Final   Special Requests   Final    BOTTLES DRAWN AEROBIC AND ANAEROBIC Blood Culture adequate volume   Culture NO GROWTH 4 DAYS  Final   Report Status PENDING  Incomplete  CULTURE, BLOOD (ROUTINE X 2) w Reflex to ID Panel     Status: None (Preliminary result)   Collection Time: 05/30/17 11:35 AM  Result Value Ref Range Status   Specimen Description BLOOD LEFT ASSIST CONTROL  Final   Special Requests   Final    BOTTLES DRAWN AEROBIC AND ANAEROBIC Blood Culture adequate volume   Culture NO GROWTH 3 DAYS  Final   Report Status PENDING  Incomplete  CULTURE, BLOOD (ROUTINE X 2) w Reflex to ID Panel     Status: None (Preliminary result)   Collection Time: 05/30/17 11:59 AM  Result Value Ref Range Status   Specimen Description BLOOD LEFT HAND  Final   Special Requests   Final    BOTTLES DRAWN AEROBIC AND ANAEROBIC Blood Culture adequate volume   Culture NO GROWTH 3 DAYS  Final   Report Status PENDING  Incomplete  CULTURE, BLOOD (ROUTINE X 2) w Reflex to ID Panel     Status: None (Preliminary result)   Collection Time: 05/31/17  5:01 AM  Result Value Ref Range Status   Specimen Description BLOOD LEFT ANTECUBITAL  Final   Special Requests   Final    BOTTLES DRAWN AEROBIC AND ANAEROBIC Blood Culture adequate volume   Culture NO GROWTH 2 DAYS  Final   Report Status PENDING  Incomplete  CULTURE, BLOOD (ROUTINE X 2) w Reflex to ID Panel     Status: None (Preliminary result)   Collection  Time: 05/31/17  5:10 AM  Result Value Ref Range Status   Specimen Description BLOOD LEFT HAND  Final   Special Requests   Final    BOTTLES DRAWN AEROBIC AND ANAEROBIC Blood Culture adequate volume   Culture NO GROWTH 2 DAYS  Final   Report Status PENDING  Incomplete  CULTURE, BLOOD (ROUTINE X 2) w Reflex to ID Panel     Status: None (Preliminary result)   Collection Time: 06/01/17  4:50 AM  Result Value Ref Range Status   Specimen Description BLOOD LEFT AC  Final   Special Requests   Final    BOTTLES DRAWN AEROBIC AND ANAEROBIC Blood Culture adequate volume   Culture NO GROWTH 1 DAY  Final   Report Status PENDING  Incomplete  CULTURE, BLOOD (ROUTINE X 2) w Reflex to ID Panel     Status: None (Preliminary result)   Collection Time: 06/01/17  4:57 AM  Result Value Ref Range Status   Specimen Description BLOOD LEFT HAND  Final   Special Requests   Final    BOTTLES DRAWN AEROBIC AND ANAEROBIC Blood Culture adequate volume   Culture NO GROWTH 1 DAY  Final  Report Status PENDING  Incomplete  CULTURE, BLOOD (ROUTINE X 2) w Reflex to ID Panel     Status: None (Preliminary result)   Collection Time: 06/02/17  4:50 AM  Result Value Ref Range Status   Specimen Description BLOOD BLOOD LEFT HAND  Final   Special Requests   Final    BOTTLES DRAWN AEROBIC AND ANAEROBIC Blood Culture adequate volume   Culture NO GROWTH < 12 HOURS  Final   Report Status PENDING  Incomplete  CULTURE, BLOOD (ROUTINE X 2) w Reflex to ID Panel     Status: None (Preliminary result)   Collection Time: 06/02/17  4:57 AM  Result Value Ref Range Status   Specimen Description BLOOD RIGHT ANTECUBITAL  Final   Special Requests   Final    BOTTLES DRAWN AEROBIC AND ANAEROBIC Blood Culture adequate volume   Culture NO GROWTH < 12 HOURS  Final   Report Status PENDING  Incomplete     Studies: No results found.  Scheduled Meds: . Chlorhexidine Gluconate Cloth  6 each Topical Q0600  . enoxaparin (LOVENOX) injection  40 mg  Subcutaneous Q24H  . folic acid  1 mg Oral Daily  . insulin aspart  0-9 Units Subcutaneous Q6H  . multivitamin with minerals  1 tablet Oral Daily  . mupirocin ointment  1 application Nasal BID  . pantoprazole (PROTONIX) IV  40 mg Intravenous Q24H  . potassium chloride  40 mEq Oral BID  . thiamine  100 mg Oral Daily   Or  . thiamine  100 mg Intravenous Daily   Continuous Infusions: . sodium chloride    . vancomycin Stopped (06/02/17 0319)    Assessment/Plan:  1. Sepsis with staph aureus bacteremia       Have hypothermia, borderline hypotension and elevated WBCs on 06/02/17       and hypotension postoperatively after removing port 05/29/17.  Leukocytosis, tachycardia and lactic acidosis.  Continue IV vancomycin.  Improved with fluids in stepdown, moved to floor now. Stable.      With hypothermia now- started on bear hugger, IV fluid bolus, check UA, xray chest.       Repeat Bl cx are negative for last 3 days. Check TSH.     ID consult appreciated. 2. Hypokalemia replace potassium IV and orally.  IV magnesium also given. 3. Anemia.  Due to chronic disease and colon cancer, monitor. 4. History of alcohol abuse on CIWA protocol- may be this is the reason for his confusion. 5. Type 2 diabetes on sliding scale. 6. Malignant neoplasm of sigmoid colon 7. Moderate protein calorie malnutrition 8. History of pancreatic insufficiency 9. Altered mental status- may be due to sepsis or due to alcohol withdrawal. Monitor.- more alert now. 10.    Cont thiamin and folic acid.  Code Status:     Code Status Orders  (From admission, onward)        Start     Ordered   05/28/17 2204  Full code  Continuous     05/28/17 2203    Code Status History    Date Active Date Inactive Code Status Order ID Comments User Context   12/01/2016 02:45 12/04/2016 16:34 Full Code [id number]  Lance Coon, MD Inpatient   05/12/2015 22:39 05/14/2015 17:23 Full Code [id number]  Lance Coon, MD Inpatient     Family  Communication: Will call social worker consult today, due to family dinemics- Girl friend denying mother's authority on decision making. Disposition Plan: may be in 2-3 days.  Consultants:  General surgery  Critical care specialist  Procedures:  Port-A-Cath removal  Antibiotics:  IV vancomycin  Time spent: 40 critical care minutes  [employer]

## 2017-06-02 NOTE — Consult Note (Signed)
[facility] Face-to-Face Psychiatry Consult   Reason for Consult: Consult for 56 year old man with a history of multiple medical problems.  Concern apparently about his capacity not only to make medical decisions but his capacity to assign that duty to others. 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 Sepsis ([place]) [A41.9] 05/28/2017  . Local infection due to central venous catheter [T80.212A]   . Tobacco use disorder [F17.200] 04/21/2017  . Malignant neoplasm of rectum ([place]) [C20] 04/16/2017  . Moderate malnutrition ([place]) [E44.0] 02/12/2017  . Pancreatic insufficiency [K86.89] 02/12/2017  . Chronic abdominal pain [R10.9, G89.29] 01/29/2017  . Malignant neoplasm of sigmoid colon ([place]) [C18.7] 01/15/2017  . Shock ([place]) [R57.9] 12/02/2016  . Acute posthemorrhagic anemia [D62]   . Benign neoplasm of ascending colon [D12.2]   . Colorectal polyps [K63.5]   . Noninfectious diarrhea [K52.9]   . Syncope [R55] 12/01/2016  . Chronic diarrhea [K52.9] 12/01/2016  . Pancreatitis [K85.90] 04/17/2016  . Noncompliance [Z91.19] 07/05/2015  . Tobacco dependence [F17.200] 07/05/2015  . Fracture of right tibia and fibula [S82.201A, S82.401A] 06/05/2015  . Type 2 diabetes mellitus with hyperglycemia ([place]) [E11.65] 06/05/2015  . Prostatic cyst [N42.83] 05/13/2015  . GI bleed [K92.2] 05/12/2015  . Type 2 diabetes mellitus ([place]) [E11.9] 05/12/2015  . GERD (gastroesophageal reflux disease) [K21.9] 05/12/2015  . ETOH abuse [F10.10] 05/12/2015  . BPH (benign prostatic hyperplasia) [N40.0] 05/12/2015    Total Time spent with patient: 1 hour  Subjective:   [name] is a 56 y.o. male patient admitted with "I do not know".  HPI: Patient interviewed chart reviewed.   57 year old man who is in the hospital with pancreatitis and sepsis related to a line infection.  Patient had declined to have a PICC line placed for the last 2 days.  Concern however was also expressed because it was unclear whether the patient's mother or his long-term girlfriend was the appropriate person to make medical decisions for him.  On interview today I found the patient awake alert and cooperative.  Affect fairly flat.  Speech was slow and limited in amount.  Patient knows that he is in a hospital in [place].  He does not know the correct year.  He was not able to initially tell me why he was in the hospital.  Once he did tell me that he had pancreatitis he tended to perseverate on that several times.  He could not tell me any of his other medical problems including diabetes.  Patient is not reporting depression.  Denies hallucinations.  Denies any suicidal or homicidal ideation.  I ask him about the PICC line and whether he would allow them to place it.  He said he did not want it because he was sore all over.  When I asked him whether he would be agreeable to it if his pain were treated he seemed confused by the question.  I am not sure that despite several attempts he really understood what the nature of the procedure was.  I asked him about his preference of person to make medical decisions.  I explained to him that currently that duty would fall to his parents and that if he wanted to switch it to someone such as his girlfriend he would need to fill out the appropriate paperwork.  Patient  told me several times however that he wanted his mother to make the decisions for him.  Social history: Patient stays he reports with his mother and father.  Disabled.  Reportedly has a long-term girlfriend who has been staying with him.  Patient is not able to give me much other information.  Medical history: Multiple medical problems including diabetes history of rectal cancer with recent surgery.  Current  sepsis with infected line.  Past history of alcohol abuse.  Substance abuse history: History of alcohol abuse.  Patient insists that he does not drink anymore.  Could not tell me how long it had been since he was last drinking.  Past Psychiatric History: No known past psychiatric history.  No history of suicide attempts no history of violence no history of inpatient treatment.  Risk to Self: Is patient at risk for suicide?: No Risk to Others:   Prior Inpatient Therapy:   Prior Outpatient Therapy:    Past Medical History:  Past Medical History:  Diagnosis Date  . Anemia   . BPH without obstruction/lower urinary tract symptoms   . Cancer ([place])   . Cellulitis of scrotum   . Cyst of prostate 05/13/2015  . Diabetes ([place])   . Difficulty urinating   . Dyspnea   . Epididymoorchitis   . ETOH abuse   . Fracture of right tibia and fibula   . GERD (gastroesophageal reflux disease)   . GI bleed   . Malignant neoplasm of sigmoid colon ([place])   . Pancreatitis   . Trichomoniasis   . Wears dentures    Partial upper and lower.  reports poor fit.    Past Surgical History:  Procedure Laterality Date  . COLONOSCOPY WITH PROPOFOL N/A 12/02/2016   Procedure: COLONOSCOPY WITH PROPOFOL;  Surgeon: Lucilla Lame, MD;  Location[facility] ENDOSCOPY;  Service: Endoscopy;  Laterality: N/A;  . ESOPHAGOGASTRODUODENOSCOPY (EGD) WITH PROPOFOL N/A 12/02/2016   Procedure: ESOPHAGOGASTRODUODENOSCOPY (EGD) WITH PROPOFOL;  Surgeon: Lucilla Lame, MD;  Location: [facility] ENDOSCOPY;  Service: Endoscopy;  Laterality: N/A;  . FLEXIBLE SIGMOIDOSCOPY N/A 02/05/2017   Procedure: FLEXIBLE SIGMOIDOSCOPY;  Surgeon: Lucilla Lame, MD;  Location: [place];  Service: Gastroenterology;  Laterality: N/A;  Needs labs drawn. Needs to come in early.  No anesthesia  . FRACTURE SURGERY     TIBIA AND FIBULA  . FRACTURE SURGERY    . LAPAROSCOPIC PARTIAL COLECTOMY  04/17/2017   [facility]  . PORT-A-CATH REMOVAL N/A 05/29/2017   Procedure:  REMOVAL PORT-A-CATH;  Surgeon: Vickie Epley, MD;  Location: [facility] ORS;  Service: Vascular;  Laterality: N/A;  . PORTACATH PLACEMENT Right 05/18/2017   Procedure: INSERTION PORT-A-CATH;  Surgeon: Vickie Epley, MD;  Location: [facility] ORS;  Service: Vascular;  Laterality: Right;  . resection of pancreas    . SCROTAL EXPLORATION     Family History:  Family History  Problem Relation Age of Onset  . Cancer - Colon Father   . Colon cancer Brother    Family Psychiatric  History: No known family history Social History:  Social History   Substance and Sexual Activity  Alcohol Use Yes  . Alcohol/week: 0.0 oz   Comment: occasionally - but says not drinkning currently     Social History   Substance and Sexual Activity  Drug Use No    Social History   Socioeconomic History  . Marital status: Single    Spouse name: None  . Number of children: None  . Years of education: None  . Highest education  level: None  Social Needs  . Financial resource strain: None  . Food insecurity - worry: None  . Food insecurity - inability: None  . Transportation needs - medical: None  . Transportation needs - non-medical: None  Occupational History  . None  Tobacco Use  . Smoking status: Current Every Day Smoker    Packs/day: 0.25    Years: 41.00    Pack years: 10.25    Types: Cigarettes  . Smokeless tobacco: Never Used  . Tobacco comment: since age 6  Substance and Sexual Activity  . Alcohol use: Yes    Alcohol/week: 0.0 oz    Comment: occasionally - but says not drinkning currently  . Drug use: No  . Sexual activity: Yes    Partners: Male    Birth control/protection: None  Other Topics Concern  . None  Social History Narrative  . None   Additional Social History:    Allergies:   Allergies  Allergen Reactions  . Aspirin Itching and Other (See Comments)    Reaction: abdominal pain    Labs:  Results for orders placed or performed during the hospital encounter of 05/28/17  (from the past 48 hour(s))  Glucose, capillary     Status: Abnormal   Collection Time: 06/01/17 12:18 AM  Result Value Ref Range   Glucose-Capillary 135 (H) 65 - 99 mg/dL   Comment 1 Notify RN   Basic metabolic panel     Status: Abnormal   Collection Time: 06/01/17  4:50 AM  Result Value Ref Range   Sodium 138 135 - 145 mmol/L   Potassium 3.1 (L) 3.5 - 5.1 mmol/L   Chloride 117 (H) 101 - 111 mmol/L   CO2 18 (L) 22 - 32 mmol/L   Glucose, Bld 125 (H) 65 - 99 mg/dL   BUN <5 (L) 6 - 20 mg/dL   Creatinine, Ser 0.48 (L) 0.61 - 1.24 mg/dL   Calcium 7.6 (L) 8.9 - 10.3 mg/dL   GFR calc non Af Amer >60 >60 mL/min   GFR calc Af Amer >60 >60 mL/min    Comment: (NOTE) The eGFR has been calculated using the CKD EPI equation. This calculation has not been validated in all clinical situations. eGFR's persistently <60 mL/min signify possible Chronic Kidney Disease.    Anion gap 3 (L) 5 - 15  CBC     Status: Abnormal   Collection Time: 06/01/17  4:50 AM  Result Value Ref Range   WBC 6.5 3.8 - 10.6 K/uL   RBC 2.73 (L) 4.40 - 5.90 MIL/uL   Hemoglobin 8.3 (L) 13.0 - 18.0 g/dL   HCT 25.4 (L) 40.0 - 52.0 %   MCV 93.1 80.0 - 100.0 fL   MCH 30.6 26.0 - 34.0 pg   MCHC 32.9 32.0 - 36.0 g/dL   RDW 15.5 (H) 11.5 - 14.5 %   Platelets 121 (L) 150 - 440 K/uL  Magnesium     Status: None   Collection Time: 06/01/17  4:50 AM  Result Value Ref Range   Magnesium 1.8 1.7 - 2.4 mg/dL  Phosphorus     Status: None   Collection Time: 06/01/17  4:50 AM  Result Value Ref Range   Phosphorus 3.2 2.5 - 4.6 mg/dL  CULTURE, BLOOD (ROUTINE X 2) w Reflex to ID Panel     Status: None (Preliminary result)   Collection Time: 06/01/17  4:50 AM  Result Value Ref Range   Specimen Description BLOOD LEFT [facility]    Special Requests  BOTTLES DRAWN AEROBIC AND ANAEROBIC Blood Culture adequate volume   Culture NO GROWTH 1 DAY    Report Status PENDING   CULTURE, BLOOD (ROUTINE X 2) w Reflex to ID Panel     Status: None  (Preliminary result)   Collection Time: 06/01/17  4:57 AM  Result Value Ref Range   Specimen Description BLOOD LEFT HAND    Special Requests      BOTTLES DRAWN AEROBIC AND ANAEROBIC Blood Culture adequate volume   Culture NO GROWTH 1 DAY    Report Status PENDING   Glucose, capillary     Status: Abnormal   Collection Time: 06/01/17  6:05 AM  Result Value Ref Range   Glucose-Capillary 112 (H) 65 - 99 mg/dL  Glucose, capillary     Status: Abnormal   Collection Time: 06/01/17 11:32 AM  Result Value Ref Range   Glucose-Capillary 189 (H) 65 - 99 mg/dL  Glucose, capillary     Status: Abnormal   Collection Time: 06/01/17  5:40 PM  Result Value Ref Range   Glucose-Capillary 125 (H) 65 - 99 mg/dL   Comment 1 Notify RN   Glucose, capillary     Status: Abnormal   Collection Time: 06/02/17 12:18 AM  Result Value Ref Range   Glucose-Capillary 120 (H) 65 - 99 mg/dL   Comment 1 Notify RN   Basic metabolic panel     Status: Abnormal   Collection Time: 06/02/17  4:50 AM  Result Value Ref Range   Sodium 138 135 - 145 mmol/L   Potassium 3.4 (L) 3.5 - 5.1 mmol/L   Chloride 113 (H) 101 - 111 mmol/L   CO2 18 (L) 22 - 32 mmol/L   Glucose, Bld 96 65 - 99 mg/dL   BUN <5 (L) 6 - 20 mg/dL   Creatinine, Ser 0.53 (L) 0.61 - 1.24 mg/dL   Calcium 8.0 (L) 8.9 - 10.3 mg/dL   GFR calc non Af Amer >60 >60 mL/min   GFR calc Af Amer >60 >60 mL/min    Comment: (NOTE) The eGFR has been calculated using the CKD EPI equation. This calculation has not been validated in all clinical situations. eGFR's persistently <60 mL/min signify possible Chronic Kidney Disease.    Anion gap 7 5 - 15  CBC     Status: Abnormal   Collection Time: 06/02/17  4:50 AM  Result Value Ref Range   WBC 17.5 (H) 3.8 - 10.6 K/uL   RBC 3.25 (L) 4.40 - 5.90 MIL/uL   Hemoglobin 9.9 (L) 13.0 - 18.0 g/dL   HCT 30.0 (L) 40.0 - 52.0 %   MCV 92.3 80.0 - 100.0 fL   MCH 30.4 26.0 - 34.0 pg   MCHC 33.0 32.0 - 36.0 g/dL   RDW 15.1 (H) 11.5 -  14.5 %   Platelets 153 150 - 440 K/uL  Magnesium     Status: None   Collection Time: 06/02/17  4:50 AM  Result Value Ref Range   Magnesium 1.7 1.7 - 2.4 mg/dL  Phosphorus     Status: None   Collection Time: 06/02/17  4:50 AM  Result Value Ref Range   Phosphorus 3.0 2.5 - 4.6 mg/dL  CULTURE, BLOOD (ROUTINE X 2) w Reflex to ID Panel     Status: None (Preliminary result)   Collection Time: 06/02/17  4:50 AM  Result Value Ref Range   Specimen Description BLOOD BLOOD LEFT HAND    Special Requests      BOTTLES DRAWN AEROBIC AND  ANAEROBIC Blood Culture adequate volume   Culture NO GROWTH < 12 HOURS    Report Status PENDING   TSH     Status: Abnormal   Collection Time: 06/02/17  4:50 AM  Result Value Ref Range   TSH 5.257 (H) 0.350 - 4.500 uIU/mL    Comment: Performed by a 3rd Generation assay with a functional sensitivity of <=0.01 uIU/mL.  CULTURE, BLOOD (ROUTINE X 2) w Reflex to ID Panel     Status: None (Preliminary result)   Collection Time: 06/02/17  4:57 AM  Result Value Ref Range   Specimen Description BLOOD RIGHT ANTECUBITAL    Special Requests      BOTTLES DRAWN AEROBIC AND ANAEROBIC Blood Culture adequate volume   Culture NO GROWTH < 12 HOURS    Report Status PENDING   Glucose, capillary     Status: None   Collection Time: 06/02/17  5:44 AM  Result Value Ref Range   Glucose-Capillary 94 65 - 99 mg/dL   Comment 1 Notify RN   Glucose, capillary     Status: Abnormal   Collection Time: 06/02/17 11:47 AM  Result Value Ref Range   Glucose-Capillary 135 (H) 65 - 99 mg/dL  Glucose, capillary     Status: Abnormal   Collection Time: 06/02/17  5:51 PM  Result Value Ref Range   Glucose-Capillary 195 (H) 65 - 99 mg/dL    Current Facility-Administered Medications  Medication Dose Route Frequency Provider Last Rate Last Dose  . acetaminophen (TYLENOL) tablet 650 mg  650 mg Oral Q6H PRN Lance Coon, MD   650 mg at 06/01/17 1438   Or  . acetaminophen (TYLENOL) suppository 650 mg   650 mg Rectal Q6H PRN Lance Coon, MD      . Chlorhexidine Gluconate Cloth 2 % PADS 6 each  6 each Topical Q0600 Vaughan Basta, MD   6 each at 06/02/17 0550  . enoxaparin (LOVENOX) injection 40 mg  40 mg Subcutaneous Q24H Lance Coon, MD   40 mg at 06/01/17 2243  . folic acid (FOLVITE) tablet 1 mg  1 mg Oral Daily Lance Coon, MD   1 mg at 06/02/17 1231  . insulin aspart (novoLOG) injection 0-9 Units  0-9 Units Subcutaneous Q6H Lance Coon, MD   2 Units at 06/02/17 1827  . morphine 2 MG/ML injection 2 mg  2 mg Intravenous Q4H PRN Lance Coon, MD   2 mg at 06/01/17 0255  . multivitamin with minerals tablet 1 tablet  1 tablet Oral Daily Lance Coon, MD   1 tablet at 06/02/17 1231  . mupirocin ointment (BACTROBAN) 2 % 1 application  1 application Nasal BID Vaughan Basta, MD   1 application at 77/82/42 1238  . ondansetron (ZOFRAN) tablet 4 mg  4 mg Oral Q6H PRN Lance Coon, MD       Or  . ondansetron [facility]) injection 4 mg  4 mg Intravenous Q6H PRN Lance Coon, MD      . pantoprazole (PROTONIX) injection 40 mg  40 mg Intravenous Q24H Nettie Elm, MD   40 mg at 06/02/17 1825  . piperacillin-tazobactam (ZOSYN) IVPB 3.375 g  3.375 g Intravenous Q8H Maccia, Melissa D, RPH 12.5 mL/hr at 06/02/17 1737 3.375 g at 06/02/17 1737  . potassium chloride SA (K-DUR,KLOR-CON) CR tablet 40 mEq  40 mEq Oral BID Vaughan Basta, MD   40 mEq at 06/02/17 1231  . thiamine (VITAMIN B-1) tablet 100 mg  100 mg Oral Daily Lance Coon, MD   100  mg at 06/02/17 1231   Or  . thiamine (B-1) injection 100 mg  100 mg Intravenous Daily Lance Coon, MD   100 mg at 06/01/17 1250  . vancomycin (VANCOCIN) IVPB 750 mg/150 ml premix  750 mg Intravenous Q12H Vaughan Basta, MD   Stopped at 06/02/17 1421    Musculoskeletal: Strength & Muscle Tone: decreased Gait & Station: unsteady Patient leans: N/A  Psychiatric Specialty Exam: Physical Exam  Nursing note and vitals  reviewed. Constitutional: He appears well-developed and well-nourished.  HENT:  Head: Normocephalic and atraumatic.  Eyes: Conjunctivae are normal. Pupils are equal, round, and reactive to light.  Neck: Normal range of motion.  Cardiovascular: Regular rhythm and normal heart sounds.  Respiratory: Effort normal. No respiratory distress.  GI: Soft.  Musculoskeletal: Normal range of motion.  Neurological: He is alert.  Skin: Skin is warm and dry.  Psychiatric: His affect is blunt. His speech is delayed. He is slowed and withdrawn. Thought content is not paranoid. He expresses inappropriate judgment. He expresses no homicidal and no suicidal ideation. He exhibits abnormal recent memory and abnormal remote memory.    Review of Systems  Constitutional: Negative.   HENT: Negative.   Eyes: Negative.   Respiratory: Negative.   Cardiovascular: Negative.   Gastrointestinal: Negative.   Musculoskeletal: Positive for myalgias.  Skin: Negative.   Neurological: Negative.   Psychiatric/Behavioral: Positive for memory loss. Negative for depression, hallucinations, substance abuse and suicidal ideas. The patient is not nervous/anxious and does not have insomnia.     Blood pressure 94/62, pulse 62, temperature (!) 95.6 F (35.3 C), temperature source Axillary, resp. rate 16, height '5\' 8"'$  (1.727 m), weight 130 lb 1.1 oz (59 kg), SpO2 97 %.Body mass index is 19.78 kg/m.  General Appearance: Casual  Eye Contact:  Fair  Speech:  Slow  Volume:  Decreased  Mood:  Euthymic  Affect:  Constricted  Thought Process:  Coherent  Orientation:  Negative  Thought Content:  Tangential  Suicidal Thoughts:  No  Homicidal Thoughts:  No  Memory:  Immediate;   Fair Recent;   Poor Remote;   Poor  Judgement:  Impaired  Insight:  Shallow  Psychomotor Activity:  Decreased  Concentration:  Concentration: Poor  Recall:  Poor  Fund of Knowledge:  Poor  Language:  Fair  Akathisia:  No  Handed:  Right  AIMS (if  indicated):     Assets:  Desire for Improvement Housing Social Support  ADL's:  Impaired  Cognition:  Impaired,  Mild  Sleep:        Treatment Plan Summary: Plan 56 year old man who has significant intellectual disability presumably related to his medical problems and also complicated by recent sepsis.  Alcohol may play a role as well.  Patient was pleasant and interactive but his short-term memory is quite poor and his ability to understand medical questions seem to be very limited.  Under the circumstances I think it would be very unlikely that he could make much of an informed decision about any major question.  He was quite clear however that he preferred to have his mother rather than his girlfriend make his medical decisions.  In this case I think it is best then do stick with the mother or parents.  Even if he were to wish to change it to his girlfriend I am not sure that he really has the capacity to do that right now given his limited cognition.  No need for any particular psychiatric treatment.  Follow-up as  needed.  Disposition: Patient does not meet criteria for psychiatric inpatient admission. Supportive therapy provided about ongoing stressors.  Alethia Berthold, MD 06/02/2017 7:20 PM

## 2017-06-02 NOTE — Progress Notes (Signed)
Notified MD of temps, unable to get rectal temp at this time. New order for 1051ml bolus given over 2 hours. [employer] Customer service manager. Post bear hugger temp axillary 95.4

## 2017-06-02 NOTE — Progress Notes (Signed)
Nutrition Follow Up Note   DOCUMENTATION CODES:   Severe malnutrition in context of acute illness/injury  INTERVENTION:   Recommend monitor K, P, and Mg; pt at high refeeding risk  Premier Protein BID, each supplement provides 160 kcal and 30 grams of protein.   MVI daily   Snacks  NUTRITION DIAGNOSIS:   Severe Malnutrition related to cancer and cancer related treatments as evidenced by moderate fat depletions, moderate to severe muscle depletions, 16 percent weight loss in 6 months.  GOAL:   Patient will meet greater than or equal to 90% of their needs  MONITOR:   PO intake, Supplement acceptance, Labs, Weight trends, I & O's  REASON FOR ASSESSMENT:   Malnutrition Screening Tool    ASSESSMENT:   56 y.o. 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   11/2016- EDG and colonoscopy- pt diagnosed with colon mass found to be adenocarcinoma    11/30/2016 CT abdomen and pelvis with contrast showed no evidence of metastatic disease.   CT head showed no acute intracranial  04/17/2017- s/p low anterior resection with primary reanastomosis for rectosigmoid adenocarcinoma. Pathology reviewed stage III colon cancer.  Met with pt in room today. Pt reports intermittent poor appetite and oral intake for the past 6 months pta. Pt diagnosed with rectal cancer in June and since then has lost 21lbs(16%) in 6 months; this is severe weight loss. Pt reports that he has diarrhea 20-30 times a day. This is not improved with creon per pt report. Per MD note, pt non compliant with meds. Pt does not drink Ensure/Boost.  Pt drinking Premier Protein here in hospital. RD discussed with pt the importance of adequate protein intake and explained the reason behind why he needs to drink the supplements. Pt ate 50% of his lunch today. Pt was eating cookies and ice cream at time of RD visit. Pt with h/o etoh abuse and therefore is at risk for many nutrient deficiencies. Pt currently on CIWA protocol. Pt likely at high refeeding risk; recommend monitor K, P, and Mg.  Oncology recommending chemotherapy; pt refusing this currently.   Medications reviewed and include: lovenox, folic acid, insulin, MVI, protonix, KCl, thiamine, zosyn, vancomycin   Labs reviewed: K 3.4(L), Cl 113(H), BUN <5(L), creat 0.53(L), Ca 8.0(L), P 3.0 wnl, Mg 1.7 wnl Wbc- 17.5(H), Hgb 9.9(L), Hct 30.0(L) cbgs- 96, 125, 96 x 48hrs AIC- 11.8(H)- 05/2015  Nutrition-Focused physical exam completed. Findings are moderate fat depletions in buccal and orbital regions, arms, and chest, moderate muscle depletions in scapula and shoulders, severe muscle depletions in BLE, and mild edema RUE.   Diet Order:  Diet Carb Modified Fluid consistency: Thin; Room service appropriate? Yes  EDUCATION NEEDS:   Education needs have been addressed  Skin:  Skin Assessment: (incision chest)  Last BM:  12/4- type 6  Height:   Ht Readings from Last 1 Encounters:  05/29/17 _0  (1.727 m)    Weight:   Wt Readings from Last 1 Encounters:  05/30/17 130 lb 1.1 oz (59 kg)    Ideal Body Weight:  70 kg  BMI:  Body mass index is 19.78 kg/m.  Estimated Nutritional Needs:   Kcal:  1700-2000kcal/day  Protein:  74-84g/day   Fluid:  >1.5L/day   Koleen Distance MS, RD, LDN Pager #[phone] After Hours Pager: [phone]

## 2017-06-03 ENCOUNTER — Ambulatory Visit: Payer: Medicaid Other | Admitting: Gastroenterology

## 2017-06-03 LAB — BASIC METABOLIC PANEL
ANION GAP: 8 (ref 5–15)
BUN: <5 mg/dL — ABNORMAL LOW (ref 6–20)
CALCIUM: 8.1 mg/dL — AB (ref 8.9–10.3)
CO2: 20 mmol/L — AB (ref 22–32)
Chloride: 113 mmol/L — ABNORMAL HIGH (ref 101–111)
Creatinine, Ser: 0.65 mg/dL (ref 0.61–1.24)
GLUCOSE: 102 mg/dL — AB (ref 65–99)
POTASSIUM: 3.9 mmol/L (ref 3.5–5.1)
Sodium: 141 mmol/L (ref 135–145)

## 2017-06-03 LAB — URINALYSIS, COMPLETE (UACMP) WITH MICROSCOPIC
BACTERIA UA: NONE SEEN
Bilirubin Urine: NEGATIVE
GLUCOSE, UA: NEGATIVE mg/dL
KETONES UR: NEGATIVE mg/dL
Nitrite: NEGATIVE
PH: 5 (ref 5.0–8.0)
Protein, ur: NEGATIVE mg/dL
SPECIFIC GRAVITY, URINE: 1.004 — AB (ref 1.005–1.030)

## 2017-06-03 LAB — AEROBIC/ANAEROBIC CULTURE W GRAM STAIN (SURGICAL/DEEP WOUND)

## 2017-06-03 LAB — CBC
HEMATOCRIT: 27.1 % — AB (ref 40.0–52.0)
HEMOGLOBIN: 9.1 g/dL — AB (ref 13.0–18.0)
MCH: 30.8 pg (ref 26.0–34.0)
MCHC: 33.6 g/dL (ref 32.0–36.0)
MCV: 91.7 fL (ref 80.0–100.0)
Platelets: 155 10*3/uL (ref 150–440)
RBC: 2.95 MIL/uL — AB (ref 4.40–5.90)
RDW: 15.2 % — ABNORMAL HIGH (ref 11.5–14.5)
WBC: 16.6 10*3/uL — ABNORMAL HIGH (ref 3.8–10.6)

## 2017-06-03 LAB — AEROBIC/ANAEROBIC CULTURE (SURGICAL/DEEP WOUND)

## 2017-06-03 LAB — CULTURE, BLOOD (SINGLE)
CULTURE: NO GROWTH
Special Requests: ADEQUATE

## 2017-06-03 LAB — VANCOMYCIN, TROUGH: VANCOMYCIN TR: 15 ug/mL (ref 15–20)

## 2017-06-03 LAB — GLUCOSE, CAPILLARY
GLUCOSE-CAPILLARY: 171 mg/dL — AB (ref 65–99)
Glucose-Capillary: 102 mg/dL — ABNORMAL HIGH (ref 65–99)
Glucose-Capillary: 194 mg/dL — ABNORMAL HIGH (ref 65–99)
Glucose-Capillary: 48 mg/dL — ABNORMAL LOW (ref 65–99)
Glucose-Capillary: 93 mg/dL (ref 65–99)

## 2017-06-03 LAB — PHOSPHORUS: PHOSPHORUS: 4.2 mg/dL (ref 2.5–4.6)

## 2017-06-03 LAB — MAGNESIUM: MAGNESIUM: 1.6 mg/dL — AB (ref 1.7–2.4)

## 2017-06-03 MED ORDER — SODIUM CHLORIDE 0.9% FLUSH: 10.0000 mL | INTRAVENOUS | Status: DC | PRN

## 2017-06-03 MED ORDER — DEXTROSE 50 % IV SOLN
INTRAVENOUS | Status: AC
  Administered 2017-06-04: 50 mL
  Filled 2017-06-03: qty 50

## 2017-06-03 MED ORDER — SODIUM CHLORIDE 0.9% FLUSH
10.0000 mL | Freq: Two times a day (BID) | INTRAVENOUS | Status: DC
  Administered 2017-06-03 – 2017-06-04 (×2): 10 mL

## 2017-06-03 MED ORDER — GUAIFENESIN ER 600 MG PO TB12
600.0000 mg | ORAL_TABLET | Freq: Two times a day (BID) | ORAL | Status: DC
  Administered 2017-06-03 (×2): 600 mg via ORAL
  Filled 2017-06-03 (×3): qty 1

## 2017-06-03 MED ORDER — MAGNESIUM OXIDE 400 (241.3 MG) MG PO TABS
400.0000 mg | ORAL_TABLET | Freq: Once | ORAL | Status: AC
  Administered 2017-06-03: 400 mg via ORAL
  Filled 2017-06-03: qty 1

## 2017-06-03 MED ORDER — SODIUM CHLORIDE 0.9 % IV SOLN
INTRAVENOUS | Status: DC
  Administered 2017-06-03 (×2): via INTRAVENOUS

## 2017-06-03 NOTE — Progress Notes (Addendum)
MEDICATION RELATED CONSULT NOTE -  Pharmacy Consult for electrolyte management for 56 yo male admitted with infected port. Patient with hypokalemia. Patient currently in surgery. Patient received potassium 23mEq IV x 4 and magnesium 2g IV 1. Patient is receiving NS/77mEq of Potassium at 25mL/hr.   Plan:  Will order BMP/Magnesium/Phosphorus at 2000.   05/29/2017 1806 K 2.9, Mg 1.7, phos 3.2. CCM has ordered magnesium 1 gm IV x 1 and KCl 10 mEq IV Q1H x 6 doses. Will continue current regimen and recheck electrolytes tomorrow with AM labs.   12/1 K 4.0, phos 2.8, Mag 2.0 -  No supp needed at this time. Will recheck labs in AM.   12/2 1444 K 3.3 Will continue current supplement order of Klor-Con 40 mEq po BID, give additional 10 mEq IV Q1H x 2 doses, and recheck electrolytes tomorrow with AM labs.   12/3: K= 3.1. Will continue current supplementation of KCl 40 mEq PO BID. Will give an additional 10 mEq IV x 3 doses IV and recheck electrolytes in am.  12/4: k= 3.4. Will continue current supplemetation of KCl 40 mEq PO BID. Since lab value is close to therapeutic. Will not supplement today. Will recheck in am.    12/4: Magnesium: 1.6; K=3.9. Will give Magnesium 400 mg PO x 1. Will recheck Magnesium in am.   Allergies  Allergen Reactions  . Aspirin Itching and Other (See Comments)    Reaction: abdominal pain    Patient Measurements: Height: 5\' 8"  (172.7 cm) Weight: 130 lb 1.1 oz (59 kg) IBW/kg (Calculated) : 68.4  Vital Signs: Temp: 98.7 F (37.1 C) (12/05 0808) Temp Source: Oral (12/05 0808) BP: 120/72 (12/05 3335) Pulse Rate: 83 (12/05 0808) Intake/Output from previous day: 12/04 0701 - 12/05 0700 In: 1223 [P.O.:823; IV Piggyback:400] Out: 860 [Urine:860] Intake/Output from this shift: No intake/output data recorded.  Labs: Recent Labs    06/01/17 0450 06/02/17 0450 06/03/17 0620  WBC 6.5 17.5* 16.6*  HGB 8.3* 9.9* 9.1*  HCT 25.4* 30.0* 27.1*  PLT 121* 153 155   CREATININE 0.48* 0.53* 0.65  MG 1.8 1.7 1.6*  PHOS 3.2 3.0 4.2   Estimated Creatinine Clearance: 85 mL/min (by C-G formula based on SCr of 0.65 mg/dL).    Medical History: Past Medical History:  Diagnosis Date  . Anemia   . BPH without obstruction/lower urinary tract symptoms   . Cancer ([place])   . Cellulitis of scrotum   . Cyst of prostate 05/13/2015  . Diabetes ([place])   . Difficulty urinating   . Dyspnea   . Epididymoorchitis   . ETOH abuse   . Fracture of right tibia and fibula   . GERD (gastroesophageal reflux disease)   . GI bleed   . Malignant neoplasm of sigmoid colon ([place])   . Pancreatitis   . Trichomoniasis   . Wears dentures    Partial upper and lower.  reports poor fit.    Pharmacy will continue to monitor and adjust per consult.   Siniya Lichty D, Pharm.D., BCPS Clinical Pharmacist 06/03/2017,9:34 AM

## 2017-06-03 NOTE — Evaluation (Addendum)
Physical Therapy Evaluation Patient Details Name: [name] MRN: [id number] DOB: 1961-05-18 Today's Date: 06/03/2017   History of Present Illness  Pt admitted for an infected port site.  PMH includes trichomoniasis, CA, DM, CI bleed, GERD, R tibial and fibular fracture, ETOH abuse and cellulitis.  Clinical Impression  Pt is a 56 year old male who lives in a one story home with his girlfriend.  He reports having a RW and SPC at home, though he does no usually use either.  Pt presented with decreased balance, as well as generalized UE and LE weakness.  He was able to perform bed mobility with use of bed rail.  Pt was able to initiate STS from a higher surface but required min assist from a low surface such as a toilet.  Pt presented with a slow steady gait and was able to ambulate 30 ft in room using a RW.  He is able to perform sidestepping and retro stepping with caution.  Pt will continue to benefit from skilled PT with focus on strength, functional mobility and balance.    Follow Up Recommendations Home health PT    Equipment Recommendations       Recommendations for Other Services       Precautions / Restrictions Precautions Precautions: Fall Restrictions Weight Bearing Restrictions: No      Mobility  Bed Mobility Overal bed mobility: Modified Independent                Transfers Overall transfer level: Needs assistance Equipment used: Rolling walker (2 wheeled) Transfers: Sit to/from Stand Sit to Stand: Min assist         General transfer comment: Min assistance for initiation of STS and controlled lowering when sitting on a lower surface.  PT educated pt concerning body mechanics for efficiency and safe mobility.  Ambulation/Gait Ambulation/Gait assistance: Min guard Ambulation Distance (Feet): 30 Feet Assistive device: Rolling walker (2 wheeled)     Gait velocity interpretation: at or above normal speed for age/gender General Gait Details: Pt presents  with a slow gait with wide BOS and low foot clearance.  He maintains a flexed posture and relies on the RW for balance.  Stairs            Wheelchair Mobility    Modified Rankin (Stroke Patients Only)       Balance Overall balance assessment: Needs assistance Sitting-balance support: Bilateral upper extremity supported       Standing balance support: Bilateral upper extremity supported   Standing balance comment: Pt remains in a flexed posture with wide BOS, appearing as though he feels unsteady.  Pt is able to perform retro and side stepping with a slow gait and min VC's.                             Pertinent Vitals/Pain Pain Assessment: No/denies pain    Home Living Family/patient expects to be discharged to:: Private residence Living Arrangements: Spouse/significant other Available Help at Discharge: Family Type of Home: House Home Access: Level entry     Home Layout: One level Home Equipment: Environmental consultant - 2 wheels;Cane - single point      Prior Function Level of Independence: Independent with assistive device(s)               Hand Dominance        Extremity/Trunk Assessment   Upper Extremity Assessment Upper Extremity Assessment: Overall WFL for tasks assessed  Lower Extremity Assessment Lower Extremity Assessment: Overall WFL for tasks assessed    Cervical / Trunk Assessment Cervical / Trunk Assessment: Normal  Communication   Communication: HOH  Cognition Arousal/Alertness: Lethargic Behavior During Therapy: WFL for tasks assessed/performed Overall Cognitive Status: Within Functional Limits for tasks assessed                                        General Comments      Exercises     Assessment/Plan    PT Assessment Patient needs continued PT services  PT Problem List Decreased strength;Decreased balance;Decreased mobility       PT Treatment Interventions DME instruction;Gait training;Functional  mobility training;Therapeutic activities;Therapeutic exercise;Balance training;Patient/family education    PT Goals (Current goals can be found in the Care Plan section)  Acute Rehab PT Goals Patient Stated Goal: to return home and regain strength. PT Goal Formulation: With patient Time For Goal Achievement: 06/17/17 Potential to Achieve Goals: Good    Frequency Min 2X/week   Barriers to discharge        Co-evaluation               AM-PAC PT "6 Clicks" Daily Activity  Outcome Measure Difficulty turning over in bed (including adjusting bedclothes, sheets and blankets)?: A Little Difficulty moving from lying on back to sitting on the side of the bed? : A Little Difficulty sitting down on and standing up from a chair with arms (e.g., wheelchair, bedside commode, etc,.)?: A Little Help needed moving to and from a bed to chair (including a wheelchair)?: A Little Help needed walking in hospital room?: A Little Help needed climbing 3-5 steps with a railing? : A Little 6 Click Score: 18    End of Session Equipment Utilized During Treatment: Gait belt Activity Tolerance: Patient tolerated treatment well Patient left: in chair;with call bell/phone within reach;with chair alarm set;with family/visitor present Nurse Communication: Mobility status PT Visit Diagnosis: Unsteadiness on feet (R26.81);Other abnormalities of gait and mobility (R26.89);Muscle weakness (generalized) (M62.81)    Time: 2353-6144 PT Time Calculation (min) (ACUTE ONLY): 35 min   Charges:   PT Evaluation $PT Eval Low Complexity: 1 Low PT Treatments $Therapeutic Activity: 8-22 mins   PT G Codes:   PT G-Codes **NOT FOR INPATIENT CLASS** Functional Assessment Tool Used: AM-PAC 6 Clicks Basic Mobility Functional Limitation: Mobility: Walking and moving around Mobility: Walking and Moving Around Current Status (R1540): At least 40 percent but less than 60 percent impaired, limited or restricted Mobility:  Walking and Moving Around Goal Status [phone]): At least 1 percent but less than 20 percent impaired, limited or restricted    Roxanne Gates, PT, DPT   Roxanne Gates 06/03/2017, 6:59 PM   Addendum: Treatment frequency updated/corrected to reflect correct treatment frequency based on diagnosis per rehab guidelines. [name] Milford H. Owens Shark, PT, DPT, NCS 06/03/17, 7:27 PM [phone]

## 2017-06-03 NOTE — Progress Notes (Signed)
Spoke with Margreta Journey with the ethics Committee. Per Margreta Journey, mother trumps girlfriends decisions. Unless pt is medically stable to make own decisions, the mother is responsible at this time.

## 2017-06-03 NOTE — Ethics Note (Signed)
Received phone call today at 1020 from patients nurse Janett Billow). Wanting to verify who had legal responsibility to make medical decisions for the patient reguarding a PICC line placement.  After review that Dr Weber Cooks does not deem patient competent to make those decisions for himself and the patient is not married, health care decisions would need to be made by his parents (and this case his mother).

## 2017-06-03 NOTE — Progress Notes (Signed)
Pt up in chair with PT. One assist to [facility]. Visitors at bedside. IV antibiotics infusing.

## 2017-06-03 NOTE — Progress Notes (Addendum)
Patient ID: [name], male   DOB: 1961/02/14, 56 y.o.   MRN: [id number]  [facility] PROGRESS NOTE  AEDON DEASON ZJQ:[id number] DOB: [birth year] DOA: 05/28/2017 PCP: [name], MD  HPI/Subjective: Patient now agreeable to having a PICC line placed and a TEE done         Objective: Vitals:   06/02/17 2005 06/03/17 0808  BP: 112/67 120/72  Pulse: 82 83  Resp: 16 16  Temp: 97.9 F (36.6 C) 98.7 F (37.1 C)  SpO2: 97% 100%    Intake/Output Summary (Last 24 hours) at 06/03/2017 1319 Last data filed at 06/03/2017 0549 Gross per 24 hour  Intake 400 ml  Output 860 ml  Net -460 ml   Filed Weights   05/28/17 1720 05/29/17 1630 05/30/17 1000  Weight: 105 lb (47.6 kg) 123 lb 7.3 oz (56 kg) 130 lb 1.1 oz (59 kg)    ROS: Review of Systems  Constitutional: Positive for malaise/fatigue. Negative for chills, fever and weight loss.  HENT: Negative for congestion, nosebleeds and tinnitus.   Eyes: Negative for blurred vision and photophobia.  Respiratory: Negative for cough, hemoptysis and shortness of breath.   Cardiovascular: Negative for chest pain, palpitations and leg swelling.  Gastrointestinal: Negative for abdominal pain, blood in stool, heartburn, nausea and vomiting.  Genitourinary: Negative for frequency, hematuria and urgency.  Musculoskeletal: Negative for myalgias.  Skin: Negative for rash.  Neurological: Negative for dizziness, tremors, sensory change, focal weakness and seizures.  Psychiatric/Behavioral: Negative for depression.   Exam: Physical Exam  Constitutional: He is oriented to person, place, and time.  HENT:  Nose: No mucosal edema.  Mouth/Throat: No oropharyngeal exudate or posterior oropharyngeal edema.  Eyes: Conjunctivae and lids are normal. Pupils are equal, round, and reactive to light.  Neck: No JVD present. Carotid bruit is not present. No edema present. No thyroid mass and no thyromegaly present.  Cardiovascular: S1 normal and  S2 normal. Exam reveals no gallop.  No murmur heard. Pulses:      Dorsalis pedis pulses are 2+ on the right side, and 2+ on the left side.  Respiratory: No respiratory distress. He has no wheezes. He has no rhonchi. He has no rales.  GI: Soft. Bowel sounds are normal. There is no tenderness.  Musculoskeletal:       Right ankle: He exhibits no swelling.       Left ankle: He exhibits no swelling.  Lymphadenopathy:    He has no cervical adenopathy.  Neurological: He is alert and oriented to person, place, and time.  Skin: Skin is warm. Nails show no clubbing.  Dressing on right upper chest.  Psychiatric:  He is awake, and oriented today, confusion seems cleared up.      Data Reviewed: Basic Metabolic Panel: Recent Labs  Lab 05/30/17 0525 05/31/17 0501 05/31/17 1444 06/01/17 0450 06/02/17 0450 06/03/17 0620  NA 137 138  --  138 138 141  K 4.0 2.8* 3.3* 3.1* 3.4* 3.9  CL 112* 115*  --  117* 113* 113*  CO2 20* 19*  --  18* 18* 20*  GLUCOSE 137* 96  --  125* 96 102*  BUN 7 6  --  <5* <5* <5*  CREATININE 0.41* 0.33*  --  0.48* 0.53* 0.65  CALCIUM 7.5* 7.5*  --  7.6* 8.0* 8.1*  MG 2.0 1.9  --  1.8 1.7 1.6*  PHOS 2.8 2.8  --  3.2 3.0 4.2   Liver Function Tests: Recent Labs  Lab  05/28/17 1820 05/29/17 0352 05/29/17 1806  AST 23 23 31   ALT 20 16* 20  ALKPHOS 142* 125 154*  BILITOT 0.3 0.5 0.5  PROT 6.3* 5.2* 5.7*  ALBUMIN 2.7* 2.1* 2.2*   CBC: Recent Labs  Lab 05/28/17 1820  05/30/17 0525 05/31/17 0501 06/01/17 0450 06/02/17 0450 06/03/17 0620  WBC 23.7*   < > 12.1* 7.6 6.5 17.5* 16.6*  NEUTROABS 20.6*  --   --   --   --   --   --   HGB 9.4*   < > 8.8* 8.6* 8.3* 9.9* 9.1*  HCT 28.5*   < > 27.0* 26.2* 25.4* 30.0* 27.1*  MCV 93.2   < > 94.2 93.6 93.1 92.3 91.7  PLT 172   < > 163 145* 121* 153 155   < > = values in this interval not displayed.   Cardiac Enzymes: Recent Labs  Lab 05/28/17 1820  TROPONINI 0.07*    CBG: Recent Labs  Lab 06/02/17 1147  06/02/17 1751 06/02/17 2356 06/03/17 0541 06/03/17 1200  GLUCAP 135* 195* 102* 93 194*    Recent Results (from the past 240 hour(s))  Culture, blood (Routine x 2)     Status: Abnormal   Collection Time: 05/28/17  6:21 PM  Result Value Ref Range Status   Specimen Description BLOOD BLOOD LEFT ARM  Final   Special Requests   Final    BOTTLES DRAWN AEROBIC AND ANAEROBIC Blood Culture adequate volume   Culture  Setup Time   Final    GRAM POSITIVE COCCI IN BOTH AEROBIC AND ANAEROBIC BOTTLES CRITICAL RESULT CALLED TO, READ BACK BY AND VERIFIED WITH: KRISTIN MERRILL AT [id number] 05/29/17 SDR    Culture METHICILLIN RESISTANT STAPHYLOCOCCUS AUREUS (A)  Final   Report Status 05/31/2017 FINAL  Final   Organism ID, Bacteria METHICILLIN RESISTANT STAPHYLOCOCCUS AUREUS  Final      Susceptibility   Methicillin resistant staphylococcus aureus - MIC*    CIPROFLOXACIN >=8 RESISTANT Resistant     ERYTHROMYCIN >=8 RESISTANT Resistant     GENTAMICIN <=0.5 SENSITIVE Sensitive     OXACILLIN >=4 RESISTANT Resistant     TETRACYCLINE <=1 SENSITIVE Sensitive     VANCOMYCIN <=0.5 SENSITIVE Sensitive     TRIMETH/SULFA <=10 SENSITIVE Sensitive     CLINDAMYCIN RESISTANT Resistant     RIFAMPIN <=0.5 SENSITIVE Sensitive     Inducible Clindamycin POSITIVE Resistant     * METHICILLIN RESISTANT STAPHYLOCOCCUS AUREUS  Blood Culture ID Panel (Reflexed)     Status: Abnormal   Collection Time: 05/28/17  6:21 PM  Result Value Ref Range Status   Enterococcus species NOT DETECTED NOT DETECTED Final   Listeria monocytogenes NOT DETECTED NOT DETECTED Final   Staphylococcus species DETECTED (A) NOT DETECTED Final    Comment: CRITICAL RESULT CALLED TO, READ BACK BY AND VERIFIED WITH:  KRISTIN MERRILL AT [id number] 05/29/17 SDR    Staphylococcus aureus DETECTED (A) NOT DETECTED Final    Comment: Methicillin (oxacillin)-resistant Staphylococcus aureus (MRSA). MRSA is predictably resistant to beta-lactam antibiotics (except  ceftaroline). Preferred therapy is vancomycin unless clinically contraindicated. Patient requires contact precautions if  hospitalized. CRITICAL RESULT CALLED TO, READ BACK BY AND VERIFIED WITH:  KRISTIN MERRILL AT 4765 05/29/17 SDR    Methicillin resistance DETECTED (A) NOT DETECTED Final    Comment: CRITICAL RESULT CALLED TO, READ BACK BY AND VERIFIED WITH:  KRISTIN MERRILL AT 4650 05/29/17 SDR    Streptococcus species NOT DETECTED NOT DETECTED Final   Streptococcus agalactiae NOT  DETECTED NOT DETECTED Final   Streptococcus pneumoniae NOT DETECTED NOT DETECTED Final   Streptococcus pyogenes NOT DETECTED NOT DETECTED Final   Acinetobacter baumannii NOT DETECTED NOT DETECTED Final   Enterobacteriaceae species NOT DETECTED NOT DETECTED Final   Enterobacter cloacae complex NOT DETECTED NOT DETECTED Final   Escherichia coli NOT DETECTED NOT DETECTED Final   Klebsiella oxytoca NOT DETECTED NOT DETECTED Final   Klebsiella pneumoniae NOT DETECTED NOT DETECTED Final   Proteus species NOT DETECTED NOT DETECTED Final   Serratia marcescens NOT DETECTED NOT DETECTED Final   Haemophilus influenzae NOT DETECTED NOT DETECTED Final   Neisseria meningitidis NOT DETECTED NOT DETECTED Final   Pseudomonas aeruginosa NOT DETECTED NOT DETECTED Final   Candida albicans NOT DETECTED NOT DETECTED Final   Candida glabrata NOT DETECTED NOT DETECTED Final   Candida krusei NOT DETECTED NOT DETECTED Final   Candida parapsilosis NOT DETECTED NOT DETECTED Final   Candida tropicalis NOT DETECTED NOT DETECTED Final  Culture, blood (Routine x 2)     Status: Abnormal   Collection Time: 05/28/17  7:10 PM  Result Value Ref Range Status   Specimen Description BLOOD LEFT ANTECUBITAL  Final   Special Requests   Final    BOTTLES DRAWN AEROBIC AND ANAEROBIC Blood Culture adequate volume   Culture  Setup Time   Final    GRAM POSITIVE COCCI IN BOTH AEROBIC AND ANAEROBIC BOTTLES CRITICAL VALUE NOTED.  VALUE IS CONSISTENT  WITH PREVIOUSLY REPORTED AND CALLED VALUE.    Culture (A)  Final    STAPHYLOCOCCUS AUREUS SUSCEPTIBILITIES PERFORMED ON PREVIOUS CULTURE WITHIN THE LAST 5 DAYS. Performed at [facility], [place] [address] [place], [place] [address]    Report Status 05/31/2017 FINAL  Final  Surgical pcr screen     Status: Abnormal   Collection Time: 05/28/17 10:44 PM  Result Value Ref Range Status   MRSA, PCR POSITIVE (A) NEGATIVE Final    Comment: RESULT CALLED TO, READ BACK BY AND VERIFIED WITH: MAT PAGE ON 05/29/17 AT 9767 BY JAG    Staphylococcus aureus POSITIVE (A) NEGATIVE Final    Comment: RESULT CALLED TO, READ BACK BY AND VERIFIED WITH: MAT PAGE ON 05/29/17 AT 0213 BY JAG (NOTE) The Xpert SA Assay (FDA approved for NASAL specimens in patients 28 years of age and older), is one component of a comprehensive surveillance program. It is not intended to diagnose infection nor to guide or monitor treatment.   Aerobic/Anaerobic Culture (surgical/deep wound)     Status: None (Preliminary result)   Collection Time: 05/29/17  1:31 PM  Result Value Ref Range Status   Specimen Description WOUND RIGHT UPPER CHEST  Final   Special Requests NONE  Final   Gram Stain   Final    FEW WBC PRESENT, PREDOMINANTLY PMN FEW GRAM POSITIVE COCCI IN CLUSTERS Performed at [facility], [place] [address] [place], [place] [address]    Culture   Final    FEW METHICILLIN RESISTANT STAPHYLOCOCCUS AUREUS NO ANAEROBES ISOLATED; CULTURE IN PROGRESS FOR 5 DAYS    Report Status PENDING  Incomplete   Organism ID, Bacteria METHICILLIN RESISTANT STAPHYLOCOCCUS AUREUS  Final      Susceptibility   Methicillin resistant staphylococcus aureus - MIC*    CIPROFLOXACIN >=8 RESISTANT Resistant     ERYTHROMYCIN >=8 RESISTANT Resistant     GENTAMICIN <=0.5 SENSITIVE Sensitive     OXACILLIN >=4 RESISTANT Resistant     TETRACYCLINE <=1 SENSITIVE Sensitive  VANCOMYCIN <=0.5 SENSITIVE Sensitive     TRIMETH/SULFA <=10  SENSITIVE Sensitive     CLINDAMYCIN RESISTANT Resistant     RIFAMPIN <=0.5 SENSITIVE Sensitive     Inducible Clindamycin POSITIVE Resistant     * FEW METHICILLIN RESISTANT STAPHYLOCOCCUS AUREUS  Culture, blood (single) w Reflex to ID Panel     Status: None   Collection Time: 05/29/17  6:06 PM  Result Value Ref Range Status   Specimen Description BLOOD LEFT ASSIST CONTROL  Final   Special Requests   Final    BOTTLES DRAWN AEROBIC AND ANAEROBIC Blood Culture adequate volume   Culture NO GROWTH 5 DAYS  Final   Report Status 06/03/2017 FINAL  Final  CULTURE, BLOOD (ROUTINE X 2) w Reflex to ID Panel     Status: None (Preliminary result)   Collection Time: 05/30/17 11:35 AM  Result Value Ref Range Status   Specimen Description BLOOD LEFT ASSIST CONTROL  Final   Special Requests   Final    BOTTLES DRAWN AEROBIC AND ANAEROBIC Blood Culture adequate volume   Culture NO GROWTH 4 DAYS  Final   Report Status PENDING  Incomplete  CULTURE, BLOOD (ROUTINE X 2) w Reflex to ID Panel     Status: None (Preliminary result)   Collection Time: 05/30/17 11:59 AM  Result Value Ref Range Status   Specimen Description BLOOD LEFT HAND  Final   Special Requests   Final    BOTTLES DRAWN AEROBIC AND ANAEROBIC Blood Culture adequate volume   Culture NO GROWTH 4 DAYS  Final   Report Status PENDING  Incomplete  CULTURE, BLOOD (ROUTINE X 2) w Reflex to ID Panel     Status: None (Preliminary result)   Collection Time: 05/31/17  5:01 AM  Result Value Ref Range Status   Specimen Description BLOOD LEFT ANTECUBITAL  Final   Special Requests   Final    BOTTLES DRAWN AEROBIC AND ANAEROBIC Blood Culture adequate volume   Culture NO GROWTH 3 DAYS  Final   Report Status PENDING  Incomplete  CULTURE, BLOOD (ROUTINE X 2) w Reflex to ID Panel     Status: None (Preliminary result)   Collection Time: 05/31/17  5:10 AM  Result Value Ref Range Status   Specimen Description BLOOD LEFT HAND  Final   Special Requests   Final     BOTTLES DRAWN AEROBIC AND ANAEROBIC Blood Culture adequate volume   Culture NO GROWTH 3 DAYS  Final   Report Status PENDING  Incomplete  CULTURE, BLOOD (ROUTINE X 2) w Reflex to ID Panel     Status: None (Preliminary result)   Collection Time: 06/01/17  4:50 AM  Result Value Ref Range Status   Specimen Description BLOOD LEFT AC  Final   Special Requests   Final    BOTTLES DRAWN AEROBIC AND ANAEROBIC Blood Culture adequate volume   Culture NO GROWTH 2 DAYS  Final   Report Status PENDING  Incomplete  CULTURE, BLOOD (ROUTINE X 2) w Reflex to ID Panel     Status: None (Preliminary result)   Collection Time: 06/01/17  4:57 AM  Result Value Ref Range Status   Specimen Description BLOOD LEFT HAND  Final   Special Requests   Final    BOTTLES DRAWN AEROBIC AND ANAEROBIC Blood Culture adequate volume   Culture NO GROWTH 2 DAYS  Final   Report Status PENDING  Incomplete  CULTURE, BLOOD (ROUTINE X 2) w Reflex to ID Panel     Status: None (  Preliminary result)   Collection Time: 06/02/17  4:50 AM  Result Value Ref Range Status   Specimen Description BLOOD BLOOD LEFT HAND  Final   Special Requests   Final    BOTTLES DRAWN AEROBIC AND ANAEROBIC Blood Culture adequate volume   Culture NO GROWTH 1 DAY  Final   Report Status PENDING  Incomplete  CULTURE, BLOOD (ROUTINE X 2) w Reflex to ID Panel     Status: None (Preliminary result)   Collection Time: 06/02/17  4:57 AM  Result Value Ref Range Status   Specimen Description BLOOD RIGHT ANTECUBITAL  Final   Special Requests   Final    BOTTLES DRAWN AEROBIC AND ANAEROBIC Blood Culture adequate volume   Culture NO GROWTH 1 DAY  Final   Report Status PENDING  Incomplete     Studies: Dg Chest 1 View  Result Date: 06/02/2017 CLINICAL DATA:  Hypothermia EXAM: CHEST 1 VIEW COMPARISON:  May 28, 2017 FINDINGS: There is a small left pleural effusion. There is patchy airspace consolidation in both mid and lower lung zones. Consolidation is most notable in  the medial left base. Heart size and pulmonary vascularity are normal. No adenopathy. There is evidence of old rib trauma on the left. IMPRESSION: Patchy airspace opacity bilaterally, most notably in the lower lung zones, slightly more severe on the left than on the right. The appearance felt to represent multifocal pneumonia. Small left pleural effusion. Heart size within normal limits. No evident adenopathy. Electronically Signed   By: Lowella Grip III M.D.   On: 06/02/2017 10:47    Scheduled Meds: . Chlorhexidine Gluconate Cloth  6 each Topical Q0600  . enoxaparin (LOVENOX) injection  40 mg Subcutaneous Q24H  . folic acid  1 mg Oral Daily  . guaiFENesin  600 mg Oral BID  . insulin aspart  0-9 Units Subcutaneous Q6H  . multivitamin with minerals  1 tablet Oral Daily  . mupirocin ointment  1 application Nasal BID  . pantoprazole (PROTONIX) IV  40 mg Intravenous Q24H  . potassium chloride  40 mEq Oral BID  . sodium chloride flush  10-40 mL Intracatheter Q12H  . thiamine  100 mg Oral Daily   Or  . thiamine  100 mg Intravenous Daily   Continuous Infusions: . sodium chloride 20 mL/hr at 06/03/17 0818  . piperacillin-tazobactam (ZOSYN)  IV Stopped (06/03/17 1219)  . vancomycin Stopped (06/03/17 1044)    Assessment/Plan:  1. Sepsis with staph aureus bacteremia: Continue therapy with IV vancomycin and Zosyn, TEE on Thursday, had PICC placed 2. Pneumonia: Continue IV antibiotics with Zosyn will be able to switch to Augmentin soon 3. Hypokalemia electrolytes being replaced magnesium still low 4. Anemia.  Due to chronic disease and colon cancer, monitor. 5. History of alcohol abuse on CIWA protocol- may be this is the reason for his confusion. 6. Type 2 diabetes on sliding scale. 7. Malignant neoplasm of sigmoid colon 8. Moderate protein calorie malnutrition 9. History of pancreatic insufficiency 10. Altered mental status- may be due to sepsis or due to alcohol withdrawal.  Currently  improved   Code Status:     Code Status Orders  (From admission, onward)        Start     Ordered   05/28/17 2204  Full code  Continuous     05/28/17 2203    Code Status History    Date Active Date Inactive Code Status Order ID Comments User Context   12/01/2016 02:45 12/04/2016 16:34 Full Code  [id number]  Lance Coon, MD Inpatient   05/12/2015 22:39 05/14/2015 17:23 Full Code [id number]  Lance Coon, MD Inpatient     Family Communication: Will call social worker consult today, due to family dinemics- Girl friend denying mother's authority on decision making. Disposition Plan: may be in 2-3 days.  Consultants:  General surgery  Critical care specialist  Procedures:  Port-A-Cath removal  Antibiotics:  IV vancomycin  Time spent: 35 minutes spent [place], [facility]

## 2017-06-03 NOTE — Progress Notes (Signed)
[facility] INFECTIOUS DISEASE PROGRESS NOTE Date of Admission:  05/28/2017     ID: [name] is a 56 y.o. 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 Wants to get oob  ROS  11 reviewed and neg except per hpi  Medications:  Antibiotics Given (last 72 hours)    Date/Time Action Medication Dose Rate   05/31/17 1555 New Bag/Given   vancomycin (VANCOCIN) IVPB 750 mg/150 ml premix 750 mg 150 mL/hr   06/01/17 0120 New Bag/Given  [patient pull iv out]   vancomycin (VANCOCIN) IVPB 750 mg/150 ml premix 750 mg 150 mL/hr   06/01/17 2243 New Bag/Given   vancomycin (VANCOCIN) IVPB 750 mg/150 ml premix 750 mg 150 mL/hr   06/02/17 1238 New Bag/Given   vancomycin (VANCOCIN) IVPB 750 mg/150 ml premix 750 mg 150 mL/hr   06/02/17 1737 New Bag/Given   piperacillin-tazobactam (ZOSYN) IVPB 3.375 g 3.375 g 12.5 mL/hr   06/02/17 2115 New Bag/Given   vancomycin (VANCOCIN) IVPB 750 mg/150 ml premix 750 mg 150 mL/hr   06/03/17 0010 New Bag/Given   piperacillin-tazobactam (ZOSYN) IVPB 3.375 g 3.375 g 12.5 mL/hr   06/03/17 0817 New Bag/Given   piperacillin-tazobactam (ZOSYN) IVPB 3.375 g 3.375 g 12.5 mL/hr   06/03/17 0944 New Bag/Given   vancomycin (VANCOCIN) IVPB 750 mg/150 ml premix 750 mg 150 mL/hr     . Chlorhexidine Gluconate Cloth  6 each Topical Q0600  . enoxaparin (LOVENOX) injection  40 mg Subcutaneous Q24H  . folic acid  1 mg Oral Daily  . guaiFENesin  600 mg Oral BID  . insulin aspart  0-9 Units Subcutaneous Q6H  . multivitamin with minerals  1 tablet Oral Daily  . mupirocin ointment  1 application Nasal BID  . pantoprazole (PROTONIX) IV  40 mg Intravenous Q24H  . potassium chloride  40 mEq Oral BID  . sodium chloride flush  10-40 mL Intracatheter Q12H  .  thiamine  100 mg Oral Daily   Or  . thiamine  100 mg Intravenous Daily    Objective: Vital signs in last 24 hours: Temp:  [97.9 F (36.6 C)-98.7 F (37.1 C)] 98.7 F (37.1 C) (12/05 0808) Pulse Rate:  [82-83] 83 (12/05 0808) Resp:  [16] 16 (12/05 0808) BP: (112-120)/(67-72) 120/72 (12/05 0808) SpO2:  [97 %-100 %] 100 % (12/05 7341) Constitutional: sitting up in bed, improved mentation  HENT: anicteric Mouth/Throat: oroarynx is  dry. No oropharyngeal exudate.  Cardiovascular: Normal rate, regular rhythm and normal heart sounds.  Pulmonary/Chest: Effort normal and breath sounds normal. No respiratory distress. He has no wheezes.  ANt chest wall PC site with healing wound Abdominal: Soft. Bowel sounds are normal. He exhibits no distension. There is no tenderness.  Lymphadenopathy: He has no cervical adenopathy.  Neurological: lethargic Skin: Skin is warm and dry. No rash noted. No erythema.  Psychiatric: improved mentation    Lab Results Recent Labs    06/02/17 0450 06/03/17 0620  WBC 17.5* 16.6*  HGB 9.9* 9.1*  HCT 30.0* 27.1*  NA 138 141  K 3.4* 3.9  CL 113* 113*  CO2 18* 20*  BUN <5* <5*  CREATININE 0.53* 0.65    Microbiology: Results for orders placed or performed during the hospital encounter of 05/28/17  Culture, blood (  Routine x 2)     Status: Abnormal   Collection Time: 05/28/17  6:21 PM  Result Value Ref Range Status   Specimen Description BLOOD BLOOD LEFT ARM  Final   Special Requests   Final    BOTTLES DRAWN AEROBIC AND ANAEROBIC Blood Culture adequate volume   Culture  Setup Time   Final    GRAM POSITIVE COCCI IN BOTH AEROBIC AND ANAEROBIC BOTTLES CRITICAL RESULT CALLED TO, READ BACK BY AND VERIFIED WITH: KRISTIN MERRILL AT 8182 05/29/17 SDR    Culture METHICILLIN RESISTANT STAPHYLOCOCCUS AUREUS (A)  Final   Report Status 05/31/2017 FINAL  Final   Organism ID, Bacteria METHICILLIN RESISTANT STAPHYLOCOCCUS AUREUS  Final      Susceptibility    Methicillin resistant staphylococcus aureus - MIC*    CIPROFLOXACIN >=8 RESISTANT Resistant     ERYTHROMYCIN >=8 RESISTANT Resistant     GENTAMICIN <=0.5 SENSITIVE Sensitive     OXACILLIN >=4 RESISTANT Resistant     TETRACYCLINE <=1 SENSITIVE Sensitive     VANCOMYCIN <=0.5 SENSITIVE Sensitive     TRIMETH/SULFA <=10 SENSITIVE Sensitive     CLINDAMYCIN RESISTANT Resistant     RIFAMPIN <=0.5 SENSITIVE Sensitive     Inducible Clindamycin POSITIVE Resistant     * METHICILLIN RESISTANT STAPHYLOCOCCUS AUREUS  Blood Culture ID Panel (Reflexed)     Status: Abnormal   Collection Time: 05/28/17  6:21 PM  Result Value Ref Range Status   Enterococcus species NOT DETECTED NOT DETECTED Final   Listeria monocytogenes NOT DETECTED NOT DETECTED Final   Staphylococcus species DETECTED (A) NOT DETECTED Final    Comment: CRITICAL RESULT CALLED TO, READ BACK BY AND VERIFIED WITH:  KRISTIN MERRILL AT 9937 05/29/17 SDR    Staphylococcus aureus DETECTED (A) NOT DETECTED Final    Comment: Methicillin (oxacillin)-resistant Staphylococcus aureus (MRSA). MRSA is predictably resistant to beta-lactam antibiotics (except ceftaroline). Preferred therapy is vancomycin unless clinically contraindicated. Patient requires contact precautions if  hospitalized. CRITICAL RESULT CALLED TO, READ BACK BY AND VERIFIED WITH:  KRISTIN MERRILL AT 1696 05/29/17 SDR    Methicillin resistance DETECTED (A) NOT DETECTED Final    Comment: CRITICAL RESULT CALLED TO, READ BACK BY AND VERIFIED WITH:  KRISTIN MERRILL AT 7893 05/29/17 SDR    Streptococcus species NOT DETECTED NOT DETECTED Final   Streptococcus agalactiae NOT DETECTED NOT DETECTED Final   Streptococcus pneumoniae NOT DETECTED NOT DETECTED Final   Streptococcus pyogenes NOT DETECTED NOT DETECTED Final   Acinetobacter baumannii NOT DETECTED NOT DETECTED Final   Enterobacteriaceae species NOT DETECTED NOT DETECTED Final   Enterobacter cloacae complex NOT DETECTED NOT  DETECTED Final   Escherichia coli NOT DETECTED NOT DETECTED Final   Klebsiella oxytoca NOT DETECTED NOT DETECTED Final   Klebsiella pneumoniae NOT DETECTED NOT DETECTED Final   Proteus species NOT DETECTED NOT DETECTED Final   Serratia marcescens NOT DETECTED NOT DETECTED Final   Haemophilus influenzae NOT DETECTED NOT DETECTED Final   Neisseria meningitidis NOT DETECTED NOT DETECTED Final   Pseudomonas aeruginosa NOT DETECTED NOT DETECTED Final   Candida albicans NOT DETECTED NOT DETECTED Final   Candida glabrata NOT DETECTED NOT DETECTED Final   Candida krusei NOT DETECTED NOT DETECTED Final   Candida parapsilosis NOT DETECTED NOT DETECTED Final   Candida tropicalis NOT DETECTED NOT DETECTED Final  Culture, blood (Routine x 2)     Status: Abnormal   Collection Time: 05/28/17  7:10 PM  Result Value Ref Range Status   Specimen Description BLOOD LEFT  ANTECUBITAL  Final   Special Requests   Final    BOTTLES DRAWN AEROBIC AND ANAEROBIC Blood Culture adequate volume   Culture  Setup Time   Final    GRAM POSITIVE COCCI IN BOTH AEROBIC AND ANAEROBIC BOTTLES CRITICAL VALUE NOTED.  VALUE IS CONSISTENT WITH PREVIOUSLY REPORTED AND CALLED VALUE.    Culture (A)  Final    STAPHYLOCOCCUS AUREUS SUSCEPTIBILITIES PERFORMED ON PREVIOUS CULTURE WITHIN THE LAST 5 DAYS. Performed at [facility], [place] [address] [place], [place] [address]    Report Status 05/31/2017 FINAL  Final  Surgical pcr screen     Status: Abnormal   Collection Time: 05/28/17 10:44 PM  Result Value Ref Range Status   MRSA, PCR POSITIVE (A) NEGATIVE Final    Comment: RESULT CALLED TO, READ BACK BY AND VERIFIED WITH: MAT PAGE ON 05/29/17 AT 0258 BY JAG    Staphylococcus aureus POSITIVE (A) NEGATIVE Final    Comment: RESULT CALLED TO, READ BACK BY AND VERIFIED WITH: MAT PAGE ON 05/29/17 AT 0213 BY JAG (NOTE) The Xpert SA Assay (FDA approved for NASAL specimens in patients 40 years of age and older), is one component  of a comprehensive surveillance program. It is not intended to diagnose infection nor to guide or monitor treatment.   Aerobic/Anaerobic Culture (surgical/deep wound)     Status: None (Preliminary result)   Collection Time: 05/29/17  1:31 PM  Result Value Ref Range Status   Specimen Description WOUND RIGHT UPPER CHEST  Final   Special Requests NONE  Final   Gram Stain   Final    FEW WBC PRESENT, PREDOMINANTLY PMN FEW GRAM POSITIVE COCCI IN CLUSTERS Performed at [facility], [place] [address], [place], [place] [address]    Culture   Final    FEW METHICILLIN RESISTANT STAPHYLOCOCCUS AUREUS NO ANAEROBES ISOLATED; CULTURE IN PROGRESS FOR 5 DAYS    Report Status PENDING  Incomplete   Organism ID, Bacteria METHICILLIN RESISTANT STAPHYLOCOCCUS AUREUS  Final      Susceptibility   Methicillin resistant staphylococcus aureus - MIC*    CIPROFLOXACIN >=8 RESISTANT Resistant     ERYTHROMYCIN >=8 RESISTANT Resistant     GENTAMICIN <=0.5 SENSITIVE Sensitive     OXACILLIN >=4 RESISTANT Resistant     TETRACYCLINE <=1 SENSITIVE Sensitive     VANCOMYCIN <=0.5 SENSITIVE Sensitive     TRIMETH/SULFA <=10 SENSITIVE Sensitive     CLINDAMYCIN RESISTANT Resistant     RIFAMPIN <=0.5 SENSITIVE Sensitive     Inducible Clindamycin POSITIVE Resistant     * FEW METHICILLIN RESISTANT STAPHYLOCOCCUS AUREUS  Culture, blood (single) w Reflex to ID Panel     Status: None   Collection Time: 05/29/17  6:06 PM  Result Value Ref Range Status   Specimen Description BLOOD LEFT ASSIST CONTROL  Final   Special Requests   Final    BOTTLES DRAWN AEROBIC AND ANAEROBIC Blood Culture adequate volume   Culture NO GROWTH 5 DAYS  Final   Report Status 06/03/2017 FINAL  Final  CULTURE, BLOOD (ROUTINE X 2) w Reflex to ID Panel     Status: None (Preliminary result)   Collection Time: 05/30/17 11:35 AM  Result Value Ref Range Status   Specimen Description BLOOD LEFT ASSIST CONTROL  Final   Special Requests   Final     BOTTLES DRAWN AEROBIC AND ANAEROBIC Blood Culture adequate volume   Culture NO GROWTH 4 DAYS  Final   Report Status PENDING  Incomplete  CULTURE, BLOOD (ROUTINE X 2) w Reflex to ID Panel     Status: None (Preliminary result)   Collection Time: 05/30/17 11:59 AM  Result Value Ref Range Status   Specimen Description BLOOD LEFT HAND  Final   Special Requests   Final    BOTTLES DRAWN AEROBIC AND ANAEROBIC Blood Culture adequate volume   Culture NO GROWTH 4 DAYS  Final   Report Status PENDING  Incomplete  CULTURE, BLOOD (ROUTINE X 2) w Reflex to ID Panel     Status: None (Preliminary result)   Collection Time: 05/31/17  5:01 AM  Result Value Ref Range Status   Specimen Description BLOOD LEFT ANTECUBITAL  Final   Special Requests   Final    BOTTLES DRAWN AEROBIC AND ANAEROBIC Blood Culture adequate volume   Culture NO GROWTH 3 DAYS  Final   Report Status PENDING  Incomplete  CULTURE, BLOOD (ROUTINE X 2) w Reflex to ID Panel     Status: None (Preliminary result)   Collection Time: 05/31/17  5:10 AM  Result Value Ref Range Status   Specimen Description BLOOD LEFT HAND  Final   Special Requests   Final    BOTTLES DRAWN AEROBIC AND ANAEROBIC Blood Culture adequate volume   Culture NO GROWTH 3 DAYS  Final   Report Status PENDING  Incomplete  CULTURE, BLOOD (ROUTINE X 2) w Reflex to ID Panel     Status: None (Preliminary result)   Collection Time: 06/01/17  4:50 AM  Result Value Ref Range Status   Specimen Description BLOOD LEFT AC  Final   Special Requests   Final    BOTTLES DRAWN AEROBIC AND ANAEROBIC Blood Culture adequate volume   Culture NO GROWTH 2 DAYS  Final   Report Status PENDING  Incomplete  CULTURE, BLOOD (ROUTINE X 2) w Reflex to ID Panel     Status: None (Preliminary result)   Collection Time: 06/01/17  4:57 AM  Result Value Ref Range Status   Specimen Description BLOOD LEFT HAND  Final   Special Requests   Final    BOTTLES DRAWN AEROBIC AND ANAEROBIC Blood Culture adequate  volume   Culture NO GROWTH 2 DAYS  Final   Report Status PENDING  Incomplete  CULTURE, BLOOD (ROUTINE X 2) w Reflex to ID Panel     Status: None (Preliminary result)   Collection Time: 06/02/17  4:50 AM  Result Value Ref Range Status   Specimen Description BLOOD BLOOD LEFT HAND  Final   Special Requests   Final    BOTTLES DRAWN AEROBIC AND ANAEROBIC Blood Culture adequate volume   Culture NO GROWTH 1 DAY  Final   Report Status PENDING  Incomplete  CULTURE, BLOOD (ROUTINE X 2) w Reflex to ID Panel     Status: None (Preliminary result)   Collection Time: 06/02/17  4:57 AM  Result Value Ref Range Status   Specimen Description BLOOD RIGHT ANTECUBITAL  Final   Special Requests   Final    BOTTLES DRAWN AEROBIC AND ANAEROBIC Blood Culture adequate volume   Culture NO GROWTH 1 DAY  Final   Report Status PENDING  Incomplete    Studies/Results: Study Conclusions ECHO 05/30/17 - Left ventricle: Systolic function was mildly reduced. The   estimated ejection fraction was in the range of 45% to 50%. - Aortic valve: There was trivial regurgitation. - Mitral valve: There was mild regurgitation. - Tricuspid valve: There was moderate regurgitation.  Assessment/Plan: [name] is a 56 y.o. male  with MRSA bacteremia from infected Portacath, now s/p removal on 11/30.  He had cath removed 11/30 and fu bcx neg. TTE neg but has mod TR.  No other signs of metastatic sites of infection. 12/4  Confusion improved. Some controversy over decision making on his behalf- girlfriend refused PICC line. Psych to see.   12/5 Temp stabilized, wbc down to 16. Picc placed. Much more alert.   Recommendations MRSA bacteremia from infected Portacath -Cath removed 11/30 and fu bcx neg Continue vancomycin. Tomorrow for TEE to rule out endocarditis.   If TEE negative can be dced home tomorrow on IV vanco and oral augmentin for the PNA -Will need 2 weeks min of IV Vancomycin  PNA - hypothermic, wbc up, cxr with  multifocal infiltrated. ? Aspiration during confusion yesterday. -  started zosyn in addition to vanco     Thank you very much for the consult. Will follow with you.  Leonel Ramsay   06/03/2017, 1:38 PM

## 2017-06-04 ENCOUNTER — Inpatient Hospital Stay
Admit: 2017-06-04 | Discharge: 2017-06-04 | Disposition: A | Discharge status: Home / Self Care | Payer: Medicaid Other | Attending: Internal Medicine | Admitting: Internal Medicine

## 2017-06-04 ENCOUNTER — Encounter
Admission: EM | Disposition: A | Discharge status: Home Health | Payer: Self-pay | Source: Home / Self Care | Attending: Internal Medicine

## 2017-06-04 HISTORY — PX: TEE WITHOUT CARDIOVERSION: SHX5443

## 2017-06-04 LAB — GLUCOSE, CAPILLARY
GLUCOSE-CAPILLARY: 96 mg/dL (ref 65–99)
GLUCOSE-CAPILLARY: 111 mg/dL — AB (ref 65–99)
GLUCOSE-CAPILLARY: 217 mg/dL — AB (ref 65–99)
Glucose-Capillary: 101 mg/dL — ABNORMAL HIGH (ref 65–99)
Glucose-Capillary: 150 mg/dL — ABNORMAL HIGH (ref 65–99)

## 2017-06-04 LAB — BASIC METABOLIC PANEL
Anion gap: 9 (ref 5–15)
BUN: <5 mg/dL — ABNORMAL LOW (ref 6–20)
CALCIUM: 8.3 mg/dL — AB (ref 8.9–10.3)
CHLORIDE: 105 mmol/L (ref 101–111)
CO2: 24 mmol/L (ref 22–32)
CREATININE: 0.7 mg/dL (ref 0.61–1.24)
GFR calc non Af Amer: >60 mL/min (ref >60)
GLUCOSE: 96 mg/dL (ref 65–99)
Potassium: 3.7 mmol/L (ref 3.5–5.1)
Sodium: 138 mmol/L (ref 135–145)

## 2017-06-04 LAB — CULTURE, BLOOD (ROUTINE X 2)
CULTURE: NO GROWTH
Culture: NO GROWTH
Special Requests: ADEQUATE
Special Requests: ADEQUATE

## 2017-06-04 LAB — CBC
HEMATOCRIT: 26.1 % — AB (ref 40.0–52.0)
HEMOGLOBIN: 8.7 g/dL — AB (ref 13.0–18.0)
MCH: 30.3 pg (ref 26.0–34.0)
MCHC: 33.3 g/dL (ref 32.0–36.0)
MCV: 90.9 fL (ref 80.0–100.0)
Platelets: 206 10*3/uL (ref 150–440)
RBC: 2.87 MIL/uL — ABNORMAL LOW (ref 4.40–5.90)
RDW: 14.8 % — AB (ref 11.5–14.5)
WBC: 23 10*3/uL — ABNORMAL HIGH (ref 3.8–10.6)

## 2017-06-04 LAB — MAGNESIUM: Magnesium: 1.6 mg/dL — ABNORMAL LOW (ref 1.7–2.4)

## 2017-06-04 SURGERY — ECHOCARDIOGRAM, TRANSESOPHAGEAL
Anesthesia: Moderate Sedation

## 2017-06-04 MED ORDER — FENTANYL CITRATE (PF) 100 MCG/2ML IJ SOLN
INTRAMUSCULAR | Status: AC
  Filled 2017-06-04: qty 2

## 2017-06-04 MED ORDER — ASPIRIN EC 81 MG PO TBEC: 81.0000 mg | DELAYED_RELEASE_TABLET | Freq: Every day | ORAL | Status: DC

## 2017-06-04 MED ORDER — MIDODRINE HCL 5 MG PO TABS: 5.0000 mg | ORAL_TABLET | Freq: Three times a day (TID) | ORAL | 0 refills | Status: DC

## 2017-06-04 MED ORDER — VANCOMYCIN HCL 500 MG IV SOLR
500.0000 mg | Freq: Once | INTRAVENOUS | Status: AC
  Administered 2017-06-04: 500 mg via INTRAVENOUS
  Filled 2017-06-04 (×3): qty 500

## 2017-06-04 MED ORDER — VANCOMYCIN HCL IN DEXTROSE 750-5 MG/150ML-% IV SOLN
750.0000 mg | Freq: Two times a day (BID) | INTRAVENOUS | Status: DC
  Filled 2017-06-04: qty 150

## 2017-06-04 MED ORDER — MIDAZOLAM HCL 5 MG/5ML IJ SOLN
INTRAMUSCULAR | Status: AC
Start: 2017-06-04 — End: 2017-06-04
  Filled 2017-06-04: qty 5

## 2017-06-04 MED ORDER — SODIUM CHLORIDE FLUSH 0.9 % IV SOLN
INTRAVENOUS | Status: AC
  Filled 2017-06-04: qty 10

## 2017-06-04 MED ORDER — VANCOMYCIN IV (FOR PTA / DISCHARGE USE ONLY): 750.0000 mg | Freq: Two times a day (BID) | INTRAVENOUS | 0 refills | Status: AC

## 2017-06-04 MED ORDER — MIDAZOLAM HCL 2 MG/2ML IJ SOLN
INTRAMUSCULAR | Status: AC | PRN
  Administered 2017-06-04 (×2): 1 mg via INTRAVENOUS

## 2017-06-04 MED ORDER — PANCRELIPASE (LIP-PROT-AMYL) 12000-38000 UNITS PO CPEP
36000.0000 [IU] | ORAL_CAPSULE | Freq: Three times a day (TID) | ORAL | Status: DC
  Filled 2017-06-04: qty 1

## 2017-06-04 MED ORDER — INSULIN ASPART 100 UNIT/ML ~~LOC~~ SOLN: 0.0000 [IU] | Freq: Every day | SUBCUTANEOUS | Status: DC

## 2017-06-04 MED ORDER — LIDOCAINE VISCOUS 2 % MT SOLN
OROMUCOSAL | Status: AC
  Administered 2017-06-04: 09:00:00
  Filled 2017-06-04: qty 15

## 2017-06-04 MED ORDER — BUTAMBEN-TETRACAINE-BENZOCAINE 2-2-14 % EX AERO
INHALATION_SPRAY | CUTANEOUS | Status: AC
  Administered 2017-06-04: 1
  Filled 2017-06-04: qty 5

## 2017-06-04 MED ORDER — MIDODRINE HCL 5 MG PO TABS
5.0000 mg | ORAL_TABLET | Freq: Three times a day (TID) | ORAL | Status: DC
  Administered 2017-06-04: 5 mg via ORAL
  Filled 2017-06-04: qty 1

## 2017-06-04 MED ORDER — MAGNESIUM SULFATE 2 GM/50ML IV SOLN
2.0000 g | Freq: Once | INTRAVENOUS | Status: AC
  Administered 2017-06-04: 2 g via INTRAVENOUS
  Filled 2017-06-04: qty 50

## 2017-06-04 MED ORDER — AMOXICILLIN-POT CLAVULANATE 875-125 MG PO TABS: 1.0000 | ORAL_TABLET | Freq: Two times a day (BID) | ORAL | 0 refills | Status: AC

## 2017-06-04 MED ORDER — INSULIN ASPART 100 UNIT/ML ~~LOC~~ SOLN
0.0000 [IU] | Freq: Three times a day (TID) | SUBCUTANEOUS | Status: DC
  Administered 2017-06-04: 3 [IU] via SUBCUTANEOUS
  Filled 2017-06-04: qty 1

## 2017-06-04 MED ORDER — VANCOMYCIN HCL IN DEXTROSE 750-5 MG/150ML-% IV SOLN: 750.0000 mg | Freq: Two times a day (BID) | INTRAVENOUS | Status: DC

## 2017-06-04 MED ORDER — AMITRIPTYLINE HCL 25 MG PO TABS: 25.0000 mg | ORAL_TABLET | Freq: Every day | ORAL | Status: DC

## 2017-06-04 MED ORDER — PANTOPRAZOLE SODIUM 40 MG PO TBEC
40.0000 mg | DELAYED_RELEASE_TABLET | Freq: Every day | ORAL | Status: DC
  Administered 2017-06-04: 40 mg via ORAL

## 2017-06-04 MED ORDER — FENTANYL CITRATE (PF) 100 MCG/2ML IJ SOLN
INTRAMUSCULAR | Status: AC | PRN
  Administered 2017-06-04 (×2): 25 ug via INTRAVENOUS

## 2017-06-04 NOTE — Progress Notes (Signed)
[facility] Encounter Note  Patient: [name] / Admit Date: 05/28/2017 / Date of Encounter: 06/04/2017, 1:04 PM   Subjective: Patient is slowly feeling better and no evidence of worsening concerns of bacteremia.  Patient has a PICC line without significant complication Transesophageal echocardiogram showing normal LV systolic function with ejection fraction of 50%.  There is no evidence of valvular vegetation or endocarditis.  There is minimal mitral and tricuspid insufficiency and trivial aortic insufficiency.  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 rate 14, height 5\' 8"  (1.727 m), weight 59 kg (130 lb 1.1 oz), SpO2 96 %. Body mass index is 19.78 kg/m. General: Well developed, well nourished, in no acute distress. Head: Normocephalic, atraumatic, sclera non-icteric, no xanthomas, nares are without discharge. Neck: No apparent masses Lungs: Normal respirations with no wheezes, no rhonchi, no rales , no crackles   Heart: Regular rate and rhythm, normal S1 S2, no murmur, no rub, no gallop, PMI is normal size and placement, carotid upstroke normal without bruit, jugular venous pressure normal Abdomen: Soft, non-tender, non-distended with normoactive bowel sounds. No hepatosplenomegaly. Abdominal aorta is normal size without bruit Extremities: No edema, no clubbing, no cyanosis, no ulcers,  Peripheral: 2+ radial, 2+ femoral, 2+ dorsal pedal pulses Neuro: Alert and oriented. Moves all extremities  spontaneously. Psych:  Responds to questions appropriately with a normal affect.   Intake/Output Summary (Last 24 hours) at 06/04/2017 1304 Last data filed at 06/04/2017 1013 Gross per 24 hour  Intake 365 ml  Output 1650 ml  Net -1285 ml    Inpatient Medications:  . amitriptyline  25 mg Oral QHS  . aspirin EC  81 mg Oral Daily  . enoxaparin (LOVENOX) injection  40 mg Subcutaneous Q24H  . fentaNYL      . folic acid  1 mg Oral Daily  . guaiFENesin  600 mg Oral BID  . insulin aspart  0-5 Units Subcutaneous QHS  . insulin aspart  0-9 Units Subcutaneous TID WC  . lipase/protease/amylase  36,000 Units Oral TID AC  . midazolam      . midodrine  5 mg Oral TID WC  . multivitamin with minerals  1 tablet Oral Daily  . mupirocin ointment  1 application Nasal BID  . pantoprazole  40 mg Oral QAC breakfast  . potassium chloride  40 mEq Oral BID  . sodium chloride flush  10-40 mL Intracatheter Q12H  . sodium chloride flush      . thiamine  100 mg Oral Daily   Infusions:  . sodium chloride 20 mL/hr at 06/03/17 2133  . piperacillin-tazobactam (ZOSYN)  IV Stopped (06/04/17 0551)  . vancomycin 750 mg (06/03/17 2140)    Labs: Recent Labs    06/02/17 0450 06/03/17 0620 06/04/17 0507  NA 138 141 138  K 3.4* 3.9 3.7  CL 113* 113* 105  CO2 18* 20* 24  GLUCOSE 96 102* 96  BUN <5* <5* <5*  CREATININE 0.53* 0.65 0.70  CALCIUM 8.0* 8.1* 8.3*  MG 1.7 1.6* 1.6*  PHOS 3.0 4.2  --    No results for input(s): AST, ALT, ALKPHOS, BILITOT, PROT, ALBUMIN in the last 72  hours. Recent Labs    06/03/17 0620 06/04/17 0507  WBC 16.6* 23.0*  HGB 9.1* 8.7*  HCT 27.1* 26.1*  MCV 91.7 90.9  PLT 155 206   No results for input(s): CKTOTAL, CKMB, TROPONINI in the last 72 hours. Invalid input(s): POCBNP No results for input(s): HGBA1C in the last 72 hours.   Weights: Filed Weights   05/28/17 1720 05/29/17 1630 05/30/17 1000  Weight: 47.6 kg (105 lb) 56 kg (123 lb 7.3 oz) 59 kg (130 lb 1.1 oz)      Radiology/Studies:  Dg Chest 1 View  Result Date: 06/02/2017 CLINICAL DATA:  Hypothermia EXAM: CHEST 1 VIEW COMPARISON:  May 28, 2017 FINDINGS: There is a small left pleural effusion. There is patchy airspace consolidation in both mid and lower lung zones. Consolidation is most notable in the medial left base. Heart size and pulmonary vascularity are normal. No adenopathy. There is evidence of old rib trauma on the left. IMPRESSION: Patchy airspace opacity bilaterally, most notably in the lower lung zones, slightly more severe on the left than on the right. The appearance felt to represent multifocal pneumonia. Small left pleural effusion. Heart size within normal limits. No evident adenopathy. Electronically Signed   By: Lowella Grip III M.D.   On: 06/02/2017 10:47   Ct Head Wo Contrast  Result Date: 05/28/2017 CLINICAL DATA:  Initial evaluation for acute altered mental status, increased confusion. History of prostate cancer. EXAM: CT HEAD WITHOUT CONTRAST TECHNIQUE: Contiguous axial images were obtained from the base of the skull through the vertex without intravenous contrast. COMPARISON:  Prior CT from 11/30/2016. FINDINGS: Brain: Stable atrophy with chronic small vessel ischemic disease. No acute intracranial hemorrhage. No acute large vessel territory infarct. No mass lesion, midline shift or mass effect. No hydrocephalus. No extra-axial fluid collection. Vascular: No hyperdense vessel. Scattered vascular calcifications noted within the carotid siphons. Skull: Scalp soft tissues and calvarium within normal limits. Sinuses/Orbits: Globes and orbital soft tissues normal. Paranasal sinuses and mastoid air cells are clear. Other: None. IMPRESSION: 1. No acute intracranial abnormality. 2. Stable atrophy with chronic small vessel ischemic disease. Electronically Signed   By: Jeannine Boga M.D.   On: 05/28/2017 18:21   Dg Chest Portable 1 View  Result Date: 05/28/2017 CLINICAL  DATA:  Altered mental status. Prostate cancer and pancreatitis. EXAM: PORTABLE CHEST 1 VIEW COMPARISON:  05/25/2017 FINDINGS: Right Port-A-Cath terminates at the mid SVC. Midline trachea. Normal heart size and mediastinal contours. No pleural effusion or pneumothorax. Remote bilateral rib fractures. IMPRESSION: No acute cardiopulmonary disease. Electronically Signed   By: Abigail Miyamoto M.D.   On: 05/28/2017 17:39   Dg Chest Portable 1 View  Result Date: 05/25/2017 CLINICAL DATA:  Pt comes into the ED via POV c/o hyperglycemia. Patient's cancer Dr. called him and informed him that his sugar was over 600. Patient is diabetic and has not started his colon cancer treatments at this time. EXAM: PORTABLE CHEST 1 VIEW COMPARISON:  05/18/2017 FINDINGS: Patient is a right-sided Port-A-Cath, tip overlying the level of the superior vena cava. Heart size is normal. Lungs are free of focal consolidations and pleural effusions. No pulmonary edema. IMPRESSION: No evidence for acute cardiopulmonary abnormality. Electronically Signed   By: Nolon Nations M.D.   On: 05/25/2017 13:34   Dg Chest Port 1 View  Result Date: 05/18/2017 CLINICAL DATA:  Central line placement EXAM: PORTABLE CHEST 1 VIEW COMPARISON:  12/27/2016 FINDINGS: Right Port-A-Cath placement with the tip in the SVC. No pneumothorax. Heart is  normal size. Lungs are clear. Multiple old healed left rib fractures. No effusions. IMPRESSION: Right Port-A-Cath placement with the tip in the SVC. No pneumothorax. No active disease. Electronically Signed   By: Rolm Baptise M.D.   On: 05/18/2017 09:58   Dg C-arm 1-60 Min-no Report  Result Date: 05/18/2017 Fluoroscopy was utilized by the requesting physician.  No radiographic interpretation.     Assessment and Recommendation  56 y.o. male with known significant recent sepsis and bacteremia due to Port-A-Cath infection now resolved with negative blood cultures and no current evidence of endocarditis by  transesophageal echocardiogram 1.  No further cardiac intervention at this time due to no evidence of valvular heart disease and/or endocarditis .  Call if further questions Signed, Serafina Royals M.D. FACC

## 2017-06-04 NOTE — Discharge Instructions (Signed)
[facility] [place] [facility] ° °DIET:  °Cardiac diet ° °DISCHARGE CONDITION:  °Stable ° °ACTIVITY:  °Activity as tolerated ° °OXYGEN:  °Home Oxygen: No. °  °Oxygen Delivery: room air ° °DISCHARGE LOCATION:  °home  ° ° °ADDITIONAL DISCHARGE INSTRUCTION: ° ° °If you experience worsening of your admission symptoms, develop shortness of breath, life threatening emergency, suicidal or homicidal thoughts you must seek medical attention immediately by calling 911 or calling your MD immediately  if symptoms less severe. ° °You Must read complete instructions/literature along with all the possible adverse reactions/side effects for all the Medicines you take and that have been prescribed to you. Take any new Medicines after you have completely understood and accpet all the possible adverse reactions/side effects.  ° °Please note ° °You were cared for by a hospitalist during your hospital stay. If you have any questions about your discharge medications or the care you received while you were in the hospital after you are discharged, you can call the unit and asked to speak with the hospitalist on call if the hospitalist that took care of you is not available. Once you are discharged, your primary care physician will handle any further medical issues. Please note that NO REFILLS for any discharge medications will be authorized once you are discharged, as it is imperative that you return to your primary care physician (or establish a relationship with a primary care physician if you do not have one) for your aftercare needs so that they can reassess your need for medications and monitor your lab values. ° ° °

## 2017-06-04 NOTE — Discharge Summary (Signed)
[place] at [facility], 56 y.o., DOB Jan 19, 1961, MRN [id number]. Admission date: 05/28/2017 Discharge Date 06/04/2017 Primary MD [name], MD Admitting Physician Lance Coon, MD  Admission Diagnosis  Acute encephalopathy [G93.40] Sepsis, due to unspecified organism [facility]) [A41.9]  Discharge Diagnosis   Principal Problem: Sepsis with staph aureus bacteremia related to vascular access Pneumonia possibly due to aspiration Hypokalemia Anemia History of alcohol abuse Type 2 diabetes Malignant neoplasm of the sigmoid colon Moderate protein caloric malnutrition History of pancreatic insufficiency Acute encephalopathy      Hospital Course Sarkis L Gwynnis a 56 y.o.malewith MRSA bacteremia from infected Portacath.  Patient was admitted and had his Port-A-Cath removed on November 30.  Patient was seen by infectious disease.  He was placed on IV antibiotics.  Subsequently also noticed to have possible aspiration pneumonia which was treated with IV antibiotics.  Patient's WBC count is still elevated however he is doing much better very anxious to go home.  His blood pressure was low borderline today I try to explain him that he needed to stay however he stated that he wanted to go home.  At this point home IV antibiotics have been arranged by infectious disease.  He will need to follow-up outpatient with infectious disease and his primary care provider.  Once he is stable from his infection he will need to follow-up with his oncologist. Patient underwent a TEE which was negative for endocarditis.                 Consults  ID, cardiology  Significant Tests:  See full reports for all details    Dg Chest 1 View  Result Date: 06/02/2017 CLINICAL DATA:  Hypothermia EXAM: CHEST 1 VIEW COMPARISON:  May 28, 2017 FINDINGS: There is a small left pleural effusion. There is patchy airspace consolidation in both mid and lower lung  zones. Consolidation is most notable in the medial left base. Heart size and pulmonary vascularity are normal. No adenopathy. There is evidence of old rib trauma on the left. IMPRESSION: Patchy airspace opacity bilaterally, most notably in the lower lung zones, slightly more severe on the left than on the right. The appearance felt to represent multifocal pneumonia. Small left pleural effusion. Heart size within normal limits. No evident adenopathy. Electronically Signed   By: Lowella Grip III M.D.   On: 06/02/2017 10:47   Ct Head Wo Contrast  Result Date: 05/28/2017 CLINICAL DATA:  Initial evaluation for acute altered mental status, increased confusion. History of prostate cancer. EXAM: CT HEAD WITHOUT CONTRAST TECHNIQUE: Contiguous axial images were obtained from the base of the skull through the vertex without intravenous contrast. COMPARISON:  Prior CT from 11/30/2016. FINDINGS: Brain: Stable atrophy with chronic small vessel ischemic disease. No acute intracranial hemorrhage. No acute large vessel territory infarct. No mass lesion, midline shift or mass effect. No hydrocephalus. No extra-axial fluid collection. Vascular: No hyperdense vessel. Scattered vascular calcifications noted within the carotid siphons. Skull: Scalp soft tissues and calvarium within normal limits. Sinuses/Orbits: Globes and orbital soft tissues normal. Paranasal sinuses and mastoid air cells are clear. Other: None. IMPRESSION: 1. No acute intracranial abnormality. 2. Stable atrophy with chronic small vessel ischemic disease. Electronically Signed   By: Jeannine Boga M.D.   On: 05/28/2017 18:21   Dg Chest Portable 1 View  Result Date: 05/28/2017 CLINICAL DATA:  Altered mental status. Prostate cancer and pancreatitis. EXAM: PORTABLE CHEST 1 VIEW COMPARISON:  05/25/2017 FINDINGS: Right Port-A-Cath terminates  at the mid SVC. Midline trachea. Normal heart size and mediastinal contours. No pleural effusion or pneumothorax.  Remote bilateral rib fractures. IMPRESSION: No acute cardiopulmonary disease. Electronically Signed   By: Abigail Miyamoto M.D.   On: 05/28/2017 17:39   Dg Chest Portable 1 View  Result Date: 05/25/2017 CLINICAL DATA:  Pt comes into the ED via POV c/o hyperglycemia. Patient's cancer Dr. called him and informed him that his sugar was over 600. Patient is diabetic and has not started his colon cancer treatments at this time. EXAM: PORTABLE CHEST 1 VIEW COMPARISON:  05/18/2017 FINDINGS: Patient is a right-sided Port-A-Cath, tip overlying the level of the superior vena cava. Heart size is normal. Lungs are free of focal consolidations and pleural effusions. No pulmonary edema. IMPRESSION: No evidence for acute cardiopulmonary abnormality. Electronically Signed   By: Nolon Nations M.D.   On: 05/25/2017 13:34   Dg Chest Port 1 View  Result Date: 05/18/2017 CLINICAL DATA:  Central line placement EXAM: PORTABLE CHEST 1 VIEW COMPARISON:  12/27/2016 FINDINGS: Right Port-A-Cath placement with the tip in the SVC. No pneumothorax. Heart is normal size. Lungs are clear. Multiple old healed left rib fractures. No effusions. IMPRESSION: Right Port-A-Cath placement with the tip in the SVC. No pneumothorax. No active disease. Electronically Signed   By: Rolm Baptise M.D.   On: 05/18/2017 09:58   Dg C-arm 1-60 Min-no Report  Result Date: 05/18/2017 Fluoroscopy was utilized by the requesting physician.  No radiographic interpretation.       Today   Subjective:   [place] patient feels well denies any complaints wants to go home Objective:   Blood pressure 90/62, pulse 85, temperature 99.3 F (37.4 C), temperature source Oral, resp. rate 14, height 5\' 8"  (1.727 m), weight 130 lb 1.1 oz (59 kg), SpO2 96 %.  .  Intake/Output Summary (Last 24 hours) at 06/04/2017 1751 Last data filed at 06/04/2017 1013 Gross per 24 hour  Intake 181 ml  Output 1650 ml  Net -1469 ml    Exam VITAL SIGNS: Blood pressure  90/62, pulse 85, temperature 99.3 F (37.4 C), temperature source Oral, resp. rate 14, height 5\' 8"  (1.727 m), weight 130 lb 1.1 oz (59 kg), SpO2 96 %.  GENERAL:  56 y.o.-year-old patient lying in the bed with no acute distress.  EYES: Pupils equal, round, reactive to light and accommodation. No scleral icterus. Extraocular muscles intact.  HEENT: Head atraumatic, normocephalic. Oropharynx and nasopharynx clear.  NECK:  Supple, no jugular venous distention. No thyroid enlargement, no tenderness.  LUNGS: Normal breath sounds bilaterally, no wheezing, rales,rhonchi or crepitation. No use of accessory muscles of respiration.  CARDIOVASCULAR: S1, S2 normal. No murmurs, rubs, or gallops.  ABDOMEN: Soft, nontender, nondistended. Bowel sounds present. No organomegaly or mass.  EXTREMITIES: No pedal edema, cyanosis, or clubbing.  NEUROLOGIC: Cranial nerves II through XII are intact. Muscle strength 5/5 in all extremities. Sensation intact. Gait not checked.  PSYCHIATRIC: The patient is alert and oriented x 3.  SKIN: No obvious rash, lesion, or ulcer.   Data Review     CBC w Diff:  Lab Results  Component Value Date   WBC 23.0 (H) 06/04/2017   HGB 8.7 (L) 06/04/2017   HGB 13.6 07/20/2014   HCT 26.1 (L) 06/04/2017   HCT 41.4 07/20/2014   PLT 206 06/04/2017   PLT 250 07/20/2014   LYMPHOPCT 4 05/28/2017   LYMPHOPCT 10.0 07/20/2014   MONOPCT 8 05/28/2017   MONOPCT 8.4 07/20/2014  EOSPCT 0 05/28/2017   EOSPCT 0.2 07/20/2014   BASOPCT 0 05/28/2017   BASOPCT 0.8 07/20/2014   CMP:  Lab Results  Component Value Date   NA 138 06/04/2017   NA 131 (L) 07/20/2014   K 3.7 06/04/2017   K 3.0 (L) 07/20/2014   CL 105 06/04/2017   CL 95 (L) 07/20/2014   CO2 24 06/04/2017   CO2 23 07/20/2014   BUN <5 (L) 06/04/2017   BUN 6 (L) 07/20/2014   CREATININE 0.70 06/04/2017   CREATININE 0.80 07/20/2014   PROT 5.7 (L) 05/29/2017   PROT 7.9 07/20/2014   ALBUMIN 2.2 (L) 05/29/2017   ALBUMIN 2.9 (L)  07/20/2014   BILITOT 0.5 05/29/2017   BILITOT 0.4 07/20/2014   ALKPHOS 154 (H) 05/29/2017   ALKPHOS 82 07/20/2014   AST 31 05/29/2017   AST 20 07/20/2014   ALT 20 05/29/2017   ALT 20 07/20/2014  .  Micro Results Recent Results (from the past 240 hour(s))  Culture, blood (Routine x 2)     Status: Abnormal   Collection Time: 05/28/17  6:21 PM  Result Value Ref Range Status   Specimen Description BLOOD BLOOD LEFT ARM  Final   Special Requests   Final    BOTTLES DRAWN AEROBIC AND ANAEROBIC Blood Culture adequate volume   Culture  Setup Time   Final    GRAM POSITIVE COCCI IN BOTH AEROBIC AND ANAEROBIC BOTTLES CRITICAL RESULT CALLED TO, READ BACK BY AND VERIFIED WITH: KRISTIN MERRILL AT 4034 05/29/17 SDR    Culture METHICILLIN RESISTANT STAPHYLOCOCCUS AUREUS (A)  Final   Report Status 05/31/2017 FINAL  Final   Organism ID, Bacteria METHICILLIN RESISTANT STAPHYLOCOCCUS AUREUS  Final      Susceptibility   Methicillin resistant staphylococcus aureus - MIC*    CIPROFLOXACIN >=8 RESISTANT Resistant     ERYTHROMYCIN >=8 RESISTANT Resistant     GENTAMICIN <=0.5 SENSITIVE Sensitive     OXACILLIN >=4 RESISTANT Resistant     TETRACYCLINE <=1 SENSITIVE Sensitive     VANCOMYCIN <=0.5 SENSITIVE Sensitive     TRIMETH/SULFA <=10 SENSITIVE Sensitive     CLINDAMYCIN RESISTANT Resistant     RIFAMPIN <=0.5 SENSITIVE Sensitive     Inducible Clindamycin POSITIVE Resistant     * METHICILLIN RESISTANT STAPHYLOCOCCUS AUREUS  Blood Culture ID Panel (Reflexed)     Status: Abnormal   Collection Time: 05/28/17  6:21 PM  Result Value Ref Range Status   Enterococcus species NOT DETECTED NOT DETECTED Final   Listeria monocytogenes NOT DETECTED NOT DETECTED Final   Staphylococcus species DETECTED (A) NOT DETECTED Final    Comment: CRITICAL RESULT CALLED TO, READ BACK BY AND VERIFIED WITH:  KRISTIN MERRILL AT 7425 05/29/17 SDR    Staphylococcus aureus DETECTED (A) NOT DETECTED Final    Comment:  Methicillin (oxacillin)-resistant Staphylococcus aureus (MRSA). MRSA is predictably resistant to beta-lactam antibiotics (except ceftaroline). Preferred therapy is vancomycin unless clinically contraindicated. Patient requires contact precautions if  hospitalized. CRITICAL RESULT CALLED TO, READ BACK BY AND VERIFIED WITH:  KRISTIN MERRILL AT 9563 05/29/17 SDR    Methicillin resistance DETECTED (A) NOT DETECTED Final    Comment: CRITICAL RESULT CALLED TO, READ BACK BY AND VERIFIED WITH:  KRISTIN MERRILL AT 8756 05/29/17 SDR    Streptococcus species NOT DETECTED NOT DETECTED Final   Streptococcus agalactiae NOT DETECTED NOT DETECTED Final   Streptococcus pneumoniae NOT DETECTED NOT DETECTED Final   Streptococcus pyogenes NOT DETECTED NOT DETECTED Final   Acinetobacter baumannii NOT  DETECTED NOT DETECTED Final   Enterobacteriaceae species NOT DETECTED NOT DETECTED Final   Enterobacter cloacae complex NOT DETECTED NOT DETECTED Final   Escherichia coli NOT DETECTED NOT DETECTED Final   Klebsiella oxytoca NOT DETECTED NOT DETECTED Final   Klebsiella pneumoniae NOT DETECTED NOT DETECTED Final   Proteus species NOT DETECTED NOT DETECTED Final   Serratia marcescens NOT DETECTED NOT DETECTED Final   Haemophilus influenzae NOT DETECTED NOT DETECTED Final   Neisseria meningitidis NOT DETECTED NOT DETECTED Final   Pseudomonas aeruginosa NOT DETECTED NOT DETECTED Final   Candida albicans NOT DETECTED NOT DETECTED Final   Candida glabrata NOT DETECTED NOT DETECTED Final   Candida krusei NOT DETECTED NOT DETECTED Final   Candida parapsilosis NOT DETECTED NOT DETECTED Final   Candida tropicalis NOT DETECTED NOT DETECTED Final  Culture, blood (Routine x 2)     Status: Abnormal   Collection Time: 05/28/17  7:10 PM  Result Value Ref Range Status   Specimen Description BLOOD LEFT ANTECUBITAL  Final   Special Requests   Final    BOTTLES DRAWN AEROBIC AND ANAEROBIC Blood Culture adequate volume   Culture   Setup Time   Final    GRAM POSITIVE COCCI IN BOTH AEROBIC AND ANAEROBIC BOTTLES CRITICAL VALUE NOTED.  VALUE IS CONSISTENT WITH PREVIOUSLY REPORTED AND CALLED VALUE.    Culture (A)  Final    STAPHYLOCOCCUS AUREUS SUSCEPTIBILITIES PERFORMED ON PREVIOUS CULTURE WITHIN THE LAST 5 DAYS. Performed at [facility], [place] [address], [place], [place] [address]    Report Status 05/31/2017 FINAL  Final  Surgical pcr screen     Status: Abnormal   Collection Time: 05/28/17 10:44 PM  Result Value Ref Range Status   MRSA, PCR POSITIVE (A) NEGATIVE Final    Comment: RESULT CALLED TO, READ BACK BY AND VERIFIED WITH: MAT PAGE ON 05/29/17 AT 9528 BY JAG    Staphylococcus aureus POSITIVE (A) NEGATIVE Final    Comment: RESULT CALLED TO, READ BACK BY AND VERIFIED WITH: MAT PAGE ON 05/29/17 AT 0213 BY JAG (NOTE) The Xpert SA Assay (FDA approved for NASAL specimens in patients 60 years of age and older), is one component of a comprehensive surveillance program. It is not intended to diagnose infection nor to guide or monitor treatment.   Aerobic/Anaerobic Culture (surgical/deep wound)     Status: None   Collection Time: 05/29/17  1:31 PM  Result Value Ref Range Status   Specimen Description WOUND RIGHT UPPER CHEST  Final   Special Requests NONE  Final   Gram Stain   Final    FEW WBC PRESENT, PREDOMINANTLY PMN FEW GRAM POSITIVE COCCI IN CLUSTERS    Culture   Final    FEW METHICILLIN RESISTANT STAPHYLOCOCCUS AUREUS NO ANAEROBES ISOLATED Performed at [facility], [place] [address] [place], [place] [address]    Report Status 06/03/2017 FINAL  Final   Organism ID, Bacteria METHICILLIN RESISTANT STAPHYLOCOCCUS AUREUS  Final      Susceptibility   Methicillin resistant staphylococcus aureus - MIC*    CIPROFLOXACIN >=8 RESISTANT Resistant     ERYTHROMYCIN >=8 RESISTANT Resistant     GENTAMICIN <=0.5 SENSITIVE Sensitive     OXACILLIN >=4 RESISTANT Resistant     TETRACYCLINE <=1 SENSITIVE  Sensitive     VANCOMYCIN <=0.5 SENSITIVE Sensitive     TRIMETH/SULFA <=10 SENSITIVE Sensitive     CLINDAMYCIN RESISTANT Resistant     RIFAMPIN <=0.5 SENSITIVE Sensitive     Inducible Clindamycin  POSITIVE Resistant     * FEW METHICILLIN RESISTANT STAPHYLOCOCCUS AUREUS  Culture, blood (single) w Reflex to ID Panel     Status: None   Collection Time: 05/29/17  6:06 PM  Result Value Ref Range Status   Specimen Description BLOOD LEFT ASSIST CONTROL  Final   Special Requests   Final    BOTTLES DRAWN AEROBIC AND ANAEROBIC Blood Culture adequate volume   Culture NO GROWTH 5 DAYS  Final   Report Status 06/03/2017 FINAL  Final  CULTURE, BLOOD (ROUTINE X 2) w Reflex to ID Panel     Status: None   Collection Time: 05/30/17 11:35 AM  Result Value Ref Range Status   Specimen Description BLOOD LEFT ASSIST CONTROL  Final   Special Requests   Final    BOTTLES DRAWN AEROBIC AND ANAEROBIC Blood Culture adequate volume   Culture NO GROWTH 5 DAYS  Final   Report Status 06/04/2017 FINAL  Final  CULTURE, BLOOD (ROUTINE X 2) w Reflex to ID Panel     Status: None   Collection Time: 05/30/17 11:59 AM  Result Value Ref Range Status   Specimen Description BLOOD LEFT HAND  Final   Special Requests   Final    BOTTLES DRAWN AEROBIC AND ANAEROBIC Blood Culture adequate volume   Culture NO GROWTH 5 DAYS  Final   Report Status 06/04/2017 FINAL  Final  CULTURE, BLOOD (ROUTINE X 2) w Reflex to ID Panel     Status: None (Preliminary result)   Collection Time: 05/31/17  5:01 AM  Result Value Ref Range Status   Specimen Description BLOOD LEFT ANTECUBITAL  Final   Special Requests   Final    BOTTLES DRAWN AEROBIC AND ANAEROBIC Blood Culture adequate volume   Culture NO GROWTH 4 DAYS  Final   Report Status PENDING  Incomplete  CULTURE, BLOOD (ROUTINE X 2) w Reflex to ID Panel     Status: None (Preliminary result)   Collection Time: 05/31/17  5:10 AM  Result Value Ref Range Status   Specimen Description BLOOD LEFT  HAND  Final   Special Requests   Final    BOTTLES DRAWN AEROBIC AND ANAEROBIC Blood Culture adequate volume   Culture NO GROWTH 4 DAYS  Final   Report Status PENDING  Incomplete  CULTURE, BLOOD (ROUTINE X 2) w Reflex to ID Panel     Status: None (Preliminary result)   Collection Time: 06/01/17  4:50 AM  Result Value Ref Range Status   Specimen Description BLOOD LEFT AC  Final   Special Requests   Final    BOTTLES DRAWN AEROBIC AND ANAEROBIC Blood Culture adequate volume   Culture NO GROWTH 3 DAYS  Final   Report Status PENDING  Incomplete  CULTURE, BLOOD (ROUTINE X 2) w Reflex to ID Panel     Status: None (Preliminary result)   Collection Time: 06/01/17  4:57 AM  Result Value Ref Range Status   Specimen Description BLOOD LEFT HAND  Final   Special Requests   Final    BOTTLES DRAWN AEROBIC AND ANAEROBIC Blood Culture adequate volume   Culture NO GROWTH 3 DAYS  Final   Report Status PENDING  Incomplete  CULTURE, BLOOD (ROUTINE X 2) w Reflex to ID Panel     Status: None (Preliminary result)   Collection Time: 06/02/17  4:50 AM  Result Value Ref Range Status   Specimen Description BLOOD BLOOD LEFT HAND  Final   Special Requests   Final  BOTTLES DRAWN AEROBIC AND ANAEROBIC Blood Culture adequate volume   Culture NO GROWTH 2 DAYS  Final   Report Status PENDING  Incomplete  CULTURE, BLOOD (ROUTINE X 2) w Reflex to ID Panel     Status: None (Preliminary result)   Collection Time: 06/02/17  4:57 AM  Result Value Ref Range Status   Specimen Description BLOOD RIGHT ANTECUBITAL  Final   Special Requests   Final    BOTTLES DRAWN AEROBIC AND ANAEROBIC Blood Culture adequate volume   Culture NO GROWTH 2 DAYS  Final   Report Status PENDING  Incomplete        Code Status Orders  (From admission, onward)        Start     Ordered   05/28/17 2204  Full code  Continuous     05/28/17 2203    Code Status History    Date Active Date Inactive Code Status Order ID Comments User Context    12/01/2016 02:45 12/04/2016 16:34 Full Code [id number]  Lance Coon, MD Inpatient   05/12/2015 22:39 05/14/2015 17:23 Full Code [id number]  Lance Coon, MD Inpatient          Follow-up Information    [name], MD Follow up on 06/08/2017.   Specialty:  Internal Medicine Why:  @ 1:00 pm Contact information: [place] [address] [phone]        Leonel Ramsay, MD. Go on 06/12/2017.   Specialty:  Infectious Diseases Why:  at 8:30 am Contact information: [place] [address] [phone]           Discharge Medications   Allergies as of 06/04/2017      Reactions   Aspirin Itching, Other (See Comments)   Reaction: abdominal pain      Medication List    STOP taking these medications   cephALEXin 500 MG capsule Commonly known as:  KEFLEX     TAKE these medications   amitriptyline 25 MG tablet Commonly known as:  ELAVIL Take 25 mg by mouth.   amoxicillin-clavulanate 875-125 MG tablet Commonly known as:  AUGMENTIN Take 1 tablet by mouth 2 (two) times daily for 7 days.   aspirin EC 81 MG tablet Take 81 mg by mouth.   B-D UF III MINI PEN NEEDLES 31G X 5 MM Misc Generic drug:  Insulin Pen Needle   cyclobenzaprine 10 MG tablet Commonly known as:  FLEXERIL Take 10 mg by mouth 2 (two) times daily.   diphenoxylate-atropine 2.5-0.025 MG tablet Commonly known as:  LOMOTIL take 1 tablet four times a day if needed   GLUCOCOM BLOOD GLUCOSE MONITOR Devi use as directed   LANTUS SOLOSTAR 100 UNIT/ML Solostar Pen Generic drug:  Insulin Glargine Inject 10 Units into the skin 2 (two) times daily. What changed:  when to take this   lidocaine-prilocaine cream Commonly known as:  EMLA Apply over porta cath 1-2 hours prior to chemo.   lipase/protease/amylase 36000 UNITS Cpep capsule Commonly known as:  CREON Take 1 capsule (36,000 Units total) 3 (three) times daily before meals by mouth.   midodrine  5 MG tablet Commonly known as:  PROAMATINE Take 1 tablet (5 mg total) by mouth 3 (three) times daily with meals.   NOVOLOG FLEXPEN 100 UNIT/ML FlexPen Generic drug:  insulin aspart Inject 48 Units into the skin 3 (three) times daily with meals.   ondansetron 8 MG tablet Commonly known as:  ZOFRAN Take 1 tablet (8 mg total)  by mouth every 8 (eight) hours as needed for nausea or vomiting.   Oxycodone HCl 10 MG Tabs Take 1 tablet (10 mg total) every 6 (six) hours as needed by mouth.   prochlorperazine 10 MG tablet Commonly known as:  COMPAZINE Take 1 tablet (10 mg total) by mouth every 6 (six) hours as needed for nausea or vomiting.   RA ACETAMINOPHEN 325 MG tablet Generic drug:  acetaminophen take 2 tablets by mouth every 6 hours   traZODone 50 MG tablet Commonly known as:  DESYREL take 1 tablet by mouth at bedtime TO HELP WITH SLEEP-PRN   Vancomycin 750-5 MG/150ML-% Soln Commonly known as:  VANCOCIN Inject 150 mLs (750 mg total) into the vein every 12 (twelve) hours.   vancomycin IVPB Inject 750 mg into the vein every 12 (twelve) hours for 22 doses. Indication:  MRSA Bacteremia Last Day of Therapy:  06/15/17 Labs - Sunday/Monday:  CBC/D, CMP, CRP and vancomycin trough. Start taking on:  06/05/2017   Vitamins/Minerals Tabs Take 1 tablet every morning by mouth.            Home Infusion Instuctions  (From admission, onward)        Start     Ordered   06/04/17 0000  Home infusion instructions [facility] May follow [facility] Dosing Protocol; May administer Cathflo as needed to maintain patency of vascular access device.; Flushing of vascular access device: per [facility] Protocol: 0.9% NaCl pre/post medica...    Question Answer Comment  Instructions May follow [facility] Dosing Protocol   Instructions May administer Cathflo as needed to maintain patency of vascular access device.   Instructions Flushing of vascular access device: per [facility] Protocol: 0.9% NaCl  pre/post medication administration and prn patency; Heparin 100 u/ml, 5ml for implanted ports and Heparin 10u/ml, 5ml for all other central venous catheters.   Instructions May follow [facility] Anaphylaxis Protocol for First Dose Administration in the home: 0.9% NaCl at 25-50 ml/hr to maintain IV access for protocol meds. Epinephrine 0.3 ml IV/IM PRN and Benadryl 25-50 IV/IM PRN s/s of anaphylaxis.   Instructions [facility] Infusion Coordinator (RN) to assist per patient IV care needs in the home PRN.      12 /06/18 1751         Total Time in preparing paper work, data evaluation and todays exam - 35 minutes  Dustin Flock M.D on 06/04/2017 at 5:51 PM  [facility]   Office  [phone]

## 2017-06-04 NOTE — Care Management Note (Signed)
Case Management Note  Patient Details  Name: [name] MRN: [id number] Date of Birth: [birth year]  Subjective/Objective:  Case discussed with Dr. Posey Pronto and Dr. Ola Spurr. Orders to give 9 pm Van dose earlier placed.  Spoke with patient and spoke with patients mom. Patient will be ready for discharge at 8 pm. Mom concerned that patients girlfriend will try and take him home. She is adamant that she does not want the girlfriend taking him out of this hospital. I placed her on speaker phone with CSW and primary nurse Anderson Malta present. I told her the hospital would provide the girlfriend with no information regarding his discharge and that she needed to be here at 8 pm to pick patient up. She verbalized understanding. Ordered patient a walker from [facility].  Updated Tiffany, Probation officer of moms concerns and plan for patient to be discharged at 8 pm.                   Action/Plan: [facility] to start next scheduled dose of Vancomycin at 8 am 12/7.   Expected Discharge Date:  06/04/17               Expected Discharge Plan:  [place]  In-House Referral:     Discharge planning Services  CM Consult  Post Acute Care Choice:  Home Health Choice offered to:  Patient  DME Arranged:  Walker rolling DME Agency:  [place] Arranged:  RN, PT [facility] Agency:  [place]  Status of Service:  Completed, signed off  If discussed at [place] of Stay Meetings, dates discussed:    Additional Comments:  Jolly Mango, RN 06/04/2017, 1:59 PM

## 2017-06-04 NOTE — Progress Notes (Signed)
MEDICATION RELATED CONSULT NOTE -  Pharmacy Consult for electrolyte management for 56 yo male admitted with infected port. Patient with hypokalemia. Patient currently in surgery. Patient received potassium 69mEq IV x 4 and magnesium 2g IV 1. Patient is receiving NS/47mEq of Potassium at 26mL/hr.   Plan:  Will order BMP/Magnesium/Phosphorus at 2000.   05/29/2017 1806 K 2.9, Mg 1.7, phos 3.2. CCM has ordered magnesium 1 gm IV x 1 and KCl 10 mEq IV Q1H x 6 doses. Will continue current regimen and recheck electrolytes tomorrow with AM labs.   12/1 K 4.0, phos 2.8, Mag 2.0 -  No supp needed at this time. Will recheck labs in AM.   12/2 1444 K 3.3 Will continue current supplement order of Klor-Con 40 mEq po BID, give additional 10 mEq IV Q1H x 2 doses, and recheck electrolytes tomorrow with AM labs.   12/3: K= 3.1. Will continue current supplementation of KCl 40 mEq PO BID. Will give an additional 10 mEq IV x 3 doses IV and recheck electrolytes in am.  12/4: k= 3.4. Will continue current supplemetation of KCl 40 mEq PO BID. Since lab value is close to therapeutic. Will not supplement today. Will recheck in am.    12/4: Magnesium: 1.6; K=3.9. Will give Magnesium 400 mg PO x 1. Will recheck Magnesium in am.   06/04/17 K 3.7, Ca 8.3, albumin 2.2 (05/29/17), adjusted Ca 9.7, Mg 1.6. The order for Klor-Con 40 mEq po BID will remain active. Will give an additional magnesium sulfate 2 gm IV x 1 and recheck electrolytes tomorrow with AM labs.   Allergies  Allergen Reactions  . Aspirin Itching and Other (See Comments)    Reaction: abdominal pain    Patient Measurements: Height: 5\' 8"  (172.7 cm) Weight: 130 lb 1.1 oz (59 kg) IBW/kg (Calculated) : 68.4  Vital Signs: Temp: 98.5 F (36.9 C) (12/05 1945) Temp Source: Oral (12/05 1945) BP: 105/63 (12/05 1945) Pulse Rate: 92 (12/05 1945) Intake/Output from previous day: 12/05 0701 - 12/06 0700 In: 365 [I.V.:265; IV Piggyback:100] Out: 900  [Urine:900] Intake/Output from this shift: No intake/output data recorded.  Labs: Recent Labs    06/02/17 0450 06/03/17 0620 06/04/17 0507  WBC 17.5* 16.6* 23.0*  HGB 9.9* 9.1* 8.7*  HCT 30.0* 27.1* 26.1*  PLT 153 155 206  CREATININE 0.53* 0.65 0.70  MG 1.7 1.6* 1.6*  PHOS 3.0 4.2  --    Estimated Creatinine Clearance: 85 mL/min (by C-G formula based on SCr of 0.7 mg/dL).    Medical History: Past Medical History:  Diagnosis Date  . Anemia   . BPH without obstruction/lower urinary tract symptoms   . Cancer ([place])   . Cellulitis of scrotum   . Cyst of prostate 05/13/2015  . Diabetes ([place])   . Difficulty urinating   . Dyspnea   . Epididymoorchitis   . ETOH abuse   . Fracture of right tibia and fibula   . GERD (gastroesophageal reflux disease)   . GI bleed   . Malignant neoplasm of sigmoid colon ([place])   . Pancreatitis   . Trichomoniasis   . Wears dentures    Partial upper and lower.  reports poor fit.    Pharmacy will continue to monitor and adjust per consult.   Laural Benes, Pharm.D., BCPS Clinical Pharmacist 06/04/2017,7:31 AM

## 2017-06-04 NOTE — Progress Notes (Signed)
Inpatient Diabetes Program Recommendations  AACE/ADA: New Consensus Statement on Inpatient Glycemic Control (2015)  Target Ranges:  Prepandial:   less than 140 mg/dL      Peak postprandial:   less than 180 mg/dL (1-2 hours)      Critically ill patients:  140 - 180 mg/dL   Lab Results  Component Value Date   GLUCAP 96 06/04/2017   HGBA1C 11.8 (H) 05/14/2015    Review of Glycemic Control  Results for TOD, [name] (MRN [id number]) as of 06/04/2017 09:00  Ref. Range 06/03/2017 12:00 06/03/2017 17:44 06/03/2017 23:45 06/04/2017 00:22 06/04/2017 05:17  Glucose-Capillary Latest Ref Range: 65 - 99 mg/dL 194 (H) 171 (H) 48 (L) 150 (H) 96    Diabetes history: Type 2  Outpatient Diabetes medications: Lantus 10 units qhs, Novolog 48 units tid  Current orders for Inpatient glycemic control: Novolog 0-9 units q6h  Inpatient Diabetes Program Recommendations: Agree with current medications for blood sugar management.   Gentry Fitz, RN, BA, MHA, CDE Diabetes Coordinator Inpatient Diabetes Program  [phone] (Team Pager) [phone] ([place]) 06/04/2017 9:05 AM

## 2017-06-04 NOTE — Progress Notes (Signed)
*  PRELIMINARY RESULTS* Echocardiogram Echocardiogram Transesophageal has been performed.  Sherrie Sport 06/04/2017, 9:39 AM

## 2017-06-04 NOTE — Progress Notes (Signed)
[facility] INFECTIOUS DISEASE PROGRESS NOTE Date of Admission:  05/28/2017     ID: [name] is a 56 y.o. 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 Had TEE neg Really wishes to go home.   ROS  11 reviewed and neg except per hpi  Medications:  Antibiotics Given (last 72 hours)    Date/Time Action Medication Dose Rate   06/01/17 2243 New Bag/Given   vancomycin (VANCOCIN) IVPB 750 mg/150 ml premix 750 mg 150 mL/hr   06/02/17 1238 New Bag/Given   vancomycin (VANCOCIN) IVPB 750 mg/150 ml premix 750 mg 150 mL/hr   06/02/17 1737 New Bag/Given   piperacillin-tazobactam (ZOSYN) IVPB 3.375 g 3.375 g 12.5 mL/hr   06/02/17 2115 New Bag/Given   vancomycin (VANCOCIN) IVPB 750 mg/150 ml premix 750 mg 150 mL/hr   06/03/17 0010 New Bag/Given   piperacillin-tazobactam (ZOSYN) IVPB 3.375 g 3.375 g 12.5 mL/hr   06/03/17 0817 New Bag/Given   piperacillin-tazobactam (ZOSYN) IVPB 3.375 g 3.375 g 12.5 mL/hr   06/03/17 0944 New Bag/Given   vancomycin (VANCOCIN) IVPB 750 mg/150 ml premix 750 mg 150 mL/hr   06/03/17 1632 New Bag/Given   piperacillin-tazobactam (ZOSYN) IVPB 3.375 g 3.375 g 12.5 mL/hr   06/04/17 0133 New Bag/Given   piperacillin-tazobactam (ZOSYN) IVPB 3.375 g 3.375 g 12.5 mL/hr     . amitriptyline  25 mg Oral QHS  . aspirin EC  81 mg Oral Daily  . enoxaparin (LOVENOX) injection  40 mg Subcutaneous Q24H  . fentaNYL      . folic acid  1 mg Oral Daily  . guaiFENesin  600 mg Oral BID  . insulin aspart  0-5 Units Subcutaneous QHS  . insulin aspart  0-9 Units Subcutaneous TID WC  . lipase/protease/amylase  36,000 Units Oral TID AC  . midazolam      . midodrine  5 mg Oral TID WC  . multivitamin with minerals  1 tablet Oral Daily  . mupirocin ointment  1 application Nasal  BID  . pantoprazole  40 mg Oral QAC breakfast  . potassium chloride  40 mEq Oral BID  . sodium chloride flush  10-40 mL Intracatheter Q12H  . sodium chloride flush      . thiamine  100 mg Oral Daily    Objective: Vital signs in last 24 hours: Temp:  [98.5 F (36.9 C)-99.3 F (37.4 C)] 99.3 F (37.4 C) (12/06 0825) Pulse Rate:  [85-101] 85 (12/06 1026) Resp:  [14-24] 14 (12/06 1026) BP: (83-121)/(49-79) 90/62 (12/06 1026) SpO2:  [93 %-99 %] 96 % (12/06 1026) Constitutional: sitting up in bed, improved mentation  HENT: anicteric Mouth/Throat: oroarynx is  dry. No oropharyngeal exudate.  Cardiovascular: Normal rate, regular rhythm and normal heart sounds.  Pulmonary/Chest: Effort normal and breath sounds normal. No respiratory distress. He has no wheezes.  ANt chest wall PC site with healing wound Abdominal: Soft. Bowel sounds are normal. He exhibits no distension. There is no tenderness.  Lymphadenopathy: He has no cervical adenopathy.  Neurological: lethargic Skin: Skin is warm and dry. No rash noted. No erythema.  Psychiatric: improved mentation    Lab Results Recent Labs    06/03/17 0620 06/04/17 0507  WBC 16.6* 23.0*  HGB 9.1* 8.7*  HCT 27.1* 26.1*  NA 141  138  K 3.9 3.7  CL 113* 105  CO2 20* 24  BUN <5* <5*  CREATININE 0.65 0.70    Microbiology: Results for orders placed or performed during the hospital encounter of 05/28/17  Culture, blood (Routine x 2)     Status: Abnormal   Collection Time: 05/28/17  6:21 PM  Result Value Ref Range Status   Specimen Description BLOOD BLOOD LEFT ARM  Final   Special Requests   Final    BOTTLES DRAWN AEROBIC AND ANAEROBIC Blood Culture adequate volume   Culture  Setup Time   Final    GRAM POSITIVE COCCI IN BOTH AEROBIC AND ANAEROBIC BOTTLES CRITICAL RESULT CALLED TO, READ BACK BY AND VERIFIED WITH: KRISTIN MERRILL AT 0626 05/29/17 SDR    Culture METHICILLIN RESISTANT STAPHYLOCOCCUS AUREUS (A)  Final   Report Status  05/31/2017 FINAL  Final   Organism ID, Bacteria METHICILLIN RESISTANT STAPHYLOCOCCUS AUREUS  Final      Susceptibility   Methicillin resistant staphylococcus aureus - MIC*    CIPROFLOXACIN >=8 RESISTANT Resistant     ERYTHROMYCIN >=8 RESISTANT Resistant     GENTAMICIN <=0.5 SENSITIVE Sensitive     OXACILLIN >=4 RESISTANT Resistant     TETRACYCLINE <=1 SENSITIVE Sensitive     VANCOMYCIN <=0.5 SENSITIVE Sensitive     TRIMETH/SULFA <=10 SENSITIVE Sensitive     CLINDAMYCIN RESISTANT Resistant     RIFAMPIN <=0.5 SENSITIVE Sensitive     Inducible Clindamycin POSITIVE Resistant     * METHICILLIN RESISTANT STAPHYLOCOCCUS AUREUS  Blood Culture ID Panel (Reflexed)     Status: Abnormal   Collection Time: 05/28/17  6:21 PM  Result Value Ref Range Status   Enterococcus species NOT DETECTED NOT DETECTED Final   Listeria monocytogenes NOT DETECTED NOT DETECTED Final   Staphylococcus species DETECTED (A) NOT DETECTED Final    Comment: CRITICAL RESULT CALLED TO, READ BACK BY AND VERIFIED WITH:  KRISTIN MERRILL AT 9485 05/29/17 SDR    Staphylococcus aureus DETECTED (A) NOT DETECTED Final    Comment: Methicillin (oxacillin)-resistant Staphylococcus aureus (MRSA). MRSA is predictably resistant to beta-lactam antibiotics (except ceftaroline). Preferred therapy is vancomycin unless clinically contraindicated. Patient requires contact precautions if  hospitalized. CRITICAL RESULT CALLED TO, READ BACK BY AND VERIFIED WITH:  KRISTIN MERRILL AT 4627 05/29/17 SDR    Methicillin resistance DETECTED (A) NOT DETECTED Final    Comment: CRITICAL RESULT CALLED TO, READ BACK BY AND VERIFIED WITH:  KRISTIN MERRILL AT 0350 05/29/17 SDR    Streptococcus species NOT DETECTED NOT DETECTED Final   Streptococcus agalactiae NOT DETECTED NOT DETECTED Final   Streptococcus pneumoniae NOT DETECTED NOT DETECTED Final   Streptococcus pyogenes NOT DETECTED NOT DETECTED Final   Acinetobacter baumannii NOT DETECTED NOT DETECTED  Final   Enterobacteriaceae species NOT DETECTED NOT DETECTED Final   Enterobacter cloacae complex NOT DETECTED NOT DETECTED Final   Escherichia coli NOT DETECTED NOT DETECTED Final   Klebsiella oxytoca NOT DETECTED NOT DETECTED Final   Klebsiella pneumoniae NOT DETECTED NOT DETECTED Final   Proteus species NOT DETECTED NOT DETECTED Final   Serratia marcescens NOT DETECTED NOT DETECTED Final   Haemophilus influenzae NOT DETECTED NOT DETECTED Final   Neisseria meningitidis NOT DETECTED NOT DETECTED Final   Pseudomonas aeruginosa NOT DETECTED NOT DETECTED Final   Candida albicans NOT DETECTED NOT DETECTED Final   Candida glabrata NOT DETECTED NOT DETECTED Final   Candida krusei NOT DETECTED NOT DETECTED Final   Candida parapsilosis NOT DETECTED NOT DETECTED Final  Candida tropicalis NOT DETECTED NOT DETECTED Final  Culture, blood (Routine x 2)     Status: Abnormal   Collection Time: 05/28/17  7:10 PM  Result Value Ref Range Status   Specimen Description BLOOD LEFT ANTECUBITAL  Final   Special Requests   Final    BOTTLES DRAWN AEROBIC AND ANAEROBIC Blood Culture adequate volume   Culture  Setup Time   Final    GRAM POSITIVE COCCI IN BOTH AEROBIC AND ANAEROBIC BOTTLES CRITICAL VALUE NOTED.  VALUE IS CONSISTENT WITH PREVIOUSLY REPORTED AND CALLED VALUE.    Culture (A)  Final    STAPHYLOCOCCUS AUREUS SUSCEPTIBILITIES PERFORMED ON PREVIOUS CULTURE WITHIN THE LAST 5 DAYS. Performed at [facility], [place] [address] [place], [place] [address]    Report Status 05/31/2017 FINAL  Final  Surgical pcr screen     Status: Abnormal   Collection Time: 05/28/17 10:44 PM  Result Value Ref Range Status   MRSA, PCR POSITIVE (A) NEGATIVE Final    Comment: RESULT CALLED TO, READ BACK BY AND VERIFIED WITH: MAT PAGE ON 05/29/17 AT 4765 BY JAG    Staphylococcus aureus POSITIVE (A) NEGATIVE Final    Comment: RESULT CALLED TO, READ BACK BY AND VERIFIED WITH: MAT PAGE ON 05/29/17 AT 0213 BY  JAG (NOTE) The Xpert SA Assay (FDA approved for NASAL specimens in patients 81 years of age and older), is one component of a comprehensive surveillance program. It is not intended to diagnose infection nor to guide or monitor treatment.   Aerobic/Anaerobic Culture (surgical/deep wound)     Status: None   Collection Time: 05/29/17  1:31 PM  Result Value Ref Range Status   Specimen Description WOUND RIGHT UPPER CHEST  Final   Special Requests NONE  Final   Gram Stain   Final    FEW WBC PRESENT, PREDOMINANTLY PMN FEW GRAM POSITIVE COCCI IN CLUSTERS    Culture   Final    FEW METHICILLIN RESISTANT STAPHYLOCOCCUS AUREUS NO ANAEROBES ISOLATED Performed at [facility], [place] [address], [place], [place] [address]    Report Status 06/03/2017 FINAL  Final   Organism ID, Bacteria METHICILLIN RESISTANT STAPHYLOCOCCUS AUREUS  Final      Susceptibility   Methicillin resistant staphylococcus aureus - MIC*    CIPROFLOXACIN >=8 RESISTANT Resistant     ERYTHROMYCIN >=8 RESISTANT Resistant     GENTAMICIN <=0.5 SENSITIVE Sensitive     OXACILLIN >=4 RESISTANT Resistant     TETRACYCLINE <=1 SENSITIVE Sensitive     VANCOMYCIN <=0.5 SENSITIVE Sensitive     TRIMETH/SULFA <=10 SENSITIVE Sensitive     CLINDAMYCIN RESISTANT Resistant     RIFAMPIN <=0.5 SENSITIVE Sensitive     Inducible Clindamycin POSITIVE Resistant     * FEW METHICILLIN RESISTANT STAPHYLOCOCCUS AUREUS  Culture, blood (single) w Reflex to ID Panel     Status: None   Collection Time: 05/29/17  6:06 PM  Result Value Ref Range Status   Specimen Description BLOOD LEFT ASSIST CONTROL  Final   Special Requests   Final    BOTTLES DRAWN AEROBIC AND ANAEROBIC Blood Culture adequate volume   Culture NO GROWTH 5 DAYS  Final   Report Status 06/03/2017 FINAL  Final  CULTURE, BLOOD (ROUTINE X 2) w Reflex to ID Panel     Status: None   Collection Time: 05/30/17 11:35 AM  Result Value Ref Range Status   Specimen Description BLOOD LEFT  ASSIST CONTROL  Final   Special Requests  Final    BOTTLES DRAWN AEROBIC AND ANAEROBIC Blood Culture adequate volume   Culture NO GROWTH 5 DAYS  Final   Report Status 06/04/2017 FINAL  Final  CULTURE, BLOOD (ROUTINE X 2) w Reflex to ID Panel     Status: None   Collection Time: 05/30/17 11:59 AM  Result Value Ref Range Status   Specimen Description BLOOD LEFT HAND  Final   Special Requests   Final    BOTTLES DRAWN AEROBIC AND ANAEROBIC Blood Culture adequate volume   Culture NO GROWTH 5 DAYS  Final   Report Status 06/04/2017 FINAL  Final  CULTURE, BLOOD (ROUTINE X 2) w Reflex to ID Panel     Status: None (Preliminary result)   Collection Time: 05/31/17  5:01 AM  Result Value Ref Range Status   Specimen Description BLOOD LEFT ANTECUBITAL  Final   Special Requests   Final    BOTTLES DRAWN AEROBIC AND ANAEROBIC Blood Culture adequate volume   Culture NO GROWTH 4 DAYS  Final   Report Status PENDING  Incomplete  CULTURE, BLOOD (ROUTINE X 2) w Reflex to ID Panel     Status: None (Preliminary result)   Collection Time: 05/31/17  5:10 AM  Result Value Ref Range Status   Specimen Description BLOOD LEFT HAND  Final   Special Requests   Final    BOTTLES DRAWN AEROBIC AND ANAEROBIC Blood Culture adequate volume   Culture NO GROWTH 4 DAYS  Final   Report Status PENDING  Incomplete  CULTURE, BLOOD (ROUTINE X 2) w Reflex to ID Panel     Status: None (Preliminary result)   Collection Time: 06/01/17  4:50 AM  Result Value Ref Range Status   Specimen Description BLOOD LEFT AC  Final   Special Requests   Final    BOTTLES DRAWN AEROBIC AND ANAEROBIC Blood Culture adequate volume   Culture NO GROWTH 3 DAYS  Final   Report Status PENDING  Incomplete  CULTURE, BLOOD (ROUTINE X 2) w Reflex to ID Panel     Status: None (Preliminary result)   Collection Time: 06/01/17  4:57 AM  Result Value Ref Range Status   Specimen Description BLOOD LEFT HAND  Final   Special Requests   Final    BOTTLES DRAWN  AEROBIC AND ANAEROBIC Blood Culture adequate volume   Culture NO GROWTH 3 DAYS  Final   Report Status PENDING  Incomplete  CULTURE, BLOOD (ROUTINE X 2) w Reflex to ID Panel     Status: None (Preliminary result)   Collection Time: 06/02/17  4:50 AM  Result Value Ref Range Status   Specimen Description BLOOD BLOOD LEFT HAND  Final   Special Requests   Final    BOTTLES DRAWN AEROBIC AND ANAEROBIC Blood Culture adequate volume   Culture NO GROWTH 2 DAYS  Final   Report Status PENDING  Incomplete  CULTURE, BLOOD (ROUTINE X 2) w Reflex to ID Panel     Status: None (Preliminary result)   Collection Time: 06/02/17  4:57 AM  Result Value Ref Range Status   Specimen Description BLOOD RIGHT ANTECUBITAL  Final   Special Requests   Final    BOTTLES DRAWN AEROBIC AND ANAEROBIC Blood Culture adequate volume   Culture NO GROWTH 2 DAYS  Final   Report Status PENDING  Incomplete    Studies/Results: Study Conclusions ECHO 05/30/17 - Left ventricle: Systolic function was mildly reduced. The   estimated ejection fraction was in the range of 45% to 50%. -  Aortic valve: There was trivial regurgitation. - Mitral valve: There was mild regurgitation. - Tricuspid valve: There was moderate regurgitation.  Assessment/Plan: [name] is a 56 y.o. male with MRSA bacteremia from infected Portacath, now s/p removal on 11/30.  He had cath removed 11/30 and fu bcx neg. TTE neg but has mod TR.  No other signs of metastatic sites of infection. 12/4  Confusion improved. Some controversy over decision making on his behalf- girlfriend refused PICC line. Psych to see.   12/5 Temp stabilized, wbc down to 16. Picc placed. Much more alert.  12/6 - no fever, TEE done - WBC up. UA TNTC WBC - had been 6-30  Wbc on admit.   Recommendations Persistent Leukocytosis - wu neg. Will follow up on otpt labs  MRSA bacteremia from infected Portacath -Cath removed 11/30 and fu bcx neg 11/30 Continue vancomycin. TEE Done neg -  can be dced home on IV vanco and oral augmentin for the PNA -Will need 2 weeks of IV Vancomycin (see abx order sheet)  PNA -change to augmentin    Possible UTI- UA with TNTC WBC. UCX pending - will follow as otpt.   Thank you very much for the consult. Will follow with you.  Leonel Ramsay   06/04/2017, 1:03 PM

## 2017-06-04 NOTE — Care Management Note (Addendum)
Case Management Note  Patient Details  Name: MIKO SIRICO MRN: [id number] Date of Birth: 10-12-1960  Subjective/Objective:   RNCM consult for home IV antibiotics. [facility] to [facility]. Waiting on ID antibiotic orders.  [facility] will provide SN and PT. Lives with mother. Has a walker. PCP is Harriett Burns.                 Action/Plan:   Expected Discharge Date:  05/30/17               Expected Discharge Plan:  [place]  In-House Referral:     Discharge planning Services  CM Consult  Post Acute Care Choice:  Home Health Choice offered to:  Patient  DME Arranged:    DME Agency:     HH Arranged:  RN, PT [place] Agency:  [facility]  Status of Service:  In process, will continue to follow  If discussed at Long Length of Stay Meetings, dates discussed:    Additional Comments:  Jolly Mango, RN 06/04/2017, 11:39 AM

## 2017-06-04 NOTE — Progress Notes (Signed)
PHARMACY CONSULT NOTE FOR:  OUTPATIENT  PARENTERAL ANTIBIOTIC THERAPY (OPAT)  Indication: MRSA Bacteremia Regimen: Vancomycin 750 mg IV Q12H End date: 06/15/2017  Labs to be drawn weekly: CBC with diff, CMP, CRP, vanc trough  IV antibiotic discharge orders are pended. To discharging provider:  please sign these orders via discharge navigator,  Select New Orders & click on the button choice - Manage This Unsigned Work.     Thank you for allowing pharmacy to be a part of this patient's care.  Laural Benes, Pharm.D., BCPS Clinical Pharmacist 06/04/2017, 1:59 PM

## 2017-06-04 NOTE — Progress Notes (Signed)
Infectious Disease Long Term IV Antibiotic Orders Lacy L Soza [birth year]  Diagnosis:  MRSA bacteremia from infected portacath - removed 11/30. Also PNA and UTI  Culture results 11/29 -  Culture, blood (Routine x 2) [[id number]] (Abnormal)  Collected: 05/28/17 1821  Order Status: Completed Specimen: Blood Updated: 05/31/17 0829   Specimen Description BLOOD BLOOD LEFT ARM   Special Requests BOTTLES DRAWN AEROBIC AND ANAEROBIC Blood Culture adequate volume   Culture Setup Time --   GRAM POSITIVE COCCI  IN BOTH AEROBIC AND ANAEROBIC BOTTLES  CRITICAL RESULT CALLED TO, READ BACK BY AND VERIFIED WITH:  KRISTIN MERRILL AT 6546 05/29/17 SDR    Culture METHICILLIN RESISTANT STAPHYLOCOCCUS AUREUS Abnormal    Report Status 05/31/2017 FINAL   Organism ID, Bacteria METHICILLIN RESISTANT STAPHYLOCOCCUS AUREUS  Susceptibility   Methicillin resistant staphylococcus aureus (ZZ00)   Antibiotic Interpretation Microscan Method Status  CIPROFLOXACIN Resistant >=8 RESISTANT MIC Final  ERYTHROMYCIN Resistant >=8 RESISTANT MIC Final  GENTAMICIN Sensitive <=0.5 SENSITIVE MIC Final  OXACILLIN Resistant >=4 RESISTANT MIC Final  TETRACYCLINE Sensitive <=1 SENSITIVE MIC Final  VANCOMYCIN Sensitive <=0.5 SENSITIVE MIC Final  TRIMETH/SULFA Sensitive <=10 SENSITIVE MIC Final  CLINDAMYCIN Resistant RESISTANT MIC Final  RIFAMPIN Sensitive <=0.5 SENSITIVE MIC Final  Inducible Clindamycin Resistant POSITIVE MIC Final         LABS Lab Results  Component Value Date   CREATININE 0.70 06/04/2017   Lab Results  Component Value Date   WBC 23.0 (H) 06/04/2017   HGB 8.7 (L) 06/04/2017   HCT 26.1 (L) 06/04/2017   MCV 90.9 06/04/2017   PLT 206 06/04/2017   Allergies:  Allergies  Allergen Reactions  . Aspirin Itching and Other (See Comments)    Reaction: abdominal pain    Discharge antibiotics Vancomycin              750    mg  every   12            hours .     Goal vancomycin trough 15-20.      Pharmacy to adjust dosing based on levels  Oral augmentin 875 bid until 06/11/17  PICC Care per protocol Labs weekly while on IV antibiotics -FAX weekly labs to [phone] CBC w diff   Comprehensive met panel Vancomycin Trough   CRP   Planned duration of antibiotics 2 weeks   Stop date 12/17 Follow up clinic date Before stop date   Leonel Ramsay, MD

## 2017-06-04 NOTE — Consult Note (Signed)
Pharmacy Antibiotic Note  [name] is a 56 y.o. male admitted on 05/28/2017 with pneumonia and bacteremia.  Pharmacy has been consulted for zosyn dosing. Patient already on vancomycin for MRSA bacteremia. Chest x-ray with multifocal infiltrates, possible aspiration during confusion  Plan: Zosyn 3.375g IV q8h (4 hour infusion).  Continue vanc 750mg  q 12 hr  12/5 @ 2130 VT 15 therapeutic. Drawn appropriately, creatinine has been steadily rising, but renal function stable, Will continue current regimen and will draw another trough 12/7 @ 0900.  If creatinine increases further will draw vanc trough sooner.  Height: 5\' 8"  (172.7 cm) Weight: 130 lb 1.1 oz (59 kg) IBW/kg (Calculated) : 68.4  Temp (24hrs), Avg:98.6 F (37 C), Min:98.5 F (36.9 C), Max:98.7 F (37.1 C)  Recent Labs  Lab 05/28/17 1820 05/28/17 2018 05/28/17 2322  05/29/17 1806 05/30/17 0525 05/31/17 0501 05/31/17 1444 06/01/17 0450 06/02/17 0450 06/03/17 0620 06/03/17 2131  WBC 23.7*  --   --    < > 14.6* 12.1* 7.6  --  6.5 17.5* 16.6*  --   CREATININE 0.65  --   --    < > 0.53* 0.41* 0.33*  --  0.48* 0.53* 0.65  --   LATICACIDVEN 3.0* 1.2 1.5  --  0.9  --   --   --   --   --   --   --   VANCOTROUGH  --   --   --   --   --   --   --  16  --   --   --  15   < > = values in this interval not displayed.    Estimated Creatinine Clearance: 85 mL/min (by C-G formula based on SCr of 0.65 mg/dL).    Allergies  Allergen Reactions  . Aspirin Itching and Other (See Comments)    Reaction: abdominal pain    Antimicrobials this admission: vancomycin 11/29 >>  zosyn 11/29 >> 11/30, 12/4>>  Dose adjustments this admission:   Microbiology results: 11/29 BCx: MRSA 11/30 wound: MRSA 12/1 BCx: NG x 3   Thank you for allowing pharmacy to be a part of this patient's care.  Tobie Lords, PharmD, BCPS Clinical Pharmacist 06/04/2017

## 2017-06-04 NOTE — Consult Note (Addendum)
Pharmacy Antibiotic Note  [name] is a 56 y.o. male admitted on 05/28/2017 with pneumonia and bacteremia.  Pharmacy has been consulted for zosyn dosing. Patient already on vancomycin for MRSA bacteremia. Chest x-ray with multifocal infiltrates, possible aspiration during confusion  Plan: Zosyn 3.375g IV q8h (4 hour infusion).  Continue vanc 750mg  q 12 hr  12/5 @ 2130 VT 15 therapeutic. Drawn appropriately, creatinine has been steadily rising, but renal function stable, Will continue current regimen and will draw another trough 12/7 @ 0900.  If creatinine increases further will draw vanc trough sooner.  12/6 14:00 patient is receiving AM dose of vancomycin late due to transfer to procedural area (750 mg IV started at 13:23). Yesterday VT was therapeutic at 15 mcg/mL. Spoke with Dr. Ola Spurr - discharge planned for this evening. Will give vancomycin 500 mg IV x 1 (expected Css change 10 mcg/mL, T1/2 approx 12 hours) today at 20:00 to facilitate discharge. We will give this smaller than maintenance dose due to late administration and need for early administration. Home infusion services expected to begin 06/05/17 at 08:00. Please call pharmacy if there are any questions about this plan. I re-entered vancomycin 750 mg IV Q12H to begin IP tomorrow in case discharge is interrupted, and as OPAT orders for home infusion.   Height: 5\' 8"  (172.7 cm) Weight: 130 lb 1.1 oz (59 kg) IBW/kg (Calculated) : 68.4  Temp (24hrs), Avg:98.8 F (37.1 C), Min:98.5 F (36.9 C), Max:99.3 F (37.4 C)  Recent Labs  Lab 05/28/17 1820 05/28/17 2018 05/28/17 2322  05/29/17 1806  05/31/17 0501 05/31/17 1444 06/01/17 0450 06/02/17 0450 06/03/17 0620 06/03/17 2131 06/04/17 0507  WBC 23.7*  --   --    < > 14.6*   < > 7.6  --  6.5 17.5* 16.6*  --  23.0*  CREATININE 0.65  --   --    < > 0.53*   < > 0.33*  --  0.48* 0.53* 0.65  --  0.70  LATICACIDVEN 3.0* 1.2 1.5  --  0.9  --   --   --   --   --   --   --   --    VANCOTROUGH  --   --   --   --   --   --   --  16  --   --   --  15  --    < > = values in this interval not displayed.    Estimated Creatinine Clearance: 85 mL/min (by C-G formula based on SCr of 0.7 mg/dL).    Allergies  Allergen Reactions  . Aspirin Itching and Other (See Comments)    Reaction: abdominal pain    Antimicrobials this admission: vancomycin 11/29 >>  zosyn 11/29 >> 11/30, 12/4>>  Dose adjustments this admission:   Microbiology results: 11/29 BCx: MRSA 11/30 wound: MRSA 12/1 BCx: NG x 3   Thank you for allowing pharmacy to be a part of this patient's care.  [name], PharmD, BCPS Clinical Pharmacist 06/04/2017
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
## 2017-06-05 ENCOUNTER — Encounter: Payer: Self-pay | Admitting: Internal Medicine

## 2017-06-05 ENCOUNTER — Emergency Department
Admission: EM | Admit: 2017-06-05 | Discharge: 2017-06-06 | Disposition: A | Discharge status: Home / Self Care | Payer: Medicaid Other | Attending: Emergency Medicine | Admitting: Emergency Medicine

## 2017-06-05 DIAGNOSIS — E119 Type 2 diabetes mellitus without complications: Secondary | ICD-10-CM | POA: Insufficient documentation

## 2017-06-05 DIAGNOSIS — Z7982 Long term (current) use of aspirin: Secondary | ICD-10-CM | POA: Insufficient documentation

## 2017-06-05 DIAGNOSIS — Z79899 Other long term (current) drug therapy: Secondary | ICD-10-CM | POA: Insufficient documentation

## 2017-06-05 DIAGNOSIS — J690 Pneumonitis due to inhalation of food and vomit: Secondary | ICD-10-CM | POA: Diagnosis not present

## 2017-06-05 DIAGNOSIS — F0390 Unspecified dementia without behavioral disturbance: Secondary | ICD-10-CM | POA: Insufficient documentation

## 2017-06-05 DIAGNOSIS — G8929 Other chronic pain: Secondary | ICD-10-CM | POA: Insufficient documentation

## 2017-06-05 DIAGNOSIS — R7881 Bacteremia: Secondary | ICD-10-CM | POA: Diagnosis not present

## 2017-06-05 DIAGNOSIS — Z794 Long term (current) use of insulin: Secondary | ICD-10-CM | POA: Insufficient documentation

## 2017-06-05 DIAGNOSIS — Z760 Encounter for issue of repeat prescription: Secondary | ICD-10-CM | POA: Diagnosis present

## 2017-06-05 LAB — CULTURE, BLOOD (ROUTINE X 2)
Culture: NO GROWTH
Culture: NO GROWTH
SPECIAL REQUESTS: ADEQUATE
Special Requests: ADEQUATE
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
## 2017-06-05 NOTE — ED Triage Notes (Signed)
Pt here with ems for medication refill. Pt is not sure what medicaiton he needs. Pt states he was sent here by ems from home health for iv medication. Pt was recently here for infection and discharged with what appears to be iv vancomycin.
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
## 2017-06-05 NOTE — ED Triage Notes (Signed)
First Nurse: patient brought in by ems from home. Patient has pancreatic cancer. Patient recently had a port removed because it was infected. Patient has missed 2 doses of his medication. Patient is seen by [facility] and they told him to come in to get his medication. EMS contacted [facility] and they were unable to tell him what medication the patient needs.

## 2017-06-05 NOTE — ED Triage Notes (Signed)
Patient ambulating in the lobby in distress at this time. Patient updated on wait time. Patient verbalized understanding.

## 2017-06-06 ENCOUNTER — Emergency Department: Payer: Medicaid Other

## 2017-06-06 LAB — BASIC METABOLIC PANEL
Anion gap: 8 (ref 5–15)
BUN: 9 mg/dL (ref 6–20)
CALCIUM: 8.6 mg/dL — AB (ref 8.9–10.3)
CHLORIDE: 106 mmol/L (ref 101–111)
CO2: 24 mmol/L (ref 22–32)
CREATININE: 0.71 mg/dL (ref 0.61–1.24)
GFR calc Af Amer: >60 mL/min (ref >60)
GFR calc non Af Amer: >60 mL/min (ref >60)
Glucose, Bld: 226 mg/dL — ABNORMAL HIGH (ref 65–99)
Potassium: 3.1 mmol/L — ABNORMAL LOW (ref 3.5–5.1)
SODIUM: 138 mmol/L (ref 135–145)

## 2017-06-06 LAB — CBC
HCT: 26.6 % — ABNORMAL LOW (ref 40.0–52.0)
Hemoglobin: 8.7 g/dL — ABNORMAL LOW (ref 13.0–18.0)
MCH: 30.5 pg (ref 26.0–34.0)
MCHC: 32.7 g/dL (ref 32.0–36.0)
MCV: 93.2 fL (ref 80.0–100.0)
PLATELETS: 217 10*3/uL (ref 150–440)
RBC: 2.86 MIL/uL — ABNORMAL LOW (ref 4.40–5.90)
RDW: 15.6 % — AB (ref 11.5–14.5)
WBC: 12.4 10*3/uL — ABNORMAL HIGH (ref 3.8–10.6)

## 2017-06-06 LAB — CULTURE, BLOOD (ROUTINE X 2)
CULTURE: NO GROWTH
CULTURE: NO GROWTH
SPECIAL REQUESTS: ADEQUATE
Special Requests: ADEQUATE

## 2017-06-06 LAB — TROPONIN I

## 2017-06-06 MED ORDER — VANCOMYCIN HCL IN DEXTROSE 750-5 MG/150ML-% IV SOLN
750.0000 mg | Freq: Once | INTRAVENOUS | Status: AC
  Administered 2017-06-06: 750 mg via INTRAVENOUS
  Filled 2017-06-06: qty 150

## 2017-06-06 MED ORDER — VANCOMYCIN HCL IN DEXTROSE 1-5 GM/200ML-% IV SOLN: 1000.0000 mg | Freq: Once | INTRAVENOUS | Status: DC

## 2017-06-06 NOTE — ED Provider Notes (Signed)
[facility] Emergency Department Provider Note   ____________________________________________   First MD Initiated Contact with Patient 06/06/17 0003     (approximate)  I have reviewed the triage vital signs and the nursing notes.   HISTORY  Chief Complaint Medication Refill    HPI [name] is a 56 y.o. male who comes into the hospital today for what he states is a medication refill.  The patient was in the hospital and found to have bacteremia due to a port.  The patient also has prostate cancer.  He reports that he was not at home when the home health nurse came to educate him on how to give his antibiotics.  He reports that the nurse told him to come to the hospital so that we could show him how to get his medication and to give him his medication.  The patient's family who is with him states that the patient had been complaining of chest pain and states that he was feeling like he was going to have a heart attack.  She also states that he was confused but the patient vehemently denies that he had chest pain.  He denies any pain at this time.  The patient was discharged from the hospital last night.  He has a PICC line in place to receive his medications.  He is asking for documentation stating that he was here to get his medication.   Past Medical History:  Diagnosis Date  . Anemia   . BPH without obstruction/lower urinary tract symptoms   . Cancer ([place])   . Cellulitis of scrotum   . Cyst of prostate 05/13/2015  . Diabetes ([place])   . Difficulty urinating   . Dyspnea   . Epididymoorchitis   . ETOH abuse   . Fracture of right tibia and fibula   . GERD (gastroesophageal reflux disease)   . GI bleed   . Malignant neoplasm of sigmoid colon ([place])   . Pancreatitis   . Trichomoniasis   . Wears dentures    Partial upper and lower.  reports poor fit.    Patient Active Problem List   Diagnosis Date Noted  . 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 Sepsis ([place]) 05/28/2017  . Local infection due to central venous catheter   . Tobacco use disorder 04/21/2017  . 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 Moderate malnutrition ([place]) 02/12/2017  . Pancreatic insufficiency 02/12/2017  . Chronic abdominal pain 01/29/2017  . Malignant neoplasm of sigmoid colon ([place]) 01/15/2017  . Shock ([place]) 12/02/2016  . Acute posthemorrhagic anemia   . Benign neoplasm of ascending colon   . Colorectal polyps   . Noninfectious diarrhea   . Syncope 12/01/2016  . Chronic diarrhea 12/01/2016  . Pancreatitis 04/17/2016  . Noncompliance 07/05/2015  . Tobacco dependence 07/05/2015  . Fracture of right tibia and fibula 06/05/2015  . Type 2 diabetes mellitus with hyperglycemia ([place]) 06/05/2015  . Prostatic cyst 05/13/2015  . GI bleed 05/12/2015  . Type 2 diabetes mellitus ([place]) 05/12/2015  . GERD (gastroesophageal reflux disease) 05/12/2015  . ETOH abuse 05/12/2015  . BPH (benign prostatic hyperplasia) 05/12/2015    Past Surgical History:  Procedure Laterality Date  . COLONOSCOPY WITH PROPOFOL N/A 12/02/2016   Procedure: COLONOSCOPY WITH PROPOFOL;  Surgeon: Lucilla Lame, MD;  [facility] ENDOSCOPY;  Service: Endoscopy;  Laterality: N/A;  . ESOPHAGOGASTRODUODENOSCOPY (EGD) WITH PROPOFOL N/A 12/02/2016   Procedure: ESOPHAGOGASTRODUODENOSCOPY (EGD) WITH PROPOFOL;  Surgeon: Allen Norris,  Darren, MD;  Location: [facility] ENDOSCOPY;  Service: Endoscopy;  Laterality: N/A;  . FLEXIBLE SIGMOIDOSCOPY N/A 02/05/2017   Procedure: FLEXIBLE SIGMOIDOSCOPY;  Surgeon: Lucilla Lame, MD;  Location: [facility];  Service: Gastroenterology;  Laterality: N/A;  Needs labs drawn. Needs to come in early.  No anesthesia  . FRACTURE SURGERY     TIBIA AND FIBULA  . FRACTURE SURGERY    . LAPAROSCOPIC PARTIAL COLECTOMY  04/17/2017   [facility]  . PORT-A-CATH REMOVAL N/A 05/29/2017   Procedure: REMOVAL PORT-A-CATH;  Surgeon: Vickie Epley, MD;  Location: [facility] ORS;   Service: Vascular;  Laterality: N/A;  . PORTACATH PLACEMENT Right 05/18/2017   Procedure: INSERTION PORT-A-CATH;  Surgeon: Vickie Epley, MD;  Location: [facility] ORS;  Service: Vascular;  Laterality: Right;  . resection of pancreas    . SCROTAL EXPLORATION    . TEE WITHOUT CARDIOVERSION N/A 06/04/2017   Procedure: TRANSESOPHAGEAL ECHOCARDIOGRAM (TEE);  Surgeon: Corey Skains, MD;  Location: [facility] ORS;  Service: Cardiovascular;  Laterality: N/A;    Prior to Admission medications   Medication Sig Start Date End Date Taking? Authorizing Provider  amitriptyline (ELAVIL) 25 MG tablet Take 25 mg by mouth. 02/17/17   [provider]  amoxicillin-clavulanate (AUGMENTIN) 875-125 MG tablet Take 1 tablet by mouth 2 (two) times daily for 7 days. 06/04/17 06/11/17  Dustin Flock, MD  aspirin EC 81 MG tablet Take 81 mg by mouth. 07/20/15   [provider]  B-D UF III MINI PEN NEEDLES 31G X 5 MM MISC  10/17/16   [provider]  Blood Glucose Monitoring Suppl (GLUCOCOM BLOOD GLUCOSE MONITOR) DEVI use as directed 08/13/15   [provider]  cyclobenzaprine (FLEXERIL) 10 MG tablet Take 10 mg by mouth 2 (two) times daily. 02/15/16   [provider]  diphenoxylate-atropine (LOMOTIL) 2.5-0.025 MG tablet take 1 tablet four times a day if needed 01/01/17   [provider]  LANTUS SOLOSTAR 100 UNIT/ML Solostar Pen Inject 10 Units into the skin 2 (two) times daily. Patient taking differently: Inject 10 Units into the skin daily at 10 pm.  07/02/16   Harvest Dark, MD  lidocaine-prilocaine (EMLA) cream Apply over porta cath 1-2 hours prior to chemo. 05/26/17   Earlie Server, MD  lipase/protease/amylase (CREON) 36000 UNITS CPEP capsule Take 1 capsule (36,000 Units total) 3 (three) times daily before meals by mouth. 05/05/17   Earlie Server, MD  midodrine (PROAMATINE) 5 MG tablet Take 1 tablet (5 mg total) by mouth 3 (three) times daily with meals. 06/04/17   Dustin Flock, MD    NOVOLOG FLEXPEN 100 UNIT/ML FlexPen Inject 48 Units into the skin 3 (three) times daily with meals.  05/16/15   [provider]  ondansetron (ZOFRAN) 8 MG tablet Take 1 tablet (8 mg total) by mouth every 8 (eight) hours as needed for nausea or vomiting. 05/26/17   Earlie Server, MD  Oxycodone HCl 10 MG TABS Take 1 tablet (10 mg total) every 6 (six) hours as needed by mouth. 05/18/17   Earlie Server, MD  prochlorperazine (COMPAZINE) 10 MG tablet Take 1 tablet (10 mg total) by mouth every 6 (six) hours as needed for nausea or vomiting. 05/26/17   Earlie Server, MD  RA ACETAMINOPHEN 325 MG tablet take 2 tablets by mouth every 6 hours 04/21/17   [provider]  traZODone (DESYREL) 50 MG tablet take 1 tablet by mouth at bedtime TO HELP WITH SLEEP-PRN 12/05/16   [provider]  Vancomycin (VANCOCIN) 750-5  MG/150ML-% SOLN Inject 150 mLs (750 mg total) into the vein every 12 (twelve) hours. 06/04/17   Dustin Flock, MD  vancomycin IVPB Inject 750 mg into the vein every 12 (twelve) hours for 22 doses. Indication:  MRSA Bacteremia Last Day of Therapy:  06/15/17 Labs - Sunday/Monday:  CBC/D, CMP, CRP and vancomycin trough. 06/05/17 06/16/17  Dustin Flock, MD  Vitamins/Minerals TABS Take 1 tablet every morning by mouth.  05/14/15   [provider]    Allergies Aspirin  Family History  Problem Relation Age of Onset  . Cancer - Colon Father   . Colon cancer Brother     Social History Social History   Tobacco Use  . Smoking status: Current Every Day Smoker    Packs/day: 0.25    Years: 41.00    Pack years: 10.25    Types: Cigarettes  . Smokeless tobacco: Never Used  . Tobacco comment: since age 24  Substance Use Topics  . Alcohol use: Yes    Alcohol/week: 0.0 oz    Comment: occasionally - but says not drinkning currently  . Drug use: No    Review of Systems  Constitutional: No fever/chills Eyes: No visual changes. ENT: No sore throat. Cardiovascular:  chest  pain. Respiratory: Denies shortness of breath. Gastrointestinal: No abdominal pain.  No nausea, no vomiting.  No diarrhea.  No constipation. Genitourinary: Negative for dysuria. Musculoskeletal: Negative for back pain. Skin: Negative for rash. Neurological: Negative for headaches, focal weakness or numbness.   ____________________________________________   PHYSICAL EXAM:  VITAL SIGNS: ED Triage Vitals [06/05/17 2015]  Enc Vitals Group     BP 115/72     Pulse Rate 67     Resp 16     Temp 97.8 F (36.6 C)     Temp Source Oral     SpO2 98 %     Weight 130 lb (59 kg)     Height 5\' 8"  (1.727 m)     Head Circumference      Peak Flow      Pain Score 0     Pain Loc      Pain Edu?      Excl. in [place]?     Constitutional: Alert and oriented to self and location.  The patient knows that the month is December but does not know the day of the week or the date.  Cachectic appearing and in no acute distress. Eyes: Conjunctivae are normal. PERRL. EOMI. Head: Atraumatic. Nose: No congestion/rhinnorhea. Mouth/Throat: Mucous membranes are moist.  Oropharynx non-erythematous. Cardiovascular: Normal rate, regular rhythm. Grossly normal heart sounds.  Good peripheral circulation. Respiratory: Normal respiratory effort.  No retractions. Lungs CTAB. Gastrointestinal: Soft and nontender. No distention.  Positive bowel sounds Musculoskeletal: No lower extremity tenderness nor edema.   Neurologic:  Normal speech and language.  Skin:  Skin is warm, dry and intact.  Psychiatric: Mood and affect are normal.   ____________________________________________   LABS (all labs ordered are listed, but only abnormal results are displayed)  Labs Reviewed  CBC - Abnormal; Notable for the following components:      Result Value   WBC 12.4 (*)    RBC 2.86 (*)    Hemoglobin 8.7 (*)    HCT 26.6 (*)    RDW 15.6 (*)    All other components within normal limits  BASIC METABOLIC PANEL - Abnormal; Notable  for the following components:   Potassium 3.1 (*)    Glucose, Bld 226 (*)  Calcium 8.6 (*)    All other components within normal limits  TROPONIN I   ____________________________________________  EKG  ED ECG REPORT I, Loney Hering, the attending physician, personally viewed and interpreted this ECG.   Date: 06/06/2017  EKG Time: 214  Rate: 67  Rhythm: normal sinus rhythm  Axis: normal  Intervals:none  ST&T Change: none  ____________________________________________  RADIOLOGY  Dg Chest 2 View  Result Date: 06/06/2017 CLINICAL DATA:  Pancreatic cancer. EXAM: CHEST  2 VIEW COMPARISON:  June 02, 2017 FINDINGS: Bibasilar pulmonary infiltrates remain, mildly worsened in the interval. Based on the lateral view, the right middle lobe or lingula is also affected. A left PICC line terminates in the SVC. No pneumothorax. No other changes. Healed rib fractures. IMPRESSION: Worsening infiltrates. Electronically Signed   By: Dorise Bullion III M.D   On: 06/06/2017 03:39    ____________________________________________   PROCEDURES  Procedure(s) performed: None  Procedures  Critical Care performed: No  ____________________________________________   INITIAL IMPRESSION / ASSESSMENT AND PLAN / ED COURSE  As part of my medical decision making, I reviewed the following data within the electronic MEDICAL RECORD NUMBER Notes from prior ED visits and [place] Controlled Substance Database   This is a 56 year old male who comes into the hospital today stating that he needs a dose of his IV antibiotics because he missed his visit with a home health nurse.  The patient's family states that he was having chest pain.  Since there is some confusion as to the reason he is here we did check some blood work to include a CBC a BMP and a troponin.  We will also check an EKG and a chest x-ray.  I will also give the patient a dose of his vancomycin.  His dose is 750 mg IV every 12 hours.     The  patient received his dose of vancomycin.  His white blood cell count is improved from 2 days ago.  The patient's chest x-ray is mildly worse but again he is on antibiotics which she is supposed to receive through a PICC line.  The patient again denies any chest pain and denies ever having chest pain.  His troponin is less than 0.03.  He will be discharged home to follow-up with his physicians as he was supposed to have scheduled.  ____________________________________________   FINAL CLINICAL IMPRESSION(S) / ED DIAGNOSES  Final diagnoses:  Bacteremia  Aspiration pneumonia, unspecified aspiration pneumonia type, unspecified laterality, unspecified part of lung [facility])     ED Discharge Orders    None       Note:  This document was prepared using Dragon voice recognition software and may include unintentional dictation errors.    Loney Hering, MD 06/06/17 [phone]

## 2017-06-06 NOTE — Discharge Instructions (Signed)
These contact her home health nurse to learn how to dose and administer your vancomycin at home to your PICC line.  These return with any other concerns or questions.

## 2017-06-06 NOTE — ED Notes (Signed)

## 2017-06-07 LAB — CULTURE, BLOOD (ROUTINE X 2)
CULTURE: NO GROWTH
Culture: NO GROWTH
SPECIAL REQUESTS: ADEQUATE
SPECIAL REQUESTS: ADEQUATE

## 2017-06-09 ENCOUNTER — Other Ambulatory Visit: Payer: Self-pay

## 2017-06-09 ENCOUNTER — Emergency Department: Payer: Medicaid Other

## 2017-06-09 ENCOUNTER — Emergency Department
Admission: EM | Admit: 2017-06-09 | Discharge: 2017-06-10 | Disposition: A | Discharge status: Home / Self Care | Payer: Medicaid Other | Attending: Emergency Medicine | Admitting: Emergency Medicine

## 2017-06-09 ENCOUNTER — Encounter: Payer: Self-pay | Admitting: Emergency Medicine

## 2017-06-09 DIAGNOSIS — Z7982 Long term (current) use of aspirin: Secondary | ICD-10-CM | POA: Diagnosis not present

## 2017-06-09 DIAGNOSIS — F17210 Nicotine dependence, cigarettes, uncomplicated: Secondary | ICD-10-CM | POA: Insufficient documentation

## 2017-06-09 DIAGNOSIS — F0390 Unspecified dementia without behavioral disturbance: Secondary | ICD-10-CM | POA: Diagnosis not present

## 2017-06-09 DIAGNOSIS — Z794 Long term (current) use of insulin: Secondary | ICD-10-CM | POA: Insufficient documentation

## 2017-06-09 DIAGNOSIS — Z79899 Other long term (current) drug therapy: Secondary | ICD-10-CM | POA: Insufficient documentation

## 2017-06-09 DIAGNOSIS — F79 Unspecified intellectual disabilities: Secondary | ICD-10-CM | POA: Insufficient documentation

## 2017-06-09 DIAGNOSIS — Z046 Encounter for general psychiatric examination, requested by authority: Secondary | ICD-10-CM | POA: Diagnosis present

## 2017-06-09 DIAGNOSIS — E119 Type 2 diabetes mellitus without complications: Secondary | ICD-10-CM | POA: Diagnosis not present

## 2017-06-09 DIAGNOSIS — Z9119 Patient's noncompliance with other medical treatment and regimen: Secondary | ICD-10-CM | POA: Diagnosis not present

## 2017-06-09 DIAGNOSIS — D649 Anemia, unspecified: Secondary | ICD-10-CM | POA: Insufficient documentation

## 2017-06-09 DIAGNOSIS — E876 Hypokalemia: Secondary | ICD-10-CM | POA: Diagnosis not present

## 2017-06-09 LAB — BASIC METABOLIC PANEL
Anion gap: 7 (ref 5–15)
BUN: 11 mg/dL (ref 6–20)
CO2: 24 mmol/L (ref 22–32)
CREATININE: 1.06 mg/dL (ref 0.61–1.24)
Calcium: 8.5 mg/dL — ABNORMAL LOW (ref 8.9–10.3)
Chloride: 114 mmol/L — ABNORMAL HIGH (ref 101–111)
GFR calc Af Amer: >60 mL/min (ref >60)
GLUCOSE: 79 mg/dL (ref 65–99)
POTASSIUM: 2.9 mmol/L — AB (ref 3.5–5.1)
SODIUM: 145 mmol/L (ref 135–145)

## 2017-06-09 LAB — CBC
HCT: 25.7 % — ABNORMAL LOW (ref 40.0–52.0)
Hemoglobin: 8.5 g/dL — ABNORMAL LOW (ref 13.0–18.0)
MCH: 30.6 pg (ref 26.0–34.0)
MCHC: 33.1 g/dL (ref 32.0–36.0)
MCV: 92.3 fL (ref 80.0–100.0)
PLATELETS: 194 10*3/uL (ref 150–440)
RBC: 2.78 MIL/uL — AB (ref 4.40–5.90)
RDW: 15.4 % — ABNORMAL HIGH (ref 11.5–14.5)
WBC: 8.5 10*3/uL (ref 3.8–10.6)

## 2017-06-09 MED ORDER — POTASSIUM CHLORIDE CRYS ER 20 MEQ PO TBCR
60.0000 meq | EXTENDED_RELEASE_TABLET | Freq: Once | ORAL | Status: AC
  Administered 2017-06-10: 60 meq via ORAL
  Filled 2017-06-09: qty 3

## 2017-06-09 MED ORDER — VANCOMYCIN HCL 10 G IV SOLR
750.0000 mg | INTRAVENOUS | Status: DC
  Filled 2017-06-09: qty 750

## 2017-06-09 MED ORDER — VANCOMYCIN HCL IN DEXTROSE 750-5 MG/150ML-% IV SOLN
750.0000 mg | INTRAVENOUS | Status: AC
  Administered 2017-06-09: 750 mg via INTRAVENOUS
  Filled 2017-06-09: qty 150

## 2017-06-09 NOTE — ED Notes (Signed)
Patient assigned to appropriate care area. Patient oriented to unit/care area: Informed that, for their safety, care areas are designed for safety and monitored by security cameras at all times; and visiting hours explained to patient. Patient verbalizes understanding, and verbal contract for safety obtained. Pt denies SI and HI.
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
## 2017-06-09 NOTE — ED Triage Notes (Signed)
Pt to ED via EMS from home under IVC c/o refusing to take medications.  Per EMS patient recently discharged from hospital with dx of sepsis, PICC line in place and to take ABX at home.  EMS vitals 100% RA, 97 temp, 135/85 BP, 82 HR.  Pt A&Ox4, denies pain or SOB.

## 2017-06-09 NOTE — Discharge Instructions (Signed)
Today there is no evidence of residual pneumonia. Your chest x-ray labs and CT scan of the brain were all unremarkable. Full. Primary care doctor for continued monitoring of your condition and consideration of when the PICC line can be removed.

## 2017-06-09 NOTE — ED Notes (Signed)
Pt refusing to take PO medication.  MD made aware.  Pt seems scared, mumbling most words, and drooling some.  MD placed head CT order.

## 2017-06-10 ENCOUNTER — Other Ambulatory Visit: Payer: Self-pay

## 2017-06-10 LAB — CBC WITH DIFFERENTIAL/PLATELET
Basophils Absolute: 0 10*3/uL (ref 0–0.1)
Basophils Relative: 1 %
EOS ABS: 0.2 10*3/uL (ref 0–0.7)
Eosinophils Relative: 2 %
HEMATOCRIT: 27 % — AB (ref 40.0–52.0)
HEMOGLOBIN: 8.7 g/dL — AB (ref 13.0–18.0)
LYMPHS ABS: 2.1 10*3/uL (ref 1.0–3.6)
LYMPHS PCT: 24 %
MCH: 30 pg (ref 26.0–34.0)
MCHC: 32.3 g/dL (ref 32.0–36.0)
MCV: 92.8 fL (ref 80.0–100.0)
MONOS PCT: 7 %
Monocytes Absolute: 0.6 10*3/uL (ref 0.2–1.0)
NEUTROS ABS: 5.7 10*3/uL (ref 1.4–6.5)
NEUTROS PCT: 66 %
Platelets: 192 10*3/uL (ref 150–440)
RBC: 2.91 MIL/uL — AB (ref 4.40–5.90)
RDW: 15.2 % — ABNORMAL HIGH (ref 11.5–14.5)
WBC: 8.6 10*3/uL (ref 3.8–10.6)

## 2017-06-10 LAB — COMPREHENSIVE METABOLIC PANEL
ALBUMIN: 2.5 g/dL — AB (ref 3.5–5.0)
ALK PHOS: 154 U/L — AB (ref 38–126)
ALT: 21 U/L (ref 17–63)
ANION GAP: 3 — AB (ref 5–15)
AST: 38 U/L (ref 15–41)
BILIRUBIN TOTAL: 0.7 mg/dL (ref 0.3–1.2)
BUN: 9 mg/dL (ref 6–20)
CALCIUM: 8.6 mg/dL — AB (ref 8.9–10.3)
CO2: 26 mmol/L (ref 22–32)
CREATININE: 0.91 mg/dL (ref 0.61–1.24)
Chloride: 114 mmol/L — ABNORMAL HIGH (ref 101–111)
GFR calc non Af Amer: >60 mL/min (ref >60)
GLUCOSE: 102 mg/dL — AB (ref 65–99)
Potassium: 3.5 mmol/L (ref 3.5–5.1)
SODIUM: 143 mmol/L (ref 135–145)
TOTAL PROTEIN: 7.1 g/dL (ref 6.5–8.1)

## 2017-06-10 MED ORDER — POTASSIUM CHLORIDE CRYS ER 20 MEQ PO TBCR
EXTENDED_RELEASE_TABLET | ORAL | Status: AC
  Administered 2017-06-10: 60 meq via ORAL
  Filled 2017-06-10: qty 3

## 2017-06-10 NOTE — ED Notes (Signed)
Pt completed her [facility] consult and was placed back on 20H.

## 2017-06-10 NOTE — ED Notes (Signed)
Capers Hagmann RN ambulated the Pt to the bathroom. Curtis Fusselman RN helped the Pt back to his bed to wait for someone to pick him up.

## 2017-06-10 NOTE — ED Notes (Signed)
Khamryn Calderone RN ambulated the Pt to the bathroom. Neoma Uhrich RN helped the Pt back to his bed to wait for someone to pick him up.

## 2017-06-10 NOTE — ED Notes (Signed)
Breakfast tray given to patient.

## 2017-06-10 NOTE — ED Provider Notes (Addendum)
Patient with fall while in bathroom. No apparent injury. Exam reassuring  No evidence of head trauma No vertebral ttp Able to ambulate but unsteady No pain with rom of extremities No skin tear or laceration No abd ttp No sob   Will recheck labs given recent admission for encephalopathy releated to sepsis/infected line. Remains afebrile with no tachycardia and normal bp  ----------------------------------------- 8:55 AM on 06/10/2017 ----------------------------------------- Medical record reviewed. Patient does appear to be at his baseline after reading psychiatry consult note. Labs are reassuring and patient is sitting up eating breakfast and is well appearing overall.     Lavonia Drafts, MD 06/10/17 [phone]    Lavonia Drafts, MD 06/10/17 [phone]    Lavonia Drafts, MD 06/10/17 [phone]

## 2017-06-10 NOTE — ED Notes (Signed)
Lattie Haw, ED Tech helped the Pt ambulate to the bathroom. Pt returned to his bed without difficulty.   Calls have been made to [place] in order to obtain transportation for the Pt, but no one answered.

## 2017-06-10 NOTE — ED Notes (Signed)
Mallie Mussel RN called the Pt's daughter Jorene Minors [phone]) for them to pick up the Pt since he is being discharged and she said that she will call me back when she finds someone to pick him up.

## 2017-06-10 NOTE — ED Notes (Signed)
Patient trying to get out of bed encouraging patient stay in bed.

## 2017-06-10 NOTE — ED Notes (Addendum)
Pt assisted to bathroom by EDT, pt had incontinent episode of bowel. Was cleaned and given new clothes. Awaiting for family to transport her back.

## 2017-06-10 NOTE — ED Notes (Signed)
Kent Riendeau RN called [employer] [facility] to for them to pick up the Pt, but no one is answering the phone.

## 2017-06-10 NOTE — ED Notes (Signed)
Pt discharged home with Sister  Mariner, verified by ID. Pt transported via wheelchair with EDT Felicia.  All belongings given to patient at time of discharge.

## 2017-06-10 NOTE — ED Notes (Signed)
Per Dr. Corky Downs pt is stable for discharge.

## 2017-06-10 NOTE — ED Notes (Signed)
Patient walked to bathroom with edt [name] M. Patient gait was good just walking beside patient for comfort ,no report was given about patient being able to walk or not patient went in and ask  [name] M.  To step out he was fine stood outside door heard patient make a noise open door patient was sitting on his butt nurse Nicola Girt. And dr. Corky Downs was notified patient was walked back to bed.

## 2017-06-10 NOTE — ED Notes (Signed)
Mrs. [name] [phone]) called the ED and spoke with Mallie Mussel RN and Iris RN and stated that she will assume full responsibility for the Pt and that she would like the Pt to go back home in a cab. Pt agrees to the plan of sending him home on a cab. Charge nurse notified.

## 2017-06-10 NOTE — ED Notes (Signed)
Pt is actively trying to leave the ED. Pt was told by Mallie Mussel RN that he can not leave the ED by himself since he is not steady on his feet and he does not have anyone that can come to pick him up. Mallie Mussel RN called the Pt's daughter to see if she can pick him up and she stated that she is going to try to find someone.   Someone called the ED and spoke with Mallie Mussel RN and said that is the Pt's wife, states that she is also working on getting someone to pick up the Pt.

## 2017-06-10 NOTE — ED Notes (Signed)
Pt's mother called and reported that she was not able to find anyone to pick up the Pt.

## 2017-06-10 NOTE — ED Notes (Signed)
Mrs. [name] [phone]) called the ED and spoke with Mallie Mussel RN and Iris RN and stated that she will assume full responsibility for the Pt and that she would like the Pt to go back home in a cab. Pt agrees to the plan of sending him home on a cab. Charge nurse notified.

## 2017-06-10 NOTE — ED Notes (Signed)
Patient standing up in bed through side rails , ask patient to sit down and side back in bed , patient now laying down in bed.

## 2017-06-10 NOTE — ED Notes (Signed)
Pt given cup of orange juice

## 2017-06-10 NOTE — ED Notes (Signed)
Spoke with Marcie Bal, pt's aunt states she is on the way. Informed her all the paperwork was together the patient was ready to be picked up.

## 2017-06-10 NOTE — ED Notes (Addendum)
Attempted to call [facility]. No answer at this time.
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
## 2017-06-10 NOTE — ED Notes (Signed)
Lattie Haw, ED Tech helped the Pt ambulate to the bathroom. Pt returned to his bed without difficulty.

## 2017-06-10 NOTE — ED Notes (Signed)
Pt continues to try to get up and out of the bed, pt redirected to bed several times.  Awaiting lab work and plan for disposition.

## 2017-06-10 NOTE — ED Notes (Signed)
Waiting for family to arrive to take patient home.

## 2017-06-17 ENCOUNTER — Emergency Department
Admission: EM | Admit: 2017-06-17 | Discharge: 2017-06-17 | Disposition: A | Discharge status: Home / Self Care | Payer: Medicaid Other | Attending: Emergency Medicine | Admitting: Emergency Medicine

## 2017-06-17 ENCOUNTER — Encounter: Payer: Self-pay | Admitting: Emergency Medicine

## 2017-06-17 DIAGNOSIS — Z79899 Other long term (current) drug therapy: Secondary | ICD-10-CM | POA: Insufficient documentation

## 2017-06-17 DIAGNOSIS — Z794 Long term (current) use of insulin: Secondary | ICD-10-CM | POA: Insufficient documentation

## 2017-06-17 DIAGNOSIS — E119 Type 2 diabetes mellitus without complications: Secondary | ICD-10-CM | POA: Diagnosis not present

## 2017-06-17 DIAGNOSIS — Z7982 Long term (current) use of aspirin: Secondary | ICD-10-CM | POA: Diagnosis not present

## 2017-06-17 DIAGNOSIS — F17210 Nicotine dependence, cigarettes, uncomplicated: Secondary | ICD-10-CM | POA: Insufficient documentation

## 2017-06-17 DIAGNOSIS — E860 Dehydration: Secondary | ICD-10-CM | POA: Diagnosis not present

## 2017-06-17 DIAGNOSIS — G8929 Other chronic pain: Secondary | ICD-10-CM | POA: Diagnosis not present

## 2017-06-17 DIAGNOSIS — I959 Hypotension, unspecified: Secondary | ICD-10-CM | POA: Diagnosis present

## 2017-06-17 LAB — COMPREHENSIVE METABOLIC PANEL
ALT: 47 U/L (ref 17–63)
AST: 54 U/L — AB (ref 15–41)
Albumin: 2.5 g/dL — ABNORMAL LOW (ref 3.5–5.0)
Alkaline Phosphatase: 149 U/L — ABNORMAL HIGH (ref 38–126)
Anion gap: 4 — ABNORMAL LOW (ref 5–15)
BUN: 12 mg/dL (ref 6–20)
CHLORIDE: 119 mmol/L — AB (ref 101–111)
CO2: 18 mmol/L — AB (ref 22–32)
CREATININE: 0.96 mg/dL (ref 0.61–1.24)
Calcium: 8.9 mg/dL (ref 8.9–10.3)
GFR calc Af Amer: >60 mL/min (ref >60)
GFR calc non Af Amer: >60 mL/min (ref >60)
Glucose, Bld: 187 mg/dL — ABNORMAL HIGH (ref 65–99)
Potassium: 3.2 mmol/L — ABNORMAL LOW (ref 3.5–5.1)
SODIUM: 141 mmol/L (ref 135–145)
Total Bilirubin: 0.4 mg/dL (ref 0.3–1.2)
Total Protein: 6.9 g/dL (ref 6.5–8.1)

## 2017-06-17 LAB — CBC WITH DIFFERENTIAL/PLATELET
Basophils Absolute: 0 10*3/uL (ref 0–0.1)
Basophils Relative: 1 %
EOS ABS: 0.1 10*3/uL (ref 0–0.7)
EOS PCT: 1 %
HCT: 27.6 % — ABNORMAL LOW (ref 40.0–52.0)
HEMOGLOBIN: 8.8 g/dL — AB (ref 13.0–18.0)
LYMPHS ABS: 1.4 10*3/uL (ref 1.0–3.6)
Lymphocytes Relative: 20 %
MCH: 30.1 pg (ref 26.0–34.0)
MCHC: 31.9 g/dL — AB (ref 32.0–36.0)
MCV: 94.3 fL (ref 80.0–100.0)
MONOS PCT: 3 %
Monocytes Absolute: 0.2 10*3/uL (ref 0.2–1.0)
Neutro Abs: 5.1 10*3/uL (ref 1.4–6.5)
Neutrophils Relative %: 75 %
PLATELETS: 167 10*3/uL (ref 150–440)
RBC: 2.93 MIL/uL — ABNORMAL LOW (ref 4.40–5.90)
RDW: 15.4 % — ABNORMAL HIGH (ref 11.5–14.5)
WBC: 6.8 10*3/uL (ref 3.8–10.6)

## 2017-06-17 LAB — LACTIC ACID, PLASMA: LACTIC ACID, VENOUS: 1.7 mmol/L (ref 0.5–1.9)

## 2017-06-17 MED ORDER — SODIUM CHLORIDE 0.9 % IV BOLUS (SEPSIS)
1000.0000 mL | Freq: Once | INTRAVENOUS | Status: AC
  Administered 2017-06-17: 1000 mL via INTRAVENOUS

## 2017-06-17 MED ORDER — POTASSIUM CHLORIDE CRYS ER 20 MEQ PO TBCR
40.0000 meq | EXTENDED_RELEASE_TABLET | Freq: Once | ORAL | Status: AC
  Administered 2017-06-17: 40 meq via ORAL
  Filled 2017-06-17: qty 2

## 2017-06-17 NOTE — ED Provider Notes (Signed)
[facility] Emergency Department Provider Note  ____________________________________________   I have reviewed the triage vital signs and the nursing notes.   HISTORY  Chief Complaint Hypotension    History limited by: Not Limited   HPI [name] is a 56 y.o. male who presents to the emergency department today from PCP because of hypotension.  DURATION:today SEVERITY: BP 66/44 standing CONTEXT: patient with history of colon cancer, recent admission for sepsis secondary to portocath infection. Patient states he feels better but at follow up appointment was found to be hypotensive. MODIFYING FACTORS: none ASSOCIATED SYMPTOMS: patient has some weakness. Denies fevers. Denies chest pain. Denies shortness of breath.  Per medical record review patient has a history of recent admission for sepsis.   Past Medical History:  Diagnosis Date  . Anemia   . BPH without obstruction/lower urinary tract symptoms   . Cancer ([place])   . Cellulitis of scrotum   . Cyst of prostate 05/13/2015  . Diabetes ([place])   . Difficulty urinating   . Dyspnea   . Epididymoorchitis   . ETOH abuse   . Fracture of right tibia and fibula   . GERD (gastroesophageal reflux disease)   . GI bleed   . Malignant neoplasm of sigmoid colon ([place])   . Pancreatitis   . Trichomoniasis   . Wears dentures    Partial upper and lower.  reports poor fit.    Patient Active Problem List   Diagnosis Date Noted  . 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 Sepsis ([place]) 05/28/2017  . Local infection due to central venous catheter   . Tobacco use disorder 04/21/2017  . 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 Moderate malnutrition ([place]) 02/12/2017  . Pancreatic insufficiency 02/12/2017  . Chronic abdominal pain 01/29/2017  . Malignant neoplasm of sigmoid colon ([place]) 01/15/2017  . Shock ([place]) 12/02/2016  . Acute posthemorrhagic anemia   . Benign neoplasm of ascending  colon   . Colorectal polyps   . Noninfectious diarrhea   . Syncope 12/01/2016  . Chronic diarrhea 12/01/2016  . Pancreatitis 04/17/2016  . Noncompliance 07/05/2015  . Tobacco dependence 07/05/2015  . Fracture of right tibia and fibula 06/05/2015  . Type 2 diabetes mellitus with hyperglycemia ([place]) 06/05/2015  . Prostatic cyst 05/13/2015  . GI bleed 05/12/2015  . Type 2 diabetes mellitus ([place]) 05/12/2015  . GERD (gastroesophageal reflux disease) 05/12/2015  . ETOH abuse 05/12/2015  . BPH (benign prostatic hyperplasia) 05/12/2015    Past Surgical History:  Procedure Laterality Date  . COLONOSCOPY WITH PROPOFOL N/A 12/02/2016   Procedure: COLONOSCOPY WITH PROPOFOL;  Surgeon: Lucilla Lame, MD;  Location: [facility] ENDOSCOPY;  Service: Endoscopy;  Laterality: N/A;  . ESOPHAGOGASTRODUODENOSCOPY (EGD) WITH PROPOFOL N/A 12/02/2016   Procedure: ESOPHAGOGASTRODUODENOSCOPY (EGD) WITH PROPOFOL;  Surgeon: Lucilla Lame, MD;  Location: [facility] ENDOSCOPY;  Service: Endoscopy;  Laterality: N/A;  . FLEXIBLE SIGMOIDOSCOPY N/A 02/05/2017   Procedure: FLEXIBLE SIGMOIDOSCOPY;  Surgeon: Lucilla Lame, MD;  Location: [place];  Service: Gastroenterology;  Laterality: N/A;  Needs labs drawn. Needs to come in early.  No anesthesia  . FRACTURE SURGERY     TIBIA AND FIBULA  . FRACTURE SURGERY    . LAPAROSCOPIC PARTIAL COLECTOMY  04/17/2017   [facility]  . PORT-A-CATH REMOVAL N/A 05/29/2017   Procedure: REMOVAL PORT-A-CATH;  Surgeon: Vickie Epley, MD;  Location: [facility] ORS;  Service: Vascular;  Laterality: N/A;  . PORTACATH PLACEMENT Right 05/18/2017   Procedure: INSERTION PORT-A-CATH;  Surgeon: Vickie Epley, MD;  Location: [facility] ORS;  Service: Vascular;  Laterality: Right;  . resection of pancreas    . SCROTAL EXPLORATION    . TEE WITHOUT CARDIOVERSION N/A 06/04/2017   Procedure: TRANSESOPHAGEAL ECHOCARDIOGRAM (TEE);  Surgeon: Corey Skains, MD;  Location: [facility] ORS;  Service: Cardiovascular;  Laterality:  N/A;    Prior to Admission medications   Medication Sig Start Date End Date Taking? Authorizing Provider  amitriptyline (ELAVIL) 25 MG tablet Take 25 mg by mouth. 02/17/17   [provider]  aspirin EC 81 MG tablet Take 81 mg by mouth. 07/20/15   [provider]  B-D UF III MINI PEN NEEDLES 31G X 5 MM MISC  10/17/16   [provider]  Blood Glucose Monitoring Suppl (GLUCOCOM BLOOD GLUCOSE MONITOR) DEVI use as directed 08/13/15   [provider]  cyclobenzaprine (FLEXERIL) 10 MG tablet Take 10 mg by mouth 2 (two) times daily. 02/15/16   [provider]  diphenoxylate-atropine (LOMOTIL) 2.5-0.025 MG tablet take 1 tablet four times a day if needed 01/01/17   [provider]  LANTUS SOLOSTAR 100 UNIT/ML Solostar Pen Inject 10 Units into the skin 2 (two) times daily. Patient taking differently: Inject 10 Units into the skin daily at 10 pm.  07/02/16   Harvest Dark, MD  lidocaine-prilocaine (EMLA) cream Apply over porta cath 1-2 hours prior to chemo. 05/26/17   Earlie Server, MD  lipase/protease/amylase (CREON) 36000 UNITS CPEP capsule Take 1 capsule (36,000 Units total) 3 (three) times daily before meals by mouth. 05/05/17   Earlie Server, MD  midodrine (PROAMATINE) 5 MG tablet Take 1 tablet (5 mg total) by mouth 3 (three) times daily with meals. 06/04/17   Dustin Flock, MD  NOVOLOG FLEXPEN 100 UNIT/ML FlexPen Inject 48 Units into the skin 3 (three) times daily with meals.  05/16/15   [provider]  ondansetron (ZOFRAN) 8 MG tablet Take 1 tablet (8 mg total) by mouth every 8 (eight) hours as needed for nausea or vomiting. 05/26/17   Earlie Server, MD  Oxycodone HCl 10 MG TABS Take 1 tablet (10 mg total) every 6 (six) hours as needed by mouth. 05/18/17   Earlie Server, MD  prochlorperazine (COMPAZINE) 10 MG tablet Take 1 tablet (10 mg total) by mouth every 6 (six) hours as needed for nausea or vomiting. 05/26/17   Earlie Server, MD  RA ACETAMINOPHEN 325 MG tablet  take 2 tablets by mouth every 6 hours 04/21/17   [provider]  traZODone (DESYREL) 50 MG tablet take 1 tablet by mouth at bedtime TO HELP WITH SLEEP-PRN 12/05/16   [provider]  Vancomycin (VANCOCIN) 750-5 MG/150ML-% SOLN Inject 150 mLs (750 mg total) into the vein every 12 (twelve) hours. 06/04/17   Dustin Flock, MD  Vitamins/Minerals TABS Take 1 tablet every morning by mouth.  05/14/15   [provider]    Allergies Aspirin  Family History  Problem Relation Age of Onset  . Cancer - Colon Father   . Colon cancer Brother     Social History Social History   Tobacco Use  . Smoking status: Current Every Day Smoker    Packs/day: 0.25    Years: 41.00    Pack years: 10.25    Types: Cigarettes  . Smokeless tobacco: Never Used  . Tobacco comment: since age 60  Substance Use Topics  . Alcohol use: Yes    Alcohol/week: 0.0 oz    Comment: occasionally - but says not  drinkning currently  . Drug use: No    Review of Systems Constitutional: No fever/chills. Positive for weakness. Eyes: No visual changes. ENT: No sore throat. Cardiovascular: Denies chest pain. Respiratory: Denies shortness of breath. Gastrointestinal: No abdominal pain.  No nausea, no vomiting.  No diarrhea.   Genitourinary: Negative for dysuria. Musculoskeletal: Negative for back pain. Skin: Negative for rash. Neurological: Negative for headaches, focal weakness or numbness.  ____________________________________________   PHYSICAL EXAM:  VITAL SIGNS: ED Triage Vitals [06/17/17 1246]  Enc Vitals Group     BP (!) 86/64     Pulse Rate (!) 59     Resp 16     Temp      Temp src      SpO2 100 %     Weight 109 lb (49.4 kg)     Height 5\' 8"  (1.727 m)   Constitutional: Alert and oriented. Well appearing and in no distress. Eyes: Conjunctivae are normal.  ENT   Head: Normocephalic and atraumatic.   Nose: No congestion/rhinnorhea.   Mouth/Throat: Mucous membranes are  moist.   Neck: No stridor. Hematological/Lymphatic/Immunilogical: No cervical lymphadenopathy. Cardiovascular: Normal rate, regular rhythm.  No murmurs, rubs, or gallops.  Respiratory: Normal respiratory effort without tachypnea nor retractions. Breath sounds are clear and equal bilaterally. No wheezes/rales/rhonchi. Gastrointestinal: Soft and non tender. No rebound. No guarding.  Genitourinary: Deferred Musculoskeletal: Normal range of motion in all extremities. No lower extremity edema. Neurologic:  Normal speech and language. No gross focal neurologic deficits are appreciated.  Skin:  Skin is warm, dry and intact. No rash noted. Psychiatric: Mood and affect are normal. Speech and behavior are normal. Patient exhibits appropriate insight and judgment.  ____________________________________________    LABS (pertinent positives/negatives)  Lactic acid 1.7 CMP glu 187, cr 0.96 CBC hgb 8.8  ____________________________________________   EKG  None  ____________________________________________    RADIOLOGY  None  ____________________________________________   PROCEDURES  Procedures  ____________________________________________   INITIAL IMPRESSION / ASSESSMENT AND PLAN / ED COURSE  Pertinent labs & imaging results that were available during my care of the patient were reviewed by me and considered in my medical decision making (see chart for details).  Patient presents to the emergency department today because of concerns for low blood pressure.  Differential would be broad including dehydration, hypothyroidism, infection among other etiologies.  No signs of infection on the blood work.  Patient himself states he feels fine.  Blood pressure did improve after IV fluids.  At this point I think dehydration likely.  Discussed this with the patient.  ____________________________________________   FINAL CLINICAL IMPRESSION(S) / ED DIAGNOSES  Final diagnoses:   Dehydration     Note: This dictation was prepared with Dragon dictation. Any transcriptional errors that result from this process are unintentional     Nance Pear, MD 06/17/17 [phone]

## 2017-06-17 NOTE — ED Notes (Addendum)
Family at bedside. NAD

## 2017-06-17 NOTE — ED Notes (Signed)
First RN note:  Dr. Quay Burow sent patient for hypotension reading 77/42 and 66/44.  Patient recently has been undergoing abx treatment for an infected PAC.  Patient is being treated for colon cancer as well with significant h/o sepsis.

## 2017-06-17 NOTE — ED Notes (Signed)
Pt given sandwich tray. Ambulated to restroom with RN

## 2017-06-17 NOTE — ED Notes (Signed)
Pt was at PCP for routine check up and found to be hypotensive. Pt denies complaints other than neck pain at this time. No fevers. Currently on abx, no chemo. No vomiting but has had some diarrhea last couple days. Appears malnourished, has had weight loss.

## 2017-06-17 NOTE — Discharge Instructions (Signed)
Please seek medical attention for any high fevers, chest pain, shortness of breath, change in behavior, persistent vomiting, bloody stool or any other new or concerning symptoms.  

## 2017-06-17 NOTE — ED Notes (Signed)
MD at bedside to update pt

## 2017-06-17 NOTE — ED Triage Notes (Signed)
Patient presents to ED via POV from [facility] with c/o hypotension. Patient was recently hospitalized for portacath infection. MAP in triage is 68.

## 2017-06-22 ENCOUNTER — Emergency Department: Payer: Medicaid Other

## 2017-06-22 ENCOUNTER — Emergency Department (HOSPITAL_COMMUNITY): Payer: Medicaid Other

## 2017-06-22 ENCOUNTER — Inpatient Hospital Stay
Admission: EM | Admit: 2017-06-22 | Discharge: 2017-07-02 | DRG: 870 | Disposition: A | Discharge status: Other Acute Inpatient Hospital | Payer: Medicaid Other | Attending: Internal Medicine | Admitting: Internal Medicine

## 2017-06-22 ENCOUNTER — Inpatient Hospital Stay: Payer: Medicaid Other

## 2017-06-22 ENCOUNTER — Encounter: Payer: Self-pay | Admitting: Emergency Medicine

## 2017-06-22 DIAGNOSIS — Y95 Nosocomial condition: Secondary | ICD-10-CM | POA: Diagnosis present

## 2017-06-22 DIAGNOSIS — C20 Malignant neoplasm of rectum: Secondary | ICD-10-CM | POA: Diagnosis present

## 2017-06-22 DIAGNOSIS — E11641 Type 2 diabetes mellitus with hypoglycemia with coma: Secondary | ICD-10-CM | POA: Diagnosis present

## 2017-06-22 DIAGNOSIS — Z978 Presence of other specified devices: Secondary | ICD-10-CM

## 2017-06-22 DIAGNOSIS — E43 Unspecified severe protein-calorie malnutrition: Secondary | ICD-10-CM | POA: Diagnosis not present

## 2017-06-22 DIAGNOSIS — Z452 Encounter for adjustment and management of vascular access device: Secondary | ICD-10-CM

## 2017-06-22 DIAGNOSIS — R402112 Coma scale, eyes open, never, at arrival to emergency department: Secondary | ICD-10-CM | POA: Diagnosis present

## 2017-06-22 DIAGNOSIS — E878 Other disorders of electrolyte and fluid balance, not elsewhere classified: Secondary | ICD-10-CM

## 2017-06-22 DIAGNOSIS — K861 Other chronic pancreatitis: Secondary | ICD-10-CM | POA: Diagnosis present

## 2017-06-22 DIAGNOSIS — E11649 Type 2 diabetes mellitus with hypoglycemia without coma: Secondary | ICD-10-CM | POA: Diagnosis present

## 2017-06-22 DIAGNOSIS — R4182 Altered mental status, unspecified: Secondary | ICD-10-CM | POA: Diagnosis not present

## 2017-06-22 DIAGNOSIS — E872 Acidosis: Secondary | ICD-10-CM | POA: Diagnosis present

## 2017-06-22 DIAGNOSIS — R05 Cough: Secondary | ICD-10-CM

## 2017-06-22 DIAGNOSIS — N390 Urinary tract infection, site not specified: Secondary | ICD-10-CM | POA: Diagnosis present

## 2017-06-22 DIAGNOSIS — J9600 Acute respiratory failure, unspecified whether with hypoxia or hypercapnia: Secondary | ICD-10-CM | POA: Diagnosis not present

## 2017-06-22 DIAGNOSIS — Z01818 Encounter for other preprocedural examination: Secondary | ICD-10-CM

## 2017-06-22 DIAGNOSIS — R40243 Glasgow coma scale score 3-8, unspecified time: Secondary | ICD-10-CM | POA: Diagnosis not present

## 2017-06-22 DIAGNOSIS — D62 Acute posthemorrhagic anemia: Secondary | ICD-10-CM | POA: Diagnosis present

## 2017-06-22 DIAGNOSIS — R680 Hypothermia, not associated with low environmental temperature: Secondary | ICD-10-CM | POA: Diagnosis not present

## 2017-06-22 DIAGNOSIS — Z90411 Acquired partial absence of pancreas: Secondary | ICD-10-CM

## 2017-06-22 DIAGNOSIS — R001 Bradycardia, unspecified: Secondary | ICD-10-CM | POA: Diagnosis present

## 2017-06-22 DIAGNOSIS — Z85048 Personal history of other malignant neoplasm of rectum, rectosigmoid junction, and anus: Secondary | ICD-10-CM

## 2017-06-22 DIAGNOSIS — R402212 Coma scale, best verbal response, none, at arrival to emergency department: Secondary | ICD-10-CM | POA: Diagnosis present

## 2017-06-22 DIAGNOSIS — T85598A Other mechanical complication of other gastrointestinal prosthetic devices, implants and grafts, initial encounter: Secondary | ICD-10-CM

## 2017-06-22 DIAGNOSIS — R059 Cough, unspecified: Secondary | ICD-10-CM

## 2017-06-22 DIAGNOSIS — R579 Shock, unspecified: Secondary | ICD-10-CM | POA: Diagnosis present

## 2017-06-22 DIAGNOSIS — D649 Anemia, unspecified: Secondary | ICD-10-CM | POA: Diagnosis not present

## 2017-06-22 DIAGNOSIS — F17210 Nicotine dependence, cigarettes, uncomplicated: Secondary | ICD-10-CM | POA: Diagnosis present

## 2017-06-22 DIAGNOSIS — E870 Hyperosmolality and hypernatremia: Secondary | ICD-10-CM | POA: Diagnosis present

## 2017-06-22 DIAGNOSIS — D696 Thrombocytopenia, unspecified: Secondary | ICD-10-CM | POA: Diagnosis present

## 2017-06-22 DIAGNOSIS — G9341 Metabolic encephalopathy: Secondary | ICD-10-CM | POA: Diagnosis present

## 2017-06-22 DIAGNOSIS — E876 Hypokalemia: Secondary | ICD-10-CM | POA: Diagnosis present

## 2017-06-22 DIAGNOSIS — R402312 Coma scale, best motor response, none, at arrival to emergency department: Secondary | ICD-10-CM | POA: Diagnosis present

## 2017-06-22 DIAGNOSIS — R402431 Glasgow coma scale score 3-8, in the field [EMT or ambulance]: Secondary | ICD-10-CM | POA: Diagnosis not present

## 2017-06-22 DIAGNOSIS — J189 Pneumonia, unspecified organism: Secondary | ICD-10-CM | POA: Diagnosis not present

## 2017-06-22 DIAGNOSIS — A419 Sepsis, unspecified organism: Principal | ICD-10-CM | POA: Diagnosis present

## 2017-06-22 DIAGNOSIS — E860 Dehydration: Secondary | ICD-10-CM | POA: Diagnosis present

## 2017-06-22 DIAGNOSIS — J9601 Acute respiratory failure with hypoxia: Secondary | ICD-10-CM | POA: Diagnosis not present

## 2017-06-22 DIAGNOSIS — D6489 Other specified anemias: Secondary | ICD-10-CM | POA: Diagnosis present

## 2017-06-22 DIAGNOSIS — Z886 Allergy status to analgesic agent status: Secondary | ICD-10-CM

## 2017-06-22 DIAGNOSIS — R197 Diarrhea, unspecified: Secondary | ICD-10-CM | POA: Diagnosis not present

## 2017-06-22 DIAGNOSIS — J9690 Respiratory failure, unspecified, unspecified whether with hypoxia or hypercapnia: Secondary | ICD-10-CM | POA: Diagnosis present

## 2017-06-22 DIAGNOSIS — L8990 Pressure ulcer of unspecified site, unspecified stage: Secondary | ICD-10-CM | POA: Diagnosis present

## 2017-06-22 DIAGNOSIS — J690 Pneumonitis due to inhalation of food and vomit: Secondary | ICD-10-CM | POA: Diagnosis present

## 2017-06-22 DIAGNOSIS — N179 Acute kidney failure, unspecified: Secondary | ICD-10-CM | POA: Diagnosis present

## 2017-06-22 DIAGNOSIS — R1310 Dysphagia, unspecified: Secondary | ICD-10-CM | POA: Diagnosis present

## 2017-06-22 DIAGNOSIS — C189 Malignant neoplasm of colon, unspecified: Secondary | ICD-10-CM | POA: Diagnosis present

## 2017-06-22 DIAGNOSIS — R6520 Severe sepsis without septic shock: Secondary | ICD-10-CM | POA: Diagnosis present

## 2017-06-22 DIAGNOSIS — K219 Gastro-esophageal reflux disease without esophagitis: Secondary | ICD-10-CM | POA: Diagnosis present

## 2017-06-22 DIAGNOSIS — E162 Hypoglycemia, unspecified: Secondary | ICD-10-CM | POA: Diagnosis not present

## 2017-06-22 DIAGNOSIS — G9340 Encephalopathy, unspecified: Secondary | ICD-10-CM | POA: Diagnosis not present

## 2017-06-22 DIAGNOSIS — Z79899 Other long term (current) drug therapy: Secondary | ICD-10-CM

## 2017-06-22 DIAGNOSIS — R400 Somnolence: Secondary | ICD-10-CM | POA: Diagnosis not present

## 2017-06-22 DIAGNOSIS — Z794 Long term (current) use of insulin: Secondary | ICD-10-CM

## 2017-06-22 DIAGNOSIS — Z4659 Encounter for fitting and adjustment of other gastrointestinal appliance and device: Secondary | ICD-10-CM

## 2017-06-22 LAB — COMPREHENSIVE METABOLIC PANEL
ALBUMIN: 2.1 g/dL — AB (ref 3.5–5.0)
ALT: 32 U/L (ref 17–63)
AST: 30 U/L (ref 15–41)
Alkaline Phosphatase: 93 U/L (ref 38–126)
Anion gap: UNDETERMINED (ref 5–15)
BILIRUBIN TOTAL: 0.5 mg/dL (ref 0.3–1.2)
BUN: 19 mg/dL (ref 6–20)
CO2: 14 mmol/L — ABNORMAL LOW (ref 22–32)
Calcium: 8.9 mg/dL (ref 8.9–10.3)
Creatinine, Ser: 1.08 mg/dL (ref 0.61–1.24)
GFR calc Af Amer: >60 mL/min (ref >60)
GFR calc non Af Amer: >60 mL/min (ref >60)
GLUCOSE: 103 mg/dL — AB (ref 65–99)
POTASSIUM: 2.9 mmol/L — AB (ref 3.5–5.1)
Sodium: 145 mmol/L (ref 135–145)
TOTAL PROTEIN: 5.8 g/dL — AB (ref 6.5–8.1)

## 2017-06-22 LAB — BASIC METABOLIC PANEL
ANION GAP: 1 — AB (ref 5–15)
ANION GAP: 1 — AB (ref 5–15)
BUN: 15 mg/dL (ref 6–20)
BUN: 14 mg/dL (ref 6–20)
CHLORIDE: 129 mmol/L — AB (ref 101–111)
CHLORIDE: 127 mmol/L — AB (ref 101–111)
CO2: 10 mmol/L — AB (ref 22–32)
CO2: 12 mmol/L — ABNORMAL LOW (ref 22–32)
Calcium: 7.5 mg/dL — ABNORMAL LOW (ref 8.9–10.3)
Calcium: 7.6 mg/dL — ABNORMAL LOW (ref 8.9–10.3)
Creatinine, Ser: 0.94 mg/dL (ref 0.61–1.24)
Creatinine, Ser: 0.97 mg/dL (ref 0.61–1.24)
GFR calc Af Amer: >60 mL/min (ref >60)
GFR calc Af Amer: >60 mL/min (ref >60)
GLUCOSE: 116 mg/dL — AB (ref 65–99)
GLUCOSE: 84 mg/dL (ref 65–99)
POTASSIUM: 2.9 mmol/L — AB (ref 3.5–5.1)
POTASSIUM: 3.2 mmol/L — AB (ref 3.5–5.1)
Sodium: 140 mmol/L (ref 135–145)
Sodium: 140 mmol/L (ref 135–145)

## 2017-06-22 LAB — URINALYSIS, ROUTINE W REFLEX MICROSCOPIC
Bilirubin Urine: NEGATIVE
GLUCOSE, UA: 150 mg/dL — AB
HGB URINE DIPSTICK: NEGATIVE
Ketones, ur: NEGATIVE mg/dL
NITRITE: NEGATIVE
Protein, ur: 30 mg/dL — AB
SPECIFIC GRAVITY, URINE: 1.014 (ref 1.005–1.030)
pH: 5 (ref 5.0–8.0)

## 2017-06-22 LAB — GLUCOSE, CAPILLARY
GLUCOSE-CAPILLARY: 66 mg/dL (ref 65–99)
GLUCOSE-CAPILLARY: 70 mg/dL (ref 65–99)
GLUCOSE-CAPILLARY: 91 mg/dL (ref 65–99)
Glucose-Capillary: 107 mg/dL — ABNORMAL HIGH (ref 65–99)
Glucose-Capillary: 114 mg/dL — ABNORMAL HIGH (ref 65–99)
Glucose-Capillary: 132 mg/dL — ABNORMAL HIGH (ref 65–99)
Glucose-Capillary: 125 mg/dL — ABNORMAL HIGH (ref 65–99)
Glucose-Capillary: 102 mg/dL — ABNORMAL HIGH (ref 65–99)

## 2017-06-22 LAB — CBC WITH DIFFERENTIAL/PLATELET
BASOS ABS: 0 10*3/uL (ref 0–0.1)
BASOS PCT: 0 %
Basophils Absolute: 0 10*3/uL (ref 0–0.1)
Basophils Relative: 0 %
EOS PCT: 0 %
Eosinophils Absolute: 0 10*3/uL (ref 0–0.7)
Eosinophils Absolute: 0 10*3/uL (ref 0–0.7)
Eosinophils Relative: 0 %
HCT: 23.4 % — ABNORMAL LOW (ref 40.0–52.0)
HEMATOCRIT: 21.1 % — AB (ref 40.0–52.0)
HEMOGLOBIN: 6.5 g/dL — AB (ref 13.0–18.0)
Hemoglobin: 7.3 g/dL — ABNORMAL LOW (ref 13.0–18.0)
LYMPHS ABS: 1 10*3/uL (ref 1.0–3.6)
LYMPHS PCT: 7 %
Lymphocytes Relative: 7 %
Lymphs Abs: 0.9 10*3/uL — ABNORMAL LOW (ref 1.0–3.6)
MCH: 29.5 pg (ref 26.0–34.0)
MCH: 29.1 pg (ref 26.0–34.0)
MCHC: 31.2 g/dL — ABNORMAL LOW (ref 32.0–36.0)
MCHC: 30.8 g/dL — ABNORMAL LOW (ref 32.0–36.0)
MCV: 94.3 fL (ref 80.0–100.0)
MCV: 94.7 fL (ref 80.0–100.0)
MONO ABS: 0.4 10*3/uL (ref 0.2–1.0)
MONO ABS: 0.5 10*3/uL (ref 0.2–1.0)
Monocytes Relative: 3 %
Monocytes Relative: 4 %
NEUTROS ABS: 11.5 10*3/uL — AB (ref 1.4–6.5)
NEUTROS PCT: 89 %
Neutro Abs: 12.5 10*3/uL — ABNORMAL HIGH (ref 1.4–6.5)
Neutrophils Relative %: 90 %
PLATELETS: 98 10*3/uL — AB (ref 150–440)
Platelets: 82 10*3/uL — ABNORMAL LOW (ref 150–440)
RBC: 2.49 MIL/uL — ABNORMAL LOW (ref 4.40–5.90)
RBC: 2.22 MIL/uL — ABNORMAL LOW (ref 4.40–5.90)
RDW: 15.9 % — AB (ref 11.5–14.5)
RDW: 16.4 % — AB (ref 11.5–14.5)
WBC: 13.9 10*3/uL — ABNORMAL HIGH (ref 3.8–10.6)
WBC: 12.9 10*3/uL — ABNORMAL HIGH (ref 3.8–10.6)

## 2017-06-22 LAB — TSH: TSH: 5.612 u[IU]/mL — ABNORMAL HIGH (ref 0.350–4.500)

## 2017-06-22 LAB — ACETAMINOPHEN LEVEL

## 2017-06-22 LAB — BLOOD GAS, ARTERIAL
Acid-base deficit: 15.9 mmol/L — ABNORMAL HIGH (ref 0.0–2.0)
Bicarbonate: 10.1 mmol/L — ABNORMAL LOW (ref 20.0–28.0)
FIO2: 0.4
O2 SAT: 99.7 %
PCO2 ART: 24 mmHg — AB (ref 32.0–48.0)
PEEP: 5 cmH2O
PO2 ART: 233 mmHg — AB (ref 83.0–108.0)
Patient temperature: 37
RATE: 14 resp/min
VT: 500 mL
pH, Arterial: 7.23 — ABNORMAL LOW (ref 7.350–7.450)

## 2017-06-22 LAB — PROCALCITONIN: PROCALCITONIN: 0.11 ng/mL

## 2017-06-22 LAB — CHLORIDE, URINE, RANDOM: CHLORIDE URINE: 139 mmol/L

## 2017-06-22 LAB — TROPONIN I: Troponin I: <0.03 ng/mL (ref <0.03)

## 2017-06-22 LAB — URINE DRUG SCREEN, QUALITATIVE (ARMC ONLY)
AMPHETAMINES, UR SCREEN: NOT DETECTED
BENZODIAZEPINE, UR SCRN: NOT DETECTED
Barbiturates, Ur Screen: NOT DETECTED
Cannabinoid 50 Ng, Ur ~~LOC~~: NOT DETECTED
Cocaine Metabolite,Ur ~~LOC~~: NOT DETECTED
MDMA (ECSTASY) UR SCREEN: NOT DETECTED
METHADONE SCREEN, URINE: NOT DETECTED
Opiate, Ur Screen: NOT DETECTED
PHENCYCLIDINE (PCP) UR S: NOT DETECTED
TRICYCLIC, UR SCREEN: NOT DETECTED

## 2017-06-22 LAB — LACTIC ACID, PLASMA
LACTIC ACID, VENOUS: 1.1 mmol/L (ref 0.5–1.9)
LACTIC ACID, VENOUS: 1 mmol/L (ref 0.5–1.9)
Lactic Acid, Venous: 0.8 mmol/L (ref 0.5–1.9)

## 2017-06-22 LAB — OSMOLALITY, URINE: OSMOLALITY UR: 388 mosm/kg (ref 300–900)

## 2017-06-22 LAB — PROTIME-INR
INR: 1.32
Prothrombin Time: 16.3 seconds — ABNORMAL HIGH (ref 11.4–15.2)

## 2017-06-22 LAB — LIPASE, BLOOD: LIPASE: 12 U/L (ref 11–51)

## 2017-06-22 LAB — PREPARE RBC (CROSSMATCH)

## 2017-06-22 LAB — APTT: APTT: 43 s — AB (ref 24–36)

## 2017-06-22 LAB — MAGNESIUM: Magnesium: 1.3 mg/dL — ABNORMAL LOW (ref 1.7–2.4)

## 2017-06-22 LAB — MRSA PCR SCREENING: MRSA by PCR: NEGATIVE

## 2017-06-22 LAB — SODIUM, URINE, RANDOM: Sodium, Ur: 108 mmol/L

## 2017-06-22 LAB — ETHANOL: Alcohol, Ethyl (B): <10 mg/dL (ref <10)

## 2017-06-22 LAB — SALICYLATE LEVEL

## 2017-06-22 MED ORDER — SODIUM CHLORIDE 0.9 % IV BOLUS (SEPSIS)
500.0000 mL | Freq: Once | INTRAVENOUS | Status: DC
  Administered 2017-06-22: 500 mL via INTRAVENOUS

## 2017-06-22 MED ORDER — SODIUM CHLORIDE 0.9 % IV BOLUS (SEPSIS)
1000.0000 mL | Freq: Once | INTRAVENOUS | Status: AC
  Administered 2017-06-22: 1000 mL via INTRAVENOUS

## 2017-06-22 MED ORDER — FENTANYL CITRATE (PF) 2500 MCG/50ML IJ SOLN
0.0000 ug/h | INTRAMUSCULAR | Status: DC
  Filled 2017-06-22: qty 50

## 2017-06-22 MED ORDER — PIPERACILLIN-TAZOBACTAM 3.375 G IVPB
3.3750 g | Freq: Three times a day (TID) | INTRAVENOUS | Status: DC
  Administered 2017-06-22 – 2017-06-24 (×5): 3.375 g via INTRAVENOUS
  Filled 2017-06-22 (×5): qty 50

## 2017-06-22 MED ORDER — SODIUM CHLORIDE 0.9 % IV BOLUS (SEPSIS)
500.0000 mL | Freq: Once | INTRAVENOUS | Status: AC
  Administered 2017-06-22: 500 mL via INTRAVENOUS

## 2017-06-22 MED ORDER — DEXTROSE 50 % IV SOLN
INTRAVENOUS | Status: AC
  Administered 2017-06-22: 25 mL via INTRAVENOUS
  Filled 2017-06-22: qty 50

## 2017-06-22 MED ORDER — POTASSIUM CHLORIDE 10 MEQ/100ML IV SOLN
10.0000 meq | INTRAVENOUS | Status: AC
  Administered 2017-06-22 (×3): 10 meq via INTRAVENOUS
  Filled 2017-06-22 (×4): qty 100

## 2017-06-22 MED ORDER — PIPERACILLIN-TAZOBACTAM 3.375 G IVPB 30 MIN: 3.3750 g | Freq: Once | INTRAVENOUS | Status: DC

## 2017-06-22 MED ORDER — PIPERACILLIN-TAZOBACTAM 3.375 G IVPB 30 MIN
3.3750 g | Freq: Once | INTRAVENOUS | Status: AC
  Administered 2017-06-22: 3.375 g via INTRAVENOUS
  Filled 2017-06-22: qty 50

## 2017-06-22 MED ORDER — DEXTROSE 50 % IV SOLN
25.0000 mL | Freq: Once | INTRAVENOUS | Status: AC
  Administered 2017-06-22: 25 mL via INTRAVENOUS

## 2017-06-22 MED ORDER — ROCURONIUM BROMIDE 50 MG/5ML IV SOLN
50.0000 mg | Freq: Once | INTRAVENOUS | Status: AC
  Administered 2017-06-22: 50 mg via INTRAVENOUS
  Filled 2017-06-22: qty 5

## 2017-06-22 MED ORDER — ORAL CARE MOUTH RINSE
15.0000 mL | Freq: Four times a day (QID) | OROMUCOSAL | Status: DC
  Administered 2017-06-23 (×5): 15 mL via OROMUCOSAL

## 2017-06-22 MED ORDER — VANCOMYCIN HCL IN DEXTROSE 1-5 GM/200ML-% IV SOLN
1000.0000 mg | Freq: Once | INTRAVENOUS | Status: AC
  Administered 2017-06-22: 1000 mg via INTRAVENOUS
  Filled 2017-06-22: qty 200

## 2017-06-22 MED ORDER — DEXTROSE 5 % IV SOLN
INTRAVENOUS | Status: DC
  Administered 2017-06-22: 19:00:00 via INTRAVENOUS

## 2017-06-22 MED ORDER — IOPAMIDOL (ISOVUE-300) INJECTION 61%
75.0000 mL | Freq: Once | INTRAVENOUS | Status: AC | PRN
  Administered 2017-06-22: 75 mL via INTRAVENOUS

## 2017-06-22 MED ORDER — DEXTROSE-NACL 5-0.45 % IV SOLN
INTRAVENOUS | Status: DC
  Administered 2017-06-22: 1000 mL via INTRAVENOUS

## 2017-06-22 MED ORDER — VANCOMYCIN HCL IN DEXTROSE 1-5 GM/200ML-% IV SOLN: 1000.0000 mg | Freq: Once | INTRAVENOUS | Status: DC

## 2017-06-22 MED ORDER — FENTANYL CITRATE (PF) 100 MCG/2ML IJ SOLN
50.0000 ug | INTRAMUSCULAR | Status: DC | PRN
  Administered 2017-06-22: 100 ug via INTRAVENOUS

## 2017-06-22 MED ORDER — SODIUM BICARBONATE 8.4 % IV SOLN: INTRAVENOUS | Status: DC

## 2017-06-22 MED ORDER — POTASSIUM CHLORIDE 10 MEQ/100ML IV SOLN
10.0000 meq | Freq: Once | INTRAVENOUS | Status: AC
  Administered 2017-06-22: 10 meq via INTRAVENOUS
  Filled 2017-06-22: qty 100

## 2017-06-22 MED ORDER — VANCOMYCIN HCL IN DEXTROSE 750-5 MG/150ML-% IV SOLN
750.0000 mg | INTRAVENOUS | Status: DC
  Administered 2017-06-23 (×2): 750 mg via INTRAVENOUS
  Filled 2017-06-22 (×3): qty 150

## 2017-06-22 MED ORDER — FENTANYL 2500MCG IN NS 250ML (10MCG/ML) PREMIX INFUSION
0.0000 ug/h | INTRAVENOUS | Status: DC
  Filled 2017-06-22: qty 250

## 2017-06-22 MED ORDER — SODIUM CHLORIDE 0.9 % IV SOLN: 10.0000 mL/h | Freq: Once | INTRAVENOUS | Status: DC

## 2017-06-22 MED ORDER — POTASSIUM CHLORIDE 10 MEQ/50ML IV SOLN
10.0000 meq | INTRAVENOUS | Status: AC
  Administered 2017-06-23 (×4): 10 meq via INTRAVENOUS
  Filled 2017-06-22 (×4): qty 50

## 2017-06-22 MED ORDER — PANTOPRAZOLE SODIUM 40 MG IV SOLR
40.0000 mg | Freq: Every day | INTRAVENOUS | Status: DC
  Administered 2017-06-22: 40 mg via INTRAVENOUS
  Filled 2017-06-22: qty 40

## 2017-06-22 MED ORDER — VANCOMYCIN HCL IN DEXTROSE 750-5 MG/150ML-% IV SOLN: 750.0000 mg | Freq: Two times a day (BID) | INTRAVENOUS | Status: DC

## 2017-06-22 MED ORDER — CHLORHEXIDINE GLUCONATE 0.12% ORAL RINSE (MEDLINE KIT)
15.0000 mL | Freq: Two times a day (BID) | OROMUCOSAL | Status: DC
  Administered 2017-06-22 – 2017-06-30 (×16): 15 mL via OROMUCOSAL

## 2017-06-22 MED ORDER — MIDAZOLAM HCL 2 MG/2ML IJ SOLN: 1.0000 mg | INTRAMUSCULAR | Status: DC | PRN

## 2017-06-22 MED ORDER — MAGNESIUM SULFATE 4 GM/100ML IV SOLN
4.0000 g | Freq: Once | INTRAVENOUS | Status: AC
  Administered 2017-06-22: 4 g via INTRAVENOUS
  Filled 2017-06-22: qty 100

## 2017-06-22 MED ORDER — FENTANYL CITRATE (PF) 100 MCG/2ML IJ SOLN
INTRAMUSCULAR | Status: AC
  Filled 2017-06-22: qty 2

## 2017-06-22 MED ORDER — NOREPINEPHRINE BITARTRATE 1 MG/ML IV SOLN
0.0000 ug/min | INTRAVENOUS | Status: DC
  Administered 2017-06-22: 2 ug/min via INTRAVENOUS
  Filled 2017-06-22: qty 4

## 2017-06-22 MED ORDER — SODIUM BICARBONATE 8.4 % IV SOLN
INTRAVENOUS | Status: DC
  Administered 2017-06-22 – 2017-06-23 (×3): via INTRAVENOUS
  Filled 2017-06-22 (×7): qty 150

## 2017-06-22 NOTE — Consult Note (Signed)
PULMONARY / CRITICAL CARE MEDICINE   Name: [name] MRN: [id number] DOB: [birth year]    ADMISSION DATE:  06/22/2017 CONSULTATION DATE:  12/24  REFERRING MD: Margaretmary Eddy  PT PROFILE:   45 M with hx of recently resected colon cancer (04/17/17 at [facility]) and recent MRSA Port-A-Cath infection with bacteremia brought to [facility] ED by EMS for altered cognition and intubated in the ED for same.  Was found to be profoundly hypoglycemic by EMS.  Received dextrose without improvement in mental status.  Also received naloxone without improvement in cognition.   MAJOR EVENTS/TEST RESULTS: 12/24 admission via ED as noted above.  Transfused 1 unit PRBCs in the ED for hemoglobin 7.3.  Bicarbonate infusion initiated for profound hyperchloremic acidosis 12/24 CT head: No acute findings 12/24 CTAP: No acute findings in the abdomen or pelvis.  Patchy opacity in left lower lobe of uncertain significance  INDWELLING DEVICES:: ETT 12/24 >>   MICRO DATA: Urine 12/24 >>  Resp 12/24 >>  Blood work 12/24 >>   ANTIMICROBIALS:  Vancomycin 12/24 >>  Pip-tazo 12/24 >>     HISTORY OF PRESENT ILLNESS:   Summary as above.  The patient's family provides the following history.  He is had profound weight loss over the past year (approximately 60 pounds or more).  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  He underwent surgery as documented above.  He had a Port-A-Cath placed for chemotherapy and developed MRSA bacteremia as documented above.  He is under the care of our oncology and infectious disease services.  He has been seen in the emergency department several times in the past couple of months.  He was seen on the day prior to this admission.  On the evening prior to admission he was in his usual state of poor health according to his mother.  She administered his insulin and he went to bed.  He did not awaken this morning and eventually his mother went in to rouse him and  found him unarousable.  Therefore, EMS was dispatched.  In the emergency department he was noted to be hypothermic, deeply unresponsive, no longer hypoglycemic.  PAST MEDICAL HISTORY :  He  has a past medical history of Anemia, BPH without obstruction/lower urinary tract symptoms, Cancer ([place]), Cellulitis of scrotum, Cyst of prostate (05/13/2015), Diabetes ([place]), Difficulty urinating, Dyspnea, Epididymoorchitis, ETOH abuse, Fracture of right tibia and fibula, GERD (gastroesophageal reflux disease), GI bleed, Malignant neoplasm of sigmoid colon ([place]), Pancreatitis, Trichomoniasis, and Wears dentures.  PAST SURGICAL HISTORY: He  has a past surgical history that includes resection of pancreas; Scrotal exploration; Fracture surgery; Fracture surgery; Esophagogastroduodenoscopy (egd) with propofol (N/A, 12/02/2016); Colonoscopy with propofol (N/A, 12/02/2016); Flexible sigmoidoscopy (N/A, 02/05/2017); Laparoscopic partial colectomy (04/17/2017); Portacath placement (Right, 05/18/2017); Port-a-cath removal (N/A, 05/29/2017); and TEE without cardioversion (N/A, 06/04/2017).  Allergies  Allergen Reactions  . Aspirin Itching and Other (See Comments)    Reaction: abdominal pain    No current facility-administered medications on file prior to encounter.    Current Outpatient Medications on File Prior to Encounter  Medication Sig  . B-D UF III MINI PEN NEEDLES 31G X 5 MM MISC   . Blood Glucose Monitoring Suppl (GLUCOCOM BLOOD GLUCOSE MONITOR) DEVI use as directed  . diphenoxylate-atropine (LOMOTIL) 2.5-0.025 MG tablet take 1 tablet four times a day if needed  . lidocaine-prilocaine (EMLA) cream Apply over porta cath 1-2 hours prior to chemo.  . ondansetron (ZOFRAN) 8 MG tablet  Take 1 tablet (8 mg total) by mouth every 8 (eight) hours as needed for nausea or vomiting.  . prochlorperazine (COMPAZINE) 10 MG tablet Take 1 tablet (10 mg total) by mouth every 6 (six) hours as needed for nausea or vomiting.  Marland Kitchen  amitriptyline (ELAVIL) 25 MG tablet Take 25 mg by mouth.  Marland Kitchen aspirin EC 81 MG tablet Take 81 mg by mouth.  . cyclobenzaprine (FLEXERIL) 10 MG tablet Take 10 mg by mouth 2 (two) times daily.  Marland Kitchen LANTUS SOLOSTAR 100 UNIT/ML Solostar Pen Inject 10 Units into the skin 2 (two) times daily. (Patient taking differently: Inject 10 Units into the skin daily at 10 pm. )  . lipase/protease/amylase (CREON) 36000 UNITS CPEP capsule Take 1 capsule (36,000 Units total) 3 (three) times daily before meals by mouth.  . midodrine (PROAMATINE) 5 MG tablet Take 1 tablet (5 mg total) by mouth 3 (three) times daily with meals.  Marland Kitchen NOVOLOG FLEXPEN 100 UNIT/ML FlexPen Inject 48 Units into the skin 3 (three) times daily with meals.   . Oxycodone HCl 10 MG TABS Take 1 tablet (10 mg total) every 6 (six) hours as needed by mouth.  Marland Kitchen RA ACETAMINOPHEN 325 MG tablet take 2 tablets by mouth every 6 hours  . traZODone (DESYREL) 50 MG tablet take 1 tablet by mouth at bedtime TO HELP WITH SLEEP-PRN  . Vancomycin (VANCOCIN) 750-5 MG/150ML-% SOLN Inject 150 mLs (750 mg total) into the vein every 12 (twelve) hours.  . Vitamins/Minerals TABS Take 1 tablet every morning by mouth.     FAMILY HISTORY:  His indicated that his father is deceased. He indicated that his brother is deceased.   SOCIAL HISTORY: He  reports that he has been smoking cigarettes.  He has a 10.25 pack-year smoking history. he has never used smokeless tobacco. He reports that he drinks alcohol. He reports that he does not use drugs.  REVIEW OF SYSTEMS:   Level 5 caveat  SUBJECTIVE:    VITAL SIGNS: BP 104/71   Pulse (!) 49   Temp (!) 90.3 F (32.4 C)   Resp 20   Ht 5\' 10"  (1.778 m)   Wt 51.3 kg (113 lb)   SpO2 100%   BMI 16.21 kg/m   HEMODYNAMICS:    VENTILATOR SETTINGS: Vent Mode: AC FiO2 (%):  [40 %] 40 % Set Rate:  [14 bmp] 14 bmp Vt Set:  [500 mL] 500 mL PEEP:  [5 cmH20] 5 cmH20  INTAKE / OUTPUT: No intake/output data  recorded.  PHYSICAL EXAMINATION: General: Chronically ill-appearing, intubated, deeply unresponsive Neuro: PERRLA, EOMI (doll's eyes), no withdrawal, no spontaneous movement, no abnormal DTRs HEENT: NCAT, sclerae white Cardiovascular: Bradycardic, regular, no M Lungs: Clear anteriorly with full breath sounds Abdomen: Scaphoid, surgical scar is well-healed, diminished bowel sounds, no palpable masses Ext: Chronic discoloration of RLE, no edema  LABS:  BMET Recent Labs  Lab 06/17/17 1307 06/22/17 1336  NA 141 145  K 3.2* 2.9*  CL 119* >130*  CO2 18* 14*  BUN 12 19  CREATININE 0.96 1.08  GLUCOSE 187* 103*    Electrolytes Recent Labs  Lab 06/17/17 1307 06/22/17 1336  CALCIUM 8.9 8.9    CBC Recent Labs  Lab 06/17/17 1307 06/22/17 1336  WBC 6.8 13.9*  HGB 8.8* 7.3*  HCT 27.6* 23.4*  PLT 167 98*    Coag's No results for input(s): APTT, INR in the last 168 hours.  Sepsis Markers Recent Labs  Lab 06/17/17 1307 06/22/17 1334  LATICACIDVEN  1.7 1.1    ABG Recent Labs  Lab 06/22/17 1424  PHART 7.23*  PCO2ART 24*  PO2ART 233*    Liver Enzymes Recent Labs  Lab 06/17/17 1307 06/22/17 1336  AST 54* 30  ALT 47 32  ALKPHOS 149* 93  BILITOT 0.4 0.5  ALBUMIN 2.5* 2.1*    Cardiac Enzymes Recent Labs  Lab 06/22/17 1336  TROPONINI <0.03    Glucose Recent Labs  Lab 06/22/17 1327 06/22/17 1354 06/22/17 1437 06/22/17 1624  GLUCAP 107* 70 114* 132*    CXR: No acute cardiac or pulmonary findings    ASSESSMENT / PLAN:  PULMONARY A: Acute respiratory failure due to depressed LOC P:   Vent settings established Vent bundle implemented Daily SBT as indicated   CARDIOVASCULAR A:  Hypotension, resolved with IVF's Sinus bradycardia P:  Monitor BP and rhythm  RENAL A:   AKI Severe hyperchloremic (non-gap) acidosis Hypokalemia Hypernatremia P:   Monitor BMET intermittently Monitor I/Os Correct electrolytes as indicated D5W + HCO3  + KCl ordered  GASTROINTESTINAL A:   Recent bowel resection Chronic pancreatitis and pancreatic insufficiency P:   SUP: IV PPI Consider nutritional support 12/25  HEMATOLOGIC A:   Acute on chronic anemia without overt blood loss P:  PRBCs ordered by ED MD DVT px: SCDs Monitor CBC intermittently Transfuse per usual guidelines   INFECTIOUS A:   Possible severe sepsis Possible left lower lobe pneumonia P:   Monitor temp, WBC count Micro and abx as above   ENDOCRINE A:   Type 2 diabetes Severe hypoglycemia P:   Monitor CBGs hourly until fully stabilized Dextrose infusion ordered  NEUROLOGIC A:   Severe acute encephalopathy - suspect TME/hypoglycemic P:   RASS goal: 0, -1 PAD protocol -intermittent fentanyl, midazolam   FAMILY  - Updates: Family updated at bedside in the ED  CCM time: 45 mins  The above time includes time spent in consultation with patient and/or family members and reviewing care plan on multidisciplinary rounds  Merton Border, MD PCCM service Mobile [phone] Pager [phone]   06/22/2017, 4:36 PM

## 2017-06-22 NOTE — Progress Notes (Signed)
CODE SEPSIS - PHARMACY COMMUNICATION  **Broad Spectrum Antibiotics should be administered within 1 hour of Sepsis diagnosis**  Time Code Sepsis Called/Page Received: 1400  Antibiotics Ordered: vancomycin and Zosyn  Time of 1st antibiotic administration: 5573  Additional action taken by pharmacy: contacted RN at 1445  If necessary, Name of Provider/Nurse Contacted:     Napoleon Form ,PharmD Clinical Pharmacist  06/22/2017  2:17 PM

## 2017-06-22 NOTE — ED Notes (Signed)
Ashley RN, aware of bed assigned  

## 2017-06-22 NOTE — ED Notes (Signed)
Bear Hugger placed on pt at this time.

## 2017-06-22 NOTE — ED Notes (Signed)
Intensivist to bedside at this time.

## 2017-06-22 NOTE — ED Provider Notes (Addendum)
[facility] Emergency Department Provider Note  ____________________________________________   First MD Initiated Contact with Patient 06/22/17 1323     (approximate)  I have reviewed the triage vital signs and the nursing notes.   HISTORY  Chief Complaint Altered Mental Status   HPI [name] is a 56 y.o. male as well as colon cancer and diabetes who is presenting to the emergency department today with an altered mental status.  EMS was called out for altered mental status.  Roommates at his home report that the patient was last seen acting normally between 12 AM and 1 AM this morning.  Others at the home thought that the patient was just sleeping a long time but then were unable to arouse him.  EMS arrived and found the patient with a glucose of 27, unresponsive.  They also found him to have pinpoint pupils and to be hypotensive to the 60s.  An amp of D50 was given which brought his sugar up to 197.  2 mg of Narcan was given without response.  EMS states that the patient after fluids as well as medication is breathing with more frequency but otherwise has not had any significant change in his mental status.  Patient seen here yesterday in the emergency department for hypotension and given IV fluids which resolved his hypotension.  He was discharged home.  Past Medical History:  Diagnosis Date  . Anemia   . BPH without obstruction/lower urinary tract symptoms   . Cancer ([place])   . Cellulitis of scrotum   . Cyst of prostate 05/13/2015  . Diabetes ([place])   . Difficulty urinating   . Dyspnea   . Epididymoorchitis   . ETOH abuse   . Fracture of right tibia and fibula   . GERD (gastroesophageal reflux disease)   . GI bleed   . Malignant neoplasm of sigmoid colon ([place])   . Pancreatitis   . Trichomoniasis   . Wears dentures    Partial upper and lower.  reports poor fit.    Patient Active Problem List   Diagnosis Date Noted  . 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 Sepsis ([place]) 05/28/2017  . Local infection due to central venous catheter   . Tobacco use disorder 04/21/2017  . 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 Moderate malnutrition ([place]) 02/12/2017  . Pancreatic insufficiency 02/12/2017  . Chronic abdominal pain 01/29/2017  . Malignant neoplasm of sigmoid colon ([place]) 01/15/2017  . Shock ([place]) 12/02/2016  . Acute posthemorrhagic anemia   . Benign neoplasm of ascending colon   . Colorectal polyps   . Noninfectious diarrhea   . Syncope 12/01/2016  . Chronic diarrhea 12/01/2016  . Pancreatitis 04/17/2016  . Noncompliance 07/05/2015  . Tobacco dependence 07/05/2015  . Fracture of right tibia and fibula 06/05/2015  . Type 2 diabetes mellitus with hyperglycemia ([place]) 06/05/2015  . Prostatic cyst 05/13/2015  . GI bleed 05/12/2015  . Type 2 diabetes mellitus ([place]) 05/12/2015  . GERD (gastroesophageal reflux disease) 05/12/2015  . ETOH abuse 05/12/2015  . BPH (benign prostatic hyperplasia) 05/12/2015    Past Surgical History:  Procedure Laterality Date  . COLONOSCOPY WITH PROPOFOL N/A 12/02/2016   Procedure: COLONOSCOPY WITH PROPOFOL;  Surgeon: Lucilla Lame, MD;  Location: [facility] ENDOSCOPY;  Service: Endoscopy;  Laterality: N/A;  . ESOPHAGOGASTRODUODENOSCOPY (EGD) WITH PROPOFOL N/A 12/02/2016   Procedure: ESOPHAGOGASTRODUODENOSCOPY (EGD) WITH PROPOFOL;  Surgeon: Lucilla Lame, MD;  Location: [facility] ENDOSCOPY;  Service: Endoscopy;  Laterality:  N/A;  . FLEXIBLE SIGMOIDOSCOPY N/A 02/05/2017   Procedure: FLEXIBLE SIGMOIDOSCOPY;  Surgeon: Lucilla Lame, MD;  Location: [place];  Service: Gastroenterology;  Laterality: N/A;  Needs labs drawn. Needs to come in early.  No anesthesia  . FRACTURE SURGERY     TIBIA AND FIBULA  . FRACTURE SURGERY    . LAPAROSCOPIC PARTIAL COLECTOMY  04/17/2017   [facility]  . PORT-A-CATH REMOVAL N/A 05/29/2017   Procedure: REMOVAL PORT-A-CATH;  Surgeon: Vickie Epley, MD;   Location: [facility] ORS;  Service: Vascular;  Laterality: N/A;  . PORTACATH PLACEMENT Right 05/18/2017   Procedure: INSERTION PORT-A-CATH;  Surgeon: Vickie Epley, MD;  Location: [facility] ORS;  Service: Vascular;  Laterality: Right;  . resection of pancreas    . SCROTAL EXPLORATION    . TEE WITHOUT CARDIOVERSION N/A 06/04/2017   Procedure: TRANSESOPHAGEAL ECHOCARDIOGRAM (TEE);  Surgeon: Corey Skains, MD;  Location: [facility] ORS;  Service: Cardiovascular;  Laterality: N/A;    Prior to Admission medications   Medication Sig Start Date End Date Taking? Authorizing Provider  B-D UF III MINI PEN NEEDLES 31G X 5 MM MISC  10/17/16  Yes [provider]  Blood Glucose Monitoring Suppl (GLUCOCOM BLOOD GLUCOSE MONITOR) DEVI use as directed 08/13/15  Yes [provider]  diphenoxylate-atropine (LOMOTIL) 2.5-0.025 MG tablet take 1 tablet four times a day if needed 01/01/17  Yes [provider]  lidocaine-prilocaine (EMLA) cream Apply over porta cath 1-2 hours prior to chemo. 05/26/17  Yes Earlie Server, MD  ondansetron (ZOFRAN) 8 MG tablet Take 1 tablet (8 mg total) by mouth every 8 (eight) hours as needed for nausea or vomiting. 05/26/17  Yes Earlie Server, MD  prochlorperazine (COMPAZINE) 10 MG tablet Take 1 tablet (10 mg total) by mouth every 6 (six) hours as needed for nausea or vomiting. 05/26/17  Yes Earlie Server, MD  amitriptyline (ELAVIL) 25 MG tablet Take 25 mg by mouth. 02/17/17   [provider]  aspirin EC 81 MG tablet Take 81 mg by mouth. 07/20/15   [provider]  cyclobenzaprine (FLEXERIL) 10 MG tablet Take 10 mg by mouth 2 (two) times daily. 02/15/16   [provider]  LANTUS SOLOSTAR 100 UNIT/ML Solostar Pen Inject 10 Units into the skin 2 (two) times daily. Patient taking differently: Inject 10 Units into the skin daily at 10 pm.  07/02/16   Harvest Dark, MD  lipase/protease/amylase (CREON) 36000 UNITS CPEP capsule Take 1 capsule (36,000 Units total) 3  (three) times daily before meals by mouth. 05/05/17   Earlie Server, MD  midodrine (PROAMATINE) 5 MG tablet Take 1 tablet (5 mg total) by mouth 3 (three) times daily with meals. 06/04/17   Dustin Flock, MD  NOVOLOG FLEXPEN 100 UNIT/ML FlexPen Inject 48 Units into the skin 3 (three) times daily with meals.  05/16/15   [provider]  Oxycodone HCl 10 MG TABS Take 1 tablet (10 mg total) every 6 (six) hours as needed by mouth. 05/18/17   Earlie Server, MD  RA ACETAMINOPHEN 325 MG tablet take 2 tablets by mouth every 6 hours 04/21/17   [provider]  traZODone (DESYREL) 50 MG tablet take 1 tablet by mouth at bedtime TO HELP WITH SLEEP-PRN 12/05/16   [provider]  Vancomycin (VANCOCIN) 750-5 MG/150ML-% SOLN Inject 150 mLs (750 mg total) into the vein every 12 (twelve) hours. 06/04/17   Dustin Flock, MD  Vitamins/Minerals TABS Take 1 tablet every morning by mouth.  05/14/15   [provider]    Allergies Aspirin  Family History  Problem Relation Age of Onset  . Cancer - Colon Father   . Colon cancer Brother     Social History Social History   Tobacco Use  . Smoking status: Current Every Day Smoker    Packs/day: 0.25    Years: 41.00    Pack years: 10.25    Types: Cigarettes  . Smokeless tobacco: Never Used  . Tobacco comment: since age 46  Substance Use Topics  . Alcohol use: Yes    Alcohol/week: 0.0 oz    Comment: occasionally - but says not drinkning currently  . Drug use: No    Review of Systems  Level 5 caveat secondary to altered mental status.   ____________________________________________   PHYSICAL EXAM:  VITAL SIGNS: ED Triage Vitals  Enc Vitals Group     BP 06/22/17 1330 (!) 87/67     Pulse Rate 06/22/17 1333 (!) 54     Resp 06/22/17 1333 19     Temp --      Temp src --      SpO2 06/22/17 1333 100 %     Weight 06/22/17 1336 113 lb (51.3 kg)     Height 06/22/17 1336 5\' 10"  (1.778 m)     Head Circumference --      Peak Flow  --      Pain Score --      Pain Loc --      Pain Edu? --      Excl. in [place]? --     Constitutional: GCS of 3. Eyes: Conjunctivae are normal.  Bilateral pupils are 3 mm and minimally reactive. Head: Atraumatic. Nose: No congestion/rhinnorhea. Mouth/Throat: Mucous membranes are dry Neck: No stridor.   Cardiovascular: Slightly bradycardic in the 50s, regular rhythm. Grossly normal heart sounds.   Respiratory: Snoring respirations with clear lung fields bilaterally. Gastrointestinal: Soft. No distention. Musculoskeletal: No lower extremity edema.  No joint effusions. Neurologic: GCS of 3.  Patient intermittently moves his arms without any purposeful appearing motions.  Also moves his head occasionally from side to side without any purposefully appearing motion. Skin:  Skin is warm, dry and intact. No rash noted.   ____________________________________________   LABS (all labs ordered are listed, but only abnormal results are displayed)  Labs Reviewed  TSH - Abnormal; Notable for the following components:      Result Value   TSH 5.612 (*)    All other components within normal limits  COMPREHENSIVE METABOLIC PANEL - Abnormal; Notable for the following components:   Potassium 2.9 (*)    Chloride >130 (*)    CO2 14 (*)    Glucose, Bld 103 (*)    Total Protein 5.8 (*)    Albumin 2.1 (*)    All other components within normal limits  CBC WITH DIFFERENTIAL/PLATELET - Abnormal; Notable for the following components:   WBC 13.9 (*)    RBC 2.49 (*)    Hemoglobin 7.3 (*)    HCT 23.4 (*)    MCHC 31.2 (*)    RDW 15.9 (*)    Platelets 98 (*)    Neutro Abs 12.5 (*)    All other components within normal limits  URINALYSIS, ROUTINE W REFLEX MICROSCOPIC - Abnormal; Notable for the following components:   Color, Urine YELLOW (*)    APPearance HAZY (*)    Glucose, UA 150 (*)    Protein, ur 30 (*)    Leukocytes, UA MODERATE (*)  Bacteria, UA RARE (*)    Squamous Epithelial / LPF 0-5 (*)      All other components within normal limits  GLUCOSE, CAPILLARY - Abnormal; Notable for the following components:   Glucose-Capillary 107 (*)    All other components within normal limits  ACETAMINOPHEN LEVEL - Abnormal; Notable for the following components:   Acetaminophen (Tylenol), Serum <10 (*)    All other components within normal limits  BLOOD GAS, ARTERIAL - Abnormal; Notable for the following components:   pH, Arterial 7.23 (*)    pCO2 arterial 24 (*)    pO2, Arterial 233 (*)    Bicarbonate 10.1 (*)    Acid-base deficit 15.9 (*)    All other components within normal limits  GLUCOSE, CAPILLARY - Abnormal; Notable for the following components:   Glucose-Capillary 114 (*)    All other components within normal limits  CULTURE, BLOOD (ROUTINE X 2)  CULTURE, BLOOD (ROUTINE X 2)  URINE CULTURE  LACTIC ACID, PLASMA  TROPONIN I  URINE DRUG SCREEN, QUALITATIVE ([facility] ONLY)  SALICYLATE LEVEL  ETHANOL  LIPASE, BLOOD  GLUCOSE, CAPILLARY  TYPE AND SCREEN  PREPARE RBC (CROSSMATCH)  TYPE AND SCREEN   ____________________________________________  EKG  ED ECG REPORT I, Doran Stabler, the attending physician, personally viewed and interpreted this ECG.   Date: 06/22/2017  EKG Time: 1333  Rate: 59  Rhythm: normal sinus rhythm  Axis: Normal  Intervals:Prolonged PR interval.  ST&T Change: Scooped ST segments in 2 3 and aVF.  Minimal ST elevation in V4 through 6 of about 1 mm. Similar appearing EKGs on the record.  Lateral ST segments appear similar to that of May 25, 2017. ____________________________________________  RADIOLOGY  No active disease on the chest x-ray.  Endotracheal tube 5.5 cm above the carina. ____________________________________________   PROCEDURES  Procedure(s) performed: None   Procedure Name: Intubation Date/Time: 06/22/2017 2:08 PM Performed by: Orbie Pyo, MD Pre-anesthesia Checklist: Patient identified, Emergency Drugs  available, Suction available and Patient being monitored Oxygen Delivery Method: Ambu bag Preoxygenation: Pre-oxygenation with 100% oxygen Induction Type: IV induction and Rapid sequence Ventilation: Mask ventilation without difficulty Laryngoscope Size: Glidescope Tube size: 7.5 mm Number of attempts: 1 Placement Confirmation: ETT inserted through vocal cords under direct vision,  CO2 detector and Breath sounds checked- equal and bilateral Secured at: 23 cm    .Critical Care Performed by: Orbie Pyo, MD Authorized by: Orbie Pyo, MD   Critical care provider statement:    Critical care time (minutes):  35   Critical care time was exclusive of:  Separately billable procedures and treating other patients   Critical care was necessary to treat or prevent imminent or life-threatening deterioration of the following conditions:  Dehydration and shock   Critical care was time spent personally by me on the following activities:  Evaluation of patient's response to treatment, pulse oximetry, re-evaluation of patient's condition and review of old charts    Critical Care performed: No   ____________________________________________   INITIAL IMPRESSION / ASSESSMENT AND PLAN / ED COURSE  Pertinent labs & imaging results that were available during my care of the patient were reviewed by me and considered in my medical decision making (see chart for details).  Differential diagnosis includes, but is not limited to, alcohol, illicit or prescription medications, or other toxic ingestion; intracranial pathology such as stroke or intracerebral hemorrhage; fever or infectious causes including sepsis; hypoxemia and/or hypercarbia; uremia; trauma; endocrine related disorders such as diabetes, hypoglycemia, and  thyroid-related diseases; hypertensive encephalopathy; etc. As part of my medical decision making, I reviewed the following data within the [place]  Notes from prior ED visits  ----------------------------------------- 3:27 PM on 06/22/2017 -----------------------------------------  Patient found to be hypotensive, hypoglycemic with resolution of hypotension with fluids.  Patient with blood pressure now in the 1 teens to 120s.  Heart rate still slightly bradycardic in the 50s and dipping down into the 40s.  Patient requiring intubation secondary to airway protection.  Patient's mental status never improved despite resuscitative efforts and normalization of his blood pressure and glucose.  Started on D5 infusion at 150 cc an hour secondary to dropping glucose on infusion of 100 mL an hour.  Also required an additional half amp of D50.  Sepsis protocol initiated and broad spectrum antibiotics ordered.  To repleat potassium.  Also will order 1 unit of blood because of hemoglobin drop over the past day.  Rectal thermometer without any gross blood on it after rectal temp.  Concern for intracranial hemorrhage versus intra-abdominal hemorrhage.  CT of the head and abdomen are pending.  I discussed the case with Dr. Margaretmary Eddy of the medicine service who says that she will accept the patient to the medicine service and admit to the ICU pending CT scans.  Also discussed the case with the patient's sister who says that she last spoke with patient yesterday about 5:55 PM and he was awake and alert at that time and without any significant complaints.  Initial blood gas and labs indicate a metabolic acidosis.  Possibly secondary to sepsis although there is a normal lactic acid.  Unclear if the patient had an intentional or unintentional overdose of the medications but the patient's sister says that his medications are managed diligently by his mother.  Recent bacteremia as well secondary to port in his right chest which is since been removed.  Overlying skin site appears to be well healed with scabbing without any pus or induration or erythema or warmth.      ____________________________________________   FINAL CLINICAL IMPRESSION(S) / ED DIAGNOSES  Final diagnoses:  Altered mental state  shock. Hypokalemia.  Hyperchloremia.   Hypothermia.  Anemia.  Acidosis.  Hypoglycemia.      NEW MEDICATIONS STARTED DURING THIS VISIT:  This SmartLink is deprecated. Use AVSMEDLIST instead to display the medication list for a patient.   Note:  This document was prepared using Dragon voice recognition software and may include unintentional dictation errors.     Orbie Pyo, MD 06/22/17 [place], Randall An, MD 06/22/17 1539

## 2017-06-22 NOTE — Progress Notes (Signed)
Patient's BP still low after bolus from ED given, verbal order for 500 bolus and norepi received from Dr. Alva Garnet. Bolus started, and instructed to start norepi if hypotension is not resolved after bolus. Wilnette Kales

## 2017-06-22 NOTE — Progress Notes (Signed)
RT assisted with patient transport to CT and back to ER while on the Trilogy ventilator with no complications.

## 2017-06-22 NOTE — ED Notes (Signed)
Pt BP dropping; Dr. Alva Garnet notified, see new orders.

## 2017-06-22 NOTE — ED Notes (Signed)
RT to bedside at this time.

## 2017-06-22 NOTE — Progress Notes (Signed)
Assisted with patient transport from ER to ICU room 2 while patient on the Trilogy ventilator with no complications.

## 2017-06-22 NOTE — Progress Notes (Signed)
Decreased fio2 to 30%, PO2 on ABG was 233.

## 2017-06-22 NOTE — ED Triage Notes (Signed)
Pt in via [facility] from home; pt unresponsive upon arrival with snoring respirations, hypotensive in the 88'Q systolic.  Per EMS pt initial CBG 26; one amp D50, 2mg  Narcan, 792mL NaCl given prior to arrival.  EDP to bedside.

## 2017-06-22 NOTE — Progress Notes (Signed)
ANTIBIOTIC CONSULT NOTE - INITIAL  Pharmacy Consult for Vancomycin and Zosyn Indication: sepsis  Allergies  Allergen Reactions  . Aspirin Itching and Other (See Comments)    Reaction: abdominal pain    Patient Measurements: Height: 5\' 10"  (177.8 cm) Weight: 113 lb (51.3 kg) IBW/kg (Calculated) : 73 Adjusted Body Weight:   Vital Signs: BP: 93/65 (12/24 1345) Pulse Rate: 59 (12/24 1345) Intake/Output from previous day: No intake/output data recorded. Intake/Output from this shift: No intake/output data recorded. Labs: Recent Labs    06/22/17 1336  WBC 13.9*  HGB 7.3*  PLT 98*   Estimated Creatinine Clearance: 61.6 mL/min (by C-G formula based on SCr of 0.96 mg/dL). No results for input(s): VANCOTROUGH, VANCOPEAK, VANCORANDOM, GENTTROUGH, GENTPEAK, GENTRANDOM, TOBRATROUGH, TOBRAPEAK, TOBRARND, AMIKACINPEAK, AMIKACINTROU, AMIKACIN in the last 72 hours.   Microbiology: Recent Results (from the past 720 hour(s))  Culture, blood (Routine x 2)     Status: Abnormal   Collection Time: 05/28/17  6:21 PM  Result Value Ref Range Status   Specimen Description BLOOD BLOOD LEFT ARM  Final   Special Requests   Final    BOTTLES DRAWN AEROBIC AND ANAEROBIC Blood Culture adequate volume   Culture  Setup Time   Final    GRAM POSITIVE COCCI IN BOTH AEROBIC AND ANAEROBIC BOTTLES CRITICAL RESULT CALLED TO, READ BACK BY AND VERIFIED WITH: KRISTIN MERRILL AT 3818 05/29/17 SDR    Culture METHICILLIN RESISTANT STAPHYLOCOCCUS AUREUS (A)  Final   Report Status 05/31/2017 FINAL  Final   Organism ID, Bacteria METHICILLIN RESISTANT STAPHYLOCOCCUS AUREUS  Final      Susceptibility   Methicillin resistant staphylococcus aureus - MIC*    CIPROFLOXACIN >=8 RESISTANT Resistant     ERYTHROMYCIN >=8 RESISTANT Resistant     GENTAMICIN <=0.5 SENSITIVE Sensitive     OXACILLIN >=4 RESISTANT Resistant     TETRACYCLINE <=1 SENSITIVE Sensitive     VANCOMYCIN <=0.5 SENSITIVE Sensitive     TRIMETH/SULFA  <=10 SENSITIVE Sensitive     CLINDAMYCIN RESISTANT Resistant     RIFAMPIN <=0.5 SENSITIVE Sensitive     Inducible Clindamycin POSITIVE Resistant     * METHICILLIN RESISTANT STAPHYLOCOCCUS AUREUS  Blood Culture ID Panel (Reflexed)     Status: Abnormal   Collection Time: 05/28/17  6:21 PM  Result Value Ref Range Status   Enterococcus species NOT DETECTED NOT DETECTED Final   Listeria monocytogenes NOT DETECTED NOT DETECTED Final   Staphylococcus species DETECTED (A) NOT DETECTED Final    Comment: CRITICAL RESULT CALLED TO, READ BACK BY AND VERIFIED WITH:  KRISTIN MERRILL AT 2993 05/29/17 SDR    Staphylococcus aureus DETECTED (A) NOT DETECTED Final    Comment: Methicillin (oxacillin)-resistant Staphylococcus aureus (MRSA). MRSA is predictably resistant to beta-lactam antibiotics (except ceftaroline). Preferred therapy is vancomycin unless clinically contraindicated. Patient requires contact precautions if  hospitalized. CRITICAL RESULT CALLED TO, READ BACK BY AND VERIFIED WITH:  KRISTIN MERRILL AT 7169 05/29/17 SDR    Methicillin resistance DETECTED (A) NOT DETECTED Final    Comment: CRITICAL RESULT CALLED TO, READ BACK BY AND VERIFIED WITH:  KRISTIN MERRILL AT 6789 05/29/17 SDR    Streptococcus species NOT DETECTED NOT DETECTED Final   Streptococcus agalactiae NOT DETECTED NOT DETECTED Final   Streptococcus pneumoniae NOT DETECTED NOT DETECTED Final   Streptococcus pyogenes NOT DETECTED NOT DETECTED Final   Acinetobacter baumannii NOT DETECTED NOT DETECTED Final   Enterobacteriaceae species NOT DETECTED NOT DETECTED Final   Enterobacter cloacae complex NOT DETECTED NOT DETECTED  Final   Escherichia coli NOT DETECTED NOT DETECTED Final   Klebsiella oxytoca NOT DETECTED NOT DETECTED Final   Klebsiella pneumoniae NOT DETECTED NOT DETECTED Final   Proteus species NOT DETECTED NOT DETECTED Final   Serratia marcescens NOT DETECTED NOT DETECTED Final   Haemophilus influenzae NOT DETECTED  NOT DETECTED Final   Neisseria meningitidis NOT DETECTED NOT DETECTED Final   Pseudomonas aeruginosa NOT DETECTED NOT DETECTED Final   Candida albicans NOT DETECTED NOT DETECTED Final   Candida glabrata NOT DETECTED NOT DETECTED Final   Candida krusei NOT DETECTED NOT DETECTED Final   Candida parapsilosis NOT DETECTED NOT DETECTED Final   Candida tropicalis NOT DETECTED NOT DETECTED Final  Culture, blood (Routine x 2)     Status: Abnormal   Collection Time: 05/28/17  7:10 PM  Result Value Ref Range Status   Specimen Description BLOOD LEFT ANTECUBITAL  Final   Special Requests   Final    BOTTLES DRAWN AEROBIC AND ANAEROBIC Blood Culture adequate volume   Culture  Setup Time   Final    GRAM POSITIVE COCCI IN BOTH AEROBIC AND ANAEROBIC BOTTLES CRITICAL VALUE NOTED.  VALUE IS CONSISTENT WITH PREVIOUSLY REPORTED AND CALLED VALUE.    Culture (A)  Final    STAPHYLOCOCCUS AUREUS SUSCEPTIBILITIES PERFORMED ON PREVIOUS CULTURE WITHIN THE LAST 5 DAYS. Performed at [facility], [place] [address] [place], [place] [address]    Report Status 05/31/2017 FINAL  Final  Surgical pcr screen     Status: Abnormal   Collection Time: 05/28/17 10:44 PM  Result Value Ref Range Status   MRSA, PCR POSITIVE (A) NEGATIVE Final    Comment: RESULT CALLED TO, READ BACK BY AND VERIFIED WITH: MAT PAGE ON 05/29/17 AT 0347 BY JAG    Staphylococcus aureus POSITIVE (A) NEGATIVE Final    Comment: RESULT CALLED TO, READ BACK BY AND VERIFIED WITH: MAT PAGE ON 05/29/17 AT 0213 BY JAG (NOTE) The Xpert SA Assay (FDA approved for NASAL specimens in patients 24 years of age and older), is one component of a comprehensive surveillance program. It is not intended to diagnose infection nor to guide or monitor treatment.   Aerobic/Anaerobic Culture (surgical/deep wound)     Status: None   Collection Time: 05/29/17  1:31 PM  Result Value Ref Range Status   Specimen Description WOUND RIGHT UPPER CHEST  Final   Special  Requests NONE  Final   Gram Stain   Final    FEW WBC PRESENT, PREDOMINANTLY PMN FEW GRAM POSITIVE COCCI IN CLUSTERS    Culture   Final    FEW METHICILLIN RESISTANT STAPHYLOCOCCUS AUREUS NO ANAEROBES ISOLATED Performed at [facility], [place] [address], [place], [place] [address]    Report Status 06/03/2017 FINAL  Final   Organism ID, Bacteria METHICILLIN RESISTANT STAPHYLOCOCCUS AUREUS  Final      Susceptibility   Methicillin resistant staphylococcus aureus - MIC*    CIPROFLOXACIN >=8 RESISTANT Resistant     ERYTHROMYCIN >=8 RESISTANT Resistant     GENTAMICIN <=0.5 SENSITIVE Sensitive     OXACILLIN >=4 RESISTANT Resistant     TETRACYCLINE <=1 SENSITIVE Sensitive     VANCOMYCIN <=0.5 SENSITIVE Sensitive     TRIMETH/SULFA <=10 SENSITIVE Sensitive     CLINDAMYCIN RESISTANT Resistant     RIFAMPIN <=0.5 SENSITIVE Sensitive     Inducible Clindamycin POSITIVE Resistant     * FEW METHICILLIN RESISTANT STAPHYLOCOCCUS AUREUS  Culture, blood (single) w Reflex to ID Panel  Status: None   Collection Time: 05/29/17  6:06 PM  Result Value Ref Range Status   Specimen Description BLOOD LEFT ASSIST CONTROL  Final   Special Requests   Final    BOTTLES DRAWN AEROBIC AND ANAEROBIC Blood Culture adequate volume   Culture NO GROWTH 5 DAYS  Final   Report Status 06/03/2017 FINAL  Final  CULTURE, BLOOD (ROUTINE X 2) w Reflex to ID Panel     Status: None   Collection Time: 05/30/17 11:35 AM  Result Value Ref Range Status   Specimen Description BLOOD LEFT ASSIST CONTROL  Final   Special Requests   Final    BOTTLES DRAWN AEROBIC AND ANAEROBIC Blood Culture adequate volume   Culture NO GROWTH 5 DAYS  Final   Report Status 06/04/2017 FINAL  Final  CULTURE, BLOOD (ROUTINE X 2) w Reflex to ID Panel     Status: None   Collection Time: 05/30/17 11:59 AM  Result Value Ref Range Status   Specimen Description BLOOD LEFT HAND  Final   Special Requests   Final    BOTTLES DRAWN AEROBIC AND ANAEROBIC  Blood Culture adequate volume   Culture NO GROWTH 5 DAYS  Final   Report Status 06/04/2017 FINAL  Final  CULTURE, BLOOD (ROUTINE X 2) w Reflex to ID Panel     Status: None   Collection Time: 05/31/17  5:01 AM  Result Value Ref Range Status   Specimen Description BLOOD LEFT ANTECUBITAL  Final   Special Requests   Final    BOTTLES DRAWN AEROBIC AND ANAEROBIC Blood Culture adequate volume   Culture NO GROWTH 5 DAYS  Final   Report Status 06/05/2017 FINAL  Final  CULTURE, BLOOD (ROUTINE X 2) w Reflex to ID Panel     Status: None   Collection Time: 05/31/17  5:10 AM  Result Value Ref Range Status   Specimen Description BLOOD LEFT HAND  Final   Special Requests   Final    BOTTLES DRAWN AEROBIC AND ANAEROBIC Blood Culture adequate volume   Culture NO GROWTH 5 DAYS  Final   Report Status 06/05/2017 FINAL  Final  CULTURE, BLOOD (ROUTINE X 2) w Reflex to ID Panel     Status: None   Collection Time: 06/01/17  4:50 AM  Result Value Ref Range Status   Specimen Description BLOOD LEFT AC  Final   Special Requests   Final    BOTTLES DRAWN AEROBIC AND ANAEROBIC Blood Culture adequate volume   Culture NO GROWTH 5 DAYS  Final   Report Status 06/06/2017 FINAL  Final  CULTURE, BLOOD (ROUTINE X 2) w Reflex to ID Panel     Status: None   Collection Time: 06/01/17  4:57 AM  Result Value Ref Range Status   Specimen Description BLOOD LEFT HAND  Final   Special Requests   Final    BOTTLES DRAWN AEROBIC AND ANAEROBIC Blood Culture adequate volume   Culture NO GROWTH 5 DAYS  Final   Report Status 06/06/2017 FINAL  Final  CULTURE, BLOOD (ROUTINE X 2) w Reflex to ID Panel     Status: None   Collection Time: 06/02/17  4:50 AM  Result Value Ref Range Status   Specimen Description BLOOD BLOOD LEFT HAND  Final   Special Requests   Final    BOTTLES DRAWN AEROBIC AND ANAEROBIC Blood Culture adequate volume   Culture NO GROWTH 5 DAYS  Final   Report Status 06/07/2017 FINAL  Final  CULTURE, BLOOD (ROUTINE X  2) w  Reflex to ID Panel     Status: None   Collection Time: 06/02/17  4:57 AM  Result Value Ref Range Status   Specimen Description BLOOD RIGHT ANTECUBITAL  Final   Special Requests   Final    BOTTLES DRAWN AEROBIC AND ANAEROBIC Blood Culture adequate volume   Culture NO GROWTH 5 DAYS  Final   Report Status 06/07/2017 FINAL  Final    Medical History: Past Medical History:  Diagnosis Date  . Anemia   . BPH without obstruction/lower urinary tract symptoms   . Cancer ([place])   . Cellulitis of scrotum   . Cyst of prostate 05/13/2015  . Diabetes ([place])   . Difficulty urinating   . Dyspnea   . Epididymoorchitis   . ETOH abuse   . Fracture of right tibia and fibula   . GERD (gastroesophageal reflux disease)   . GI bleed   . Malignant neoplasm of sigmoid colon ([place])   . Pancreatitis   . Trichomoniasis   . Wears dentures    Partial upper and lower.  reports poor fit.    Medications:   (Not in a hospital admission) Scheduled:  . dextrose       Assessment: Pharmacy consulted to dose and monitor Vancomycin and Zosyn in this 55 year old male being treated empirically for sepsis  Goal of Therapy:  VT 15-20   Plan:  Patient received Vancomycin 1 g IV x 1. Will start Vancomycin 750 mg IV q18 hours starting @ midnight on 12/25.   Zosyn: Zosyn 3.375 g IV q8 hours.   Leara Rawl D 06/22/2017,2:21 PM

## 2017-06-22 NOTE — Progress Notes (Signed)
Advanced patients ET tube into 25cm at the lip per request of Dr. Leonard Schwartz.

## 2017-06-22 NOTE — ED Notes (Signed)
RN unavailable to take report at this time; name and ascom number left to receive call back.

## 2017-06-22 NOTE — Procedures (Signed)
Central Venous Catheter Insertion Procedure Note CALLOWAY ANDRUS [id number] 1961/06/24  Procedure: Insertion of Central Venous Catheter Indications: Assessment of intravascular volume, Drug and/or fluid administration and Frequent blood sampling  Procedure Details Consent: Risks of procedure as well as the alternatives and risks of each were explained to the (patient/caregiver).  Consent for procedure obtained. Time Out: Verified patient identification, verified procedure, site/side was marked, verified correct patient position, special equipment/implants available, medications/allergies/relevent history reviewed, required imaging and test results available.  Performed  Maximum sterile technique was used including antiseptics, cap, gloves, gown, hand hygiene, mask and sheet. Skin prep: Chlorhexidine; local anesthetic administered A antimicrobial bonded/coated triple lumen catheter was placed in the left internal jugular vein using the Seldinger technique.  Evaluation Blood flow good Complications: No apparent complications Patient did tolerate procedure well. Chest X-ray ordered to verify placement.  CXR: pending.  Left internal jugular centra line placed utilizing ultrasound no complications noted during or following procedure.  Marda Stalker, [place] Pager [phone] (please enter 7 digits) PCCM Consult Pager [phone] (please enter 7 digits)

## 2017-06-22 NOTE — H&P (Signed)
[facility] [place] NAME: [name]    MR#:  [id number]  DATE OF BIRTH:  1961-06-29  DATE OF ADMISSION:  06/22/2017  PRIMARY CARE PHYSICIAN: [name], MD   REQUESTING/REFERRING PHYSICIAN: Orbie Pyo, MD  CHIEF COMPLAINT:   Altered mental status HISTORY OF PRESENT ILLNESS:  [name]  is a 56 y.o. male with a known history of insulin-dependent diabetes mellitus, recently not requiring that much insulin, colon cancer status post colon resection not following any oncologist, history of recurrent infections as reported by the family came into the ED yesterday for being hypotensive. Patient was given IV fluids and he was discharged home. Pt was acting normal until 12 am to 1am today. Today morning family members tried to arouse him as he was sleeping for long time, called EMS as patient was unarousable. EMS found him with pinpoint pupils and blood glucose was at 27. Patient was given an ampule of D50 and sugar went up to 197. 2 MG of Narcan was givenwith no response. Patient was brought into the emergency department. Patient was found to be hypotensive, hypothermic and hypoxemic. Patient was intubated. CT head is negative. CT abdomen and pelvis with no acute findings but concerning for some patchy opacities in the left lower lobe . Lactic acid is normal , leukocytosis is present. Patient is started on broad-spectrum IV antibiotics and hospitalist team is called to admit the patient. Patient's sister and friend at bedside  PAST MEDICAL HISTORY:   Past Medical History:  Diagnosis Date  . Anemia   . BPH without obstruction/lower urinary tract symptoms   . Cancer ([place])   . Cellulitis of scrotum   . Cyst of prostate 05/13/2015  . Diabetes ([place])   . Difficulty urinating   . Dyspnea   . Epididymoorchitis   . ETOH abuse   . Fracture of right tibia and fibula   . GERD (gastroesophageal reflux disease)   . GI bleed   .  Malignant neoplasm of sigmoid colon ([place])   . Pancreatitis   . Trichomoniasis   . Wears dentures    Partial upper and lower.  reports poor fit.    PAST SURGICAL HISTOIRY:   Past Surgical History:  Procedure Laterality Date  . COLONOSCOPY WITH PROPOFOL N/A 12/02/2016   Procedure: COLONOSCOPY WITH PROPOFOL;  Surgeon: Lucilla Lame, MD;  Location: [facility] ENDOSCOPY;  Service: Endoscopy;  Laterality: N/A;  . ESOPHAGOGASTRODUODENOSCOPY (EGD) WITH PROPOFOL N/A 12/02/2016   Procedure: ESOPHAGOGASTRODUODENOSCOPY (EGD) WITH PROPOFOL;  Surgeon: Lucilla Lame, MD;  Location: [facility] ENDOSCOPY;  Service: Endoscopy;  Laterality: N/A;  . FLEXIBLE SIGMOIDOSCOPY N/A 02/05/2017   Procedure: FLEXIBLE SIGMOIDOSCOPY;  Surgeon: Lucilla Lame, MD;  Location: [place];  Service: Gastroenterology;  Laterality: N/A;  Needs labs drawn. Needs to come in early.  No anesthesia  . FRACTURE SURGERY     TIBIA AND FIBULA  . FRACTURE SURGERY    . LAPAROSCOPIC PARTIAL COLECTOMY  04/17/2017   [facility]  . PORT-A-CATH REMOVAL N/A 05/29/2017   Procedure: REMOVAL PORT-A-CATH;  Surgeon: Vickie Epley, MD;  Location: [facility] ORS;  Service: Vascular;  Laterality: N/A;  . PORTACATH PLACEMENT Right 05/18/2017   Procedure: INSERTION PORT-A-CATH;  Surgeon: Vickie Epley, MD;  Location: [facility] ORS;  Service: Vascular;  Laterality: Right;  . resection of pancreas    . SCROTAL EXPLORATION    . TEE WITHOUT CARDIOVERSION N/A 06/04/2017   Procedure: TRANSESOPHAGEAL ECHOCARDIOGRAM (TEE);  Surgeon: Serafina Royals  J, MD;  Location: [facility] ORS;  Service: Cardiovascular;  Laterality: N/A;    SOCIAL HISTORY:   Social History   Tobacco Use  . Smoking status: Current Every Day Smoker    Packs/day: 0.25    Years: 41.00    Pack years: 10.25    Types: Cigarettes  . Smokeless tobacco: Never Used  . Tobacco comment: since age 27  Substance Use Topics  . Alcohol use: Yes    Alcohol/week: 0.0 oz    Comment: occasionally - but says not  drinkning currently    FAMILY HISTORY:   Family History  Problem Relation Age of Onset  . Cancer - Colon Father   . Colon cancer Brother     DRUG ALLERGIES:   Allergies  Allergen Reactions  . Aspirin Itching and Other (See Comments)    Reaction: abdominal pain    REVIEW OF SYSTEMS:  Review of system unobtainable    MEDICATIONS AT HOME:   Prior to Admission medications   Medication Sig Start Date End Date Taking? Authorizing Provider  B-D UF III MINI PEN NEEDLES 31G X 5 MM MISC  10/17/16  Yes [provider]  Blood Glucose Monitoring Suppl (GLUCOCOM BLOOD GLUCOSE MONITOR) DEVI use as directed 08/13/15  Yes [provider]  diphenoxylate-atropine (LOMOTIL) 2.5-0.025 MG tablet take 1 tablet four times a day if needed 01/01/17  Yes [provider]  lidocaine-prilocaine (EMLA) cream Apply over porta cath 1-2 hours prior to chemo. 05/26/17  Yes Earlie Server, MD  ondansetron (ZOFRAN) 8 MG tablet Take 1 tablet (8 mg total) by mouth every 8 (eight) hours as needed for nausea or vomiting. 05/26/17  Yes Earlie Server, MD  prochlorperazine (COMPAZINE) 10 MG tablet Take 1 tablet (10 mg total) by mouth every 6 (six) hours as needed for nausea or vomiting. 05/26/17  Yes Earlie Server, MD  amitriptyline (ELAVIL) 25 MG tablet Take 25 mg by mouth. 02/17/17   [provider]  aspirin EC 81 MG tablet Take 81 mg by mouth. 07/20/15   [provider]  cyclobenzaprine (FLEXERIL) 10 MG tablet Take 10 mg by mouth 2 (two) times daily. 02/15/16   [provider]  LANTUS SOLOSTAR 100 UNIT/ML Solostar Pen Inject 10 Units into the skin 2 (two) times daily. Patient taking differently: Inject 10 Units into the skin daily at 10 pm.  07/02/16   Harvest Dark, MD  lipase/protease/amylase (CREON) 36000 UNITS CPEP capsule Take 1 capsule (36,000 Units total) 3 (three) times daily before meals by mouth. 05/05/17   Earlie Server, MD  midodrine (PROAMATINE) 5 MG tablet Take 1 tablet (5 mg  total) by mouth 3 (three) times daily with meals. 06/04/17   Dustin Flock, MD  NOVOLOG FLEXPEN 100 UNIT/ML FlexPen Inject 48 Units into the skin 3 (three) times daily with meals.  05/16/15   [provider]  Oxycodone HCl 10 MG TABS Take 1 tablet (10 mg total) every 6 (six) hours as needed by mouth. 05/18/17   Earlie Server, MD  RA ACETAMINOPHEN 325 MG tablet take 2 tablets by mouth every 6 hours 04/21/17   [provider]  traZODone (DESYREL) 50 MG tablet take 1 tablet by mouth at bedtime TO HELP WITH SLEEP-PRN 12/05/16   [provider]  Vancomycin (VANCOCIN) 750-5 MG/150ML-% SOLN Inject 150 mLs (750 mg total) into the vein every 12 (twelve) hours. 06/04/17   Dustin Flock, MD  Vitamins/Minerals TABS Take 1 tablet every morning by mouth.  05/14/15   [provider]      VITAL SIGNS:  Blood pressure 104/71, pulse (!) 49, temperature (!) 90.3 F (32.4 C), resp. rate 20, height 5\' 10"  (1.778 m), weight 51.3 kg (113 lb), SpO2 100 %.  PHYSICAL EXAMINATION:  GENERAL:  56 y.o.-year-old patient lying in the bed , cachectic, intubated  EYES: Pupils equal, round, reactive to light and accommodation. No scleral icterus.  HEENT: Head atraumatic, normocephalic. ET tube is intact  NECK:  Supple, no jugular venous distention. No thyroid enlargement, no tenderness.  LUNGS:  Diminished breath sounds bilaterally, no wheezing, rales,rhonchi or crepitation. CARDIOVASCULAR: S1, S2 normal. No murmurs, rubs, or gallops.  ABDOMEN: Soft, nontender, nondistended. Bowel sounds present.  EXTREMITIES: No pedal edema, cyanosis, or clubbing.  NEUROLOGIC: Patient is encephalopathic  PSYCHIATRIC: The patient is concerned lymphatic. Intubated  SKIN: No obvious rash, lesion, or ulcer.   LABORATORY PANEL:   CBC Recent Labs  Lab 06/22/17 1336  WBC 13.9*  HGB 7.3*  HCT 23.4*  PLT 98*    ------------------------------------------------------------------------------------------------------------------  Chemistries  Recent Labs  Lab 06/22/17 1336  NA 145  K 2.9*  CL >130*  CO2 14*  GLUCOSE 103*  BUN 19  CREATININE 1.08  CALCIUM 8.9  AST 30  ALT 32  ALKPHOS 93  BILITOT 0.5   ------------------------------------------------------------------------------------------------------------------  Cardiac Enzymes Recent Labs  Lab 06/22/17 1336  TROPONINI <0.03   ------------------------------------------------------------------------------------------------------------------  RADIOLOGY:  Ct Head Wo Contrast  Result Date: 06/22/2017 CLINICAL DATA:  Unresponsive upon arrival with hypertension. Narcan given prior to arrival. EXAM: CT HEAD WITHOUT CONTRAST TECHNIQUE: Contiguous axial images were obtained from the base of the skull through the vertex without intravenous contrast. COMPARISON:  06/09/2017, 05/28/2017 and 08/16/2016 FINDINGS: Brain: Ventricles, cisterns and other CSF spaces are within normal. There is no mass, mass effect, shift of midline structures or acute hemorrhage. Bilateral basal ganglia calcifications are present. No evidence of acute infarction. Vascular: No hyperdense vessel or unexpected calcification. Skull: Normal. Negative for fracture or focal lesion. Sinuses/Orbits: No acute finding. Other: None. IMPRESSION: Normal head CT. Electronically Signed   By: Marin Olp M.D.   On: 06/22/2017 15:39   Ct Abdomen Pelvis W Contrast  Result Date: 06/22/2017 CLINICAL DATA:  Pt in via [facility] from home; pt unresponsive upon arrival with snoring respirations, hypotensive in the 40'J systolic. EXAM: CT ABDOMEN AND PELVIS WITH CONTRAST TECHNIQUE: Multidetector CT imaging of the abdomen and pelvis was performed using the standard protocol following bolus administration of intravenous contrast. CONTRAST:  54mL ISOVUE-300 IOPAMIDOL (ISOVUE-300) INJECTION 61%  COMPARISON:  01/23/2017 FINDINGS: Lower chest: Hazy and reticular type opacity noted in the left lower lobe of the lung base. Small areas of hazy opacity noted at the base of the left upper lobe lingula. Minor dependent subsegmental atelectasis in the right lower lobe. Heart normal in size. Hepatobiliary: 12 mm low-density lesion in segment 7, stable consistent with a hemangioma. No other liver lesions. Gallbladder is unremarkable. No bile duct dilation. Pancreas: Extensive calcifications and pancreatic atrophy consistent with chronic pancreatitis, stable. No acute inflammation. No mass. Spleen: Normal in size without focal abnormality. Adrenals/Urinary Tract: No adrenal masses. Kidneys normal size, orientation and position. No renal masses or stones. Ureters normal course and caliber. The small amount of nondependent air is noted in the bladder. No bladder mass or wall thickening. Stomach/Bowel: Since prior CT, colon surgery has been performed. There is anastomosis staple at at the rectosigmoid junction. There is no bowel dilation to suggest obstruction. There is no colonic wall thickening  or adjacent inflammation. A few diverticula are noted along the right colon. No evidence of diverticulitis. Small bowel is normal in caliber. Anastomotic staples are noted in the left upper quadrant, within the jejunum, unchanged from the prior study. No small bowel inflammation. Stomach is unremarkable. Vascular/Lymphatic: Aortic atherosclerosis. No enlarged abdominal or pelvic lymph nodes. Reproductive: Prostate mildly enlarged heterogeneous. It is stable from the prior study. Other: No ascites.  No abdominal wall hernia. Musculoskeletal: No osteoblastic or osteolytic lesions. No fracture or acute finding. IMPRESSION: 1. Small patchy areas of opacity noted in the left lower lobe and base of the left upper lobe lingula. Although this may be atelectasis, pneumonia should be considered likely in the proper clinical setting. 2. No  acute findings within the abdomen or pelvis. 3. No evidence of bowel obstruction or inflammation. 4. Status post sigmoid colon resection since the prior exam. No evidence of an operative complication. 5. Aortic atherosclerosis. Electronically Signed   By: Lajean Manes M.D.   On: 06/22/2017 15:46   Dg Chest Port 1 View  Result Date: 06/22/2017 CLINICAL DATA:  Patient unresponsive with hypotension. 2 mg narcan given. EXAM: PORTABLE CHEST 1 VIEW COMPARISON:  06/09/2017 and 08/16/2016 FINDINGS: Endotracheal tube has tip 5.5 cm above the carina. Lungs are adequately inflated without consolidation, effusion or pneumothorax. Cardiomediastinal silhouette is within normal. Old left posterior rib fractures. IMPRESSION: No active disease. Endotracheal tube with tip 5.5 cm above the carina. Electronically Signed   By: Marin Olp M.D.   On: 06/22/2017 14:55    EKG:   Orders placed or performed during the hospital encounter of 06/22/17  . ED EKG 12-Lead  . ED EKG 12-Lead    IMPRESSION AND PLAN:   Jaser Fullen  is a 56 y.o. male with a known history of insulin-dependent diabetes mellitus, recently not requiring that much insulin, colon cancer status post colon resection not following any oncologist, history of recurrent infections as reported by the family came into the ED yesterday for being hypotensive. Patient was given IV fluids and he was discharged home. Pt was acting normal until 12 am to 1am today. Today morning family members tried to arouse him as he was sleeping for long time, called EMS as patient was unarousable. EMS found him with pinpoint pupils and blood glucose was at 27. Patient was given an ampule of D50 and sugar went up to 197. 2 MG of Narcan was givenwith no response. Patient was brought into the emergency department. Patient was found to be hypotensive, hypothermic and hypoxemic. Patient was intubated. CT head is negative  #Acute hypoxic respiratory failure with possible aspiration  pneumonia Admitted to intensive care unit Patient is intubated Vent management per protocol Consult intensivist d/w dr.Simonds  # Sepsis possibly from aspiration pneumonia Patient meets criteria with leukocytosis, hypoglycemia, hypotension and hypothermia Check pro calcitonin lactic acid level is normal Empiric antibiotics Zosyn and vancomycin Blood and urine cultures were ordered Bair hugger  #Insulin-dependent diabetes mellitus with hypoglycemia Not sure how long the patient is hypoglycemic High possibility of anoxic brain damage from hypoglycemia Patient is on D5 half normal saline Monitor Accu-Cheks per protocol  #Hyperchloricemia- unclear etiology Consult nephrology Check urine lytes and osmolality  #Hypokalemia Replete with K riders and recheck Check magnesium also  # h/o colon cancer status post colon resection Not seeing oncologist CT abdomen and pelvis with no acute findings. CT head is negative  GI prophylaxis with Protonix DVT prophylaxis with Lovenox subcutaneous  Critically sick with high mortality  Very poor prognosis  All the records are reviewed and case discussed with ED provider. Management plans discussed with the patient's sister and friend at bedside. They are aware that patient's situation is critical with poor prognosis and they are in agreement.    CODE STATUS: fc ,sister Ashley Mariner is HCPOA  TOTAL CRITICAL CARE TIME TAKING CARE OF THIS PATIENT: 43  minutes.   Note: This dictation was prepared with Dragon dictation along with smaller phrase technology. Any transcriptional errors that result from this process are unintentional.  Nicholes Mango M.D on 06/22/2017 at 4:25 PM  Between 7am to 6pm - Pager - [phone]  After 6pm go to [email or web address] - password [email or web address] [place] [facility]  Office  [phone]  CC: Primary care physician; [name], MD

## 2017-06-23 ENCOUNTER — Inpatient Hospital Stay: Payer: Medicaid Other

## 2017-06-23 DIAGNOSIS — D649 Anemia, unspecified: Secondary | ICD-10-CM

## 2017-06-23 LAB — HEPATIC FUNCTION PANEL
ALBUMIN: 1.9 g/dL — AB (ref 3.5–5.0)
ALK PHOS: 86 U/L (ref 38–126)
ALT: 28 U/L (ref 17–63)
AST: 35 U/L (ref 15–41)
Bilirubin, Direct: 0.1 mg/dL (ref 0.1–0.5)
Indirect Bilirubin: 0.7 mg/dL (ref 0.3–0.9)
TOTAL PROTEIN: 5.4 g/dL — AB (ref 6.5–8.1)
Total Bilirubin: 0.8 mg/dL (ref 0.3–1.2)

## 2017-06-23 LAB — BASIC METABOLIC PANEL
ANION GAP: 2 — AB (ref 5–15)
ANION GAP: 2 — AB (ref 5–15)
ANION GAP: 3 — AB (ref 5–15)
ANION GAP: 3 — AB (ref 5–15)
BUN: 13 mg/dL (ref 6–20)
BUN: 12 mg/dL (ref 6–20)
BUN: 11 mg/dL (ref 6–20)
BUN: 12 mg/dL (ref 6–20)
CHLORIDE: 118 mmol/L — AB (ref 101–111)
CHLORIDE: 115 mmol/L — AB (ref 101–111)
CO2: 13 mmol/L — ABNORMAL LOW (ref 22–32)
CO2: 15 mmol/L — ABNORMAL LOW (ref 22–32)
CO2: 18 mmol/L — ABNORMAL LOW (ref 22–32)
CO2: 21 mmol/L — ABNORMAL LOW (ref 22–32)
Calcium: 7.6 mg/dL — ABNORMAL LOW (ref 8.9–10.3)
Calcium: 7.8 mg/dL — ABNORMAL LOW (ref 8.9–10.3)
Calcium: 7.8 mg/dL — ABNORMAL LOW (ref 8.9–10.3)
Calcium: 7.2 mg/dL — ABNORMAL LOW (ref 8.9–10.3)
Chloride: 121 mmol/L — ABNORMAL HIGH (ref 101–111)
Chloride: 122 mmol/L — ABNORMAL HIGH (ref 101–111)
Creatinine, Ser: 1.03 mg/dL (ref 0.61–1.24)
Creatinine, Ser: 0.95 mg/dL (ref 0.61–1.24)
Creatinine, Ser: 1.01 mg/dL (ref 0.61–1.24)
Creatinine, Ser: 0.93 mg/dL (ref 0.61–1.24)
GFR calc Af Amer: >60 mL/min (ref >60)
GFR calc Af Amer: >60 mL/min (ref >60)
GFR calc Af Amer: >60 mL/min (ref >60)
GFR calc Af Amer: >60 mL/min (ref >60)
GFR calc non Af Amer: >60 mL/min (ref >60)
GFR calc non Af Amer: >60 mL/min (ref >60)
GLUCOSE: 212 mg/dL — AB (ref 65–99)
GLUCOSE: 171 mg/dL — AB (ref 65–99)
Glucose, Bld: 99 mg/dL (ref 65–99)
Glucose, Bld: 243 mg/dL — ABNORMAL HIGH (ref 65–99)
POTASSIUM: 2.8 mmol/L — AB (ref 3.5–5.1)
POTASSIUM: 3.3 mmol/L — AB (ref 3.5–5.1)
POTASSIUM: 3.1 mmol/L — AB (ref 3.5–5.1)
POTASSIUM: 2.8 mmol/L — AB (ref 3.5–5.1)
SODIUM: 139 mmol/L (ref 135–145)
SODIUM: 139 mmol/L (ref 135–145)
SODIUM: 139 mmol/L (ref 135–145)
Sodium: 136 mmol/L (ref 135–145)

## 2017-06-23 LAB — TYPE AND SCREEN
ABO/RH(D): A POS
ANTIBODY SCREEN: NEGATIVE
Unit division: 0

## 2017-06-23 LAB — CBC
HCT: 25.1 % — ABNORMAL LOW (ref 40.0–52.0)
HEMATOCRIT: 24.1 % — AB (ref 40.0–52.0)
HEMOGLOBIN: 8.1 g/dL — AB (ref 13.0–18.0)
Hemoglobin: 7.6 g/dL — ABNORMAL LOW (ref 13.0–18.0)
MCH: 28.9 pg (ref 26.0–34.0)
MCH: 29.4 pg (ref 26.0–34.0)
MCHC: 31.7 g/dL — ABNORMAL LOW (ref 32.0–36.0)
MCHC: 32.2 g/dL (ref 32.0–36.0)
MCV: 91.3 fL (ref 80.0–100.0)
MCV: 91.4 fL (ref 80.0–100.0)
Platelets: 84 10*3/uL — ABNORMAL LOW (ref 150–440)
Platelets: 86 10*3/uL — ABNORMAL LOW (ref 150–440)
RBC: 2.64 MIL/uL — AB (ref 4.40–5.90)
RBC: 2.75 MIL/uL — AB (ref 4.40–5.90)
RDW: 15.4 % — AB (ref 11.5–14.5)
RDW: 15.5 % — ABNORMAL HIGH (ref 11.5–14.5)
WBC: 11.8 10*3/uL — AB (ref 3.8–10.6)
WBC: 11.4 10*3/uL — ABNORMAL HIGH (ref 3.8–10.6)

## 2017-06-23 LAB — BPAM RBC
BLOOD PRODUCT EXPIRATION DATE: 201901012359
ISSUE DATE / TIME: 201812242133
UNIT TYPE AND RH: 600

## 2017-06-23 LAB — GLUCOSE, CAPILLARY
GLUCOSE-CAPILLARY: 107 mg/dL — AB (ref 65–99)
GLUCOSE-CAPILLARY: 148 mg/dL — AB (ref 65–99)
GLUCOSE-CAPILLARY: 41 mg/dL — AB (ref 65–99)
GLUCOSE-CAPILLARY: 81 mg/dL (ref 65–99)
Glucose-Capillary: 88 mg/dL (ref 65–99)
Glucose-Capillary: 94 mg/dL (ref 65–99)
Glucose-Capillary: 94 mg/dL (ref 65–99)
Glucose-Capillary: 110 mg/dL — ABNORMAL HIGH (ref 65–99)
Glucose-Capillary: 216 mg/dL — ABNORMAL HIGH (ref 65–99)
Glucose-Capillary: 302 mg/dL — ABNORMAL HIGH (ref 65–99)

## 2017-06-23 LAB — OSMOLALITY, URINE: Osmolality, Ur: 395 mOsm/kg (ref 300–900)

## 2017-06-23 LAB — OSMOLALITY: OSMOLALITY: 301 mosm/kg — AB (ref 275–295)

## 2017-06-23 LAB — MAGNESIUM: MAGNESIUM: 2 mg/dL (ref 1.7–2.4)

## 2017-06-23 LAB — POTASSIUM: POTASSIUM: 3.4 mmol/L — AB (ref 3.5–5.1)

## 2017-06-23 LAB — PROCALCITONIN

## 2017-06-23 MED ORDER — SODIUM BICARBONATE 8.4 % IV SOLN
INTRAVENOUS | Status: DC
  Administered 2017-06-23: 22:00:00 via INTRAVENOUS
  Filled 2017-06-23 (×3): qty 1000

## 2017-06-23 MED ORDER — FENTANYL CITRATE (PF) 100 MCG/2ML IJ SOLN
50.0000 ug | INTRAMUSCULAR | Status: AC | PRN
  Administered 2017-06-23 (×3): 50 ug via INTRAVENOUS
  Filled 2017-06-23 (×2): qty 2

## 2017-06-23 MED ORDER — FENTANYL CITRATE (PF) 100 MCG/2ML IJ SOLN: 50.0000 ug | INTRAMUSCULAR | Status: DC | PRN

## 2017-06-23 MED ORDER — SODIUM CHLORIDE 0.9 % IV SOLN
1000.0000 mg | Freq: Two times a day (BID) | INTRAVENOUS | Status: DC
  Administered 2017-06-23 – 2017-06-30 (×14): 1000 mg via INTRAVENOUS
  Filled 2017-06-23 (×15): qty 10

## 2017-06-23 MED ORDER — PRO-STAT SUGAR FREE PO LIQD
30.0000 mL | Freq: Two times a day (BID) | ORAL | Status: DC
  Administered 2017-06-23 – 2017-07-01 (×16): 30 mL

## 2017-06-23 MED ORDER — MIDAZOLAM HCL 2 MG/2ML IJ SOLN
2.0000 mg | INTRAMUSCULAR | Status: DC | PRN
  Administered 2017-06-23 – 2017-06-24 (×5): 2 mg via INTRAVENOUS
  Filled 2017-06-23 (×5): qty 2

## 2017-06-23 MED ORDER — VITAL HIGH PROTEIN PO LIQD
1000.0000 mL | ORAL | Status: DC
  Administered 2017-06-23: 1000 mL

## 2017-06-23 MED ORDER — ORAL CARE MOUTH RINSE
15.0000 mL | OROMUCOSAL | Status: DC
  Administered 2017-06-23 – 2017-06-28 (×45): 15 mL via OROMUCOSAL

## 2017-06-23 MED ORDER — DEXTROSE 50 % IV SOLN
INTRAVENOUS | Status: AC
  Administered 2017-06-24: 25 g
  Administered 2017-06-24
  Filled 2017-06-23: qty 50

## 2017-06-23 MED ORDER — INSULIN ASPART 100 UNIT/ML ~~LOC~~ SOLN
0.0000 [IU] | SUBCUTANEOUS | Status: DC
  Administered 2017-06-23: 11 [IU] via SUBCUTANEOUS

## 2017-06-23 MED ORDER — INSULIN ASPART 100 UNIT/ML ~~LOC~~ SOLN
SUBCUTANEOUS | Status: AC
  Administered 2017-06-23: 11 [IU] via SUBCUTANEOUS
  Filled 2017-06-23: qty 1

## 2017-06-23 MED ORDER — POTASSIUM CHLORIDE 10 MEQ/50ML IV SOLN
10.0000 meq | INTRAVENOUS | Status: AC
  Administered 2017-06-23 – 2017-06-24 (×6): 10 meq via INTRAVENOUS
  Filled 2017-06-23 (×6): qty 50

## 2017-06-23 NOTE — Progress Notes (Signed)
PULMONARY / CRITICAL CARE MEDICINE   Name: [name] MRN: [id number] DOB: Nov 10, 1960    ADMISSION DATE:  06/22/2017 CONSULTATION DATE:  12/24  REFERRING MD: Margaretmary Eddy  PT PROFILE:   8 M with hx of recently resected colon cancer (04/17/17 at [facility]) and recent MRSA Port-A-Cath infection with bacteremia brought to [facility] ED by EMS for altered cognition and intubated in the ED for same.  Was found to be profoundly hypoglycemic by EMS.  Received dextrose without improvement in mental status.  Also received naloxone without improvement in cognition.   MAJOR EVENTS/TEST RESULTS: 12/24 admission via ED as noted above.  Transfused 1 unit PRBCs in the ED for hemoglobin 7.3.  Bicarbonate infusion initiated for profound hyperchloremic acidosis 12/24 CT head: No acute findings 12/24 CTAP: No acute findings in the abdomen or pelvis.  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 Rec continued HCO3 infusion 12/25 Persistent coma despite no sedatives. Neurology consultation requested. MRI brain ordered 12/25 MRI brain:   INDWELLING DEVICES:: ETT 12/24 >>  L IJ CVL 12/24 >>   MICRO DATA: Urine 12/24 >>  Resp 12/24 >>  Blood work 12/24 >>   ANTIMICROBIALS:  Vancomycin 12/24 >>  Pip-tazo 12/24 >>      SUBJECTIVE:  Comatose. Chewing on ETT. No spontaneous movement. No withdrawal.   VITAL SIGNS: BP 100/63   Pulse 76   Temp (!) 100.4 F (38 C)   Resp 20   Ht 5\' 10"  (1.778 m)   Wt 51.3 kg (113 lb)   SpO2 98%   BMI 16.21 kg/m   HEMODYNAMICS:    VENTILATOR SETTINGS: Vent Mode: PRVC FiO2 (%):  [24 %-40 %] 24 % Set Rate:  [14 bmp] 14 bmp Vt Set:  [50 mL-500 mL] 500 mL PEEP:  [5 cmH20] 5 cmH20 Plateau Pressure:  [10 cmH20] 10 cmH20  INTAKE / OUTPUT: I/O last 3 completed shifts: In: 4627.4 [I.V.:1277.4; IV Piggyback:3350] Out: 2050 [Urine:2050]  PHYSICAL EXAMINATION: General: Chronically ill-appearing, intubated,  deeply unresponsive Neuro: gaze deviated down and R, no withdrawal, no spontaneous movement HEENT: NCAT, sclerae white Cardiovascular: Regular, no M Lungs: Clear anteriorly with full breath sounds Abdomen: Scaphoid, diminished bowel sounds, no palpable masses Ext: warm, no edema  LABS:  BMET Recent Labs  Lab 06/22/17 2129 06/23/17 0133 06/23/17 0444 06/23/17 0729  NA 140 136 139  --   K 3.2* 2.8* 3.3* 3.4*  CL 127* 121* 122*  --   CO2 12* 13* 15*  --   BUN 14 13 12   --   CREATININE 0.97 1.03 0.95  --   GLUCOSE 84 212* 99  --     Electrolytes Recent Labs  Lab 06/22/17 1815 06/22/17 2129 06/23/17 0133 06/23/17 0444  CALCIUM 7.5* 7.6* 7.6* 7.8*  MG 1.3*  --   --  2.0    CBC Recent Labs  Lab 06/22/17 1815 06/23/17 0133 06/23/17 0444  WBC 12.9* 11.8* 11.4*  HGB 6.5* 7.6* 8.1*  HCT 21.1* 24.1* 25.1*  PLT 82* 84* 86*    Coag's Recent Labs  Lab 06/22/17 1815  APTT 43*  INR 1.32    Sepsis Markers Recent Labs  Lab 06/22/17 1334 06/22/17 1815 06/22/17 2129 06/23/17 0444  LATICACIDVEN 1.1 0.8 1.0  --   PROCALCITON 0.11 <0.10  --  <0.10    ABG Recent Labs  Lab 06/22/17 1424  PHART 7.23*  PCO2ART 24*  PO2ART 233*    Liver Enzymes  Recent Labs  Lab 06/17/17 1307 06/22/17 1336 06/23/17 0444  AST 54* 30 35  ALT 47 32 28  ALKPHOS 149* 93 86  BILITOT 0.4 0.5 0.8  ALBUMIN 2.5* 2.1* 1.9*    Cardiac Enzymes Recent Labs  Lab 06/22/17 1336  TROPONINI <0.03    Glucose Recent Labs  Lab 06/23/17 0230 06/23/17 0338 06/23/17 0504 06/23/17 0636 06/23/17 0745 06/23/17 1118  GLUCAP 94 81 94 107* 110* 148*    CXR: No acute cardiac or pulmonary findings    ASSESSMENT / PLAN:  PULMONARY A: Acute respiratory failure due to coma P:   Cont full vent support - settings reviewed and/or adjusted Cont vent bundle Daily SBT if/when meets criteria   CARDIOVASCULAR A:  Hypotension, resolved with IVF's Sinus bradycardia, resolved P:   Monitor BP and rhythm Norepi as needed to maintain MAP > 60 mmHg  RENAL A:   AKI Severe hyperchloremic acidosis Hypokalemia Hypernatremia, resolved P:   Monitor BMET intermittently Monitor I/Os Correct electrolytes as indicated D5W + HCO3 + KCl ordered  GASTROINTESTINAL A:   Recent bowel resection Chronic pancreatitis and pancreatic insufficiency P:   SUP: IV PPI TF protocol initiated 12/25  HEMATOLOGIC A:   Acute on chronic anemia without overt blood loss Thrombocytopenia  P:  DVT px: SCDs Monitor CBC intermittently Transfuse per usual guidelines   INFECTIOUS A:   Possible severe sepsis Possible left lower lobe pneumonia Pyuria P:   Monitor temp, WBC count Micro and abx as above   ENDOCRINE A:   Type 2 diabetes Severe hypoglycemia - resolved P:   Monitor CBGs  IVFs adjsuted  NEUROLOGIC A:   Coma - suspect brain injury due to profound and prolonged hypoglycemia P:   RASS goal: 0 DC all sedatives Neurology consultation requested   FAMILY  - Updates: will update family when they arrive  CCM time: 40 mins  The above time includes time spent in consultation with patient and/or family members and reviewing care plan on multidisciplinary rounds  Merton Border, MD PCCM service Mobile [phone] Pager [phone]   06/23/2017, 12:37 PM

## 2017-06-23 NOTE — Progress Notes (Addendum)
Remains non sedated,ventilated and now on tube feeding per OG. Does not follow commands. 1100 Bare hugger turned off temperature 100.4. Does not withdraw to pain. Constantly has chewing motion. Spits out oral airways.Medicated x 1 for severe agitation.1800 Core temperature 95.54 bare hugger restarted at medium heat

## 2017-06-23 NOTE — Progress Notes (Signed)
[facility] NAME: [name]    MR#:  [id number]  DATE OF BIRTH:  Feb 12, 1961  SUBJECTIVE:  CHIEF COMPLAINT:   Chief Complaint  Patient presents with  . Altered Mental Status   ON vent. Unresponsive  REVIEW OF SYSTEMS:    Review of Systems  Unable to perform ROS: Intubated   DRUG ALLERGIES:   Allergies  Allergen Reactions  . Aspirin Itching and Other (See Comments)    Reaction: abdominal pain    VITALS:  Blood pressure 100/63, pulse 76, temperature (!) 100.4 F (38 C), resp. rate 20, height 5\' 10"  (1.778 m), weight 51.3 kg (113 lb), SpO2 98 %.  PHYSICAL EXAMINATION:   Physical Exam  GENERAL:  56 y.o.-year-old patient lying in the bed intubated HEENT: ETT tube  LUNGS: Normal breath sounds bilaterally CARDIOVASCULAR: S1, S2 ABDOMEN: Soft, , nondistended. Bowel sounds present. EXTREMITIES: No cyanosis   PSYCHIATRIC: The patient is unresponsive  LABORATORY PANEL:   CBC Recent Labs  Lab 06/23/17 0444  WBC 11.4*  HGB 8.1*  HCT 25.1*  PLT 86*   ------------------------------------------------------------------------------------------------------------------ Chemistries  Recent Labs  Lab 06/23/17 0444 06/23/17 0729  NA 139  --   K 3.3* 3.4*  CL 122*  --   CO2 15*  --   GLUCOSE 99  --   BUN 12  --   CREATININE 0.95  --   CALCIUM 7.8*  --   MG 2.0  --   AST 35  --   ALT 28  --   ALKPHOS 86  --   BILITOT 0.8  --    ------------------------------------------------------------------------------------------------------------------  Cardiac Enzymes Recent Labs  Lab 06/22/17 1336  TROPONINI <0.03   ------------------------------------------------------------------------------------------------------------------  RADIOLOGY:  Dg Abd 1 View  Result Date: 06/23/2017 CLINICAL DATA:  Orogastric tube placement. EXAM: ABDOMEN - 1 VIEW COMPARISON:  CT of the abdomen and pelvis performed 06/22/2017  FINDINGS: The patient's enteric tube is noted ending overlying the body of the stomach. The stomach is decompressed. The visualized bowel gas pattern is unremarkable. Scattered air and stool filled loops of colon are seen; no abnormal dilatation of small bowel loops is seen to suggest small bowel obstruction. No free intra-abdominal air is identified, though evaluation for free air is limited on a single supine view. The visualized osseous structures are within normal limits; the sacroiliac joints are unremarkable in appearance. The visualized lung bases are essentially clear. IMPRESSION: Enteric tube noted ending overlying the body of the stomach. Electronically Signed   By: Garald Balding M.D.   On: 06/23/2017 04:59   Ct Head Wo Contrast  Result Date: 06/22/2017 CLINICAL DATA:  Unresponsive upon arrival with hypertension. Narcan given prior to arrival. EXAM: CT HEAD WITHOUT CONTRAST TECHNIQUE: Contiguous axial images were obtained from the base of the skull through the vertex without intravenous contrast. COMPARISON:  06/09/2017, 05/28/2017 and 08/16/2016 FINDINGS: Brain: Ventricles, cisterns and other CSF spaces are within normal. There is no mass, mass effect, shift of midline structures or acute hemorrhage. Bilateral basal ganglia calcifications are present. No evidence of acute infarction. Vascular: No hyperdense vessel or unexpected calcification. Skull: Normal. Negative for fracture or focal lesion. Sinuses/Orbits: No acute finding. Other: None. IMPRESSION: Normal head CT. Electronically Signed   By: Marin Olp M.D.   On: 06/22/2017 15:39   Ct Abdomen Pelvis W Contrast  Result Date: 06/22/2017 CLINICAL DATA:  Pt in via [facility] from home; pt unresponsive upon arrival with snoring respirations, hypotensive  in the 10'G systolic. EXAM: CT ABDOMEN AND PELVIS WITH CONTRAST TECHNIQUE: Multidetector CT imaging of the abdomen and pelvis was performed using the standard protocol following bolus  administration of intravenous contrast. CONTRAST:  61mL ISOVUE-300 IOPAMIDOL (ISOVUE-300) INJECTION 61% COMPARISON:  01/23/2017 FINDINGS: Lower chest: Hazy and reticular type opacity noted in the left lower lobe of the lung base. Small areas of hazy opacity noted at the base of the left upper lobe lingula. Minor dependent subsegmental atelectasis in the right lower lobe. Heart normal in size. Hepatobiliary: 12 mm low-density lesion in segment 7, stable consistent with a hemangioma. No other liver lesions. Gallbladder is unremarkable. No bile duct dilation. Pancreas: Extensive calcifications and pancreatic atrophy consistent with chronic pancreatitis, stable. No acute inflammation. No mass. Spleen: Normal in size without focal abnormality. Adrenals/Urinary Tract: No adrenal masses. Kidneys normal size, orientation and position. No renal masses or stones. Ureters normal course and caliber. The small amount of nondependent air is noted in the bladder. No bladder mass or wall thickening. Stomach/Bowel: Since prior CT, colon surgery has been performed. There is anastomosis staple at at the rectosigmoid junction. There is no bowel dilation to suggest obstruction. There is no colonic wall thickening or adjacent inflammation. A few diverticula are noted along the right colon. No evidence of diverticulitis. Small bowel is normal in caliber. Anastomotic staples are noted in the left upper quadrant, within the jejunum, unchanged from the prior study. No small bowel inflammation. Stomach is unremarkable. Vascular/Lymphatic: Aortic atherosclerosis. No enlarged abdominal or pelvic lymph nodes. Reproductive: Prostate mildly enlarged heterogeneous. It is stable from the prior study. Other: No ascites.  No abdominal wall hernia. Musculoskeletal: No osteoblastic or osteolytic lesions. No fracture or acute finding. IMPRESSION: 1. Small patchy areas of opacity noted in the left lower lobe and base of the left upper lobe lingula.  Although this may be atelectasis, pneumonia should be considered likely in the proper clinical setting. 2. No acute findings within the abdomen or pelvis. 3. No evidence of bowel obstruction or inflammation. 4. Status post sigmoid colon resection since the prior exam. No evidence of an operative complication. 5. Aortic atherosclerosis. Electronically Signed   By: Lajean Manes M.D.   On: 06/22/2017 15:46   Dg Chest Port 1 View  Result Date: 06/23/2017 CLINICAL DATA:  Acute onset of respiratory failure. EXAM: PORTABLE CHEST 1 VIEW COMPARISON:  Chest radiograph performed 06/22/2017 FINDINGS: The patient's endotracheal tube is seen ending 3-4 cm above the carina. A left IJ line is noted ending about the mid SVC. An enteric tube is noted ending overlying the body of the stomach. The lungs are well-aerated and clear. There is no evidence of focal opacification, pleural effusion or pneumothorax. The cardiomediastinal silhouette is within normal limits. No acute osseous abnormalities are seen. Chronic left-sided rib deformities are noted. IMPRESSION: 1. Endotracheal tube seen ending 3-4 cm above the carina. 2. No acute cardiopulmonary process seen. Electronically Signed   By: Garald Balding M.D.   On: 06/23/2017 05:00   Dg Chest Port 1 View  Result Date: 06/22/2017 CLINICAL DATA:  Central line placement EXAM: PORTABLE CHEST 1 VIEW COMPARISON:  06/22/2017, 06/09/2017 FINDINGS: Endotracheal tube tip is about 2.6 cm superior to the carina. Left-sided central venous catheter tip projects over the SVC. No left pneumothorax is seen. Increasing opacity at the left base. No pleural effusion. Normal heart size. Old bilateral rib fractures. IMPRESSION: 1. Left-sided central venous catheter tip overlies the SVC. Negative for pneumothorax 2. Increasing atelectasis or infiltrate  at the left lung base. Electronically Signed   By: Donavan Foil M.D.   On: 06/22/2017 20:51   Dg Chest Port 1 View  Result Date:  06/22/2017 CLINICAL DATA:  Patient unresponsive with hypotension. 2 mg narcan given. EXAM: PORTABLE CHEST 1 VIEW COMPARISON:  06/09/2017 and 08/16/2016 FINDINGS: Endotracheal tube has tip 5.5 cm above the carina. Lungs are adequately inflated without consolidation, effusion or pneumothorax. Cardiomediastinal silhouette is within normal. Old left posterior rib fractures. IMPRESSION: No active disease. Endotracheal tube with tip 5.5 cm above the carina. Electronically Signed   By: Marin Olp M.D.   On: 06/22/2017 14:55   ASSESSMENT AND PLAN:   #Acute hypoxic respiratory failure with possible aspiration pneumonia and sepsis Continue full vent support IV abx nebs Cx pending  # Uncontrolled  diabetes mellitus with hypoglycemia Extended period of hypoglycemic. Patient is off sedation and not awake.  # h/o colon cancer status post colon resection CT abdomen and pelvis with no acute findings.  Poor prognosis. Discussed with Dr. Alva Garnet.  All the records are reviewed and case discussed with Care Management/Social Worker Management plans discussed with the patient, family and they are in agreement.  CODE STATUS: FULL CODE  DVT Prophylaxis: SCDs  TOTAL TIME TAKING CARE OF THIS PATIENT: 30 minutes.   Leia Alf Shariyah Eland M.D on 06/23/2017 at 12:52 PM  Between 7am to 6pm - Pager - [phone]  After 6pm go to [email or web address] - password [email or web address] [facility]  Office  [phone]  CC: Primary care physician; [name], MD  Note: This dictation was prepared with Dragon dictation along with smaller phrase technology. Any transcriptional errors that result from this process are unintentional.

## 2017-06-23 NOTE — Progress Notes (Signed)
[place] for Vancomycin and Zosyn Indication: sepsis  Allergies  Allergen Reactions  . Aspirin Itching and Other (See Comments)    Reaction: abdominal pain    Patient Measurements: Height: '5\' 10"'$  (177.8 cm) Weight: 113 lb (51.3 kg) IBW/kg (Calculated) : 73 Adjusted Body Weight:   Vital Signs: Temp: 100.4 F (38 C) (12/25 1100) BP: 100/63 (12/25 1100) Pulse Rate: 76 (12/25 1100) Intake/Output from previous day: 12/24 0701 - 12/25 0700 In: 4627.4 [I.V.:1277.4; IV Piggyback:3350] Out: 2050 [Urine:2050] Intake/Output from this shift: Total I/O In: 862.3 [I.V.:795; NG/GT:17.3; IV Piggyback:50] Out: 600 [Urine:600] Labs: Recent Labs    06/22/17 1815  06/23/17 0133 06/23/17 0444 06/23/17 1212  WBC 12.9*  --  11.8* 11.4*  --   HGB 6.5*  --  7.6* 8.1*  --   PLT 82*  --  84* 86*  --   CREATININE 0.94   < > 1.03 0.95 1.01   < > = values in this interval not displayed.   Estimated Creatinine Clearance: 58.6 mL/min (by C-G formula based on SCr of 1.01 mg/dL). No results for input(s): VANCOTROUGH, VANCOPEAK, VANCORANDOM, GENTTROUGH, GENTPEAK, GENTRANDOM, TOBRATROUGH, TOBRAPEAK, TOBRARND, AMIKACINPEAK, AMIKACINTROU, AMIKACIN in the last 72 hours.   Microbiology: Recent Results (from the past 720 hour(s))  Culture, blood (Routine x 2)     Status: Abnormal   Collection Time: 05/28/17  6:21 PM  Result Value Ref Range Status   Specimen Description BLOOD BLOOD LEFT ARM  Final   Special Requests   Final    BOTTLES DRAWN AEROBIC AND ANAEROBIC Blood Culture adequate volume   Culture  Setup Time   Final    GRAM POSITIVE COCCI IN BOTH AEROBIC AND ANAEROBIC BOTTLES CRITICAL RESULT CALLED TO, READ BACK BY AND VERIFIED WITH: Kyleigha Markert AT 0375 05/29/17 SDR    Culture METHICILLIN RESISTANT STAPHYLOCOCCUS AUREUS (A)  Final   Report Status 05/31/2017 FINAL  Final   Organism ID, Bacteria METHICILLIN RESISTANT STAPHYLOCOCCUS AUREUS  Final       Susceptibility   Methicillin resistant staphylococcus aureus - MIC*    CIPROFLOXACIN >=8 RESISTANT Resistant     ERYTHROMYCIN >=8 RESISTANT Resistant     GENTAMICIN <=0.5 SENSITIVE Sensitive     OXACILLIN >=4 RESISTANT Resistant     TETRACYCLINE <=1 SENSITIVE Sensitive     VANCOMYCIN <=0.5 SENSITIVE Sensitive     TRIMETH/SULFA <=10 SENSITIVE Sensitive     CLINDAMYCIN RESISTANT Resistant     RIFAMPIN <=0.5 SENSITIVE Sensitive     Inducible Clindamycin POSITIVE Resistant     * METHICILLIN RESISTANT STAPHYLOCOCCUS AUREUS  Blood Culture ID Panel (Reflexed)     Status: Abnormal   Collection Time: 05/28/17  6:21 PM  Result Value Ref Range Status   Enterococcus species NOT DETECTED NOT DETECTED Final   Listeria monocytogenes NOT DETECTED NOT DETECTED Final   Staphylococcus species DETECTED (A) NOT DETECTED Final    Comment: CRITICAL RESULT CALLED TO, READ BACK BY AND VERIFIED WITH:  Jowan Skillin AT [id number] 05/29/17 SDR    Staphylococcus aureus DETECTED (A) NOT DETECTED Final    Comment: Methicillin (oxacillin)-resistant Staphylococcus aureus (MRSA). MRSA is predictably resistant to beta-lactam antibiotics (except ceftaroline). Preferred therapy is vancomycin unless clinically contraindicated. Patient requires contact precautions if  hospitalized. CRITICAL RESULT CALLED TO, READ BACK BY AND VERIFIED WITH:  Rino Hosea AT [phone] 05/29/17 SDR    Methicillin resistance DETECTED (A) NOT DETECTED Final    Comment: CRITICAL RESULT CALLED TO, READ BACK BY AND  VERIFIED WITH:  Christasia Angeletti AT 2774 05/29/17 SDR    Streptococcus species NOT DETECTED NOT DETECTED Final   Streptococcus agalactiae NOT DETECTED NOT DETECTED Final   Streptococcus pneumoniae NOT DETECTED NOT DETECTED Final   Streptococcus pyogenes NOT DETECTED NOT DETECTED Final   Acinetobacter baumannii NOT DETECTED NOT DETECTED Final   Enterobacteriaceae species NOT DETECTED NOT DETECTED Final   Enterobacter cloacae complex NOT  DETECTED NOT DETECTED Final   Escherichia coli NOT DETECTED NOT DETECTED Final   Klebsiella oxytoca NOT DETECTED NOT DETECTED Final   Klebsiella pneumoniae NOT DETECTED NOT DETECTED Final   Proteus species NOT DETECTED NOT DETECTED Final   Serratia marcescens NOT DETECTED NOT DETECTED Final   Haemophilus influenzae NOT DETECTED NOT DETECTED Final   Neisseria meningitidis NOT DETECTED NOT DETECTED Final   Pseudomonas aeruginosa NOT DETECTED NOT DETECTED Final   Candida albicans NOT DETECTED NOT DETECTED Final   Candida glabrata NOT DETECTED NOT DETECTED Final   Candida krusei NOT DETECTED NOT DETECTED Final   Candida parapsilosis NOT DETECTED NOT DETECTED Final   Candida tropicalis NOT DETECTED NOT DETECTED Final  Culture, blood (Routine x 2)     Status: Abnormal   Collection Time: 05/28/17  7:10 PM  Result Value Ref Range Status   Specimen Description BLOOD LEFT ANTECUBITAL  Final   Special Requests   Final    BOTTLES DRAWN AEROBIC AND ANAEROBIC Blood Culture adequate volume   Culture  Setup Time   Final    GRAM POSITIVE COCCI IN BOTH AEROBIC AND ANAEROBIC BOTTLES CRITICAL VALUE NOTED.  VALUE IS CONSISTENT WITH PREVIOUSLY REPORTED AND CALLED VALUE.    Culture (A)  Final    STAPHYLOCOCCUS AUREUS SUSCEPTIBILITIES PERFORMED ON PREVIOUS CULTURE WITHIN THE LAST 5 DAYS. Performed at [facility], [place] [address], [place], [place] [address]    Report Status 05/31/2017 FINAL  Final  Surgical pcr screen     Status: Abnormal   Collection Time: 05/28/17 10:44 PM  Result Value Ref Range Status   MRSA, PCR POSITIVE (A) NEGATIVE Final    Comment: RESULT CALLED TO, READ BACK BY AND VERIFIED WITH: MAT PAGE ON 05/29/17 AT 6767 BY JAG    Staphylococcus aureus POSITIVE (A) NEGATIVE Final    Comment: RESULT CALLED TO, READ BACK BY AND VERIFIED WITH: MAT PAGE ON 05/29/17 AT 0213 BY JAG (NOTE) The Xpert SA Assay (FDA approved for NASAL specimens in patients 39 years of age and older), is  one component of a comprehensive surveillance program. It is not intended to diagnose infection nor to guide or monitor treatment.   Aerobic/Anaerobic Culture (surgical/deep wound)     Status: None   Collection Time: 05/29/17  1:31 PM  Result Value Ref Range Status   Specimen Description WOUND RIGHT UPPER CHEST  Final   Special Requests NONE  Final   Gram Stain   Final    FEW WBC PRESENT, PREDOMINANTLY PMN FEW GRAM POSITIVE COCCI IN CLUSTERS    Culture   Final    FEW METHICILLIN RESISTANT STAPHYLOCOCCUS AUREUS NO ANAEROBES ISOLATED Performed at [facility], [place] [address], [place], [place] [address]    Report Status 06/03/2017 FINAL  Final   Organism ID, Bacteria METHICILLIN RESISTANT STAPHYLOCOCCUS AUREUS  Final      Susceptibility   Methicillin resistant staphylococcus aureus - MIC*    CIPROFLOXACIN >=8 RESISTANT Resistant     ERYTHROMYCIN >=8 RESISTANT Resistant     GENTAMICIN <=0.5 SENSITIVE Sensitive  OXACILLIN >=4 RESISTANT Resistant     TETRACYCLINE <=1 SENSITIVE Sensitive     VANCOMYCIN <=0.5 SENSITIVE Sensitive     TRIMETH/SULFA <=10 SENSITIVE Sensitive     CLINDAMYCIN RESISTANT Resistant     RIFAMPIN <=0.5 SENSITIVE Sensitive     Inducible Clindamycin POSITIVE Resistant     * FEW METHICILLIN RESISTANT STAPHYLOCOCCUS AUREUS  Culture, blood (single) w Reflex to ID Panel     Status: None   Collection Time: 05/29/17  6:06 PM  Result Value Ref Range Status   Specimen Description BLOOD LEFT ASSIST CONTROL  Final   Special Requests   Final    BOTTLES DRAWN AEROBIC AND ANAEROBIC Blood Culture adequate volume   Culture NO GROWTH 5 DAYS  Final   Report Status 06/03/2017 FINAL  Final  CULTURE, BLOOD (ROUTINE X 2) w Reflex to ID Panel     Status: None   Collection Time: 05/30/17 11:35 AM  Result Value Ref Range Status   Specimen Description BLOOD LEFT ASSIST CONTROL  Final   Special Requests   Final    BOTTLES DRAWN AEROBIC AND ANAEROBIC Blood Culture adequate  volume   Culture NO GROWTH 5 DAYS  Final   Report Status 06/04/2017 FINAL  Final  CULTURE, BLOOD (ROUTINE X 2) w Reflex to ID Panel     Status: None   Collection Time: 05/30/17 11:59 AM  Result Value Ref Range Status   Specimen Description BLOOD LEFT HAND  Final   Special Requests   Final    BOTTLES DRAWN AEROBIC AND ANAEROBIC Blood Culture adequate volume   Culture NO GROWTH 5 DAYS  Final   Report Status 06/04/2017 FINAL  Final  CULTURE, BLOOD (ROUTINE X 2) w Reflex to ID Panel     Status: None   Collection Time: 05/31/17  5:01 AM  Result Value Ref Range Status   Specimen Description BLOOD LEFT ANTECUBITAL  Final   Special Requests   Final    BOTTLES DRAWN AEROBIC AND ANAEROBIC Blood Culture adequate volume   Culture NO GROWTH 5 DAYS  Final   Report Status 06/05/2017 FINAL  Final  CULTURE, BLOOD (ROUTINE X 2) w Reflex to ID Panel     Status: None   Collection Time: 05/31/17  5:10 AM  Result Value Ref Range Status   Specimen Description BLOOD LEFT HAND  Final   Special Requests   Final    BOTTLES DRAWN AEROBIC AND ANAEROBIC Blood Culture adequate volume   Culture NO GROWTH 5 DAYS  Final   Report Status 06/05/2017 FINAL  Final  CULTURE, BLOOD (ROUTINE X 2) w Reflex to ID Panel     Status: None   Collection Time: 06/01/17  4:50 AM  Result Value Ref Range Status   Specimen Description BLOOD LEFT AC  Final   Special Requests   Final    BOTTLES DRAWN AEROBIC AND ANAEROBIC Blood Culture adequate volume   Culture NO GROWTH 5 DAYS  Final   Report Status 06/06/2017 FINAL  Final  CULTURE, BLOOD (ROUTINE X 2) w Reflex to ID Panel     Status: None   Collection Time: 06/01/17  4:57 AM  Result Value Ref Range Status   Specimen Description BLOOD LEFT HAND  Final   Special Requests   Final    BOTTLES DRAWN AEROBIC AND ANAEROBIC Blood Culture adequate volume   Culture NO GROWTH 5 DAYS  Final   Report Status 06/06/2017 FINAL  Final  CULTURE, BLOOD (ROUTINE X 2) w Reflex  to ID Panel      Status: None   Collection Time: 06/02/17  4:50 AM  Result Value Ref Range Status   Specimen Description BLOOD BLOOD LEFT HAND  Final   Special Requests   Final    BOTTLES DRAWN AEROBIC AND ANAEROBIC Blood Culture adequate volume   Culture NO GROWTH 5 DAYS  Final   Report Status 06/07/2017 FINAL  Final  CULTURE, BLOOD (ROUTINE X 2) w Reflex to ID Panel     Status: None   Collection Time: 06/02/17  4:57 AM  Result Value Ref Range Status   Specimen Description BLOOD RIGHT ANTECUBITAL  Final   Special Requests   Final    BOTTLES DRAWN AEROBIC AND ANAEROBIC Blood Culture adequate volume   Culture NO GROWTH 5 DAYS  Final   Report Status 06/07/2017 FINAL  Final  Blood Culture (routine x 2)     Status: None (Preliminary result)   Collection Time: 06/22/17  1:34 PM  Result Value Ref Range Status   Specimen Description BLOOD LEFT ANTECUBITAL  Final   Special Requests   Final    BOTTLES DRAWN AEROBIC AND ANAEROBIC Blood Culture adequate volume   Culture   Final    NO GROWTH < 24 HOURS Performed at [facility], [address] [place], [place] [address]    Report Status PENDING  Incomplete  Blood Culture (routine x 2)     Status: None (Preliminary result)   Collection Time: 06/22/17  1:34 PM  Result Value Ref Range Status   Specimen Description BLOOD LEFT ANTECUBITAL  Final   Special Requests   Final    BOTTLES DRAWN AEROBIC AND ANAEROBIC Blood Culture adequate volume   Culture   Final    NO GROWTH < 24 HOURS Performed at [facility], [address] [place], [place] [address]    Report Status PENDING  Incomplete  MRSA PCR Screening     Status: None   Collection Time: 06/22/17  6:00 PM  Result Value Ref Range Status   MRSA by PCR NEGATIVE NEGATIVE Final    Comment:        The GeneXpert MRSA Assay (FDA approved for NASAL specimens only), is one component of a comprehensive MRSA colonization surveillance program. It is not intended to diagnose MRSA infection  nor to guide or monitor treatment for MRSA infections. Performed at [facility], [address] [place], [place] [address]     Medical History: Past Medical History:  Diagnosis Date  . Anemia   . BPH without obstruction/lower urinary tract symptoms   . Cancer ([place])   . Cellulitis of scrotum   . Cyst of prostate 05/13/2015  . Diabetes ([place])   . Difficulty urinating   . Dyspnea   . Epididymoorchitis   . ETOH abuse   . Fracture of right tibia and fibula   . GERD (gastroesophageal reflux disease)   . GI bleed   . Malignant neoplasm of sigmoid colon ([place])   . Pancreatitis   . Trichomoniasis   . Wears dentures    Partial upper and lower.  reports poor fit.    Medications:  Medications Prior to Admission  Medication Sig Dispense Refill Last Dose  . aspirin EC 81 MG tablet Take 81 mg by mouth.   06/22/2017 at AM  . B-D UF III MINI PEN NEEDLES 31G X 5 MM MISC   1 UTD at UTD  . Blood Glucose Monitoring Suppl (GLUCOCOM BLOOD GLUCOSE MONITOR) DEVI  use as directed   UTD at UTD  . cephALEXin (KEFLEX) 500 MG capsule Take 1 capsule by mouth 2 (two) times daily.  0 06/22/2017 at AM  . diphenoxylate-atropine (LOMOTIL) 2.5-0.025 MG tablet take 1 tablet four times a day if needed   PRN at PRN  . doxycycline (VIBRA-TABS) 100 MG tablet Take 1 tablet by mouth 2 (two) times daily.  0 06/22/2017 at AM  . LANTUS SOLOSTAR 100 UNIT/ML Solostar Pen Inject 10 Units into the skin 2 (two) times daily. (Patient taking differently: Inject 10 Units into the skin daily at 10 pm. ) 15 mL 1 06/21/2017 at PM  . lidocaine-prilocaine (EMLA) cream Apply over porta cath 1-2 hours prior to chemo. 30 g 0 PRN at PRN  . lipase/protease/amylase (CREON) 36000 UNITS CPEP capsule Take 1 capsule (36,000 Units total) 3 (three) times daily before meals by mouth. 180 capsule 3 N/A at N/A  . midodrine (PROAMATINE) 5 MG tablet Take 1 tablet (5 mg total) by mouth 3 (three) times daily with meals. 90 tablet 0 06/22/2017  at AM  . ondansetron (ZOFRAN) 8 MG tablet Take 1 tablet (8 mg total) by mouth every 8 (eight) hours as needed for nausea or vomiting. 30 tablet 0 PRN at PRN  . Oxycodone HCl 10 MG TABS Take 1 tablet (10 mg total) every 6 (six) hours as needed by mouth. 30 tablet 0 PRN at PRN  . prochlorperazine (COMPAZINE) 10 MG tablet Take 1 tablet (10 mg total) by mouth every 6 (six) hours as needed for nausea or vomiting. 30 tablet 0 PRN at PRN  . RA ACETAMINOPHEN 325 MG tablet take 2 tablets by mouth every 6 hours  0 PRN at PRN  . traZODone (DESYREL) 50 MG tablet take 1 tablet by mouth at bedtime TO HELP WITH SLEEP-PRN   PRN at PRN  . Vitamins/Minerals TABS Take 1 tablet every morning by mouth.    06/22/2017 at AM  . NOVOLOG FLEXPEN 100 UNIT/ML FlexPen Inject 48 Units into the skin 3 (three) times daily with meals.   1 Not Taking at Unknown time  . Vancomycin (VANCOCIN) 750-5 MG/150ML-% SOLN Inject 150 mLs (750 mg total) into the vein every 12 (twelve) hours. (Patient not taking: Reported on 06/22/2017) 4000 mL  Not Taking at Unknown time   Scheduled:  . chlorhexidine gluconate (MEDLINE KIT)  15 mL Mouth Rinse BID  . feeding supplement (PRO-STAT SUGAR FREE 64)  30 mL Per Tube BID  . feeding supplement (VITAL HIGH PROTEIN)  1,000 mL Per Tube Q24H  . mouth rinse  15 mL Mouth Rinse QID   Assessment: Pharmacy consulted to dose and monitor Vancomycin and Zosyn in this 56 year old male being treated empirically for sepsis: poss. Aspiration PNA?  Goal of Therapy:  VT 15-20   Plan:  Patient received Vancomycin 1 g IV x 1. Will start Vancomycin 750 mg IV q18 hours starting @ midnight on 12/25.  Vanc Trough 12/27 0530.  Zosyn: Zosyn 3.375 g IV q8 hours.   Kimesha Claxton A 06/23/2017,2:30 PM

## 2017-06-23 NOTE — Consult Note (Signed)
[facility]  CONSULT NOTE    Date: 06/23/2017                  Patient Name:  [name]  MRN: [id number]  DOB: [birth year]  Age / Sex: 56 y.o., male         PCP: [name], MD                 Service Requesting Consult: Dr. Alva Garnet                 Reason for Consult: Metabolic Acidosis            History of Present Illness: [name] is a 56 y.o. black  male with colon cancer status post resection, diabetes mellitus type II, hypotension, history of GI bleed, anemia, GERD, alcohol abuse, BPH , who was admitted to [facility] on 06/22/2017 for Respiratory failure ([place]) [J96.90] Hypokalemia [E87.6] Hyperchloremia [E87.8] Shock ([place]) [R57.9] Altered mental state [R41.82] Hypoglycemia [E16.2] Endotracheally intubated [Z97.8] Sepsis, due to unspecified organism ([place]) [A41.9] Altered mental status, unspecified altered mental status type [R41.82] Anemia, unspecified type [D64.9]  History taken from chart and from family. Patient was in the ED yesterday where he was diagnosed with dehydration and treated with IV fluids.   When found by family, unknown period of unconsciousness, patient was found with a serum glucose of 27. Intubated, started on vasopressors. Admitted to ICU. Concern for aspiration pneumonia/sepsis.   Nephrology consulted for hyperchloremic metabolic acidosis (nonanion gap), and hypokalemia.    Medications: Outpatient medications: Medications Prior to Admission  Medication Sig Dispense Refill Last Dose  . aspirin EC 81 MG tablet Take 81 mg by mouth.   06/22/2017 at AM  . B-D UF III MINI PEN NEEDLES 31G X 5 MM MISC   1 UTD at UTD  . Blood Glucose Monitoring Suppl (GLUCOCOM BLOOD GLUCOSE MONITOR) DEVI use as directed   UTD at UTD  . cephALEXin (KEFLEX) 500 MG capsule Take 1 capsule by mouth 2 (two) times daily.  0 06/22/2017 at AM  . diphenoxylate-atropine (LOMOTIL) 2.5-0.025 MG tablet take 1 tablet four times a day if needed   PRN  at PRN  . doxycycline (VIBRA-TABS) 100 MG tablet Take 1 tablet by mouth 2 (two) times daily.  0 06/22/2017 at AM  . LANTUS SOLOSTAR 100 UNIT/ML Solostar Pen Inject 10 Units into the skin 2 (two) times daily. (Patient taking differently: Inject 10 Units into the skin daily at 10 pm. ) 15 mL 1 06/21/2017 at PM  . lidocaine-prilocaine (EMLA) cream Apply over porta cath 1-2 hours prior to chemo. 30 g 0 PRN at PRN  . lipase/protease/amylase (CREON) 36000 UNITS CPEP capsule Take 1 capsule (36,000 Units total) 3 (three) times daily before meals by mouth. 180 capsule 3 N/A at N/A  . midodrine (PROAMATINE) 5 MG tablet Take 1 tablet (5 mg total) by mouth 3 (three) times daily with meals. 90 tablet 0 06/22/2017 at AM  . ondansetron (ZOFRAN) 8 MG tablet Take 1 tablet (8 mg total) by mouth every 8 (eight) hours as needed for nausea or vomiting. 30 tablet 0 PRN at PRN  . Oxycodone HCl 10 MG TABS Take 1 tablet (10 mg total) every 6 (six) hours as needed by mouth. 30 tablet 0 PRN at PRN  . prochlorperazine (COMPAZINE) 10 MG tablet Take 1 tablet (10 mg total) by mouth every 6 (six) hours as needed for nausea or vomiting. 30 tablet 0  PRN at PRN  . RA ACETAMINOPHEN 325 MG tablet take 2 tablets by mouth every 6 hours  0 PRN at PRN  . traZODone (DESYREL) 50 MG tablet take 1 tablet by mouth at bedtime TO HELP WITH SLEEP-PRN   PRN at PRN  . Vitamins/Minerals TABS Take 1 tablet every morning by mouth.    06/22/2017 at AM  . NOVOLOG FLEXPEN 100 UNIT/ML FlexPen Inject 48 Units into the skin 3 (three) times daily with meals.   1 Not Taking at Unknown time  . Vancomycin (VANCOCIN) 750-5 MG/150ML-% SOLN Inject 150 mLs (750 mg total) into the vein every 12 (twelve) hours. (Patient not taking: Reported on 06/22/2017) 4000 mL  Not Taking at Unknown time    Current medications: Current Facility-Administered Medications  Medication Dose Route Frequency Provider Last Rate Last Dose  . chlorhexidine gluconate (MEDLINE KIT) (PERIDEX)  0.12 % solution 15 mL  15 mL Mouth Rinse BID Wilhelmina Mcardle, MD   15 mL at 06/23/17 0751  . feeding supplement (PRO-STAT SUGAR FREE 64) liquid 30 mL  30 mL Per Tube BID Wilhelmina Mcardle, MD   30 mL at 06/23/17 1035  . feeding supplement (VITAL HIGH PROTEIN) liquid 1,000 mL  1,000 mL Per Tube Q24H Wilhelmina Mcardle, MD   1,000 mL at 06/23/17 1034  . MEDLINE mouth rinse  15 mL Mouth Rinse QID Wilhelmina Mcardle, MD   15 mL at 06/23/17 0350  . piperacillin-tazobactam (ZOSYN) IVPB 3.375 g  3.375 g Intravenous Q8H Larene Beach, [facility]   Stopped at 06/23/17 0930  . sodium bicarbonate 150 mEq in dextrose 5 % 1,000 mL infusion   Intravenous Continuous Wilhelmina Mcardle, MD 150 mL/hr at 06/23/17 1036    . vancomycin (VANCOCIN) IVPB 750 mg/150 ml premix  750 mg Intravenous Q18H Larene Beach, [facility]   Stopped at 06/23/17 1610      Allergies: Allergies  Allergen Reactions  . Aspirin Itching and Other (See Comments)    Reaction: abdominal pain      Past Medical History: Past Medical History:  Diagnosis Date  . Anemia   . BPH without obstruction/lower urinary tract symptoms   . Cancer ([place])   . Cellulitis of scrotum   . Cyst of prostate 05/13/2015  . Diabetes ([place])   . Difficulty urinating   . Dyspnea   . Epididymoorchitis   . ETOH abuse   . Fracture of right tibia and fibula   . GERD (gastroesophageal reflux disease)   . GI bleed   . Malignant neoplasm of sigmoid colon ([place])   . Pancreatitis   . Trichomoniasis   . Wears dentures    Partial upper and lower.  reports poor fit.     Past Surgical History: Past Surgical History:  Procedure Laterality Date  . COLONOSCOPY WITH PROPOFOL N/A 12/02/2016   Procedure: COLONOSCOPY WITH PROPOFOL;  Surgeon: Lucilla Lame, MD;  Location: [facility] ENDOSCOPY;  Service: Endoscopy;  Laterality: N/A;  . ESOPHAGOGASTRODUODENOSCOPY (EGD) WITH PROPOFOL N/A 12/02/2016   Procedure: ESOPHAGOGASTRODUODENOSCOPY (EGD) WITH PROPOFOL;  Surgeon: Lucilla Lame, MD;   Location: [facility] ENDOSCOPY;  Service: Endoscopy;  Laterality: N/A;  . FLEXIBLE SIGMOIDOSCOPY N/A 02/05/2017   Procedure: FLEXIBLE SIGMOIDOSCOPY;  Surgeon: Lucilla Lame, MD;  Location: [facility];  Service: Gastroenterology;  Laterality: N/A;  Needs labs drawn. Needs to come in early.  No anesthesia  . FRACTURE SURGERY     TIBIA AND FIBULA  . FRACTURE SURGERY    . LAPAROSCOPIC PARTIAL  COLECTOMY  04/17/2017   [facility]  . PORT-A-CATH REMOVAL N/A 05/29/2017   Procedure: REMOVAL PORT-A-CATH;  Surgeon: Vickie Epley, MD;  Location: [facility] ORS;  Service: Vascular;  Laterality: N/A;  . PORTACATH PLACEMENT Right 05/18/2017   Procedure: INSERTION PORT-A-CATH;  Surgeon: Vickie Epley, MD;  Location: [facility] ORS;  Service: Vascular;  Laterality: Right;  . resection of pancreas    . SCROTAL EXPLORATION    . TEE WITHOUT CARDIOVERSION N/A 06/04/2017   Procedure: TRANSESOPHAGEAL ECHOCARDIOGRAM (TEE);  Surgeon: Corey Skains, MD;  Location: [facility] ORS;  Service: Cardiovascular;  Laterality: N/A;     Family History: Family History  Problem Relation Age of Onset  . Cancer - Colon Father   . Colon cancer Brother      Social History: Social History   Socioeconomic History  . Marital status: Single    Spouse name: Not on file  . Number of children: Not on file  . Years of education: Not on file  . Highest education level: Not on file  Social Needs  . Financial resource strain: Not on file  . Food insecurity - worry: Not on file  . Food insecurity - inability: Not on file  . Transportation needs - medical: Not on file  . Transportation needs - non-medical: Not on file  Occupational History  . Not on file  Tobacco Use  . Smoking status: Current Every Day Smoker    Packs/day: 0.25    Years: 41.00    Pack years: 10.25    Types: Cigarettes  . Smokeless tobacco: Never Used  . Tobacco comment: since age 56  Substance and Sexual Activity  . Alcohol use: Yes    Alcohol/week: 0.0 oz     Comment: occasionally - but says not drinkning currently  . Drug use: No  . Sexual activity: Yes    Partners: Male    Birth control/protection: None  Other Topics Concern  . Not on file  Social History Narrative  . Not on file     Review of Systems: Review of Systems  Unable to perform ROS: Critical illness    Vital Signs: Blood pressure 113/70, pulse 67, temperature 97.9 F (36.6 C), resp. rate 14, height '5\' 10"'$  (1.778 m), weight 51.3 kg (113 lb), SpO2 100 %.  Weight trends: Filed Weights   06/22/17 1336  Weight: 51.3 kg (113 lb)    Physical Exam: General: Critically ill  Head: ETT  Eyes: nonreactive pupils  Neck:  trachea midline  Lungs:  PRVC 24%  Heart: Regular rate and rhythm  Abdomen:  Soft, nontender  Extremities: No peripheral edema.  Neurologic: Nonfocal, moving all four extremities  Skin: No lesions         Lab results: Basic Metabolic Panel: Recent Labs  Lab 06/22/17 1815 06/22/17 2129 06/23/17 0133 06/23/17 0444 06/23/17 0729  NA 140 140 136 139  --   K 2.9* 3.2* 2.8* 3.3* 3.4*  CL 129* 127* 121* 122*  --   CO2 10* 12* 13* 15*  --   GLUCOSE 116* 84 212* 99  --   BUN '15 14 13 12  '$ --   CREATININE 0.94 0.97 1.03 0.95  --   CALCIUM 7.5* 7.6* 7.6* 7.8*  --   MG 1.3*  --   --  2.0  --     Liver Function Tests: Recent Labs  Lab 06/17/17 1307 06/22/17 1336 06/23/17 0444  AST 54* 30 35  ALT 47 32 28  ALKPHOS 149* 93  86  BILITOT 0.4 0.5 0.8  PROT 6.9 5.8* 5.4*  ALBUMIN 2.5* 2.1* 1.9*   Recent Labs  Lab 06/22/17 1336  LIPASE 12   No results for input(s): AMMONIA in the last 168 hours.  CBC: Recent Labs  Lab 06/17/17 1307 06/22/17 1336 06/22/17 1815 06/23/17 0133 06/23/17 0444  WBC 6.8 13.9* 12.9* 11.8* 11.4*  NEUTROABS 5.1 12.5* 11.5*  --   --   HGB 8.8* 7.3* 6.5* 7.6* 8.1*  HCT 27.6* 23.4* 21.1* 24.1* 25.1*  MCV 94.3 94.3 94.7 91.3 91.4  PLT 167 98* 82* 84* 86*    Cardiac Enzymes: Recent Labs  Lab 06/22/17 1336   TROPONINI <0.03    BNP: Invalid input(s): POCBNP  CBG: Recent Labs  Lab 06/23/17 0230 06/23/17 0338 06/23/17 0504 06/23/17 0636 06/23/17 0745  GLUCAP 94 81 94 107* 110*    Microbiology: Results for orders placed or performed during the hospital encounter of 06/22/17  Blood Culture (routine x 2)     Status: None (Preliminary result)   Collection Time: 06/22/17  1:34 PM  Result Value Ref Range Status   Specimen Description BLOOD LEFT ANTECUBITAL  Final   Special Requests   Final    BOTTLES DRAWN AEROBIC AND ANAEROBIC Blood Culture adequate volume   Culture   Final    NO GROWTH < 24 HOURS Performed at [facility], [address], [place], [place] [address]    Report Status PENDING  Incomplete  Blood Culture (routine x 2)     Status: None (Preliminary result)   Collection Time: 06/22/17  1:34 PM  Result Value Ref Range Status   Specimen Description BLOOD LEFT ANTECUBITAL  Final   Special Requests   Final    BOTTLES DRAWN AEROBIC AND ANAEROBIC Blood Culture adequate volume   Culture   Final    NO GROWTH < 24 HOURS Performed at [facility], [address], [place], [place] [address]    Report Status PENDING  Incomplete  MRSA PCR Screening     Status: None   Collection Time: 06/22/17  6:00 PM  Result Value Ref Range Status   MRSA by PCR NEGATIVE NEGATIVE Final    Comment:        The GeneXpert MRSA Assay (FDA approved for NASAL specimens only), is one component of a comprehensive MRSA colonization surveillance program. It is not intended to diagnose MRSA infection nor to guide or monitor treatment for MRSA infections. Performed at [facility], [address] [place],  [address]     Coagulation Studies: Recent Labs    06/22/17 1815  LABPROT 16.3*  INR 1.32    Urinalysis: Recent Labs    06/22/17 1334  COLORURINE YELLOW*  LABSPEC 1.014  PHURINE 5.0  GLUCOSEU 150*  HGBUR NEGATIVE  BILIRUBINUR NEGATIVE   KETONESUR NEGATIVE  PROTEINUR 30*  NITRITE NEGATIVE  LEUKOCYTESUR MODERATE*      Imaging: Dg Abd 1 View  Result Date: 06/23/2017 CLINICAL DATA:  Orogastric tube placement. EXAM: ABDOMEN - 1 VIEW COMPARISON:  CT of the abdomen and pelvis performed 06/22/2017 FINDINGS: The patient's enteric tube is noted ending overlying the body of the stomach. The stomach is decompressed. The visualized bowel gas pattern is unremarkable. Scattered air and stool filled loops of colon are seen; no abnormal dilatation of small bowel loops is seen to suggest small bowel obstruction. No free intra-abdominal air is identified, though evaluation for free air is limited on a single supine view. The visualized osseous  structures are within normal limits; the sacroiliac joints are unremarkable in appearance. The visualized lung bases are essentially clear. IMPRESSION: Enteric tube noted ending overlying the body of the stomach. Electronically Signed   By: Garald Balding M.D.   On: 06/23/2017 04:59   Ct Head Wo Contrast  Result Date: 06/22/2017 CLINICAL DATA:  Unresponsive upon arrival with hypertension. Narcan given prior to arrival. EXAM: CT HEAD WITHOUT CONTRAST TECHNIQUE: Contiguous axial images were obtained from the base of the skull through the vertex without intravenous contrast. COMPARISON:  06/09/2017, 05/28/2017 and 08/16/2016 FINDINGS: Brain: Ventricles, cisterns and other CSF spaces are within normal. There is no mass, mass effect, shift of midline structures or acute hemorrhage. Bilateral basal ganglia calcifications are present. No evidence of acute infarction. Vascular: No hyperdense vessel or unexpected calcification. Skull: Normal. Negative for fracture or focal lesion. Sinuses/Orbits: No acute finding. Other: None. IMPRESSION: Normal head CT. Electronically Signed   By: Marin Olp M.D.   On: 06/22/2017 15:39   Ct Abdomen Pelvis W Contrast  Result Date: 06/22/2017 CLINICAL DATA:  Pt in via [facility] from  home; pt unresponsive upon arrival with snoring respirations, hypotensive in the 85'I systolic. EXAM: CT ABDOMEN AND PELVIS WITH CONTRAST TECHNIQUE: Multidetector CT imaging of the abdomen and pelvis was performed using the standard protocol following bolus administration of intravenous contrast. CONTRAST:  46m ISOVUE-300 IOPAMIDOL (ISOVUE-300) INJECTION 61% COMPARISON:  01/23/2017 FINDINGS: Lower chest: Hazy and reticular type opacity noted in the left lower lobe of the lung base. Small areas of hazy opacity noted at the base of the left upper lobe lingula. Minor dependent subsegmental atelectasis in the right lower lobe. Heart normal in size. Hepatobiliary: 12 mm low-density lesion in segment 7, stable consistent with a hemangioma. No other liver lesions. Gallbladder is unremarkable. No bile duct dilation. Pancreas: Extensive calcifications and pancreatic atrophy consistent with chronic pancreatitis, stable. No acute inflammation. No mass. Spleen: Normal in size without focal abnormality. Adrenals/Urinary Tract: No adrenal masses. Kidneys normal size, orientation and position. No renal masses or stones. Ureters normal course and caliber. The small amount of nondependent air is noted in the bladder. No bladder mass or wall thickening. Stomach/Bowel: Since prior CT, colon surgery has been performed. There is anastomosis staple at at the rectosigmoid junction. There is no bowel dilation to suggest obstruction. There is no colonic wall thickening or adjacent inflammation. A few diverticula are noted along the right colon. No evidence of diverticulitis. Small bowel is normal in caliber. Anastomotic staples are noted in the left upper quadrant, within the jejunum, unchanged from the prior study. No small bowel inflammation. Stomach is unremarkable. Vascular/Lymphatic: Aortic atherosclerosis. No enlarged abdominal or pelvic lymph nodes. Reproductive: Prostate mildly enlarged heterogeneous. It is stable from the prior  study. Other: No ascites.  No abdominal wall hernia. Musculoskeletal: No osteoblastic or osteolytic lesions. No fracture or acute finding. IMPRESSION: 1. Small patchy areas of opacity noted in the left lower lobe and base of the left upper lobe lingula. Although this may be atelectasis, pneumonia should be considered likely in the proper clinical setting. 2. No acute findings within the abdomen or pelvis. 3. No evidence of bowel obstruction or inflammation. 4. Status post sigmoid colon resection since the prior exam. No evidence of an operative complication. 5. Aortic atherosclerosis. Electronically Signed   By: DLajean ManesM.D.   On: 06/22/2017 15:46   Dg Chest Port 1 View  Result Date: 06/23/2017 CLINICAL DATA:  Acute onset of respiratory failure. EXAM: PORTABLE  CHEST 1 VIEW COMPARISON:  Chest radiograph performed 06/22/2017 FINDINGS: The patient's endotracheal tube is seen ending 3-4 cm above the carina. A left IJ line is noted ending about the mid SVC. An enteric tube is noted ending overlying the body of the stomach. The lungs are well-aerated and clear. There is no evidence of focal opacification, pleural effusion or pneumothorax. The cardiomediastinal silhouette is within normal limits. No acute osseous abnormalities are seen. Chronic left-sided rib deformities are noted. IMPRESSION: 1. Endotracheal tube seen ending 3-4 cm above the carina. 2. No acute cardiopulmonary process seen. Electronically Signed   By: Garald Balding M.D.   On: 06/23/2017 05:00   Dg Chest Port 1 View  Result Date: 06/22/2017 CLINICAL DATA:  Central line placement EXAM: PORTABLE CHEST 1 VIEW COMPARISON:  06/22/2017, 06/09/2017 FINDINGS: Endotracheal tube tip is about 2.6 cm superior to the carina. Left-sided central venous catheter tip projects over the SVC. No left pneumothorax is seen. Increasing opacity at the left base. No pleural effusion. Normal heart size. Old bilateral rib fractures. IMPRESSION: 1. Left-sided central  venous catheter tip overlies the SVC. Negative for pneumothorax 2. Increasing atelectasis or infiltrate at the left lung base. Electronically Signed   By: Donavan Foil M.D.   On: 06/22/2017 20:51   Dg Chest Port 1 View  Result Date: 06/22/2017 CLINICAL DATA:  Patient unresponsive with hypotension. 2 mg narcan given. EXAM: PORTABLE CHEST 1 VIEW COMPARISON:  06/09/2017 and 08/16/2016 FINDINGS: Endotracheal tube has tip 5.5 cm above the carina. Lungs are adequately inflated without consolidation, effusion or pneumothorax. Cardiomediastinal silhouette is within normal. Old left posterior rib fractures. IMPRESSION: No active disease. Endotracheal tube with tip 5.5 cm above the carina. Electronically Signed   By: Marin Olp M.D.   On: 06/22/2017 14:55      Assessment & Plan: [name] is a 56 y.o. black  male with colon cancer status post resection, diabetes mellitus type II, hypotension, history of GI bleed, anemia, GERD, alcohol abuse, BPH , who was admitted to [facility] on 06/22/2017 for Respiratory failure ([place]) [J96.90] Hypokalemia [E87.6] Hyperchloremia [E87.8] Shock ([place]) [R57.9] Altered mental state [R41.82] Hypoglycemia [E16.2] Endotracheally intubated [Z97.8] Sepsis, due to unspecified organism ([place]) [A41.9] Altered mental status, unspecified altered mental status type [R41.82] Anemia, unspecified type [D64.9]  1. Non anion gap Metabolic Acidosis:  2. Hypokalemia 3. Anemia and thrombocytopenia  Impression Seems to be since 11/18 - after his colon resection. 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 More concerning would be ingestion of  methanol or ethylene glycol  Lactic acid is low. Not in diabetic ketoacidosis. No diarrhea. Patient without ostomy.  - Continue bicarb gtt - Check serum volatile screen - Check serum osm.   Overall prognosis seems grim. Discussed case with Dr. Alva Garnet.    LOS: [place],  Lyan Moyano 12/25/201810:45 AM

## 2017-06-23 NOTE — Consult Note (Signed)
Reason for Consult: AMS Referring Physician: Dr. Darvin Neighbours   CC: AMS  HPI: [name] is an 56 y.o. male with a known history of insulin-dependent diabetes mellitus, recently not requiring that much insulin, colon cancer status post colon resection not following any oncologist, history of recurrent infections as reported by the family came into the ED yesterday for being hypotensive. Patient was given IV fluids and he was discharged home. Pt was acting normal until 12 am to 1am today. Yesterday morning family members tried to arouse him as he was sleeping for long time, called EMS as patient was unarousable. EMS found him with pinpoint pupils and blood glucose was at 27. Patient was given an ampule of D50 and sugar went up to 197. 2 MG of Narcan was givenwith no response. Patient was brought into the emergency department. Patient was found to be hypotensive, hypothermic and hypoxemic. Patient was intubated. CT head is negative. Pt had progressive weight loss and malnutrition.    Past Medical History:  Diagnosis Date  . Anemia   . BPH without obstruction/lower urinary tract symptoms   . Cancer ([place])   . Cellulitis of scrotum   . Cyst of prostate 05/13/2015  . Diabetes ([place])   . Difficulty urinating   . Dyspnea   . Epididymoorchitis   . ETOH abuse   . Fracture of right tibia and fibula   . GERD (gastroesophageal reflux disease)   . GI bleed   . Malignant neoplasm of sigmoid colon ([place])   . Pancreatitis   . Trichomoniasis   . Wears dentures    Partial upper and lower.  reports poor fit.    Past Surgical History:  Procedure Laterality Date  . COLONOSCOPY WITH PROPOFOL N/A 12/02/2016   Procedure: COLONOSCOPY WITH PROPOFOL;  Surgeon: Lucilla Lame, MD;  Location: [facility] ENDOSCOPY;  Service: Endoscopy;  Laterality: N/A;  . ESOPHAGOGASTRODUODENOSCOPY (EGD) WITH PROPOFOL N/A 12/02/2016   Procedure: ESOPHAGOGASTRODUODENOSCOPY (EGD) WITH PROPOFOL;  Surgeon: Lucilla Lame, MD;  Location: [facility]  ENDOSCOPY;  Service: Endoscopy;  Laterality: N/A;  . FLEXIBLE SIGMOIDOSCOPY N/A 02/05/2017   Procedure: FLEXIBLE SIGMOIDOSCOPY;  Surgeon: Lucilla Lame, MD;  Location: [facility];  Service: Gastroenterology;  Laterality: N/A;  Needs labs drawn. Needs to come in early.  No anesthesia  . FRACTURE SURGERY     TIBIA AND FIBULA  . FRACTURE SURGERY    . LAPAROSCOPIC PARTIAL COLECTOMY  04/17/2017   [facility]  . PORT-A-CATH REMOVAL N/A 05/29/2017   Procedure: REMOVAL PORT-A-CATH;  Surgeon: Vickie Epley, MD;  Location: [facility] ORS;  Service: Vascular;  Laterality: N/A;  . PORTACATH PLACEMENT Right 05/18/2017   Procedure: INSERTION PORT-A-CATH;  Surgeon: Vickie Epley, MD;  Location: [facility] ORS;  Service: Vascular;  Laterality: Right;  . resection of pancreas    . SCROTAL EXPLORATION    . TEE WITHOUT CARDIOVERSION N/A 06/04/2017   Procedure: TRANSESOPHAGEAL ECHOCARDIOGRAM (TEE);  Surgeon: Corey Skains, MD;  Location: [facility] ORS;  Service: Cardiovascular;  Laterality: N/A;    Family History  Problem Relation Age of Onset  . Cancer - Colon Father   . Colon cancer Brother     Social History:  reports that he has been smoking cigarettes.  He has a 10.25 pack-year smoking history. he has never used smokeless tobacco. He reports that he drinks alcohol. He reports that he does not use drugs.  Allergies  Allergen Reactions  . Aspirin Itching and Other (See Comments)    Reaction: abdominal pain  Medications: I have reviewed the patient's current medications.  ROS: Unable to obtain   Physical Examination: Blood pressure 100/63, pulse 76, temperature (!) 100.4 F (38 C), resp. rate 20, height 5\' 10"  (1.778 m), weight 113 lb (51.3 kg), SpO2 98 %.    Neurological Examination   Mental Status: Sedated not following commands.  Cranial Nerves: II: rowing eye movements  III,IV, VI: ptosis not present, extra-ocular motions intact bilaterally V,VII: smile symmetric IX,X: gag reflex  present XI: unable to test  XII: not tested  Motor: No movement to painful stimuli Tone and bulk:normal tone throughout; no atrophy noted    Laboratory Studies:   Basic Metabolic Panel: Recent Labs  Lab 06/22/17 1815 06/22/17 2129 06/23/17 0133 06/23/17 0444 06/23/17 0729 06/23/17 1212  NA 140 140 136 139  --  139  K 2.9* 3.2* 2.8* 3.3* 3.4* 3.1*  CL 129* 127* 121* 122*  --  118*  CO2 10* 12* 13* 15*  --  18*  GLUCOSE 116* 84 212* 99  --  171*  BUN 15 14 13 12   --  11  CREATININE 0.94 0.97 1.03 0.95  --  1.01  CALCIUM 7.5* 7.6* 7.6* 7.8*  --  7.8*  MG 1.3*  --   --  2.0  --   --     Liver Function Tests: Recent Labs  Lab 06/17/17 1307 06/22/17 1336 06/23/17 0444  AST 54* 30 35  ALT 47 32 28  ALKPHOS 149* 93 86  BILITOT 0.4 0.5 0.8  PROT 6.9 5.8* 5.4*  ALBUMIN 2.5* 2.1* 1.9*   Recent Labs  Lab 06/22/17 1336  LIPASE 12   No results for input(s): AMMONIA in the last 168 hours.  CBC: Recent Labs  Lab 06/17/17 1307 06/22/17 1336 06/22/17 1815 06/23/17 0133 06/23/17 0444  WBC 6.8 13.9* 12.9* 11.8* 11.4*  NEUTROABS 5.1 12.5* 11.5*  --   --   HGB 8.8* 7.3* 6.5* 7.6* 8.1*  HCT 27.6* 23.4* 21.1* 24.1* 25.1*  MCV 94.3 94.3 94.7 91.3 91.4  PLT 167 98* 82* 84* 86*    Cardiac Enzymes: Recent Labs  Lab 06/22/17 1336  TROPONINI <0.03    BNP: Invalid input(s): POCBNP  CBG: Recent Labs  Lab 06/23/17 0338 06/23/17 0504 06/23/17 0636 06/23/17 0745 06/23/17 1118  GLUCAP 81 94 107* 110* 148*    Microbiology: Results for orders placed or performed during the hospital encounter of 06/22/17  Blood Culture (routine x 2)     Status: None (Preliminary result)   Collection Time: 06/22/17  1:34 PM  Result Value Ref Range Status   Specimen Description BLOOD LEFT ANTECUBITAL  Final   Special Requests   Final    BOTTLES DRAWN AEROBIC AND ANAEROBIC Blood Culture adequate volume   Culture   Final    NO GROWTH < 24 HOURS Performed at [facility],  [address] [place], [place] [address]    Report Status PENDING  Incomplete  Blood Culture (routine x 2)     Status: None (Preliminary result)   Collection Time: 06/22/17  1:34 PM  Result Value Ref Range Status   Specimen Description BLOOD LEFT ANTECUBITAL  Final   Special Requests   Final    BOTTLES DRAWN AEROBIC AND ANAEROBIC Blood Culture adequate volume   Culture   Final    NO GROWTH < 24 HOURS Performed at [facility], [address] [place], [place] [address]    Report Status PENDING  Incomplete  MRSA PCR  Screening     Status: None   Collection Time: 06/22/17  6:00 PM  Result Value Ref Range Status   MRSA by PCR NEGATIVE NEGATIVE Final    Comment:        The GeneXpert MRSA Assay (FDA approved for NASAL specimens only), is one component of a comprehensive MRSA colonization surveillance program. It is not intended to diagnose MRSA infection nor to guide or monitor treatment for MRSA infections. Performed at [facility], [address] [place], [place] [address]     Coagulation Studies: Recent Labs    06/22/17 1815  LABPROT 16.3*  INR 1.32    Urinalysis:  Recent Labs  Lab 06/22/17 1334  COLORURINE YELLOW*  LABSPEC 1.014  PHURINE 5.0  GLUCOSEU 150*  HGBUR NEGATIVE  BILIRUBINUR NEGATIVE  KETONESUR NEGATIVE  PROTEINUR 30*  NITRITE NEGATIVE  LEUKOCYTESUR MODERATE*    Lipid Panel:     Component Value Date/Time   CHOL 197 09/11/2012 0518   TRIG 119 09/11/2012 0518   HDL 24 (L) 09/11/2012 0518   VLDL 24 09/11/2012 0518   LDLCALC 149 (H) 09/11/2012 0518    HgbA1C:  Lab Results  Component Value Date   HGBA1C 11.8 (H) 05/14/2015    Urine Drug Screen:      Component Value Date/Time   LABOPIA NONE DETECTED 06/22/2017 1334   COCAINSCRNUR NONE DETECTED 06/22/2017 1334   LABBENZ NONE DETECTED 06/22/2017 1334   AMPHETMU NONE DETECTED 06/22/2017 1334   THCU NONE DETECTED 06/22/2017 1334   LABBARB NONE DETECTED 06/22/2017  1334    Alcohol Level:  Recent Labs  Lab 06/22/17 1336  ETH <10      Imaging: Dg Abd 1 View  Result Date: 06/23/2017 CLINICAL DATA:  Orogastric tube placement. EXAM: ABDOMEN - 1 VIEW COMPARISON:  CT of the abdomen and pelvis performed 06/22/2017 FINDINGS: The patient's enteric tube is noted ending overlying the body of the stomach. The stomach is decompressed. The visualized bowel gas pattern is unremarkable. Scattered air and stool filled loops of colon are seen; no abnormal dilatation of small bowel loops is seen to suggest small bowel obstruction. No free intra-abdominal air is identified, though evaluation for free air is limited on a single supine view. The visualized osseous structures are within normal limits; the sacroiliac joints are unremarkable in appearance. The visualized lung bases are essentially clear. IMPRESSION: Enteric tube noted ending overlying the body of the stomach. Electronically Signed   By: Garald Balding M.D.   On: 06/23/2017 04:59   Ct Head Wo Contrast  Result Date: 06/22/2017 CLINICAL DATA:  Unresponsive upon arrival with hypertension. Narcan given prior to arrival. EXAM: CT HEAD WITHOUT CONTRAST TECHNIQUE: Contiguous axial images were obtained from the base of the skull through the vertex without intravenous contrast. COMPARISON:  06/09/2017, 05/28/2017 and 08/16/2016 FINDINGS: Brain: Ventricles, cisterns and other CSF spaces are within normal. There is no mass, mass effect, shift of midline structures or acute hemorrhage. Bilateral basal ganglia calcifications are present. No evidence of acute infarction. Vascular: No hyperdense vessel or unexpected calcification. Skull: Normal. Negative for fracture or focal lesion. Sinuses/Orbits: No acute finding. Other: None. IMPRESSION: Normal head CT. Electronically Signed   By: Marin Olp M.D.   On: 06/22/2017 15:39   Ct Abdomen Pelvis W Contrast  Result Date: 06/22/2017 CLINICAL DATA:  Pt in via [facility] from home; pt  unresponsive upon arrival with snoring respirations, hypotensive in the 62'Z systolic. EXAM: CT ABDOMEN AND PELVIS WITH CONTRAST TECHNIQUE: Multidetector CT  imaging of the abdomen and pelvis was performed using the standard protocol following bolus administration of intravenous contrast. CONTRAST:  84mL ISOVUE-300 IOPAMIDOL (ISOVUE-300) INJECTION 61% COMPARISON:  01/23/2017 FINDINGS: Lower chest: Hazy and reticular type opacity noted in the left lower lobe of the lung base. Small areas of hazy opacity noted at the base of the left upper lobe lingula. Minor dependent subsegmental atelectasis in the right lower lobe. Heart normal in size. Hepatobiliary: 12 mm low-density lesion in segment 7, stable consistent with a hemangioma. No other liver lesions. Gallbladder is unremarkable. No bile duct dilation. Pancreas: Extensive calcifications and pancreatic atrophy consistent with chronic pancreatitis, stable. No acute inflammation. No mass. Spleen: Normal in size without focal abnormality. Adrenals/Urinary Tract: No adrenal masses. Kidneys normal size, orientation and position. No renal masses or stones. Ureters normal course and caliber. The small amount of nondependent air is noted in the bladder. No bladder mass or wall thickening. Stomach/Bowel: Since prior CT, colon surgery has been performed. There is anastomosis staple at at the rectosigmoid junction. There is no bowel dilation to suggest obstruction. There is no colonic wall thickening or adjacent inflammation. A few diverticula are noted along the right colon. No evidence of diverticulitis. Small bowel is normal in caliber. Anastomotic staples are noted in the left upper quadrant, within the jejunum, unchanged from the prior study. No small bowel inflammation. Stomach is unremarkable. Vascular/Lymphatic: Aortic atherosclerosis. No enlarged abdominal or pelvic lymph nodes. Reproductive: Prostate mildly enlarged heterogeneous. It is stable from the prior study.  Other: No ascites.  No abdominal wall hernia. Musculoskeletal: No osteoblastic or osteolytic lesions. No fracture or acute finding. IMPRESSION: 1. Small patchy areas of opacity noted in the left lower lobe and base of the left upper lobe lingula. Although this may be atelectasis, pneumonia should be considered likely in the proper clinical setting. 2. No acute findings within the abdomen or pelvis. 3. No evidence of bowel obstruction or inflammation. 4. Status post sigmoid colon resection since the prior exam. No evidence of an operative complication. 5. Aortic atherosclerosis. Electronically Signed   By: Lajean Manes M.D.   On: 06/22/2017 15:46   Dg Chest Port 1 View  Result Date: 06/23/2017 CLINICAL DATA:  Acute onset of respiratory failure. EXAM: PORTABLE CHEST 1 VIEW COMPARISON:  Chest radiograph performed 06/22/2017 FINDINGS: The patient's endotracheal tube is seen ending 3-4 cm above the carina. A left IJ line is noted ending about the mid SVC. An enteric tube is noted ending overlying the body of the stomach. The lungs are well-aerated and clear. There is no evidence of focal opacification, pleural effusion or pneumothorax. The cardiomediastinal silhouette is within normal limits. No acute osseous abnormalities are seen. Chronic left-sided rib deformities are noted. IMPRESSION: 1. Endotracheal tube seen ending 3-4 cm above the carina. 2. No acute cardiopulmonary process seen. Electronically Signed   By: Garald Balding M.D.   On: 06/23/2017 05:00   Dg Chest Port 1 View  Result Date: 06/22/2017 CLINICAL DATA:  Central line placement EXAM: PORTABLE CHEST 1 VIEW COMPARISON:  06/22/2017, 06/09/2017 FINDINGS: Endotracheal tube tip is about 2.6 cm superior to the carina. Left-sided central venous catheter tip projects over the SVC. No left pneumothorax is seen. Increasing opacity at the left base. No pleural effusion. Normal heart size. Old bilateral rib fractures. IMPRESSION: 1. Left-sided central venous  catheter tip overlies the SVC. Negative for pneumothorax 2. Increasing atelectasis or infiltrate at the left lung base. Electronically Signed   By: Madie Reno.D.  On: 06/22/2017 20:51   Dg Chest Port 1 View  Result Date: 06/22/2017 CLINICAL DATA:  Patient unresponsive with hypotension. 2 mg narcan given. EXAM: PORTABLE CHEST 1 VIEW COMPARISON:  06/09/2017 and 08/16/2016 FINDINGS: Endotracheal tube has tip 5.5 cm above the carina. Lungs are adequately inflated without consolidation, effusion or pneumothorax. Cardiomediastinal silhouette is within normal. Old left posterior rib fractures. IMPRESSION: No active disease. Endotracheal tube with tip 5.5 cm above the carina. Electronically Signed   By: Marin Olp M.D.   On: 06/22/2017 14:55     Assessment/Plan:  56 y.o. male with a known history of insulin-dependent diabetes mellitus, recently not requiring that much insulin, colon cancer status post colon resection not following any oncologist, history of recurrent infections as reported by the family came into the ED yesterday for being hypotensive. Patient was given IV fluids and he was discharged home. Pt was acting normal until 12 am to 1am today. Yesterday morning family members tried to arouse him as he was sleeping for long time, called EMS as patient was unarousable. EMS found him with pinpoint pupils and blood glucose was at 27. Patient was given an ampule of D50 and sugar went up to 197. 2 MG of Narcan was givenwith no response. Patient was brought into the emergency department. Patient was found to be hypotensive, hypothermic and hypoxemic. Patient was intubated. CT head is negative. Pt had progressive weight loss and malnutrition.    - Rowing eye movements and not following commands - Worried about subcortical ischemia in subcortical areas such as caudate and thalami - Will start on Keppra as worried about possible seizures - MRI brain ordered routine when possible to look for  ischemia/anoxia - Guarded prognosis will follow.  - Pt is currently off sedation.   06/23/2017, 1:44 PM

## 2017-06-23 NOTE — Progress Notes (Signed)
Patient having nonpurposeful movements of head, neck and mouth. Multiple attempts to obtain purposeful response from patient unsuccessful; pain or verbal. Blood pressure WNL pressor stopped. Warming measures stopped and restarted within 1 hour d/t patient temperature dropping.

## 2017-06-23 NOTE — Progress Notes (Signed)
BS 305 via fingerstick.   K 2.8.   Notified Dr Detarding via Margaree Mackintosh.

## 2017-06-23 NOTE — Progress Notes (Signed)
eLink Physician-Brief Progress Note Patient Name: [name] DOB: 1961-05-02 MRN: [id number]   Date of Service  06/23/2017  HPI/Events of Note  Hyperglycemia Hypokalemia  eICU Interventions  Placed on moderate SSI q4 hours Potassium replaced     Intervention Category Intermediate Interventions: Hyperglycemia - evaluation and treatment;Electrolyte abnormality - evaluation and management  DETERDING,ELIZABETH 06/23/2017, 7:56 PM

## 2017-06-24 ENCOUNTER — Inpatient Hospital Stay: Payer: Medicaid Other

## 2017-06-24 ENCOUNTER — Inpatient Hospital Stay (HOSPITAL_COMMUNITY): Payer: Medicaid Other

## 2017-06-24 DIAGNOSIS — E878 Other disorders of electrolyte and fluid balance, not elsewhere classified: Secondary | ICD-10-CM

## 2017-06-24 DIAGNOSIS — Z978 Presence of other specified devices: Secondary | ICD-10-CM

## 2017-06-24 DIAGNOSIS — R402431 Glasgow coma scale score 3-8, in the field [EMT or ambulance]: Secondary | ICD-10-CM

## 2017-06-24 DIAGNOSIS — R4182 Altered mental status, unspecified: Secondary | ICD-10-CM

## 2017-06-24 DIAGNOSIS — E43 Unspecified severe protein-calorie malnutrition: Secondary | ICD-10-CM

## 2017-06-24 LAB — CBC
HEMATOCRIT: 24.9 % — AB (ref 40.0–52.0)
HEMOGLOBIN: 8.1 g/dL — AB (ref 13.0–18.0)
MCH: 29.1 pg (ref 26.0–34.0)
MCHC: 32.4 g/dL (ref 32.0–36.0)
MCV: 90 fL (ref 80.0–100.0)
Platelets: 71 10*3/uL — ABNORMAL LOW (ref 150–440)
RBC: 2.77 MIL/uL — AB (ref 4.40–5.90)
RDW: 15.4 % — ABNORMAL HIGH (ref 11.5–14.5)
WBC: 16 10*3/uL — AB (ref 3.8–10.6)

## 2017-06-24 LAB — PROCALCITONIN: Procalcitonin: 0.24 ng/mL

## 2017-06-24 LAB — PHOSPHORUS
PHOSPHORUS: 1.7 mg/dL — AB (ref 2.5–4.6)
PHOSPHORUS: 2.1 mg/dL — AB (ref 2.5–4.6)

## 2017-06-24 LAB — GASTROINTESTINAL PANEL BY PCR, STOOL (REPLACES STOOL CULTURE)

## 2017-06-24 LAB — BASIC METABOLIC PANEL
ANION GAP: 3 — AB (ref 5–15)
ANION GAP: 4 — AB (ref 5–15)
BUN: 10 mg/dL (ref 6–20)
BUN: 10 mg/dL (ref 6–20)
CALCIUM: 7.7 mg/dL — AB (ref 8.9–10.3)
CHLORIDE: 111 mmol/L (ref 101–111)
CO2: 26 mmol/L (ref 22–32)
CO2: 27 mmol/L (ref 22–32)
CREATININE: 0.79 mg/dL (ref 0.61–1.24)
CREATININE: 0.73 mg/dL (ref 0.61–1.24)
Calcium: 7.7 mg/dL — ABNORMAL LOW (ref 8.9–10.3)
Chloride: 109 mmol/L (ref 101–111)
GFR calc non Af Amer: >60 mL/min (ref >60)
GFR calc non Af Amer: >60 mL/min (ref >60)
Glucose, Bld: 173 mg/dL — ABNORMAL HIGH (ref 65–99)
Glucose, Bld: 109 mg/dL — ABNORMAL HIGH (ref 65–99)
POTASSIUM: 4.8 mmol/L (ref 3.5–5.1)
Potassium: 3.5 mmol/L (ref 3.5–5.1)
SODIUM: 140 mmol/L (ref 135–145)
SODIUM: 140 mmol/L (ref 135–145)

## 2017-06-24 LAB — GLUCOSE, CAPILLARY
GLUCOSE-CAPILLARY: 114 mg/dL — AB (ref 65–99)
GLUCOSE-CAPILLARY: 125 mg/dL — AB (ref 65–99)
GLUCOSE-CAPILLARY: 42 mg/dL — AB (ref 65–99)
GLUCOSE-CAPILLARY: 78 mg/dL (ref 65–99)
GLUCOSE-CAPILLARY: 87 mg/dL (ref 65–99)
Glucose-Capillary: 190 mg/dL — ABNORMAL HIGH (ref 65–99)
Glucose-Capillary: 31 mg/dL — CL (ref 65–99)
Glucose-Capillary: 161 mg/dL — ABNORMAL HIGH (ref 65–99)
Glucose-Capillary: 161 mg/dL — ABNORMAL HIGH (ref 65–99)
Glucose-Capillary: 156 mg/dL — ABNORMAL HIGH (ref 65–99)

## 2017-06-24 LAB — C DIFFICILE QUICK SCREEN W PCR REFLEX
C DIFFICLE (CDIFF) ANTIGEN: NEGATIVE
C Diff interpretation: NOT DETECTED
C Diff toxin: NEGATIVE

## 2017-06-24 LAB — URINE CULTURE

## 2017-06-24 LAB — VOLATILES,BLD-ACETONE,ETHANOL,ISOPROP,METHANOL
ACETONE, BLOOD: NEGATIVE % (ref 0.000–0.010)
Ethanol, blood: NEGATIVE % (ref 0.000–0.010)
Isopropanol, blood: NEGATIVE % (ref 0.000–0.010)
METHANOL, BLOOD: NEGATIVE % (ref 0.000–0.010)

## 2017-06-24 LAB — MAGNESIUM: Magnesium: 1.8 mg/dL (ref 1.7–2.4)

## 2017-06-24 MED ORDER — PROPOFOL 1000 MG/100ML IV EMUL
INTRAVENOUS | Status: AC
  Administered 2017-06-24: 15 ug/kg/min via INTRAVENOUS
  Filled 2017-06-24: qty 100

## 2017-06-24 MED ORDER — FENTANYL CITRATE (PF) 100 MCG/2ML IJ SOLN: 100.0000 ug | INTRAMUSCULAR | Status: DC | PRN

## 2017-06-24 MED ORDER — MIDAZOLAM HCL 2 MG/2ML IJ SOLN
2.0000 mg | INTRAMUSCULAR | Status: DC | PRN
  Administered 2017-06-24 – 2017-06-25 (×3): 2 mg via INTRAVENOUS
  Filled 2017-06-24 (×3): qty 2

## 2017-06-24 MED ORDER — PROPOFOL 1000 MG/100ML IV EMUL
5.0000 ug/kg/min | INTRAVENOUS | Status: DC
  Administered 2017-06-24: 15 ug/kg/min via INTRAVENOUS
  Administered 2017-06-25: 35 ug/kg/min via INTRAVENOUS
  Filled 2017-06-24 (×2): qty 100

## 2017-06-24 MED ORDER — MAGNESIUM SULFATE IN D5W 1-5 GM/100ML-% IV SOLN
1.0000 g | Freq: Once | INTRAVENOUS | Status: AC
  Administered 2017-06-24: 1 g via INTRAVENOUS
  Filled 2017-06-24: qty 100

## 2017-06-24 MED ORDER — MIDAZOLAM HCL 2 MG/2ML IJ SOLN
INTRAMUSCULAR | Status: AC
  Administered 2017-06-24: 2 mg via INTRAVENOUS
  Filled 2017-06-24: qty 2

## 2017-06-24 MED ORDER — VITAL HIGH PROTEIN PO LIQD
1000.0000 mL | ORAL | Status: DC
Start: 2017-06-24 — End: 2017-06-25
  Administered 2017-06-24: 1000 mL

## 2017-06-24 MED ORDER — PANTOPRAZOLE SODIUM 40 MG IV SOLR
40.0000 mg | INTRAVENOUS | Status: DC
  Administered 2017-06-24: 40 mg via INTRAVENOUS
  Filled 2017-06-24: qty 40

## 2017-06-24 MED ORDER — FENTANYL CITRATE (PF) 100 MCG/2ML IJ SOLN
50.0000 ug | Freq: Once | INTRAMUSCULAR | Status: AC
  Administered 2017-06-24: 50 ug via INTRAVENOUS

## 2017-06-24 MED ORDER — FENTANYL BOLUS VIA INFUSION
50.0000 ug | INTRAVENOUS | Status: DC | PRN
  Filled 2017-06-24: qty 50

## 2017-06-24 MED ORDER — FENTANYL 2500MCG IN NS 250ML (10MCG/ML) PREMIX INFUSION
25.0000 ug/h | INTRAVENOUS | Status: DC
  Administered 2017-06-24: 50 ug/h via INTRAVENOUS
  Filled 2017-06-24: qty 250

## 2017-06-24 MED ORDER — SODIUM BICARBONATE 8.4 % IV SOLN
INTRAVENOUS | Status: DC
  Administered 2017-06-24: 08:00:00 via INTRAVENOUS
  Filled 2017-06-24 (×2): qty 150

## 2017-06-24 MED ORDER — FENTANYL CITRATE (PF) 100 MCG/2ML IJ SOLN
INTRAMUSCULAR | Status: AC
  Administered 2017-06-24: 50 ug via INTRAVENOUS
  Filled 2017-06-24: qty 2

## 2017-06-24 MED ORDER — DEXTROSE 50 % IV SOLN
INTRAVENOUS | Status: AC
  Administered 2017-06-24: 50 mL
  Filled 2017-06-24: qty 50

## 2017-06-24 MED ORDER — FAMOTIDINE 20 MG PO TABS
20.0000 mg | ORAL_TABLET | Freq: Two times a day (BID) | ORAL | Status: DC
  Administered 2017-06-24 (×2): 20 mg
  Filled 2017-06-24 (×2): qty 1

## 2017-06-24 MED ORDER — INSULIN ASPART 100 UNIT/ML ~~LOC~~ SOLN
0.0000 [IU] | SUBCUTANEOUS | Status: DC
  Administered 2017-06-24: 2 [IU] via SUBCUTANEOUS
  Administered 2017-06-24: 1 [IU] via SUBCUTANEOUS
  Administered 2017-06-25: 3 [IU] via SUBCUTANEOUS
  Administered 2017-06-26: 2 [IU] via SUBCUTANEOUS
  Administered 2017-06-26: 1 [IU] via SUBCUTANEOUS
  Administered 2017-06-26 – 2017-06-27 (×2): 2 [IU] via SUBCUTANEOUS
  Administered 2017-06-27: 1 [IU] via SUBCUTANEOUS
  Administered 2017-06-28 (×4): 2 [IU] via SUBCUTANEOUS
  Administered 2017-06-29 (×2): 3 [IU] via SUBCUTANEOUS
  Administered 2017-06-29: 1 [IU] via SUBCUTANEOUS
  Administered 2017-06-30: 2 [IU] via SUBCUTANEOUS
  Administered 2017-06-30: 1 [IU] via SUBCUTANEOUS
  Administered 2017-06-30: 2 [IU] via SUBCUTANEOUS
  Administered 2017-06-30: 1 [IU] via SUBCUTANEOUS
  Administered 2017-06-30: 2 [IU] via SUBCUTANEOUS
  Administered 2017-07-01: 3 [IU] via SUBCUTANEOUS
  Administered 2017-07-01: 2 [IU] via SUBCUTANEOUS
  Administered 2017-07-01: 3 [IU] via SUBCUTANEOUS
  Administered 2017-07-01 (×2): 2 [IU] via SUBCUTANEOUS
  Administered 2017-07-02: 1 [IU] via SUBCUTANEOUS
  Administered 2017-07-02: 3 [IU] via SUBCUTANEOUS
  Administered 2017-07-02: 5 [IU] via SUBCUTANEOUS
  Filled 2017-06-24 (×27): qty 1

## 2017-06-24 MED ORDER — GADOBENATE DIMEGLUMINE 529 MG/ML IV SOLN
10.0000 mL | Freq: Once | INTRAVENOUS | Status: AC | PRN
  Administered 2017-06-24: 10 mL via INTRAVENOUS

## 2017-06-24 MED ORDER — POTASSIUM PHOSPHATES 15 MMOLE/5ML IV SOLN
10.0000 mmol | Freq: Once | INTRAVENOUS | Status: AC
  Administered 2017-06-24: 10 mmol via INTRAVENOUS
  Filled 2017-06-24: qty 3.33

## 2017-06-24 MED ORDER — MIDAZOLAM HCL 2 MG/2ML IJ SOLN
2.0000 mg | Freq: Once | INTRAMUSCULAR | Status: AC
  Administered 2017-06-24: 2 mg via INTRAVENOUS

## 2017-06-24 MED ORDER — ADULT MULTIVITAMIN LIQUID CH
15.0000 mL | Freq: Every day | ORAL | Status: DC
  Administered 2017-06-24 – 2017-07-02 (×9): 15 mL
  Filled 2017-06-24 (×11): qty 15

## 2017-06-24 MED ORDER — FENTANYL CITRATE (PF) 100 MCG/2ML IJ SOLN
50.0000 ug | INTRAMUSCULAR | Status: DC | PRN
  Administered 2017-06-24: 100 ug via INTRAVENOUS
  Administered 2017-06-24: 50 ug via INTRAVENOUS
  Administered 2017-06-24 – 2017-06-25 (×3): 100 ug via INTRAVENOUS
  Administered 2017-06-25: 50 ug via INTRAVENOUS
  Filled 2017-06-24 (×6): qty 2

## 2017-06-24 MED ORDER — PANCRELIPASE (LIP-PROT-AMYL) 12000-38000 UNITS PO CPEP
24000.0000 [IU] | ORAL_CAPSULE | Freq: Four times a day (QID) | ORAL | Status: DC
  Administered 2017-06-24 – 2017-06-25 (×4): 24000 [IU] via ORAL
  Filled 2017-06-24 (×4): qty 2

## 2017-06-24 NOTE — Progress Notes (Signed)
[facility], [place] 06/24/17  Subjective:   Patient remains critically ill, intubated Urine output 1515 cc Bicarb corrected to 26 today Serum creatinine remains in the normal range  Objective:  Vital signs in last 24 hours:  Temp:  [95.5 F (35.3 C)-100.4 F (38 C)] 97.3 F (36.3 C) (12/26 0800) Pulse Rate:  [57-82] 61 (12/26 0800) Resp:  [10-22] 14 (12/26 0800) BP: (78-131)/(51-82) 80/60 (12/26 0800) SpO2:  [92 %-100 %] 100 % (12/26 0800) FiO2 (%):  [24 %-25 %] 25 % (12/26 0700) Weight:  [53.8 kg (118 lb 9.7 oz)] 53.8 kg (118 lb 9.7 oz) (12/25 1400)  Weight change: 2.544 kg (5 lb 9.7 oz) Filed Weights   06/22/17 1336 06/23/17 1400  Weight: 51.3 kg (113 lb) 53.8 kg (118 lb 9.7 oz)    Intake/Output:    Intake/Output Summary (Last 24 hours) at 06/24/2017 0855 Last data filed at 06/24/2017 0600 Gross per 24 hour  Intake 3274.5 ml  Output 1365 ml  Net 1909.5 ml     Physical Exam: General:  Critically ill, intubated  HEENT  eyes closed, ET tube in place  Neck  no masses, no distended neck veins  Pulm/lungs  ventilator assisted  CVS/Heart  regular  Abdomen:   Soft, nondistended  Extremities:  Some dependent edema  Neurologic:  Eyes closed, not respond to voice or tactile stimulus  Skin:  No acute rashes          Basic Metabolic Panel:  Recent Labs  Lab 06/22/17 1815  06/23/17 0133 06/23/17 0444 06/23/17 0729 06/23/17 1212 06/23/17 1804 06/24/17 0505  NA 140   < > 136 139  --  139 139 140  K 2.9*   < > 2.8* 3.3* 3.4* 3.1* 2.8* 4.8  CL 129*   < > 121* 122*  --  118* 115* 111  CO2 10*   < > 13* 15*  --  18* 21* 26  GLUCOSE 116*   < > 212* 99  --  171* 243* 173*  BUN 15   < > 13 12  --  '11 12 10  '$ CREATININE 0.94   < > 1.03 0.95  --  1.01 0.93 0.79  CALCIUM 7.5*   < > 7.6* 7.8*  --  7.8* 7.2* 7.7*  MG 1.3*  --   --  2.0  --   --   --  1.8  PHOS  --   --   --   --   --   --   --  1.7*   < > = values in this interval not  displayed.     CBC: Recent Labs  Lab 06/17/17 1307 06/22/17 1336 06/22/17 1815 06/23/17 0133 06/23/17 0444 06/24/17 0505  WBC 6.8 13.9* 12.9* 11.8* 11.4* 16.0*  NEUTROABS 5.1 12.5* 11.5*  --   --   --   HGB 8.8* 7.3* 6.5* 7.6* 8.1* 8.1*  HCT 27.6* 23.4* 21.1* 24.1* 25.1* 24.9*  MCV 94.3 94.3 94.7 91.3 91.4 90.0  PLT 167 98* 82* 84* 86* 71*      Lab Results  Component Value Date   HEPBSAG Negative 01/23/2017   HEPBIGM Negative 01/23/2017      Microbiology:  Recent Results (from the past 240 hour(s))  Blood Culture (routine x 2)     Status: None (Preliminary result)   Collection Time: 06/22/17  1:34 PM  Result Value Ref Range Status   Specimen Description BLOOD LEFT ANTECUBITAL  Final   Special  Requests   Final    BOTTLES DRAWN AEROBIC AND ANAEROBIC Blood Culture adequate volume   Culture   Final    NO GROWTH 2 DAYS Performed at [facility], [address] [place], [place] [address]    Report Status PENDING  Incomplete  Blood Culture (routine x 2)     Status: None (Preliminary result)   Collection Time: 06/22/17  1:34 PM  Result Value Ref Range Status   Specimen Description BLOOD LEFT ANTECUBITAL  Final   Special Requests   Final    BOTTLES DRAWN AEROBIC AND ANAEROBIC Blood Culture adequate volume   Culture   Final    NO GROWTH 2 DAYS Performed at [facility], [address] [place], [place] [address]    Report Status PENDING  Incomplete  Urine culture     Status: Abnormal   Collection Time: 06/22/17  1:34 PM  Result Value Ref Range Status   Specimen Description   Final    URINE, RANDOM Performed at [facility], [address] [place], [place] [address]    Special Requests   Final    NONE Performed at [facility], [address] [place], [place] [address]    Culture 20,000 COLONIES/mL YEAST (A)  Final   Report Status 06/24/2017 FINAL  Final  MRSA PCR Screening     Status: None   Collection Time:  06/22/17  6:00 PM  Result Value Ref Range Status   MRSA by PCR NEGATIVE NEGATIVE Final    Comment:        The GeneXpert MRSA Assay (FDA approved for NASAL specimens only), is one component of a comprehensive MRSA colonization surveillance program. It is not intended to diagnose MRSA infection nor to guide or monitor treatment for MRSA infections. Performed at [facility], [address] [place], [place] [address]   C difficile quick scan w PCR reflex     Status: None   Collection Time: 06/24/17  5:05 AM  Result Value Ref Range Status   C Diff antigen NEGATIVE NEGATIVE Final   C Diff toxin NEGATIVE NEGATIVE Final   C Diff interpretation No C. difficile detected.  Final    Comment: Performed at [facility], [address] [place], [place] [address]  Gastrointestinal Panel by PCR , Stool     Status: None   Collection Time: 06/24/17  5:05 AM  Result Value Ref Range Status   Campylobacter species NOT DETECTED NOT DETECTED Final   Plesimonas shigelloides NOT DETECTED NOT DETECTED Final   Salmonella species NOT DETECTED NOT DETECTED Final   Yersinia enterocolitica NOT DETECTED NOT DETECTED Final   Vibrio species NOT DETECTED NOT DETECTED Final   Vibrio cholerae NOT DETECTED NOT DETECTED Final   Enteroaggregative E coli (EAEC) NOT DETECTED NOT DETECTED Final   Enteropathogenic E coli (EPEC) NOT DETECTED NOT DETECTED Final   Enterotoxigenic E coli (ETEC) NOT DETECTED NOT DETECTED Final   Shiga like toxin producing E coli (STEC) NOT DETECTED NOT DETECTED Final   Shigella/Enteroinvasive E coli (EIEC) NOT DETECTED NOT DETECTED Final   Cryptosporidium NOT DETECTED NOT DETECTED Final   Cyclospora cayetanensis NOT DETECTED NOT DETECTED Final   Entamoeba histolytica NOT DETECTED NOT DETECTED Final   Giardia lamblia NOT DETECTED NOT DETECTED Final   Adenovirus F40/41 NOT DETECTED NOT DETECTED Final   Astrovirus NOT DETECTED NOT DETECTED Final   Norovirus GI/GII NOT  DETECTED NOT DETECTED Final   Rotavirus A NOT DETECTED NOT  DETECTED Final   Sapovirus (I, II, IV, and V) NOT DETECTED NOT DETECTED Final    Comment: Performed at [facility], [address] [place], [place] [address]    Coagulation Studies: Recent Labs    06/22/17 1815  LABPROT 16.3*  INR 1.32    Urinalysis: Recent Labs    06/22/17 1334  COLORURINE YELLOW*  LABSPEC 1.014  PHURINE 5.0  GLUCOSEU 150*  HGBUR NEGATIVE  BILIRUBINUR NEGATIVE  KETONESUR NEGATIVE  PROTEINUR 30*  NITRITE NEGATIVE  LEUKOCYTESUR MODERATE*      Imaging: Dg Abd 1 View  Result Date: 06/23/2017 CLINICAL DATA:  Orogastric tube placement. EXAM: ABDOMEN - 1 VIEW COMPARISON:  CT of the abdomen and pelvis performed 06/22/2017 FINDINGS: The patient's enteric tube is noted ending overlying the body of the stomach. The stomach is decompressed. The visualized bowel gas pattern is unremarkable. Scattered air and stool filled loops of colon are seen; no abnormal dilatation of small bowel loops is seen to suggest small bowel obstruction. No free intra-abdominal air is identified, though evaluation for free air is limited on a single supine view. The visualized osseous structures are within normal limits; the sacroiliac joints are unremarkable in appearance. The visualized lung bases are essentially clear. IMPRESSION: Enteric tube noted ending overlying the body of the stomach. Electronically Signed   By: Garald Balding M.D.   On: 06/23/2017 04:59   Ct Head Wo Contrast  Result Date: 06/22/2017 CLINICAL DATA:  Unresponsive upon arrival with hypertension. Narcan given prior to arrival. EXAM: CT HEAD WITHOUT CONTRAST TECHNIQUE: Contiguous axial images were obtained from the base of the skull through the vertex without intravenous contrast. COMPARISON:  06/09/2017, 05/28/2017 and 08/16/2016 FINDINGS: Brain: Ventricles, cisterns and other CSF spaces are within normal. There is no mass, mass effect, shift of  midline structures or acute hemorrhage. Bilateral basal ganglia calcifications are present. No evidence of acute infarction. Vascular: No hyperdense vessel or unexpected calcification. Skull: Normal. Negative for fracture or focal lesion. Sinuses/Orbits: No acute finding. Other: None. IMPRESSION: Normal head CT. Electronically Signed   By: Marin Olp M.D.   On: 06/22/2017 15:39   Ct Abdomen Pelvis W Contrast  Result Date: 06/22/2017 CLINICAL DATA:  Pt in via [facility] from home; pt unresponsive upon arrival with snoring respirations, hypotensive in the 54'O systolic. EXAM: CT ABDOMEN AND PELVIS WITH CONTRAST TECHNIQUE: Multidetector CT imaging of the abdomen and pelvis was performed using the standard protocol following bolus administration of intravenous contrast. CONTRAST:  70m ISOVUE-300 IOPAMIDOL (ISOVUE-300) INJECTION 61% COMPARISON:  01/23/2017 FINDINGS: Lower chest: Hazy and reticular type opacity noted in the left lower lobe of the lung base. Small areas of hazy opacity noted at the base of the left upper lobe lingula. Minor dependent subsegmental atelectasis in the right lower lobe. Heart normal in size. Hepatobiliary: 12 mm low-density lesion in segment 7, stable consistent with a hemangioma. No other liver lesions. Gallbladder is unremarkable. No bile duct dilation. Pancreas: Extensive calcifications and pancreatic atrophy consistent with chronic pancreatitis, stable. No acute inflammation. No mass. Spleen: Normal in size without focal abnormality. Adrenals/Urinary Tract: No adrenal masses. Kidneys normal size, orientation and position. No renal masses or stones. Ureters normal course and caliber. The small amount of nondependent air is noted in the bladder. No bladder mass or wall thickening. Stomach/Bowel: Since prior CT, colon surgery has been performed. There is anastomosis staple at at the rectosigmoid junction. There is no bowel dilation to suggest obstruction. There is no colonic wall  thickening or adjacent inflammation. A few diverticula are noted along the right colon. No evidence of diverticulitis. Small bowel is normal in caliber. Anastomotic staples are noted in the left upper quadrant, within the jejunum, unchanged from the prior study. No small bowel inflammation. Stomach is unremarkable. Vascular/Lymphatic: Aortic atherosclerosis. No enlarged abdominal or pelvic lymph nodes. Reproductive: Prostate mildly enlarged heterogeneous. It is stable from the prior study. Other: No ascites.  No abdominal wall hernia. Musculoskeletal: No osteoblastic or osteolytic lesions. No fracture or acute finding. IMPRESSION: 1. Small patchy areas of opacity noted in the left lower lobe and base of the left upper lobe lingula. Although this may be atelectasis, pneumonia should be considered likely in the proper clinical setting. 2. No acute findings within the abdomen or pelvis. 3. No evidence of bowel obstruction or inflammation. 4. Status post sigmoid colon resection since the prior exam. No evidence of an operative complication. 5. Aortic atherosclerosis. Electronically Signed   By: Lajean Manes M.D.   On: 06/22/2017 15:46   Dg Chest Port 1 View  Result Date: 06/23/2017 CLINICAL DATA:  Acute onset of respiratory failure. EXAM: PORTABLE CHEST 1 VIEW COMPARISON:  Chest radiograph performed 06/22/2017 FINDINGS: The patient's endotracheal tube is seen ending 3-4 cm above the carina. A left IJ line is noted ending about the mid SVC. An enteric tube is noted ending overlying the body of the stomach. The lungs are well-aerated and clear. There is no evidence of focal opacification, pleural effusion or pneumothorax. The cardiomediastinal silhouette is within normal limits. No acute osseous abnormalities are seen. Chronic left-sided rib deformities are noted. IMPRESSION: 1. Endotracheal tube seen ending 3-4 cm above the carina. 2. No acute cardiopulmonary process seen. Electronically Signed   By: Garald Balding  M.D.   On: 06/23/2017 05:00   Dg Chest Port 1 View  Result Date: 06/22/2017 CLINICAL DATA:  Central line placement EXAM: PORTABLE CHEST 1 VIEW COMPARISON:  06/22/2017, 06/09/2017 FINDINGS: Endotracheal tube tip is about 2.6 cm superior to the carina. Left-sided central venous catheter tip projects over the SVC. No left pneumothorax is seen. Increasing opacity at the left base. No pleural effusion. Normal heart size. Old bilateral rib fractures. IMPRESSION: 1. Left-sided central venous catheter tip overlies the SVC. Negative for pneumothorax 2. Increasing atelectasis or infiltrate at the left lung base. Electronically Signed   By: Donavan Foil M.D.   On: 06/22/2017 20:51   Dg Chest Port 1 View  Result Date: 06/22/2017 CLINICAL DATA:  Patient unresponsive with hypotension. 2 mg narcan given. EXAM: PORTABLE CHEST 1 VIEW COMPARISON:  06/09/2017 and 08/16/2016 FINDINGS: Endotracheal tube has tip 5.5 cm above the carina. Lungs are adequately inflated without consolidation, effusion or pneumothorax. Cardiomediastinal silhouette is within normal. Old left posterior rib fractures. IMPRESSION: No active disease. Endotracheal tube with tip 5.5 cm above the carina. Electronically Signed   By: Marin Olp M.D.   On: 06/22/2017 14:55     Medications:   . levETIRAcetam Stopped (06/24/17 0247)   . chlorhexidine gluconate (MEDLINE KIT)  15 mL Mouth Rinse BID  . famotidine  20 mg Per Tube BID  . feeding supplement (PRO-STAT SUGAR FREE 64)  30 mL Per Tube BID  . feeding supplement (VITAL HIGH PROTEIN)  1,000 mL Per Tube Q24H  . insulin aspart  0-9 Units Subcutaneous Q4H  . mouth rinse  15 mL Mouth Rinse 10 times per day     Assessment/ Plan:  56 y.o. African-American male   with colon cancer  status post resection, diabetes mellitus type II, hypotension, history of GI bleed, anemia, GERD, alcohol abuse, BPH , who was admitted to [facility] on 06/22/2017 for Respiratory failure ([place]) [J96.90] Hypokalemia  [E87.6] Hyperchloremia [E87.8] Shock ([place]) [R57.9] Altered mental state [R41.82] Hypoglycemia [E16.2] Endotracheally intubated [Z97.8] Sepsis, due to unspecified organism ([place]) [A41.9] Altered mental status, unspecified altered mental status type [R41.82] Anemia, unspecified type [D64.9]  1. Non anion gap Metabolic Acidosis:  2. Hypokalemia 3. Anemia and thrombocytopenia  Patient likely has non-gap metabolic acidosis from GI losses.  Patient has loose stools and is requiring rectal tube. Continue to replace bicarbonate and potassium as necessary    LOS: 2 Margaretta Chittum Candiss Norse 12/26/20188:55 AM  [place], [place]

## 2017-06-24 NOTE — Progress Notes (Signed)
Subjective: Patient taken off Fentanyl about 2 hours prior to evaluation.  Has had no further eye deviation or mouth movements since the load of Keppra.    Objective: Current vital signs: BP 104/68   Pulse 68   Temp (!) 97.3 F (36.3 C)   Resp 12   Ht _0  (1.778 m)   Wt 53.8 kg (118 lb 9.7 oz)   SpO2 100%   BMI 17.02 kg/m  Vital signs in last 24 hours: Temp:  [95.5 F (35.3 C)-100 F (37.8 C)] 97.3 F (36.3 C) (12/26 1100) Pulse Rate:  [57-82] 68 (12/26 1100) Resp:  [7-21] 12 (12/26 1100) BP: (78-131)/(51-82) 104/68 (12/26 1100) SpO2:  [92 %-100 %] 100 % (12/26 1100) FiO2 (%):  [24 %-25 %] 24 % (12/26 0735) Weight:  [53.8 kg (118 lb 9.7 oz)] 53.8 kg (118 lb 9.7 oz) (12/25 1400)  Intake/Output from previous day: 12/25 0701 - 12/26 0700 In: 3274.5 [I.V.:2266.2; NG/GT:497.3; IV Piggyback:511] Out: 3710 [Urine:1515; Stool:200] Intake/Output this shift: No intake/output data recorded. Nutritional status: No diet orders on file  Neurologic Exam: Mental Status: Patient does not respond to verbal stimuli.  Opens eyes to light sternal rub.  Does not follow commands.  No verbalizations noted.  Cranial Nerves: II: patient does not respond confrontation bilaterally, pupils right 3 mm, left 3 mm,and sluggishly reactive bilaterally III,IV,VI: doll's response absent bilaterally.  V,VII: corneal reflex absent bilaterally  VIII: patient does not respond to verbal stimuli IX,X: gag reflex reduced, XI: trapezius strength unable to test bilaterally XII: tongue strength unable to test Motor: Extremities flaccid throughout with some minimal movement of the hands bilaterally, spontaneous.  No movement of the lower extremities.  Actively resisting my attempts at head movement.   Sensory: Does not respond to noxious stimuli in any extremity. Deep Tendon Reflexes:  Absent throughout. Plantars: Mute bilaterally Cerebellar: Unable to perform   Lab Results: Basic Metabolic  Panel: Recent Labs  Lab 06/22/17 1815  06/23/17 0133 06/23/17 0444 06/23/17 0729 06/23/17 1212 06/23/17 1804 06/24/17 0505  NA 140   < > 136 139  --  139 139 140  K 2.9*   < > 2.8* 3.3* 3.4* 3.1* 2.8* 4.8  CL 129*   < > 121* 122*  --  118* 115* 111  CO2 10*   < > 13* 15*  --  18* 21* 26  GLUCOSE 116*   < > 212* 99  --  171* 243* 173*  BUN 15   < > 13 12  --  _1 CREATININE 0.94   < > 1.03 0.95  --  1.01 0.93 0.79  CALCIUM 7.5*   < > 7.6* 7.8*  --  7.8* 7.2* 7.7*  MG 1.3*  --   --  2.0  --   --   --  1.8  PHOS  --   --   --   --   --   --   --  1.7*   < > = values in this interval not displayed.    Liver Function Tests: Recent Labs  Lab 06/17/17 1307 06/22/17 1336 06/23/17 0444  AST 54* 30 35  ALT 47 32 28  ALKPHOS 149* 93 86  BILITOT 0.4 0.5 0.8  PROT 6.9 5.8* 5.4*  ALBUMIN 2.5* 2.1* 1.9*   Recent Labs  Lab 06/22/17 1336  LIPASE 12   No results for input(s): AMMONIA in the last 168 hours.  CBC: Recent Labs  Lab 06/17/17 1307  06/22/17 1336 06/22/17 1815 06/23/17 0133 06/23/17 0444 06/24/17 0505  WBC 6.8 13.9* 12.9* 11.8* 11.4* 16.0*  NEUTROABS 5.1 12.5* 11.5*  --   --   --   HGB 8.8* 7.3* 6.5* 7.6* 8.1* 8.1*  HCT 27.6* 23.4* 21.1* 24.1* 25.1* 24.9*  MCV 94.3 94.3 94.7 91.3 91.4 90.0  PLT 167 98* 82* 84* 86* 71*    Cardiac Enzymes: Recent Labs  Lab 06/22/17 1336  TROPONINI <0.03    Lipid Panel: No results for input(s): CHOL, TRIG, HDL, CHOLHDL, VLDL, LDLCALC in the last 168 hours.  CBG: Recent Labs  Lab 06/24/17 0222 06/24/17 0421 06/24/17 0527 06/24/17 0831 06/24/17 1134  GLUCAP 190* 161* 161* 156* 125*    Microbiology: Results for orders placed or performed during the hospital encounter of 06/22/17  Blood Culture (routine x 2)     Status: None (Preliminary result)   Collection Time: 06/22/17  1:34 PM  Result Value Ref Range Status   Specimen Description BLOOD LEFT ANTECUBITAL  Final   Special Requests   Final    BOTTLES  DRAWN AEROBIC AND ANAEROBIC Blood Culture adequate volume   Culture   Final    NO GROWTH 2 DAYS Performed at [facility], [address], [place], [place] [address]    Report Status PENDING  Incomplete  Blood Culture (routine x 2)     Status: None (Preliminary result)   Collection Time: 06/22/17  1:34 PM  Result Value Ref Range Status   Specimen Description BLOOD LEFT ANTECUBITAL  Final   Special Requests   Final    BOTTLES DRAWN AEROBIC AND ANAEROBIC Blood Culture adequate volume   Culture   Final    NO GROWTH 2 DAYS Performed at [facility], [address], [place], [place] [address]    Report Status PENDING  Incomplete  Urine culture     Status: Abnormal   Collection Time: 06/22/17  1:34 PM  Result Value Ref Range Status   Specimen Description   Final    URINE, RANDOM Performed at [facility], [address] [place], [place] [address]    Special Requests   Final    NONE Performed at [facility], [address] [place], [place] [address]    Culture 20,000 COLONIES/mL YEAST (A)  Final   Report Status 06/24/2017 FINAL  Final  MRSA PCR Screening     Status: None   Collection Time: 06/22/17  6:00 PM  Result Value Ref Range Status   MRSA by PCR NEGATIVE NEGATIVE Final    Comment:        The GeneXpert MRSA Assay (FDA approved for NASAL specimens only), is one component of a comprehensive MRSA colonization surveillance program. It is not intended to diagnose MRSA infection nor to guide or monitor treatment for MRSA infections. Performed at [facility], [address] [place], [place] [address]   C difficile quick scan w PCR reflex     Status: None   Collection Time: 06/24/17  5:05 AM  Result Value Ref Range Status   C Diff antigen NEGATIVE NEGATIVE Final   C Diff toxin NEGATIVE NEGATIVE Final   C Diff interpretation No C. difficile detected.  Final    Comment: Performed at [facility], [address] [place],  [address]  Gastrointestinal Panel by PCR , Stool     Status: None   Collection Time: 06/24/17  5:05 AM  Result Value Ref Range Status   Campylobacter  species NOT DETECTED NOT DETECTED Final   Plesimonas shigelloides NOT DETECTED NOT DETECTED Final   Salmonella species NOT DETECTED NOT DETECTED Final   Yersinia enterocolitica NOT DETECTED NOT DETECTED Final   Vibrio species NOT DETECTED NOT DETECTED Final   Vibrio cholerae NOT DETECTED NOT DETECTED Final   Enteroaggregative E coli (EAEC) NOT DETECTED NOT DETECTED Final   Enteropathogenic E coli (EPEC) NOT DETECTED NOT DETECTED Final   Enterotoxigenic E coli (ETEC) NOT DETECTED NOT DETECTED Final   Shiga like toxin producing E coli (STEC) NOT DETECTED NOT DETECTED Final   Shigella/Enteroinvasive E coli (EIEC) NOT DETECTED NOT DETECTED Final   Cryptosporidium NOT DETECTED NOT DETECTED Final   Cyclospora cayetanensis NOT DETECTED NOT DETECTED Final   Entamoeba histolytica NOT DETECTED NOT DETECTED Final   Giardia lamblia NOT DETECTED NOT DETECTED Final   Adenovirus F40/41 NOT DETECTED NOT DETECTED Final   Astrovirus NOT DETECTED NOT DETECTED Final   Norovirus GI/GII NOT DETECTED NOT DETECTED Final   Rotavirus A NOT DETECTED NOT DETECTED Final   Sapovirus (I, II, IV, and V) NOT DETECTED NOT DETECTED Final    Comment: Performed at [facility], [address] [place], [place] [address]    Coagulation Studies: Recent Labs    06/22/17 1815  LABPROT 16.3*  INR 1.32    Imaging: Dg Abd 1 View  Result Date: 06/23/2017 CLINICAL DATA:  Orogastric tube placement. EXAM: ABDOMEN - 1 VIEW COMPARISON:  CT of the abdomen and pelvis performed 06/22/2017 FINDINGS: The patient's enteric tube is noted ending overlying the body of the stomach. The stomach is decompressed. The visualized bowel gas pattern is unremarkable. Scattered air and stool filled loops of colon are seen; no abnormal dilatation of small bowel loops is  seen to suggest small bowel obstruction. No free intra-abdominal air is identified, though evaluation for free air is limited on a single supine view. The visualized osseous structures are within normal limits; the sacroiliac joints are unremarkable in appearance. The visualized lung bases are essentially clear. IMPRESSION: Enteric tube noted ending overlying the body of the stomach. Electronically Signed   By: Garald Balding M.D.   On: 06/23/2017 04:59   Ct Head Wo Contrast  Result Date: 06/22/2017 CLINICAL DATA:  Unresponsive upon arrival with hypertension. Narcan given prior to arrival. EXAM: CT HEAD WITHOUT CONTRAST TECHNIQUE: Contiguous axial images were obtained from the base of the skull through the vertex without intravenous contrast. COMPARISON:  06/09/2017, 05/28/2017 and 08/16/2016 FINDINGS: Brain: Ventricles, cisterns and other CSF spaces are within normal. There is no mass, mass effect, shift of midline structures or acute hemorrhage. Bilateral basal ganglia calcifications are present. No evidence of acute infarction. Vascular: No hyperdense vessel or unexpected calcification. Skull: Normal. Negative for fracture or focal lesion. Sinuses/Orbits: No acute finding. Other: None. IMPRESSION: Normal head CT. Electronically Signed   By: Marin Olp M.D.   On: 06/22/2017 15:39   Ct Abdomen Pelvis W Contrast  Result Date: 06/22/2017 CLINICAL DATA:  Pt in via [facility] from home; pt unresponsive upon arrival with snoring respirations, hypotensive in the 98'J systolic. EXAM: CT ABDOMEN AND PELVIS WITH CONTRAST TECHNIQUE: Multidetector CT imaging of the abdomen and pelvis was performed using the standard protocol following bolus administration of intravenous contrast. CONTRAST:  75m ISOVUE-300 IOPAMIDOL (ISOVUE-300) INJECTION 61% COMPARISON:  01/23/2017 FINDINGS: Lower chest: Hazy and reticular type opacity noted in the left lower lobe of the lung base. Small areas of hazy opacity noted at the base of the  left upper lobe lingula. Minor dependent subsegmental atelectasis in the right lower lobe. Heart normal in size. Hepatobiliary: 12 mm low-density lesion in segment 7, stable consistent with a hemangioma. No other liver lesions. Gallbladder is unremarkable. No bile duct dilation. Pancreas: Extensive calcifications and pancreatic atrophy consistent with chronic pancreatitis, stable. No acute inflammation. No mass. Spleen: Normal in size without focal abnormality. Adrenals/Urinary Tract: No adrenal masses. Kidneys normal size, orientation and position. No renal masses or stones. Ureters normal course and caliber. The small amount of nondependent air is noted in the bladder. No bladder mass or wall thickening. Stomach/Bowel: Since prior CT, colon surgery has been performed. There is anastomosis staple at at the rectosigmoid junction. There is no bowel dilation to suggest obstruction. There is no colonic wall thickening or adjacent inflammation. A few diverticula are noted along the right colon. No evidence of diverticulitis. Small bowel is normal in caliber. Anastomotic staples are noted in the left upper quadrant, within the jejunum, unchanged from the prior study. No small bowel inflammation. Stomach is unremarkable. Vascular/Lymphatic: Aortic atherosclerosis. No enlarged abdominal or pelvic lymph nodes. Reproductive: Prostate mildly enlarged heterogeneous. It is stable from the prior study. Other: No ascites.  No abdominal wall hernia. Musculoskeletal: No osteoblastic or osteolytic lesions. No fracture or acute finding. IMPRESSION: 1. Small patchy areas of opacity noted in the left lower lobe and base of the left upper lobe lingula. Although this may be atelectasis, pneumonia should be considered likely in the proper clinical setting. 2. No acute findings within the abdomen or pelvis. 3. No evidence of bowel obstruction or inflammation. 4. Status post sigmoid colon resection since the prior exam. No evidence of an  operative complication. 5. Aortic atherosclerosis. Electronically Signed   By: Lajean Manes M.D.   On: 06/22/2017 15:46   Dg Chest Port 1 View  Result Date: 06/23/2017 CLINICAL DATA:  Acute onset of respiratory failure. EXAM: PORTABLE CHEST 1 VIEW COMPARISON:  Chest radiograph performed 06/22/2017 FINDINGS: The patient's endotracheal tube is seen ending 3-4 cm above the carina. A left IJ line is noted ending about the mid SVC. An enteric tube is noted ending overlying the body of the stomach. The lungs are well-aerated and clear. There is no evidence of focal opacification, pleural effusion or pneumothorax. The cardiomediastinal silhouette is within normal limits. No acute osseous abnormalities are seen. Chronic left-sided rib deformities are noted. IMPRESSION: 1. Endotracheal tube seen ending 3-4 cm above the carina. 2. No acute cardiopulmonary process seen. Electronically Signed   By: Garald Balding M.D.   On: 06/23/2017 05:00   Dg Chest Port 1 View  Result Date: 06/22/2017 CLINICAL DATA:  Central line placement EXAM: PORTABLE CHEST 1 VIEW COMPARISON:  06/22/2017, 06/09/2017 FINDINGS: Endotracheal tube tip is about 2.6 cm superior to the carina. Left-sided central venous catheter tip projects over the SVC. No left pneumothorax is seen. Increasing opacity at the left base. No pleural effusion. Normal heart size. Old bilateral rib fractures. IMPRESSION: 1. Left-sided central venous catheter tip overlies the SVC. Negative for pneumothorax 2. Increasing atelectasis or infiltrate at the left lung base. Electronically Signed   By: Donavan Foil M.D.   On: 06/22/2017 20:51   Dg Chest Port 1 View  Result Date: 06/22/2017 CLINICAL DATA:  Patient unresponsive with hypotension. 2 mg narcan given. EXAM: PORTABLE CHEST 1 VIEW COMPARISON:  06/09/2017 and 08/16/2016 FINDINGS: Endotracheal tube has tip 5.5 cm above the carina. Lungs are adequately inflated without consolidation, effusion or pneumothorax.  Cardiomediastinal silhouette  is within normal. Old left posterior rib fractures. IMPRESSION: No active disease. Endotracheal tube with tip 5.5 cm above the carina. Electronically Signed   By: Marin Olp M.D.   On: 06/22/2017 14:55    Medications:  I have reviewed the patient's current medications. Scheduled: . fentaNYL      . midazolam      . chlorhexidine gluconate (MEDLINE KIT)  15 mL Mouth Rinse BID  . famotidine  20 mg Per Tube BID  . feeding supplement (PRO-STAT SUGAR FREE 64)  30 mL Per Tube BID  . feeding supplement (VITAL HIGH PROTEIN)  1,000 mL Per Tube Q24H  . insulin aspart  0-9 Units Subcutaneous Q4H  . lipase/protease/amylase  24,000 Units Oral Q6H  . mouth rinse  15 mL Mouth Rinse 10 times per day    Assessment/Plan: Patient recently discontinued from sedation.  Has some responses but is not following commands.  On Keppra.  No further seizure-like activity noted.  MRI of the brain pending.    Recommendations: 1.  Will follow up results of MRI.  Based on MRI will determine need for further testing including EEG.   2.  Continue Keppra at current dose.     LOS: 2 days   Alexis Goodell, MD Neurology [phone] 06/24/2017  12:16 PM

## 2017-06-24 NOTE — Progress Notes (Signed)
[facility] [place] NAME: [name]    MR#:  [id number]  DATE OF BIRTH:  1961-01-20  SUBJECTIVE:  CHIEF COMPLAINT:   Chief Complaint  Patient presents with  . Altered Mental Status   On vent. Off sedation Non purposeful movements Diarrhea.  REVIEW OF SYSTEMS:    Review of Systems  Unable to perform ROS: Intubated   DRUG ALLERGIES:   Allergies  Allergen Reactions  . Aspirin Itching and Other (See Comments)    Reaction: abdominal pain    VITALS:  Blood pressure 104/68, pulse 68, temperature (!) 97.3 F (36.3 C), resp. rate 12, height 5\' 10"  (1.778 m), weight 53.8 kg (118 lb 9.7 oz), SpO2 100 %.  PHYSICAL EXAMINATION:   Physical Exam  GENERAL:  56 y.o.-year-old patient lying in the bed intubated HEENT: ETT tube  LUNGS: Coarse breath sounds CARDIOVASCULAR: S1, S2 ABDOMEN: Soft, nondistended. Bowel sounds present. Foley EXTREMITIES: No cyanosis   PSYCHIATRIC: The patient is restless  LABORATORY PANEL:   CBC Recent Labs  Lab 06/24/17 0505  WBC 16.0*  HGB 8.1*  HCT 24.9*  PLT 71*   ------------------------------------------------------------------------------------------------------------------ Chemistries  Recent Labs  Lab 06/23/17 0444  06/24/17 0505  NA 139   < > 140  K 3.3*   < > 4.8  CL 122*   < > 111  CO2 15*   < > 26  GLUCOSE 99   < > 173*  BUN 12   < > 10  CREATININE 0.95   < > 0.79  CALCIUM 7.8*   < > 7.7*  MG 2.0  --  1.8  AST 35  --   --   ALT 28  --   --   ALKPHOS 86  --   --   BILITOT 0.8  --   --    < > = values in this interval not displayed.   ------------------------------------------------------------------------------------------------------------------  Cardiac Enzymes Recent Labs  Lab 06/22/17 1336  TROPONINI <0.03   ------------------------------------------------------------------------------------------------------------------  RADIOLOGY:  Dg Abd 1 View  Result  Date: 06/23/2017 CLINICAL DATA:  Orogastric tube placement. EXAM: ABDOMEN - 1 VIEW COMPARISON:  CT of the abdomen and pelvis performed 06/22/2017 FINDINGS: The patient's enteric tube is noted ending overlying the body of the stomach. The stomach is decompressed. The visualized bowel gas pattern is unremarkable. Scattered air and stool filled loops of colon are seen; no abnormal dilatation of small bowel loops is seen to suggest small bowel obstruction. No free intra-abdominal air is identified, though evaluation for free air is limited on a single supine view. The visualized osseous structures are within normal limits; the sacroiliac joints are unremarkable in appearance. The visualized lung bases are essentially clear. IMPRESSION: Enteric tube noted ending overlying the body of the stomach. Electronically Signed   By: Garald Balding M.D.   On: 06/23/2017 04:59   Ct Head Wo Contrast  Result Date: 06/22/2017 CLINICAL DATA:  Unresponsive upon arrival with hypertension. Narcan given prior to arrival. EXAM: CT HEAD WITHOUT CONTRAST TECHNIQUE: Contiguous axial images were obtained from the base of the skull through the vertex without intravenous contrast. COMPARISON:  06/09/2017, 05/28/2017 and 08/16/2016 FINDINGS: Brain: Ventricles, cisterns and other CSF spaces are within normal. There is no mass, mass effect, shift of midline structures or acute hemorrhage. Bilateral basal ganglia calcifications are present. No evidence of acute infarction. Vascular: No hyperdense vessel or unexpected calcification. Skull: Normal. Negative for fracture or focal lesion. Sinuses/Orbits: No acute  finding. Other: None. IMPRESSION: Normal head CT. Electronically Signed   By: Marin Olp M.D.   On: 06/22/2017 15:39   Ct Abdomen Pelvis W Contrast  Result Date: 06/22/2017 CLINICAL DATA:  Pt in via [facility] from home; pt unresponsive upon arrival with snoring respirations, hypotensive in the 44'W systolic. EXAM: CT ABDOMEN AND PELVIS  WITH CONTRAST TECHNIQUE: Multidetector CT imaging of the abdomen and pelvis was performed using the standard protocol following bolus administration of intravenous contrast. CONTRAST:  43mL ISOVUE-300 IOPAMIDOL (ISOVUE-300) INJECTION 61% COMPARISON:  01/23/2017 FINDINGS: Lower chest: Hazy and reticular type opacity noted in the left lower lobe of the lung base. Small areas of hazy opacity noted at the base of the left upper lobe lingula. Minor dependent subsegmental atelectasis in the right lower lobe. Heart normal in size. Hepatobiliary: 12 mm low-density lesion in segment 7, stable consistent with a hemangioma. No other liver lesions. Gallbladder is unremarkable. No bile duct dilation. Pancreas: Extensive calcifications and pancreatic atrophy consistent with chronic pancreatitis, stable. No acute inflammation. No mass. Spleen: Normal in size without focal abnormality. Adrenals/Urinary Tract: No adrenal masses. Kidneys normal size, orientation and position. No renal masses or stones. Ureters normal course and caliber. The small amount of nondependent air is noted in the bladder. No bladder mass or wall thickening. Stomach/Bowel: Since prior CT, colon surgery has been performed. There is anastomosis staple at at the rectosigmoid junction. There is no bowel dilation to suggest obstruction. There is no colonic wall thickening or adjacent inflammation. A few diverticula are noted along the right colon. No evidence of diverticulitis. Small bowel is normal in caliber. Anastomotic staples are noted in the left upper quadrant, within the jejunum, unchanged from the prior study. No small bowel inflammation. Stomach is unremarkable. Vascular/Lymphatic: Aortic atherosclerosis. No enlarged abdominal or pelvic lymph nodes. Reproductive: Prostate mildly enlarged heterogeneous. It is stable from the prior study. Other: No ascites.  No abdominal wall hernia. Musculoskeletal: No osteoblastic or osteolytic lesions. No fracture or  acute finding. IMPRESSION: 1. Small patchy areas of opacity noted in the left lower lobe and base of the left upper lobe lingula. Although this may be atelectasis, pneumonia should be considered likely in the proper clinical setting. 2. No acute findings within the abdomen or pelvis. 3. No evidence of bowel obstruction or inflammation. 4. Status post sigmoid colon resection since the prior exam. No evidence of an operative complication. 5. Aortic atherosclerosis. Electronically Signed   By: Lajean Manes M.D.   On: 06/22/2017 15:46   Dg Chest Port 1 View  Result Date: 06/23/2017 CLINICAL DATA:  Acute onset of respiratory failure. EXAM: PORTABLE CHEST 1 VIEW COMPARISON:  Chest radiograph performed 06/22/2017 FINDINGS: The patient's endotracheal tube is seen ending 3-4 cm above the carina. A left IJ line is noted ending about the mid SVC. An enteric tube is noted ending overlying the body of the stomach. The lungs are well-aerated and clear. There is no evidence of focal opacification, pleural effusion or pneumothorax. The cardiomediastinal silhouette is within normal limits. No acute osseous abnormalities are seen. Chronic left-sided rib deformities are noted. IMPRESSION: 1. Endotracheal tube seen ending 3-4 cm above the carina. 2. No acute cardiopulmonary process seen. Electronically Signed   By: Garald Balding M.D.   On: 06/23/2017 05:00   Dg Chest Port 1 View  Result Date: 06/22/2017 CLINICAL DATA:  Central line placement EXAM: PORTABLE CHEST 1 VIEW COMPARISON:  06/22/2017, 06/09/2017 FINDINGS: Endotracheal tube tip is about 2.6 cm superior to the  carina. Left-sided central venous catheter tip projects over the SVC. No left pneumothorax is seen. Increasing opacity at the left base. No pleural effusion. Normal heart size. Old bilateral rib fractures. IMPRESSION: 1. Left-sided central venous catheter tip overlies the SVC. Negative for pneumothorax 2. Increasing atelectasis or infiltrate at the left lung  base. Electronically Signed   By: Donavan Foil M.D.   On: 06/22/2017 20:51   Dg Chest Port 1 View  Result Date: 06/22/2017 CLINICAL DATA:  Patient unresponsive with hypotension. 2 mg narcan given. EXAM: PORTABLE CHEST 1 VIEW COMPARISON:  06/09/2017 and 08/16/2016 FINDINGS: Endotracheal tube has tip 5.5 cm above the carina. Lungs are adequately inflated without consolidation, effusion or pneumothorax. Cardiomediastinal silhouette is within normal. Old left posterior rib fractures. IMPRESSION: No active disease. Endotracheal tube with tip 5.5 cm above the carina. Electronically Signed   By: Marin Olp M.D.   On: 06/22/2017 14:55   ASSESSMENT AND PLAN:   #Acute hypoxic respiratory failure with possible aspiration pneumonia and sepsis Continue full vent support IV abx Nebs, Cx pending  # Uncontrolled  diabetes mellitus with hypoglycemia Extended period of hypoglycemic. Patient is off sedation and not awake.  # h/o colon cancer status post colon resection CT abdomen and pelvis with no acute findings.  # Diarrhea C diff negative Likely due to his recent colectomy  # Acute encephalopathy Had prolonged hypoglycemia Waiting for MRI for prognosis Appreciate neurology input  Poor prognosis.   All the records are reviewed and case discussed with Care Management/Social Worker Management plans discussed with the patient, family and they are in agreement.  CODE STATUS: FULL CODE  DVT Prophylaxis: SCDs  TOTAL TIME TAKING CARE OF THIS PATIENT: 30 minutes.   Neita Carp M.D on 06/24/2017 at 11:42 AM  Between 7am to 6pm - Pager - [phone]  After 6pm go to [email or web address] - password [email or web address]  Office  [phone]  CC: Primary care physician; [name], MD  Note: This dictation was prepared with Dragon dictation along with smaller phrase technology. Any transcriptional errors that result from this process are unintentional.

## 2017-06-24 NOTE — Progress Notes (Signed)
Late entry 0610 - Notified Tukov, NP of K 4.8 with potassium infusing in IV fluids.

## 2017-06-24 NOTE — Progress Notes (Signed)
Phosphorus sent to lab STAT

## 2017-06-24 NOTE — Progress Notes (Signed)
Spoke with Patria Mane, NP concerning pt travel to MRI on 12/26.  Pt's temp is currently being managed with a Bair Hugger - primarily hypothermia.  Pt also agitated and moves head - vent becomes disconnected.  She stated that dayshift can take the patient down with warmed blankets.

## 2017-06-24 NOTE — Progress Notes (Signed)
Initial Nutrition Assessment  DOCUMENTATION CODES:   Severe malnutrition in context of chronic illness, Underweight  INTERVENTION:  Once Phosphorus is >/=2, recommend switching to Vital AF 1.2 at 10 mL/hr and advancing by 15 mL/hr Q8hrs to goal regimen of Vital AF 1.2 at 55 mL/hr (1320 mL goal daily volume) via OGT. Provides 1584 kcal, 99 grams of protein, 1069 mL H2O daily.  Can also discontinue Pro-Stat when patient is at goal.  Provide liquid MVI daily per tube.  Also recommend providing thiamine 100 mg daily per tube and folic acid 1 mg daily per tube.  Monitor magnesium, potassium, and phosphorus daily for at least 3 days, MD to replete as needed, as pt is at risk for refeeding syndrome given severe malnutrition.  NUTRITION DIAGNOSIS:   Severe Malnutrition related to chronic illness(pancreatitis s/p resection, hx EtOH abuse, stg III colon cancer s/p resection) as evidenced by severe fat depletion, severe muscle depletion.  GOAL:   Provide needs based on ASPEN/SCCM guidelines  MONITOR:   Vent status, Labs, Weight trends, TF tolerance, Skin, I & O's  REASON FOR ASSESSMENT:   Ventilator, Consult Enteral/tube feeding initiation and management  ASSESSMENT:   56 year old male with PMHx of DM type 2, BPH, pancreatitis s/p resection of pancreas, GERD, hx EtOH abuse, stage IIIb colon cancer s/p laparoscopic low anterior resection on 04/17/2017, recent MRSA Port-A-Cath with bacteremia s/p removal on 05/29/2017 who presented with AMS and was found to be profoundly hypoglycemic, was emergently intubated.   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  No family members present at time of RD assessment. Patient intubated but not on any sedation. Rectal tube in place (placed 12/26 at 0435 per chart). 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   Access: OGT placed  12/25; terminates in body of stomach per KUB 12/25; 65 cm at corner of mouth  MAP: 64-88 mmHg  Patient is currently intubated on ventilator support MV: 7 L/min Temp (24hrs), Avg:97.1 F (36.2 C), Min:95.5 F (35.3 C), Max:99 F (37.2 C)  Propofol: N/A  Medications reviewed and include: famotidine, Novolog 0-9 units Q4hrs, Creon 24000 units Q6hrs (mixed in applesauce), Keppra.  Labs reviewed: CBG 31-302 past 24 hrs, Phosphorus 1.7.  Discussed with RN. Tube feeding was turned down to 10 mL/hr overnight. No intolerance. Per discussion in rounds plan is to leave at 10 mL/hr until patient is on Creon.  NUTRITION - FOCUSED PHYSICAL EXAM:    Most Recent Value  Orbital Region  Severe depletion  Upper Arm Region  Severe depletion  Thoracic and Lumbar Region  Severe depletion  Buccal Region  Unable to assess  Temple Region  Severe depletion  Clavicle Bone Region  Severe depletion  Clavicle and Acromion Bone Region  Severe depletion  Scapular Bone Region  Unable to assess  Dorsal Hand  Unable to assess  Patellar Region  Severe depletion  Anterior Thigh Region  Severe depletion  Posterior Calf Region  Severe depletion  Edema (RD Assessment)  Moderate  Hair  Reviewed  Eyes  Unable to assess  Mouth  Unable to assess  Skin  Reviewed  Nails  Reviewed     Diet Order:  No diet orders on file  EDUCATION NEEDS:   No education needs have been identified at this time  Skin:  Skin Assessment: Skin Integrity Issues: Skin Integrity Issues:: Incisions Incisions: closed incisions to right chest and rectum  Last BM:  06/24/2017 - medium type 7 per rectal tube  Height:   Ht Readings from Last 1 Encounters:  06/23/17 5\' 10"  (1.778 m)    Weight:   Wt Readings from Last 1 Encounters:  06/23/17 118 lb 9.7 oz (53.8 kg)    Ideal Body Weight:  75.5 kg  BMI:  Body mass index is 17.02 kg/m.  Estimated Nutritional Needs:   Kcal:  6301 (PSU 2003b w/ MSJ 1373, Ve 7, Tmax  37.2)  Protein:  80-95 grams (1.5-1.8 grams/kg)  Fluid:  1.6-1.9 L/day (30-35 mL/kg)  Willey Blade, MS, RD, LDN Office: [phone] Pager: [phone] After Hours/Weekend Pager: [phone]

## 2017-06-24 NOTE — Progress Notes (Addendum)
PULMONARY / CRITICAL CARE MEDICINE   Name: [name] MRN: [id number] DOB: 12-05-1960    ADMISSION DATE:  06/22/2017 CONSULTATION DATE:  12/24  REFERRING MD: Margaretmary Eddy  PT PROFILE:   33 M with hx of recently resected colon cancer (04/17/17 at [facility]) and recent MRSA Port-A-Cath infection with bacteremia brought to [facility] ED by EMS for altered cognition and intubated in the ED for same.  Was found to be profoundly hypoglycemic by EMS.  Received dextrose without improvement in mental status.  Also received naloxone without improvement in cognition.   MAJOR EVENTS/TEST RESULTS: 12/24 admission via ED as noted above.  Transfused 1 unit PRBCs in the ED for hemoglobin 7.3.  Bicarbonate infusion initiated for profound hyperchloremic acidosis 12/24 CT head: No acute findings 12/24 CTAP: No acute findings in the abdomen or pelvis.  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 Rec continued HCO3 infusion 12/25 Persistent coma despite no sedatives.  12/25Neurology consultation: Concern for possible seizure. Keppra initiated MRI brain ordered 12/26 Severe diarrhea. TF rate reduced and pancreatic enzymes initiated 12/26 more responsive with some spontaneous movement but not F/C. Fentanyl infusion initiated, then DC's. Intermittent fentanyl/midaz 12/26 MRI brain:  12/26 EEG:   INDWELLING DEVICES:: ETT 12/24 >>  L IJ CVL 12/24 >>   MICRO DATA: Urine 12/24 >> 20k cfu yeast Resp 12/24 >> Moderate GNRs >>  Blood 12/24 >>  C diff PCR 12/26 >> NEG GI panel 12/26 >> NEG  ANTIMICROBIALS:  Vancomycin 12/24 >> 12/26 Pip-tazo 12/24 >> 12/26   SUBJECTIVE:  Slightly more responsive.  Not following commands.  RN, there is some spontaneous movement and withdrawal from pain however.  Continues to chew on ETT which prompted the initiation of a fentanyl infusion last night.Marland Kitchen    VITAL SIGNS: BP 104/68   Pulse 68   Temp (!) 97.3 F (36.3 C)    Resp 12   Ht 5\' 10"  (1.778 m)   Wt 53.8 kg (118 lb 9.7 oz)   SpO2 100%   BMI 17.02 kg/m   HEMODYNAMICS:    VENTILATOR SETTINGS: Vent Mode: PRVC FiO2 (%):  [24 %-25 %] 24 % Set Rate:  [14 bmp] 14 bmp Vt Set:  [500 mL] 500 mL PEEP:  [5 cmH20] 5 cmH20  INTAKE / OUTPUT: I/O last 3 completed shifts: In: 4851.9 [I.V.:3543.5; NG/GT:497.3; IV Piggyback:811] Out: 2815 [Urine:2615; Stool:200]  PHYSICAL EXAMINATION: General: intubated, sedated, unresponsive presently Neuro: gaze now midline, no withdrawal, no spontaneous movement HEENT: NCAT, sclerae white Cardiovascular: Regular, no M Lungs: Clear anteriorly with full breath sounds Abdomen: Scaphoid, + bowel sounds, no palpable masses Ext: warm, no edema  LABS:  BMET Recent Labs  Lab 06/23/17 1212 06/23/17 1804 06/24/17 0505  NA 139 139 140  K 3.1* 2.8* 4.8  CL 118* 115* 111  CO2 18* 21* 26  BUN 11 12 10   CREATININE 1.01 0.93 0.79  GLUCOSE 171* 243* 173*    Electrolytes Recent Labs  Lab 06/22/17 1815  06/23/17 0444 06/23/17 1212 06/23/17 1804 06/24/17 0505  CALCIUM 7.5*   < > 7.8* 7.8* 7.2* 7.7*  MG 1.3*  --  2.0  --   --  1.8  PHOS  --   --   --   --   --  1.7*   < > = values in this interval not displayed.    CBC Recent Labs  Lab 06/23/17 0133 06/23/17 0444 06/24/17 0505  WBC 11.8* 11.4*  16.0*  HGB 7.6* 8.1* 8.1*  HCT 24.1* 25.1* 24.9*  PLT 84* 86* 71*    Coag's Recent Labs  Lab 06/22/17 1815  APTT 43*  INR 1.32    Sepsis Markers Recent Labs  Lab 06/22/17 1334 06/22/17 1815 06/22/17 2129 06/23/17 0444 06/24/17 0505  LATICACIDVEN 1.1 0.8 1.0  --   --   PROCALCITON 0.11 <0.10  --  <0.10 0.24    ABG Recent Labs  Lab 06/22/17 1424  PHART 7.23*  PCO2ART 24*  PO2ART 233*    Liver Enzymes Recent Labs  Lab 06/17/17 1307 06/22/17 1336 06/23/17 0444  AST 54* 30 35  ALT 47 32 28  ALKPHOS 149* 93 86  BILITOT 0.4 0.5 0.8  ALBUMIN 2.5* 2.1* 1.9*    Cardiac Enzymes Recent  Labs  Lab 06/22/17 1336  TROPONINI <0.03    Glucose Recent Labs  Lab 06/24/17 0206 06/24/17 0222 06/24/17 0421 06/24/17 0527 06/24/17 0831 06/24/17 1134  GLUCAP 31* 190* 161* 161* 156* 125*    CXR: No new film    ASSESSMENT / PLAN:  PULMONARY A: Acute respiratory failure due to coma P:   Cont vent support - settings reviewed and/or adjusted Cont vent bundle Daily SBT if/when meets criteria   CARDIOVASCULAR A:  Hypotension, resolved with IVF's Sinus bradycardia, resolved P:  Monitor BP and rhythm Norepi as needed to maintain MAP > 60 mmHg  RENAL A:   AKI Severe hyperchloremic acidosis - resolved Hypokalemia Hypernatremia, resolved P:   Monitor BMET intermittently Monitor I/Os Correct electrolytes as indicated Discontinue HCO3 infusion  GASTROINTESTINAL A:   Recent bowel resection Chronic pancreatitis  Severe pancreatic insufficiency Severe protein-calorie malnutrition P:   SUP: Enteral famotidine TF protocol initiated 12/25 - infusion rate reduced 12/26 due to diarrhea Initiate pancreatic enzyme replacement  HEMATOLOGIC A:   Acute on chronic anemia without overt blood loss Thrombocytopenia  P:  DVT px: SCDs Monitor CBC intermittently Transfuse per usual guidelines   INFECTIOUS A:   Doubt severe sepsis (PCT minimally elevated) Fungicuria -does not require treatment P:   Monitor temp, WBC count Micro and abx as above   ENDOCRINE A:   Type 2 diabetes Severe hypoglycemia - resolved Hyperglycemia P:   Monitor CBGs  Sensitive scale SSI initiated 12/26  NEUROLOGIC A:   Coma - suspect hypoglycemic brain injury P:   RASS goal: 0, -1 Minimize sedatives MRI results pending Neurology following EEG ordered   FAMILY  - Updates: No family at bedside  CCM time: 35 mins  The above time includes time spent in consultation with patient and/or family members and reviewing care plan on multidisciplinary rounds  Merton Border,  MD PCCM service Mobile [phone] Pager [phone]   06/24/2017, 12:55 PM

## 2017-06-24 NOTE — Progress Notes (Signed)
Pt was placed on a transport vent to transport to MRI. Once in MRI he was placed on a MRI compatible vent. He was transported back to CCU while on the transport vent.

## 2017-06-25 ENCOUNTER — Inpatient Hospital Stay: Payer: Medicaid Other

## 2017-06-25 DIAGNOSIS — G9340 Encephalopathy, unspecified: Secondary | ICD-10-CM

## 2017-06-25 DIAGNOSIS — J189 Pneumonia, unspecified organism: Secondary | ICD-10-CM

## 2017-06-25 DIAGNOSIS — J9601 Acute respiratory failure with hypoxia: Secondary | ICD-10-CM

## 2017-06-25 LAB — CBC
HCT: 23.7 % — ABNORMAL LOW (ref 40.0–52.0)
Hemoglobin: 7.9 g/dL — ABNORMAL LOW (ref 13.0–18.0)
MCH: 29.8 pg (ref 26.0–34.0)
MCHC: 33.1 g/dL (ref 32.0–36.0)
MCV: 90 fL (ref 80.0–100.0)
PLATELETS: 53 10*3/uL — AB (ref 150–440)
RBC: 2.63 MIL/uL — AB (ref 4.40–5.90)
RDW: 15.3 % — AB (ref 11.5–14.5)
WBC: 14.8 10*3/uL — AB (ref 3.8–10.6)

## 2017-06-25 LAB — BASIC METABOLIC PANEL
Anion gap: 3 — ABNORMAL LOW (ref 5–15)
BUN: 11 mg/dL (ref 6–20)
CALCIUM: 7.8 mg/dL — AB (ref 8.9–10.3)
CHLORIDE: 109 mmol/L (ref 101–111)
CO2: 27 mmol/L (ref 22–32)
CREATININE: 0.81 mg/dL (ref 0.61–1.24)
Glucose, Bld: 162 mg/dL — ABNORMAL HIGH (ref 65–99)
Potassium: 3.6 mmol/L (ref 3.5–5.1)
SODIUM: 139 mmol/L (ref 135–145)

## 2017-06-25 LAB — GLUCOSE, CAPILLARY
GLUCOSE-CAPILLARY: 111 mg/dL — AB (ref 65–99)
GLUCOSE-CAPILLARY: 117 mg/dL — AB (ref 65–99)
GLUCOSE-CAPILLARY: 117 mg/dL — AB (ref 65–99)
GLUCOSE-CAPILLARY: 127 mg/dL — AB (ref 65–99)
GLUCOSE-CAPILLARY: 153 mg/dL — AB (ref 65–99)
GLUCOSE-CAPILLARY: 92 mg/dL (ref 65–99)
Glucose-Capillary: 58 mg/dL — ABNORMAL LOW (ref 65–99)
Glucose-Capillary: 114 mg/dL — ABNORMAL HIGH (ref 65–99)

## 2017-06-25 LAB — TRIGLYCERIDES: TRIGLYCERIDES: 103 mg/dL (ref <150)

## 2017-06-25 LAB — ETHYLENE GLYCOL: ETHYLENE GLYCOL LVL: NOT DETECTED mg/dL

## 2017-06-25 LAB — PHOSPHORUS: Phosphorus: 3.2 mg/dL (ref 2.5–4.6)

## 2017-06-25 LAB — MAGNESIUM: Magnesium: 1.9 mg/dL (ref 1.7–2.4)

## 2017-06-25 MED ORDER — LACTATED RINGERS IV SOLN
INTRAVENOUS | Status: DC
  Administered 2017-06-25: 05:00:00 via INTRAVENOUS

## 2017-06-25 MED ORDER — PIPERACILLIN-TAZOBACTAM 3.375 G IVPB
3.3750 g | Freq: Three times a day (TID) | INTRAVENOUS | Status: DC
  Administered 2017-06-25: 3.375 g via INTRAVENOUS
  Filled 2017-06-25: qty 50

## 2017-06-25 MED ORDER — DEXTROSE 5 % IV SOLN
2.0000 g | Freq: Three times a day (TID) | INTRAVENOUS | Status: DC
  Administered 2017-06-25 – 2017-06-29 (×13): 2 g via INTRAVENOUS
  Filled 2017-06-25 (×14): qty 2

## 2017-06-25 MED ORDER — DEXTROSE 50 % IV SOLN
25.0000 mL | Freq: Once | INTRAVENOUS | Status: AC
  Administered 2017-06-25: 25 mL via INTRAVENOUS

## 2017-06-25 MED ORDER — FAMOTIDINE 20 MG PO TABS
20.0000 mg | ORAL_TABLET | Freq: Every day | ORAL | Status: DC
  Administered 2017-06-25 – 2017-07-01 (×7): 20 mg
  Filled 2017-06-25 (×8): qty 1

## 2017-06-25 MED ORDER — VITAL HIGH PROTEIN PO LIQD
1000.0000 mL | ORAL | Status: DC
  Administered 2017-06-25 – 2017-06-30 (×7): 1000 mL

## 2017-06-25 MED ORDER — PANCRELIPASE (LIP-PROT-AMYL) 12000-38000 UNITS PO CPEP
36000.0000 [IU] | ORAL_CAPSULE | Freq: Four times a day (QID) | ORAL | Status: DC
  Administered 2017-06-25 – 2017-07-02 (×24): 36000 [IU] via ORAL
  Filled 2017-06-25 (×25): qty 3

## 2017-06-25 MED ORDER — DEXTROSE 50 % IV SOLN
INTRAVENOUS | Status: AC
  Administered 2017-06-25: 25 mL via INTRAVENOUS
  Filled 2017-06-25: qty 50

## 2017-06-25 MED ORDER — DEXMEDETOMIDINE HCL IN NACL 400 MCG/100ML IV SOLN
0.4000 ug/kg/h | INTRAVENOUS | Status: DC
  Administered 2017-06-25: 0.5 ug/kg/h via INTRAVENOUS
  Administered 2017-06-26: 0.8 ug/kg/h via INTRAVENOUS
  Administered 2017-06-26: 0.5 ug/kg/h via INTRAVENOUS
  Administered 2017-06-27: 1 ug/kg/h via INTRAVENOUS
  Administered 2017-06-27: 0.9 ug/kg/h via INTRAVENOUS
  Administered 2017-06-28: 0.8 ug/kg/h via INTRAVENOUS
  Filled 2017-06-25 (×7): qty 100

## 2017-06-25 MED ORDER — POTASSIUM CHLORIDE 2 MEQ/ML IV SOLN
INTRAVENOUS | Status: DC
  Administered 2017-06-25 – 2017-06-28 (×5): via INTRAVENOUS
  Filled 2017-06-25 (×8): qty 1000

## 2017-06-25 MED ORDER — VANCOMYCIN HCL IN DEXTROSE 750-5 MG/150ML-% IV SOLN
750.0000 mg | Freq: Two times a day (BID) | INTRAVENOUS | Status: DC
  Administered 2017-06-25 (×2): 750 mg via INTRAVENOUS
  Filled 2017-06-25 (×4): qty 150

## 2017-06-25 NOTE — Progress Notes (Signed)
Notified Dana, NP of flexiseal output of 1L along with multiple bed changes due to leakage of stool.

## 2017-06-25 NOTE — Progress Notes (Signed)
PULMONARY / CRITICAL CARE MEDICINE   Name: [name] MRN: [id number] DOB: Nov 19, 1960    ADMISSION DATE:  06/22/2017 CONSULTATION DATE:  12/24  REFERRING MD: Margaretmary Eddy  PT PROFILE:   21 M with hx of recently resected colon cancer (04/17/17 at [facility]) and recent MRSA Port-A-Cath infection with bacteremia brought to [facility] ED by EMS for altered cognition and intubated in the ED for same.  Was found to be profoundly hypoglycemic by EMS.  Received dextrose without improvement in mental status.  Also received naloxone without improvement in cognition.   MAJOR EVENTS/TEST RESULTS: 12/24 admission via ED as noted above.  Transfused 1 unit PRBCs in the ED for hemoglobin 7.3.  Bicarbonate infusion initiated for profound hyperchloremic acidosis 12/24 CT head: No acute findings 12/24 CTAP: No acute findings in the abdomen or pelvis.  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 Rec continued HCO3 infusion 12/25 Persistent coma despite no sedatives.  12/25Neurology consultation: Concern for possible seizure. Keppra initiated MRI brain ordered 12/26 Severe diarrhea. TF rate reduced and pancreatic enzymes initiated 12/26 more responsive with some spontaneous movement but not F/C. Fentanyl infusion initiated, then DC's. Intermittent fentanyl/midaz 12/26 MRI brain: Borderline FLAIR hyperintensity/edema throughout the cerebral cortex, but no visible swelling on the other sequences. This could be related to technique, global injury, or seizure phenomenon 12/26 EEG: This is an abnormal electroencephalogram secondary to a slow, unreactive, discontinuous background.  This finding is nonspecific and may be seen in many clinical scenarios.  No epileptiform activity is noted 12/27 increased psychomotor agitation.  Nonpurposeful.  Increased purulent respiratory secretions.  INDWELLING DEVICES:: ETT 12/24 >>  L IJ CVL 12/24 >>   MICRO DATA: Urine  12/24 >> 20k cfu yeast Resp 12/24 >> Moderate GNRs >>  Blood 12/24 >> NEG C diff PCR 12/26 >> NEG GI panel 12/26 >> NEG  ANTIMICROBIALS:  Pip-tazo 12/24 >> 12/26 Vancomycin 12/24 >>  Cefepime 12/27 >>    SUBJECTIVE:   increased psychomotor agitation.  Nonpurposeful.  Increased purulent respiratory secretions.  VITAL SIGNS: BP 99/65 (BP Location: Left Arm)   Pulse 73   Temp 98.1 F (36.7 C) (Rectal)   Resp 15   Ht 5\' 10"  (1.778 m)   Wt 55.8 kg (123 lb 0.3 oz)   SpO2 97%   BMI 17.65 kg/m   HEMODYNAMICS:    VENTILATOR SETTINGS: Vent Mode: PRVC FiO2 (%):  [24 %] 24 % Set Rate:  [14 bmp] 14 bmp Vt Set:  [500 mL] 500 mL PEEP:  [5 cmH20] 5 cmH20 Pressure Support:  [10 cmH20] 10 cmH20  INTAKE / OUTPUT: I/O last 3 completed shifts: In: 2620.7 [I.V.:1026.2; NG/GT:763.5; IV Piggyback:831] Out: 2831 [Urine:1610; DVVOH:6073]  PHYSICAL EXAMINATION: General: intubated, sedated, unresponsive presently Neuro: gaze now midline, no withdrawal, no spontaneous movement HEENT: NCAT, sclerae white Cardiovascular: Regular, no M Lungs: Diffuse rhonchi Abdomen: Scaphoid, + bowel sounds, no palpable masses Ext: warm, no edema  LABS:  BMET Recent Labs  Lab 06/24/17 0505 06/24/17 1930 06/25/17 0415  NA 140 140 139  K 4.8 3.5 3.6  CL 111 109 109  CO2 26 27 27   BUN 10 10 11   CREATININE 0.79 0.73 0.81  GLUCOSE 173* 109* 162*    Electrolytes Recent Labs  Lab 06/23/17 0444  06/24/17 0505 06/24/17 1930 06/25/17 0415  CALCIUM 7.8*   < > 7.7* 7.7* 7.8*  MG 2.0  --  1.8  --  1.9  PHOS  --   --  1.7* 2.1* 3.2   < > = values in this interval not displayed.    CBC Recent Labs  Lab 06/23/17 0444 06/24/17 0505 06/25/17 0415  WBC 11.4* 16.0* 14.8*  HGB 8.1* 8.1* 7.9*  HCT 25.1* 24.9* 23.7*  PLT 86* 71* 53*    Coag's Recent Labs  Lab 06/22/17 1815  APTT 43*  INR 1.32    Sepsis Markers Recent Labs  Lab 06/22/17 1334 06/22/17 1815 06/22/17 2129  06/23/17 0444 06/24/17 0505  LATICACIDVEN 1.1 0.8 1.0  --   --   PROCALCITON 0.11 <0.10  --  <0.10 0.24    ABG Recent Labs  Lab 06/22/17 1424  PHART 7.23*  PCO2ART 24*  PO2ART 233*    Liver Enzymes Recent Labs  Lab 06/22/17 1336 06/23/17 0444  AST 30 35  ALT 32 28  ALKPHOS 93 86  BILITOT 0.5 0.8  ALBUMIN 2.1* 1.9*    Cardiac Enzymes Recent Labs  Lab 06/22/17 1336  TROPONINI <0.03    Glucose Recent Labs  Lab 06/24/17 1935 06/24/17 2356 06/25/17 0431 06/25/17 0736 06/25/17 0848 06/25/17 1158  GLUCAP 114* 127* 153* 58* 92 117*    CXR: RLL consolidation +/- effusion    ASSESSMENT / PLAN:  PULMONARY A: Acute respiratory failure due to coma RLL HCAP P:   Cont vent support - settings reviewed and/or adjusted Cont vent bundle Daily SBT if/when meets criteria  CARDIOVASCULAR A:  Hypotension, resolved Sinus bradycardia, resolved P:  Monitor BP and rhythm Norepi as needed to maintain MAP > 60 mmHg  RENAL A:   AKI, resolved Severe hyperchloremic acidosis - resolved Hypokalemia, resolved Hypernatremia, resolved Large volume loss due to diarrhea P:   Monitor BMET intermittently Monitor I/Os Correct electrolytes as indicated IVF's adjusted  GASTROINTESTINAL A:   Recent bowel resection for colon cancer Chronic pancreatitis  Severe pancreatic insufficiency Severe protein-calorie malnutrition Severe diarrhea P:   SUP: Enteral famotidine Increase pancreatic enzymes 12/27 Increase TF rate to 20 cc/h 12/27  HEMATOLOGIC A:   Acute on chronic anemia without overt blood loss Thrombocytopenia  P:  DVT px: SCDs Monitor CBC intermittently Transfuse per usual guidelines   INFECTIOUS A:   Fungicuria -does not require treatment RLL HCAP P:   Monitor temp, WBC count Micro and abx as above   ENDOCRINE A:   Type 2 diabetes Severe hypoglycemia - resolved Hyperglycemia, controlled P:   Monitor CBGs  Continue sensitive scale SSI  initiated 12/26  NEUROLOGIC A:   Acute encephalopathy- suspect hypoglycemic brain injury ICU/ventilator associated discomfort P:   RASS goal: 0, -1 PAD protocol-propofol, intermittent fentanyl Neurology following    FAMILY: Mother and sister updated at bedside 12/26 afternoon  CCM time: 35 mins  The above time includes time spent in consultation with patient and/or family members and reviewing care plan on multidisciplinary rounds  Merton Border, MD PCCM service Mobile [phone] Pager [phone]   06/25/2017, 1:20 PM

## 2017-06-25 NOTE — Progress Notes (Signed)
   06/25/17 0100  Vitals  Temp (!) 93.9 F (34.4 C)  BP (!) 70/53  MAP (mmHg) (!) 61  Pulse Rate (!) 56  ECG Heart Rate (!) 56  Resp 14   Rectal temp inserted.  Bair huggar on high setting.

## 2017-06-25 NOTE — Progress Notes (Signed)
[place] [facility] [place] NAME: [name]    MR#:  [id number]  DATE OF BIRTH:  [birth year]  SUBJECTIVE:  CHIEF COMPLAINT:   Chief Complaint  Patient presents with  . Altered Mental Status   On vent. On propofol and fentanyl due to agitation  REVIEW OF SYSTEMS:    Review of Systems  Unable to perform ROS: Intubated   DRUG ALLERGIES:   Allergies  Allergen Reactions  . Aspirin Itching and Other (See Comments)    Reaction: abdominal pain    VITALS:  Blood pressure 105/71, pulse 84, temperature 99.5 F (37.5 C), resp. rate 15, height 5\' 10"  (1.778 m), weight 55.8 kg (123 lb 0.3 oz), SpO2 98 %.  PHYSICAL EXAMINATION:   Physical Exam  GENERAL:  56 y.o.-year-old patient lying in the bed intubated HEENT: ETT tube  LUNGS: Coarse breath sounds CARDIOVASCULAR: S1, S2 ABDOMEN: Soft, nondistended. Bowel sounds present. Foley EXTREMITIES: No cyanosis   PSYCHIATRIC: The patient is rest less  LABORATORY PANEL:   CBC Recent Labs  Lab 06/25/17 0415  WBC 14.8*  HGB 7.9*  HCT 23.7*  PLT 53*   ------------------------------------------------------------------------------------------------------------------ Chemistries  Recent Labs  Lab 06/23/17 0444  06/25/17 0415  NA 139   < > 139  K 3.3*   < > 3.6  CL 122*   < > 109  CO2 15*   < > 27  GLUCOSE 99   < > 162*  BUN 12   < > 11  CREATININE 0.95   < > 0.81  CALCIUM 7.8*   < > 7.8*  MG 2.0   < > 1.9  AST 35  --   --   ALT 28  --   --   ALKPHOS 86  --   --   BILITOT 0.8  --   --    < > = values in this interval not displayed.   ------------------------------------------------------------------------------------------------------------------  Cardiac Enzymes Recent Labs  Lab 06/22/17 1336  TROPONINI <0.03   ------------------------------------------------------------------------------------------------------------------  RADIOLOGY:  Mr Jeri Cos Wo Contrast  Result Date:  06/24/2017 CLINICAL DATA:  Altered level of consciousness, unexplained. Uncontrolled diabetes with prolonged hypoglycemia. EXAM: MRI HEAD WITHOUT AND WITH CONTRAST TECHNIQUE: Multiplanar, multiecho pulse sequences of the brain and surrounding structures were obtained without and with intravenous contrast. CONTRAST:  53mL MULTIHANCE GADOBENATE DIMEGLUMINE 529 MG/ML IV SOLN COMPARISON:  Head CT from 2 days ago and earlier. FINDINGS: Brain: On FLAIR imaging the cortex appears diffusely hyperintense. Questionable restricted diffusion in the cortex on ADC map. On axial T1 weighted imaging sulci are questionably narrower than on more remote head CTs. No focal infarct, hemorrhage, hydrocephalus, or collection. No midline shift. Vascular: Major flow voids are preserved. Skull and upper cervical spine: Negative Sinuses/Orbits: No significant finding. IMPRESSION: No focal or reversible finding. Borderline FLAIR hyperintensity/edema throughout the cerebral cortex, but no visible swelling on the other sequences. This could be related to technique, global injury, or seizure phenomenon. Electronically Signed   By: Monte Fantasia M.D.   On: 06/24/2017 13:35   Dg Chest Port 1 View  Result Date: 06/25/2017 CLINICAL DATA:  56 year old male with a history of respiratory failure EXAM: PORTABLE CHEST 1 VIEW COMPARISON:  06/23/2017, 06/22/2017 FINDINGS: Cardiomediastinal silhouette unchanged in size and contour. Unchanged position of the endotracheal tube, which terminates approximately 4.7 cm above the carina. Unchanged position of left IJ central venous catheter with the tip appearing to terminate at the superior vena cava. Unchanged  gastric tube terminating in the left upper quadrant. Interval development of dense opacity at the right base with maintained visualization of the right heart border, and obscuration of the right hemidiaphragm. No pneumothorax. Aeration maintained on the left. IMPRESSION: Interval development of  right lower lobe collapse/ consolidation, potentially secondary to aspiration and/or mucous plugging. Unchanged endotracheal tube, gastric tube, and left IJ central venous catheter. Electronically Signed   By: Corrie Mckusick D.O.   On: 06/25/2017 07:40   ASSESSMENT AND PLAN:   #Acute hypoxic respiratory failure with possible aspiration pneumonia and sepsis Continue full vent support IV abx Nebs, Cx pending  # Uncontrolled  diabetes mellitus with hypoglycemia Extended period of hypoglycemic. Patient is off sedation and not awake.  # h/o colon cancer status post colon resection CT abdomen and pelvis with no acute findings.  # Diarrhea C diff negative Likely due to his recent colectomy  # Acute encephalopathy Had prolonged hypoglycemia MRI raise concern regarding hypoperfusion or seizure.  EEG just with slow activity and no seizure-like activity. Appreciate neurology input Need to reassess once patient is off sedation.  All the records are reviewed and case discussed with Care Management/Social Worker Management plans discussed with the patient, family and they are in agreement.  CODE STATUS: FULL CODE  DVT Prophylaxis: SCDs  TOTAL TIME TAKING CARE OF THIS PATIENT: 30 minutes.   Leia Alf Danyle Boening M.D on 06/25/2017 at 4:14 PM  Between 7am to 6pm - Pager - [phone]  After 6pm go to [email or web address] - password [email or web address] [facility]  Office  [phone]  CC: Primary care physician; [name], MD  Note: This dictation was prepared with Dragon dictation along with smaller phrase technology. Any transcriptional errors that result from this process are unintentional.

## 2017-06-25 NOTE — Progress Notes (Signed)
Subjective: Patient has been agitated but not purposeful.  Due to agitation has been on Propofol and Fentanyl.    Objective: Current vital signs: BP 98/65   Pulse 60   Temp (!) 95.4 F (35.2 C)   Resp 14   Ht _0  (1.778 m)   Wt 55.8 kg (123 lb 0.3 oz)   SpO2 98%   BMI 17.65 kg/m  Vital signs in last 24 hours: Temp:  [93.7 F (34.3 C)-98.1 F (36.7 C)] 95.4 F (35.2 C) (12/27 1000) Pulse Rate:  [52-74] 60 (12/27 1000) Resp:  [14-20] 14 (12/27 1000) BP: (70-112)/(53-81) 98/65 (12/27 1000) SpO2:  [97 %-100 %] 98 % (12/27 1000) FiO2 (%):  [24 %] 24 % (12/27 1138) Weight:  [55.8 kg (123 lb 0.3 oz)] 55.8 kg (123 lb 0.3 oz) (12/27 0500)  Intake/Output from previous day: 12/26 0701 - 12/27 0700 In: 808.6 [I.V.:155.1; NG/GT:283.5; IV Piggyback:370] Out: 2345 [Urine:945; Stool:1400] Intake/Output this shift: Total I/O In: 255.4 [I.V.:25.4; NG/GT:30; IV Piggyback:200] Out: -  Nutritional status: No diet orders on file  Neurologic Exam: Mental Status: Patient does not respond to verbal stimuli.  With light tactile stimulation will start to thrash head and resist any attempts to move his head.  Does not follow commands.  No verbalizations noted.  Cranial Nerves: II: patient does not respond confrontation bilaterally, pupils right 3 mm, left 3 mm,and sluggishly reactive bilaterally III,IV,VI: doll's response present bilaterally.  V,VII: corneal reflex absent bilaterally  VIII: patient does not respond to verbal stimuli IX,X: gag reflex reduced, XI: trapezius strength unable to test bilaterally XII: tongue strength unable to test Motor: Extremities flaccid throughout.  No spontaneous movement noted.  No purposeful movements noted. Sensory: Does not respond to noxious stimuli in any extremity.  Lab Results: Basic Metabolic Panel: Recent Labs  Lab 06/22/17 1815  06/23/17 0444  06/23/17 1212 06/23/17 1804 06/24/17 0505 06/24/17 1930 06/25/17 0415  NA 140   < > 139  --   139 139 140 140 139  K 2.9*   < > 3.3*   < > 3.1* 2.8* 4.8 3.5 3.6  CL 129*   < > 122*  --  118* 115* 111 109 109  CO2 10*   < > 15*  --  18* 21* _1 GLUCOSE 116*   < > 99  --  171* 243* 173* 109* 162*  BUN 15   < > 12  --  _2 CREATININE 0.94   < > 0.95  --  1.01 0.93 0.79 0.73 0.81  CALCIUM 7.5*   < > 7.8*  --  7.8* 7.2* 7.7* 7.7* 7.8*  MG 1.3*  --  2.0  --   --   --  1.8  --  1.9  PHOS  --   --   --   --   --   --  1.7* 2.1* 3.2   < > = values in this interval not displayed.    Liver Function Tests: Recent Labs  Lab 06/22/17 1336 06/23/17 0444  AST 30 35  ALT 32 28  ALKPHOS 93 86  BILITOT 0.5 0.8  PROT 5.8* 5.4*  ALBUMIN 2.1* 1.9*   Recent Labs  Lab 06/22/17 1336  LIPASE 12   No results for input(s): AMMONIA in the last 168 hours.  CBC: Recent Labs  Lab 06/22/17 1336 06/22/17 1815 06/23/17 0133 06/23/17 0444 06/24/17 0505 06/25/17 0415  WBC 13.9* 12.9* 11.8* 11.4* 16.0*  14.8*  NEUTROABS 12.5* 11.5*  --   --   --   --   HGB 7.3* 6.5* 7.6* 8.1* 8.1* 7.9*  HCT 23.4* 21.1* 24.1* 25.1* 24.9* 23.7*  MCV 94.3 94.7 91.3 91.4 90.0 90.0  PLT 98* 82* 84* 86* 71* 53*    Cardiac Enzymes: Recent Labs  Lab 06/22/17 1336  TROPONINI <0.03    Lipid Panel: Recent Labs  Lab 06/24/17 2335  TRIG 103    CBG: Recent Labs  Lab 06/24/17 1935 06/24/17 2356 06/25/17 0431 06/25/17 0736 06/25/17 0848  GLUCAP 114* 127* 153* 58* 92    Microbiology: Results for orders placed or performed during the hospital encounter of 06/22/17  Blood Culture (routine x 2)     Status: None (Preliminary result)   Collection Time: 06/22/17  1:34 PM  Result Value Ref Range Status   Specimen Description BLOOD LEFT ANTECUBITAL  Final   Special Requests   Final    BOTTLES DRAWN AEROBIC AND ANAEROBIC Blood Culture adequate volume   Culture   Final    NO GROWTH 3 DAYS Performed at [facility], [address], [place], [place] [address]    Report Status  PENDING  Incomplete  Blood Culture (routine x 2)     Status: None (Preliminary result)   Collection Time: 06/22/17  1:34 PM  Result Value Ref Range Status   Specimen Description BLOOD LEFT ANTECUBITAL  Final   Special Requests   Final    BOTTLES DRAWN AEROBIC AND ANAEROBIC Blood Culture adequate volume   Culture   Final    NO GROWTH 3 DAYS Performed at [facility], [address], [place], [place] [address]    Report Status PENDING  Incomplete  Urine culture     Status: Abnormal   Collection Time: 06/22/17  1:34 PM  Result Value Ref Range Status   Specimen Description   Final    URINE, RANDOM Performed at [facility], [address], [place], [place] [address]    Special Requests   Final    NONE Performed at [facility], [address] [place], [place] [address]    Culture 20,000 COLONIES/mL YEAST (A)  Final   Report Status 06/24/2017 FINAL  Final  MRSA PCR Screening     Status: None   Collection Time: 06/22/17  6:00 PM  Result Value Ref Range Status   MRSA by PCR NEGATIVE NEGATIVE Final    Comment:        The GeneXpert MRSA Assay (FDA approved for NASAL specimens only), is one component of a comprehensive MRSA colonization surveillance program. It is not intended to diagnose MRSA infection nor to guide or monitor treatment for MRSA infections. Performed at [facility], [address] [place], [place] [address]   C difficile quick scan w PCR reflex     Status: None   Collection Time: 06/24/17  5:05 AM  Result Value Ref Range Status   C Diff antigen NEGATIVE NEGATIVE Final   C Diff toxin NEGATIVE NEGATIVE Final   C Diff interpretation No C. difficile detected.  Final    Comment: Performed at [facility], [address] [place], [place] [address]  Gastrointestinal Panel by PCR , Stool     Status: None   Collection Time: 06/24/17  5:05 AM  Result Value Ref Range Status   Campylobacter species NOT DETECTED NOT  DETECTED Final   Plesimonas shigelloides NOT DETECTED NOT DETECTED Final   Salmonella species  NOT DETECTED NOT DETECTED Final   Yersinia enterocolitica NOT DETECTED NOT DETECTED Final   Vibrio species NOT DETECTED NOT DETECTED Final   Vibrio cholerae NOT DETECTED NOT DETECTED Final   Enteroaggregative E coli (EAEC) NOT DETECTED NOT DETECTED Final   Enteropathogenic E coli (EPEC) NOT DETECTED NOT DETECTED Final   Enterotoxigenic E coli (ETEC) NOT DETECTED NOT DETECTED Final   Shiga like toxin producing E coli (STEC) NOT DETECTED NOT DETECTED Final   Shigella/Enteroinvasive E coli (EIEC) NOT DETECTED NOT DETECTED Final   Cryptosporidium NOT DETECTED NOT DETECTED Final   Cyclospora cayetanensis NOT DETECTED NOT DETECTED Final   Entamoeba histolytica NOT DETECTED NOT DETECTED Final   Giardia lamblia NOT DETECTED NOT DETECTED Final   Adenovirus F40/41 NOT DETECTED NOT DETECTED Final   Astrovirus NOT DETECTED NOT DETECTED Final   Norovirus GI/GII NOT DETECTED NOT DETECTED Final   Rotavirus A NOT DETECTED NOT DETECTED Final   Sapovirus (I, II, IV, and V) NOT DETECTED NOT DETECTED Final    Comment: Performed at [facility], [address] [place], [place] [address]  Culture, respiratory (NON-Expectorated)     Status: None (Preliminary result)   Collection Time: 06/24/17  5:30 AM  Result Value Ref Range Status   Specimen Description   Final    TRACHEAL ASPIRATE Performed at [facility], [address] [place], [place] [address]    Special Requests   Final    NONE Performed at [facility], [address] [place], [place] [address]    Gram Stain   Final    RARE WBC PRESENT, PREDOMINANTLY PMN MODERATE GRAM NEGATIVE RODS RARE YEAST    Culture   Final    CULTURE REINCUBATED FOR BETTER GROWTH Performed at [facility], [place] [address] [place], [place] [address]    Report Status PENDING  Incomplete    Coagulation Studies: Recent Labs     06/22/17 1815  LABPROT 16.3*  INR 1.32    Imaging: Mr Jeri Cos TD Contrast  Result Date: 06/24/2017 CLINICAL DATA:  Altered level of consciousness, unexplained. Uncontrolled diabetes with prolonged hypoglycemia. EXAM: MRI HEAD WITHOUT AND WITH CONTRAST TECHNIQUE: Multiplanar, multiecho pulse sequences of the brain and surrounding structures were obtained without and with intravenous contrast. CONTRAST:  12m MULTIHANCE GADOBENATE DIMEGLUMINE 529 MG/ML IV SOLN COMPARISON:  Head CT from 2 days ago and earlier. FINDINGS: Brain: On FLAIR imaging the cortex appears diffusely hyperintense. Questionable restricted diffusion in the cortex on ADC map. On axial T1 weighted imaging sulci are questionably narrower than on more remote head CTs. No focal infarct, hemorrhage, hydrocephalus, or collection. No midline shift. Vascular: Major flow voids are preserved. Skull and upper cervical spine: Negative Sinuses/Orbits: No significant finding. IMPRESSION: No focal or reversible finding. Borderline FLAIR hyperintensity/edema throughout the cerebral cortex, but no visible swelling on the other sequences. This could be related to technique, global injury, or seizure phenomenon. Electronically Signed   By: JMonte FantasiaM.D.   On: 06/24/2017 13:35   Dg Chest Port 1 View  Result Date: 06/25/2017 CLINICAL DATA:  56year old male with a history of respiratory failure EXAM: PORTABLE CHEST 1 VIEW COMPARISON:  06/23/2017, 06/22/2017 FINDINGS: Cardiomediastinal silhouette unchanged in size and contour. Unchanged position of the endotracheal tube, which terminates approximately 4.7 cm above the carina. Unchanged position of left IJ central venous catheter with the tip appearing to terminate at the superior vena cava. Unchanged gastric tube terminating in the left upper quadrant. Interval development  of dense opacity at the right base with maintained visualization of the right heart border, and obscuration of the right  hemidiaphragm. No pneumothorax. Aeration maintained on the left. IMPRESSION: Interval development of right lower lobe collapse/ consolidation, potentially secondary to aspiration and/or mucous plugging. Unchanged endotracheal tube, gastric tube, and left IJ central venous catheter. Electronically Signed   By: Corrie Mckusick D.O.   On: 06/25/2017 07:40    Medications:  I have reviewed the patient's current medications. Scheduled: . chlorhexidine gluconate (MEDLINE KIT)  15 mL Mouth Rinse BID  . famotidine  20 mg Per Tube Daily  . feeding supplement (PRO-STAT SUGAR FREE 64)  30 mL Per Tube BID  . feeding supplement (VITAL HIGH PROTEIN)  1,000 mL Per Tube Q24H  . insulin aspart  0-9 Units Subcutaneous Q4H  . lipase/protease/amylase  36,000 Units Oral Q6H  . mouth rinse  15 mL Mouth Rinse 10 times per day  . multivitamin  15 mL Per Tube Daily    Assessment/Plan: Patient has not regained baseline mental status.  MRI of the brain reviewed and shows prominence of the cerebral cortex on flair.  Differential is large and in this patient may be secondary to hypoperfusion, hypoglycemia or seizure activity.  EEG slow but without evidence of seizure activity on Keppra.    Recommendations: 1.  Continue Keppra at current dose. 2.  Patient does not fulfill criteria for brain death would continue to support patient to see if he will have further improvement.      LOS: 3 days   Alexis Goodell, MD Neurology [phone] 06/25/2017  11:59 AM

## 2017-06-25 NOTE — Progress Notes (Signed)
Pharmacy Antibiotic Note  [name] is a 56 y.o. male with recnet MRSA port-a-cath infection admitted on 06/22/2017 with pneumonia.  Pharmacy has been consulted for vancomycin and cefepime dosing.  Plan: Ke= 0.07 h-1 Vd= 39 L   Vancomycin 750 mg iv q 12 hours. Will plan on checking a trough with the 5th dose. Goal trough 15-20 mcg/ml.   Cefepime 2 g iv q 8 hours.   Height: 5\' 10"  (177.8 cm) Weight: 123 lb 0.3 oz (55.8 kg) IBW/kg (Calculated) : 73  Temp (24hrs), Avg:96 F (35.6 C), Min:93.7 F (34.3 C), Max:98.1 F (36.7 C)  Recent Labs  Lab 06/22/17 1334  06/22/17 1815 06/22/17 2129 06/23/17 0133 06/23/17 0444 06/23/17 1212 06/23/17 1804 06/24/17 0505 06/24/17 1930 06/25/17 0415  WBC  --    < > 12.9*  --  11.8* 11.4*  --   --  16.0*  --  14.8*  CREATININE  --    < > 0.94 0.97 1.03 0.95 1.01 0.93 0.79 0.73 0.81  LATICACIDVEN 1.1  --  0.8 1.0  --   --   --   --   --   --   --    < > = values in this interval not displayed.    Estimated Creatinine Clearance: 79.4 mL/min (by C-G formula based on SCr of 0.81 mg/dL).    Allergies  Allergen Reactions  . Aspirin Itching and Other (See Comments)    Reaction: abdominal pain    Antimicrobials this admission: vancomycin 12/24 >> 12/25, 12/27 >> Zosyn 12/24 >> 12/26 Cefepime 12/27 >>  Dose adjustments this admission:   Microbiology results: 12/24 BCx: NGTD 12/24 UCx: 20 K yeast  12/26 Sputum: GNR, rare yeast  12/24 MRSA PCR: negative 12/26 C diff: negative 12/26 GI PCR: negative  Thank you for allowing pharmacy to be a part of this patient's care.  Ulice Dash D 06/25/2017 10:42 AM

## 2017-06-26 ENCOUNTER — Inpatient Hospital Stay: Payer: Medicaid Other

## 2017-06-26 DIAGNOSIS — E43 Unspecified severe protein-calorie malnutrition: Secondary | ICD-10-CM

## 2017-06-26 LAB — CBC
HCT: 25.8 % — ABNORMAL LOW (ref 40.0–52.0)
Hemoglobin: 8.5 g/dL — ABNORMAL LOW (ref 13.0–18.0)
MCH: 30 pg (ref 26.0–34.0)
MCHC: 33.1 g/dL (ref 32.0–36.0)
MCV: 90.7 fL (ref 80.0–100.0)
PLATELETS: 58 10*3/uL — AB (ref 150–440)
RBC: 2.84 MIL/uL — ABNORMAL LOW (ref 4.40–5.90)
RDW: 15.7 % — AB (ref 11.5–14.5)
WBC: 18.6 10*3/uL — ABNORMAL HIGH (ref 3.8–10.6)

## 2017-06-26 LAB — CULTURE, RESPIRATORY

## 2017-06-26 LAB — CULTURE, RESPIRATORY W GRAM STAIN: Culture: NORMAL

## 2017-06-26 LAB — GLUCOSE, CAPILLARY
GLUCOSE-CAPILLARY: 141 mg/dL — AB (ref 65–99)
GLUCOSE-CAPILLARY: 134 mg/dL — AB (ref 65–99)
GLUCOSE-CAPILLARY: 150 mg/dL — AB (ref 65–99)
Glucose-Capillary: 153 mg/dL — ABNORMAL HIGH (ref 65–99)
Glucose-Capillary: 88 mg/dL (ref 65–99)

## 2017-06-26 LAB — BASIC METABOLIC PANEL
ANION GAP: 3 — AB (ref 5–15)
BUN: 10 mg/dL (ref 6–20)
CALCIUM: 7.9 mg/dL — AB (ref 8.9–10.3)
CO2: 26 mmol/L (ref 22–32)
CREATININE: 0.65 mg/dL (ref 0.61–1.24)
Chloride: 109 mmol/L (ref 101–111)
Glucose, Bld: 146 mg/dL — ABNORMAL HIGH (ref 65–99)
Potassium: 4 mmol/L (ref 3.5–5.1)
SODIUM: 138 mmol/L (ref 135–145)

## 2017-06-26 MED ORDER — MIDAZOLAM HCL 2 MG/2ML IJ SOLN
1.0000 mg | INTRAMUSCULAR | Status: DC | PRN
  Administered 2017-06-26 – 2017-07-02 (×14): 2 mg via INTRAVENOUS
  Filled 2017-06-26 (×15): qty 2

## 2017-06-26 MED ORDER — MIDAZOLAM HCL 2 MG/2ML IJ SOLN
INTRAMUSCULAR | Status: AC
  Administered 2017-06-26: 2 mg via INTRAVENOUS
  Filled 2017-06-26: qty 2

## 2017-06-26 MED ORDER — MIDAZOLAM HCL 2 MG/2ML IJ SOLN
2.0000 mg | Freq: Once | INTRAMUSCULAR | Status: AC
  Administered 2017-06-26: 2 mg via INTRAVENOUS

## 2017-06-26 MED ORDER — FENTANYL CITRATE (PF) 100 MCG/2ML IJ SOLN
25.0000 ug | INTRAMUSCULAR | Status: DC | PRN
Start: 2017-06-26 — End: 2017-07-02
  Administered 2017-06-26 – 2017-06-30 (×4): 50 ug via INTRAVENOUS
  Administered 2017-06-30: 100 ug via INTRAVENOUS
  Administered 2017-07-01: 50 ug via INTRAVENOUS
  Filled 2017-06-26 (×2): qty 2

## 2017-06-26 NOTE — Progress Notes (Signed)
Subjective: Patient sedated on Precedex    Objective: Current vital signs: BP 99/67 (BP Location: Left Arm)   Pulse (!) 56   Temp 97.8 F (36.6 C) (Oral)   Resp 14   Ht '5\' 10"'$  (1.778 m)   Wt 55.6 kg (122 lb 9.2 oz)   SpO2 99%   BMI 17.59 kg/m  Vital signs in last 24 hours: Temp:  [93.9 F (34.4 C)-99.5 F (37.5 C)] 97.8 F (36.6 C) (12/28 0800) Pulse Rate:  [51-84] 56 (12/28 0800) Resp:  [13-22] 14 (12/28 0800) BP: (66-105)/(50-71) 99/67 (12/28 0800) SpO2:  [94 %-100 %] 99 % (12/28 0800) FiO2 (%):  [24 %] 24 % (12/28 1023) Weight:  [55.6 kg (122 lb 9.2 oz)] 55.6 kg (122 lb 9.2 oz) (12/28 0500)  Intake/Output from previous day: 12/27 0701 - 12/28 0700 In: 2587.2 [I.V.:1440.6; NG/GT:326.7; IV Piggyback:820] Out: 735 [Urine:535; Stool:200] Intake/Output this shift: Total I/O In: 438 [I.V.:328; NG/GT:110] Out: -  Nutritional status: No diet orders on file  Neurologic Exam: Mental Status: Patient does not respond to verbal stimuli.  No resistance to active head movement.  No verbalizations noted.  Cranial Nerves: II: patient does not respond confrontation bilaterally, pupils right 3 mm, left 3 mm,and sluggishly reactive bilaterally III,IV,VI: doll's response present bilaterally.  V,VII: corneal reflex absent bilaterally  VIII: patient does not respond to verbal stimuli IX,X: gag reflex reduced, XI: trapezius strength unable to test bilaterally XII: tongue strength unable to test Motor: Extremities flaccid throughout.  No spontaneous movement noted.  No purposeful movements noted. Sensory: Does not respond to noxious stimuli in any extremity.  Lab Results: Basic Metabolic Panel: Recent Labs  Lab 06/22/17 1815  06/23/17 0444  06/23/17 1804 06/24/17 0505 06/24/17 1930 06/25/17 0415 06/26/17 0322  NA 140   < > 139   < > 139 140 140 139 138  K 2.9*   < > 3.3*   < > 2.8* 4.8 3.5 3.6 4.0  CL 129*   < > 122*   < > 115* 111 109 109 109  CO2 10*   < > 15*   < > 21*  '26 27 27 26  '$ GLUCOSE 116*   < > 99   < > 243* 173* 109* 162* 146*  BUN 15   < > 12   < > '12 10 10 11 10  '$ CREATININE 0.94   < > 0.95   < > 0.93 0.79 0.73 0.81 0.65  CALCIUM 7.5*   < > 7.8*   < > 7.2* 7.7* 7.7* 7.8* 7.9*  MG 1.3*  --  2.0  --   --  1.8  --  1.9  --   PHOS  --   --   --   --   --  1.7* 2.1* 3.2  --    < > = values in this interval not displayed.    Liver Function Tests: Recent Labs  Lab 06/22/17 1336 06/23/17 0444  AST 30 35  ALT 32 28  ALKPHOS 93 86  BILITOT 0.5 0.8  PROT 5.8* 5.4*  ALBUMIN 2.1* 1.9*   Recent Labs  Lab 06/22/17 1336  LIPASE 12   No results for input(s): AMMONIA in the last 168 hours.  CBC: Recent Labs  Lab 06/22/17 1336 06/22/17 1815 06/23/17 0133 06/23/17 0444 06/24/17 0505 06/25/17 0415 06/26/17 0510  WBC 13.9* 12.9* 11.8* 11.4* 16.0* 14.8* 18.6*  NEUTROABS 12.5* 11.5*  --   --   --   --   --  HGB 7.3* 6.5* 7.6* 8.1* 8.1* 7.9* 8.5*  HCT 23.4* 21.1* 24.1* 25.1* 24.9* 23.7* 25.8*  MCV 94.3 94.7 91.3 91.4 90.0 90.0 90.7  PLT 98* 82* 84* 86* 71* 53* 58*    Cardiac Enzymes: Recent Labs  Lab 06/22/17 1336  TROPONINI <0.03    Lipid Panel: Recent Labs  Lab 06/24/17 2335  TRIG 103    CBG: Recent Labs  Lab 06/25/17 1623 06/25/17 1918 06/25/17 2351 06/26/17 0326 06/26/17 0740  GLUCAP 114* 111* 117* 141* 134*    Microbiology: Results for orders placed or performed during the hospital encounter of 06/22/17  Blood Culture (routine x 2)     Status: None (Preliminary result)   Collection Time: 06/22/17  1:34 PM  Result Value Ref Range Status   Specimen Description BLOOD LEFT ANTECUBITAL  Final   Special Requests   Final    BOTTLES DRAWN AEROBIC AND ANAEROBIC Blood Culture adequate volume   Culture   Final    NO GROWTH 4 DAYS Performed at [facility], [address], [place], [place] [address]    Report Status PENDING  Incomplete  Blood Culture (routine x 2)     Status: None (Preliminary result)    Collection Time: 06/22/17  1:34 PM  Result Value Ref Range Status   Specimen Description BLOOD LEFT ANTECUBITAL  Final   Special Requests   Final    BOTTLES DRAWN AEROBIC AND ANAEROBIC Blood Culture adequate volume   Culture   Final    NO GROWTH 4 DAYS Performed at [facility], [address], [place], [place] [address]    Report Status PENDING  Incomplete  Urine culture     Status: Abnormal   Collection Time: 06/22/17  1:34 PM  Result Value Ref Range Status   Specimen Description   Final    URINE, RANDOM Performed at [facility], [address] [place], [place] [address]    Special Requests   Final    NONE Performed at [facility], [address], [place], [place] [address]    Culture 20,000 COLONIES/mL YEAST (A)  Final   Report Status 06/24/2017 FINAL  Final  MRSA PCR Screening     Status: None   Collection Time: 06/22/17  6:00 PM  Result Value Ref Range Status   MRSA by PCR NEGATIVE NEGATIVE Final    Comment:        The GeneXpert MRSA Assay (FDA approved for NASAL specimens only), is one component of a comprehensive MRSA colonization surveillance program. It is not intended to diagnose MRSA infection nor to guide or monitor treatment for MRSA infections. Performed at [facility], [address] [place], [place] [address]   C difficile quick scan w PCR reflex     Status: None   Collection Time: 06/24/17  5:05 AM  Result Value Ref Range Status   C Diff antigen NEGATIVE NEGATIVE Final   C Diff toxin NEGATIVE NEGATIVE Final   C Diff interpretation No C. difficile detected.  Final    Comment: Performed at [facility], [address] [place], [place] [address]  Gastrointestinal Panel by PCR , Stool     Status: None   Collection Time: 06/24/17  5:05 AM  Result Value Ref Range Status   Campylobacter species NOT DETECTED NOT DETECTED Final   Plesimonas shigelloides NOT DETECTED NOT DETECTED Final   Salmonella  species NOT DETECTED NOT DETECTED Final   Yersinia enterocolitica NOT DETECTED NOT DETECTED Final  Vibrio species NOT DETECTED NOT DETECTED Final   Vibrio cholerae NOT DETECTED NOT DETECTED Final   Enteroaggregative E coli (EAEC) NOT DETECTED NOT DETECTED Final   Enteropathogenic E coli (EPEC) NOT DETECTED NOT DETECTED Final   Enterotoxigenic E coli (ETEC) NOT DETECTED NOT DETECTED Final   Shiga like toxin producing E coli (STEC) NOT DETECTED NOT DETECTED Final   Shigella/Enteroinvasive E coli (EIEC) NOT DETECTED NOT DETECTED Final   Cryptosporidium NOT DETECTED NOT DETECTED Final   Cyclospora cayetanensis NOT DETECTED NOT DETECTED Final   Entamoeba histolytica NOT DETECTED NOT DETECTED Final   Giardia lamblia NOT DETECTED NOT DETECTED Final   Adenovirus F40/41 NOT DETECTED NOT DETECTED Final   Astrovirus NOT DETECTED NOT DETECTED Final   Norovirus GI/GII NOT DETECTED NOT DETECTED Final   Rotavirus A NOT DETECTED NOT DETECTED Final   Sapovirus (I, II, IV, and V) NOT DETECTED NOT DETECTED Final    Comment: Performed at [facility], [address] [place], [place] [address]  Culture, respiratory (NON-Expectorated)     Status: None   Collection Time: 06/24/17  5:30 AM  Result Value Ref Range Status   Specimen Description   Final    TRACHEAL ASPIRATE Performed at [facility], [address], [place], [place] [address]    Special Requests   Final    NONE Performed at [facility], [address] [place], [place] [address]    Gram Stain   Final    RARE WBC PRESENT, PREDOMINANTLY PMN MODERATE GRAM NEGATIVE RODS RARE YEAST    Culture   Final    MODERATE Consistent with normal respiratory flora. Performed at [facility], [place] [address], [place], [place] [address]    Report Status 06/26/2017 FINAL  Final    Coagulation Studies: No results for input(s): LABPROT, INR in the last 72 hours.  Imaging: Mr Jeri Cos VZ Contrast  Result Date:  06/24/2017 CLINICAL DATA:  Altered level of consciousness, unexplained. Uncontrolled diabetes with prolonged hypoglycemia. EXAM: MRI HEAD WITHOUT AND WITH CONTRAST TECHNIQUE: Multiplanar, multiecho pulse sequences of the brain and surrounding structures were obtained without and with intravenous contrast. CONTRAST:  61m MULTIHANCE GADOBENATE DIMEGLUMINE 529 MG/ML IV SOLN COMPARISON:  Head CT from 2 days ago and earlier. FINDINGS: Brain: On FLAIR imaging the cortex appears diffusely hyperintense. Questionable restricted diffusion in the cortex on ADC map. On axial T1 weighted imaging sulci are questionably narrower than on more remote head CTs. No focal infarct, hemorrhage, hydrocephalus, or collection. No midline shift. Vascular: Major flow voids are preserved. Skull and upper cervical spine: Negative Sinuses/Orbits: No significant finding. IMPRESSION: No focal or reversible finding. Borderline FLAIR hyperintensity/edema throughout the cerebral cortex, but no visible swelling on the other sequences. This could be related to technique, global injury, or seizure phenomenon. Electronically Signed   By: JMonte FantasiaM.D.   On: 06/24/2017 13:35   Dg Chest Port 1 View  Result Date: 06/26/2017 CLINICAL DATA:  Hypoxia EXAM: PORTABLE CHEST 1 VIEW COMPARISON:  June 25, 2017 FINDINGS: Endotracheal tube tip is 4.7 cm above the carina. Central catheter tip is in the superior vena cava. Nasogastric tube tip and side port are in the stomach. No pneumothorax. There is a right pleural effusion. There is slight bibasilar atelectasis. No frank edema or consolidation. Heart size and pulmonary vascularity are normal. No adenopathy. There is evidence of old rib trauma on each side. IMPRESSION: Tube and catheter positions as described without pneumothorax. Right pleural effusion  with mild bibasilar atelectasis. No consolidation. Stable cardiac silhouette. Electronically Signed   By: Lowella Grip III M.D.   On:  06/26/2017 07:12   Dg Chest Port 1 View  Result Date: 06/25/2017 CLINICAL DATA:  56 year old male with a history of respiratory failure EXAM: PORTABLE CHEST 1 VIEW COMPARISON:  06/23/2017, 06/22/2017 FINDINGS: Cardiomediastinal silhouette unchanged in size and contour. Unchanged position of the endotracheal tube, which terminates approximately 4.7 cm above the carina. Unchanged position of left IJ central venous catheter with the tip appearing to terminate at the superior vena cava. Unchanged gastric tube terminating in the left upper quadrant. Interval development of dense opacity at the right base with maintained visualization of the right heart border, and obscuration of the right hemidiaphragm. No pneumothorax. Aeration maintained on the left. IMPRESSION: Interval development of right lower lobe collapse/ consolidation, potentially secondary to aspiration and/or mucous plugging. Unchanged endotracheal tube, gastric tube, and left IJ central venous catheter. Electronically Signed   By: Corrie Mckusick D.O.   On: 06/25/2017 07:40    Medications:  I have reviewed the patient's current medications. Scheduled: . chlorhexidine gluconate (MEDLINE KIT)  15 mL Mouth Rinse BID  . famotidine  20 mg Per Tube Daily  . feeding supplement (PRO-STAT SUGAR FREE 64)  30 mL Per Tube BID  . feeding supplement (VITAL HIGH PROTEIN)  1,000 mL Per Tube Q24H  . insulin aspart  0-9 Units Subcutaneous Q4H  . lipase/protease/amylase  36,000 Units Oral Q6H  . mouth rinse  15 mL Mouth Rinse 10 times per day  . multivitamin  15 mL Per Tube Daily    Assessment/Plan: No seizure activity noted.  To remain on Keppra.  Will continue to follow with you.    LOS: 4 days   Alexis Goodell, MD Neurology [phone] 06/26/2017  11:02 AM

## 2017-06-26 NOTE — Progress Notes (Signed)
Nutrition Brief Follow-Up Note   Discussed in rounds. Was hypothermic last night and this morning, bairhugger applied. Has OGT currently, NGT will be placed by IR  Plan -------- If patient needs long term nutrition support, recommend G-J tube due to chronic pancreatitis. Patient was having copious diarrhea per CCM, post pyloric tube feeding may help decrease diarrhea and improve tolerance.  Per MD Simonds, increase VHP to 49mL/hr, if patient tolerates, switch to VAF 1.2 at 55. -------  Labs reviewed:  CBGs 150, 134, 141  Medications reviewed and include:  Creon, Insulin, MVI liquid Precedx gtt LR 40k+ at 86mL/hr  [name]. Curtis Heinle, MS, RD LDN Inpatient Clinical Dietitian Pager [phone]

## 2017-06-26 NOTE — Progress Notes (Signed)
Pharmacy Antibiotic Note  [name] is a 56 y.o. male with recnet MRSA port-a-cath infection admitted on 06/22/2017 with pneumonia.  Pharmacy has been consulted for cefepime dosing.  Plan: Continue cefepime 2 g iv q 8 hours.   Height: 5\' 10"  (177.8 cm) Weight: 122 lb 9.2 oz (55.6 kg) IBW/kg (Calculated) : 73  Temp (24hrs), Avg:97.5 F (36.4 C), Min:93.9 F (34.4 C), Max:99.5 F (37.5 C)  Recent Labs  Lab 06/22/17 1334  06/22/17 1815 06/22/17 2129 06/23/17 0133 06/23/17 0444  06/23/17 1804 06/24/17 0505 06/24/17 1930 06/25/17 0415 06/26/17 0322 06/26/17 0510  WBC  --    < > 12.9*  --  11.8* 11.4*  --   --  16.0*  --  14.8*  --  18.6*  CREATININE  --    < > 0.94 0.97 1.03 0.95   < > 0.93 0.79 0.73 0.81 0.65  --   LATICACIDVEN 1.1  --  0.8 1.0  --   --   --   --   --   --   --   --   --    < > = values in this interval not displayed.    Estimated Creatinine Clearance: 80.1 mL/min (by C-G formula based on SCr of 0.65 mg/dL).    Allergies  Allergen Reactions  . Aspirin Itching and Other (See Comments)    Reaction: abdominal pain    Antimicrobials this admission: vancomycin 12/24 >> 12/25, 12/27 >> 12/28 Zosyn 12/24 >> 12/26 Cefepime 12/27 >>  Dose adjustments this admission:   Microbiology results: 12/24 BCx: NGTD 12/24 UCx: 20 K yeast  12/26 Sputum: GNR, rare yeast  12/24 MRSA PCR: negative 12/26 C diff: negative 12/26 GI PCR: negative  Thank you for allowing pharmacy to be a part of this patient's care.  Ulice Dash D 06/26/2017 12:10 PM

## 2017-06-26 NOTE — Progress Notes (Signed)
[facility] NAME: [name]    MR#:  [id number]  DATE OF BIRTH:  [birth year]  SUBJECTIVE:   Hypothermic this am On vent   REVIEW OF SYSTEMS:    Unable to obtain Intubated sedated    Tolerating Diet: tube feeds      DRUG ALLERGIES:   Allergies  Allergen Reactions  . Aspirin Itching and Other (See Comments)    Reaction: abdominal pain    VITALS:  Blood pressure 99/67, pulse (!) 56, temperature 97.8 F (36.6 C), temperature source Oral, resp. rate 14, height 5\' 10"  (1.778 m), weight 55.6 kg (122 lb 9.2 oz), SpO2 99 %.  PHYSICAL EXAMINATION:  Constitutional: Appears ill appearing intubated sedated HENT: Normocephalic. . Neck: Normal ROM. Neck supple. No JVD. No tracheal deviation. CVS: RRR, S1/S2 +, no murmurs, no gallops, no carotid bruit.  Pulmonary: Effort and breath sounds normal, no stridor, rhonchi, wheezes, rales.  Abdominal: Soft. BS +,  no distension, tenderness, rebound or guarding.  Musculoskeletal:  No edema and no tenderness.  Neuro: sedated on vent Skin: Skin is warm and dry. No rash noted. Psychiatric: sedated    LABORATORY PANEL:   CBC Recent Labs  Lab 06/26/17 0510  WBC 18.6*  HGB 8.5*  HCT 25.8*  PLT 58*   ------------------------------------------------------------------------------------------------------------------  Chemistries  Recent Labs  Lab 06/23/17 0444  06/25/17 0415 06/26/17 0322  NA 139   < > 139 138  K 3.3*   < > 3.6 4.0  CL 122*   < > 109 109  CO2 15*   < > 27 26  GLUCOSE 99   < > 162* 146*  BUN 12   < > 11 10  CREATININE 0.95   < > 0.81 0.65  CALCIUM 7.8*   < > 7.8* 7.9*  MG 2.0   < > 1.9  --   AST 35  --   --   --   ALT 28  --   --   --   ALKPHOS 86  --   --   --   BILITOT 0.8  --   --   --    < > = values in this interval not displayed.    ------------------------------------------------------------------------------------------------------------------  Cardiac Enzymes Recent Labs  Lab 06/22/17 1336  TROPONINI <0.03   ------------------------------------------------------------------------------------------------------------------  RADIOLOGY:  Mr Jeri Cos Wo Contrast  Result Date: 06/24/2017 CLINICAL DATA:  Altered level of consciousness, unexplained. Uncontrolled diabetes with prolonged hypoglycemia. EXAM: MRI HEAD WITHOUT AND WITH CONTRAST TECHNIQUE: Multiplanar, multiecho pulse sequences of the brain and surrounding structures were obtained without and with intravenous contrast. CONTRAST:  94mL MULTIHANCE GADOBENATE DIMEGLUMINE 529 MG/ML IV SOLN COMPARISON:  Head CT from 2 days ago and earlier. FINDINGS: Brain: On FLAIR imaging the cortex appears diffusely hyperintense. Questionable restricted diffusion in the cortex on ADC map. On axial T1 weighted imaging sulci are questionably narrower than on more remote head CTs. No focal infarct, hemorrhage, hydrocephalus, or collection. No midline shift. Vascular: Major flow voids are preserved. Skull and upper cervical spine: Negative Sinuses/Orbits: No significant finding. IMPRESSION: No focal or reversible finding. Borderline FLAIR hyperintensity/edema throughout the cerebral cortex, but no visible swelling on the other sequences. This could be related to technique, global injury, or seizure phenomenon. Electronically Signed   By: Monte Fantasia M.D.   On: 06/24/2017 13:35   Dg Chest Port 1 View  Result Date: 06/26/2017 CLINICAL DATA:  Hypoxia EXAM: PORTABLE CHEST 1  VIEW COMPARISON:  June 25, 2017 FINDINGS: Endotracheal tube tip is 4.7 cm above the carina. Central catheter tip is in the superior vena cava. Nasogastric tube tip and side port are in the stomach. No pneumothorax. There is a right pleural effusion. There is slight bibasilar atelectasis. No frank edema or  consolidation. Heart size and pulmonary vascularity are normal. No adenopathy. There is evidence of old rib trauma on each side. IMPRESSION: Tube and catheter positions as described without pneumothorax. Right pleural effusion with mild bibasilar atelectasis. No consolidation. Stable cardiac silhouette. Electronically Signed   By: Lowella Grip III M.D.   On: 06/26/2017 07:12   Dg Chest Port 1 View  Result Date: 06/25/2017 CLINICAL DATA:  56 year old male with a history of respiratory failure EXAM: PORTABLE CHEST 1 VIEW COMPARISON:  06/23/2017, 06/22/2017 FINDINGS: Cardiomediastinal silhouette unchanged in size and contour. Unchanged position of the endotracheal tube, which terminates approximately 4.7 cm above the carina. Unchanged position of left IJ central venous catheter with the tip appearing to terminate at the superior vena cava. Unchanged gastric tube terminating in the left upper quadrant. Interval development of dense opacity at the right base with maintained visualization of the right heart border, and obscuration of the right hemidiaphragm. No pneumothorax. Aeration maintained on the left. IMPRESSION: Interval development of right lower lobe collapse/ consolidation, potentially secondary to aspiration and/or mucous plugging. Unchanged endotracheal tube, gastric tube, and left IJ central venous catheter. Electronically Signed   By: Corrie Mckusick D.O.   On: 06/25/2017 07:40     ASSESSMENT AND PLAN:   56 year old male with recent resected colon cancer who is brought to the emergency room due to altered mental status  1. Acute hypoxic respiratory failure due to coma and right lower lobe HCAP: Continue full vent support as per intensivist  2. RLL HCAP: Continue cefepime  3. Acute encephalopathy, metabolic in nature due to prolonged hypoglycemia Suspect hypoglycemic brain injury MRI shows hypoperfusion EEG shows no seizure-like activity Consultation with neurology appreciated  4.  History colon cancer status post colon resection:  5. Severe protein calorie malnutrition on tube feeds  6. Acute on chronic anemia chronic disease: Hemoglobin stable  7. Type 2 diabetes with severe hypoglycemia which is resolved: Continue sliding scale  CODE STATUS: FULL  TOTAL TIME TAKING CARE OF THIS PATIENT: 20 minutes.     POSSIBLE D/C ??, DEPENDING ON CLINICAL CONDITION.   Woodrow Dulski M.D on 06/26/2017 at 9:16 AM  Between 7am to 6pm - Pager - [phone] After 6pm go to [email or web address] - password [email or web address] [facility]  Office  [phone]  CC: Primary care physician; [name], MD  Note: This dictation was prepared with Dragon dictation along with smaller phrase technology. Any transcriptional errors that result from this process are unintentional.

## 2017-06-26 NOTE — Progress Notes (Signed)
PULMONARY / CRITICAL CARE MEDICINE   Name: [name] MRN: [id number] DOB: 1961-04-11    ADMISSION DATE:  06/22/2017 CONSULTATION DATE:  12/24  REFERRING MD: Margaretmary Eddy  PT PROFILE:   37 M with hx of recently resected colon cancer (04/17/17 at [facility]) and recent MRSA Port-A-Cath infection with bacteremia brought to [facility] ED by EMS for altered cognition and intubated in the ED for same.  Was found to be profoundly hypoglycemic by EMS.  Received dextrose without improvement in mental status.  Also received naloxone without improvement in cognition.   MAJOR EVENTS/TEST RESULTS: 12/24 admission via ED as noted above.  Transfused 1 unit PRBCs in the ED for hemoglobin 7.3.  Bicarbonate infusion initiated for profound hyperchloremic acidosis 12/24 CT head: No acute findings 12/24 CTAP: No acute findings in the abdomen or pelvis.  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 Rec continued HCO3 infusion 12/25 Persistent coma despite no sedatives.  12/25Neurology consultation: Concern for possible seizure. Keppra initiated MRI brain ordered 12/26 Severe diarrhea. TF rate reduced and pancreatic enzymes initiated 12/26 more responsive with some spontaneous movement but not F/C. Fentanyl infusion initiated, then DC's. Intermittent fentanyl/midaz 12/26 MRI brain: Borderline FLAIR hyperintensity/edema throughout the cerebral cortex, but no visible swelling on the other sequences. This could be related to technique, global injury, or seizure phenomenon 12/26 EEG: This is an abnormal electroencephalogram secondary to a slow, unreactive, discontinuous background.  This finding is nonspecific and may be seen in many clinical scenarios.  No epileptiform activity is noted 12/27 increased psychomotor agitation.  Nonpurposeful.  Increased purulent respiratory secretions. 12/28 remains nonpurposeful with intermittent psychomotor agitation requiring  intermittent fentanyl and midazolam  INDWELLING DEVICES:: ETT 12/24 >>  L IJ CVL 12/24 >>   MICRO DATA: Urine 12/24 >> 20k cfu yeast Resp 12/24 >> c/w NOF Blood 12/24 >> NEG C diff PCR 12/26 >> NEG GI panel 12/26 >> NEG  ANTIMICROBIALS:  Pip-tazo 12/24 >> 12/26 Vancomycin 12/24 >> 12/28 Cefepime 12/27 >>    SUBJECTIVE:  Psychomotor agitation.  Nonpurposeful.  Improved purulent respiratory secretions.  VITAL SIGNS: BP 104/72   Pulse (!) 53   Temp 97.8 F (36.6 C) (Oral)   Resp 14   Ht 5\' 10"  (1.778 m)   Wt 55.6 kg (122 lb 9.2 oz)   SpO2 100%   BMI 17.59 kg/m   HEMODYNAMICS:    VENTILATOR SETTINGS: Vent Mode: PRVC FiO2 (%):  [24 %] 24 % Set Rate:  [14 bmp] 14 bmp Vt Set:  [500 mL] 500 mL PEEP:  [5 cmH20] 5 cmH20 Plateau Pressure:  [18 cmH20-22 cmH20] 22 cmH20  INTAKE / OUTPUT: I/O last 3 completed shifts: In: 3115.1 [I.V.:1578.4; NG/GT:456.7; IV Piggyback:1080] Out: 2080 [Urine:980; Stool:1100]  PHYSICAL EXAMINATION: General: intubated, sedated, squirmy, not F/C Neuro: gaze now midline, withdraws all 4 ext, moves all 4 ext spontanrously HEENT: NCAT, sclerae white Cardiovascular: Regular, no M Lungs: few rhonchi Abdomen: Scaphoid, + bowel sounds, no palpable masses Ext: warm, no edema  LABS:  BMET Recent Labs  Lab 06/24/17 1930 06/25/17 0415 06/26/17 0322  NA 140 139 138  K 3.5 3.6 4.0  CL 109 109 109  CO2 27 27 26   BUN 10 11 10   CREATININE 0.73 0.81 0.65  GLUCOSE 109* 162* 146*    Electrolytes Recent Labs  Lab 06/23/17 0444  06/24/17 0505 06/24/17 1930 06/25/17 0415 06/26/17 0322  CALCIUM 7.8*   < > 7.7* 7.7* 7.8* 7.9*  MG 2.0  --  1.8  --  1.9  --   PHOS  --   --  1.7* 2.1* 3.2  --    < > = values in this interval not displayed.    CBC Recent Labs  Lab 06/24/17 0505 06/25/17 0415 06/26/17 0510  WBC 16.0* 14.8* 18.6*  HGB 8.1* 7.9* 8.5*  HCT 24.9* 23.7* 25.8*  PLT 71* 53* 58*    Coag's Recent Labs  Lab  06/22/17 1815  APTT 43*  INR 1.32    Sepsis Markers Recent Labs  Lab 06/22/17 1334 06/22/17 1815 06/22/17 2129 06/23/17 0444 06/24/17 0505  LATICACIDVEN 1.1 0.8 1.0  --   --   PROCALCITON 0.11 <0.10  --  <0.10 0.24    ABG Recent Labs  Lab 06/22/17 1424  PHART 7.23*  PCO2ART 24*  PO2ART 233*    Liver Enzymes Recent Labs  Lab 06/22/17 1336 06/23/17 0444  AST 30 35  ALT 32 28  ALKPHOS 93 86  BILITOT 0.5 0.8  ALBUMIN 2.1* 1.9*    Cardiac Enzymes Recent Labs  Lab 06/22/17 1336  TROPONINI <0.03    Glucose Recent Labs  Lab 06/25/17 1623 06/25/17 1918 06/25/17 2351 06/26/17 0326 06/26/17 0740 06/26/17 1056  GLUCAP 114* 111* 117* 141* 134* 150*    CXR: NSC RLL infiltrate and effusion    ASSESSMENT / PLAN:  PULMONARY A: Acute respiratory failure due to coma RLL HCAP P:   Cont vent support - settings reviewed and/or adjusted Wean in PSV as tolerated Cont vent bundle Daily SBT if/when meets criteria Favor a trial of extubation prior to committing to trach tube  CARDIOVASCULAR A:  Hypotension, resolved Sinus bradycardia, resolved P:  Monitor BP and rhythm Norepi as needed to maintain MAP > 60 mmHg  RENAL A:   AKI, resolved Severe hyperchloremic acidosis, resolved Hypokalemia, resolved Hypernatremia, resolved P:   Monitor BMET intermittently Monitor I/Os Correct electrolytes as indicated IVF's adjusted (rate decreased to 50 cc/hr 12/28)  GASTROINTESTINAL A:   Recent bowel resection for colon cancer Chronic pancreatitis with severe pancreatic insufficiency Severe protein-calorie malnutrition Severe diarrhea - improved with decrease in TF rate Dysphagia due to encephalopathy P:   SUP: Enteral famotidine Cont pancreatic enzymes - dose increased 12/27 Increase TF rate to 20 cc/h 12/28 Advance TF to goal 12/29 if tolerating and diarrhea is not too severe Has rectal tube IR to place NGT in anticipation of extubation trial  soon  HEMATOLOGIC A:   Acute on chronic anemia without overt blood loss Thrombocytopenia  P:  DVT px: SCDs Monitor CBC intermittently Transfuse per usual guidelines   INFECTIOUS A:   RLL HCAP P:   Monitor temp, WBC count Micro and abx as above   ENDOCRINE A:   Type 2 diabetes Severe hypoglycemia, resolved Hyperglycemia, controlled P:   Monitor CBGs  Continue sensitive scale SSI - initiated 12/26  NEUROLOGIC A:   Acute encephalopathy - suspect hypoglycemic brain injury ICU/ventilator associated discomfort P:   RASS goal: 0, -1 Cont PAD protocol - intermittent fentanyl Neurology following    FAMILY: No family @ bedside  CCM time: 40 mins  The above time includes time spent in consultation with patient and/or family members and reviewing care plan on multidisciplinary rounds  Merton Border, MD PCCM service Mobile [phone] Pager [phone]   06/26/2017, 12:04 PM

## 2017-06-26 NOTE — Progress Notes (Signed)
   06/26/17 0000  Vitals  Temp (!) 93.9 F (34.4 C)  Temp Source Oral   Bair Hugger applied

## 2017-06-27 LAB — CULTURE, BLOOD (ROUTINE X 2)
CULTURE: NO GROWTH
Culture: NO GROWTH
Special Requests: ADEQUATE
Special Requests: ADEQUATE

## 2017-06-27 LAB — GLUCOSE, CAPILLARY
GLUCOSE-CAPILLARY: 169 mg/dL — AB (ref 65–99)
Glucose-Capillary: 115 mg/dL — ABNORMAL HIGH (ref 65–99)
Glucose-Capillary: 129 mg/dL — ABNORMAL HIGH (ref 65–99)
Glucose-Capillary: 152 mg/dL — ABNORMAL HIGH (ref 65–99)
Glucose-Capillary: 166 mg/dL — ABNORMAL HIGH (ref 65–99)
Glucose-Capillary: 77 mg/dL (ref 65–99)
Glucose-Capillary: 86 mg/dL (ref 65–99)

## 2017-06-27 LAB — CBC
HEMATOCRIT: 28.8 % — AB (ref 40.0–52.0)
HEMOGLOBIN: 9.1 g/dL — AB (ref 13.0–18.0)
MCH: 29 pg (ref 26.0–34.0)
MCHC: 31.6 g/dL — ABNORMAL LOW (ref 32.0–36.0)
MCV: 91.8 fL (ref 80.0–100.0)
Platelets: 96 10*3/uL — ABNORMAL LOW (ref 150–440)
RBC: 3.13 MIL/uL — ABNORMAL LOW (ref 4.40–5.90)
RDW: 15.5 % — ABNORMAL HIGH (ref 11.5–14.5)
WBC: 23.3 10*3/uL — AB (ref 3.8–10.6)

## 2017-06-27 LAB — COMPREHENSIVE METABOLIC PANEL
ALBUMIN: 1.7 g/dL — AB (ref 3.5–5.0)
ALT: 16 U/L — ABNORMAL LOW (ref 17–63)
ANION GAP: 4 — AB (ref 5–15)
AST: 26 U/L (ref 15–41)
Alkaline Phosphatase: 133 U/L — ABNORMAL HIGH (ref 38–126)
BILIRUBIN TOTAL: 0.6 mg/dL (ref 0.3–1.2)
BUN: 16 mg/dL (ref 6–20)
CHLORIDE: 108 mmol/L (ref 101–111)
CO2: 27 mmol/L (ref 22–32)
Calcium: 8.1 mg/dL — ABNORMAL LOW (ref 8.9–10.3)
Creatinine, Ser: 0.84 mg/dL (ref 0.61–1.24)
GFR calc Af Amer: >60 mL/min (ref >60)
Glucose, Bld: 201 mg/dL — ABNORMAL HIGH (ref 65–99)
POTASSIUM: 4.7 mmol/L (ref 3.5–5.1)
Sodium: 139 mmol/L (ref 135–145)
TOTAL PROTEIN: 5.6 g/dL — AB (ref 6.5–8.1)

## 2017-06-27 MED ORDER — IPRATROPIUM-ALBUTEROL 0.5-2.5 (3) MG/3ML IN SOLN
3.0000 mL | Freq: Four times a day (QID) | RESPIRATORY_TRACT | Status: DC
  Administered 2017-06-27 – 2017-07-02 (×19): 3 mL via RESPIRATORY_TRACT
  Filled 2017-06-27 (×13): qty 3
  Filled 2017-06-27: qty 6
  Filled 2017-06-27 (×6): qty 3

## 2017-06-27 NOTE — Progress Notes (Signed)
Mrs Frady mother of Christohper Dube at bedside, requested to speak with Dr Mortimer Fries. Called Dr Mortimer Fries and he is speaking with her over the phone. He was unable to reach her this am when he attempted to call her. Family states that they have been at a funeral all day.

## 2017-06-27 NOTE — Progress Notes (Signed)
PULMONARY / CRITICAL CARE MEDICINE   Name: [name] MRN: [id number] DOB: 04-Feb-1961    ADMISSION DATE:  06/22/2017 CONSULTATION DATE:  12/24  REFERRING MD: Margaretmary Eddy  PT PROFILE:   6 M with hx of recently resected colon cancer (04/17/17 at [facility]) and recent MRSA Port-A-Cath infection with bacteremia brought to [facility] ED by EMS for altered cognition and intubated in the ED for same.  Was found to be profoundly hypoglycemic by EMS.  Received dextrose without improvement in mental status.  Also received naloxone without improvement in cognition.   MAJOR EVENTS/TEST RESULTS: 12/24 admission via ED as noted above.  Transfused 1 unit PRBCs in the ED for hemoglobin 7.3.  Bicarbonate infusion initiated for profound hyperchloremic acidosis 12/24 CT head: No acute findings 12/24 CTAP: No acute findings in the abdomen or pelvis.  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 Rec continued HCO3 infusion 12/25 Persistent coma despite no sedatives.  12/25Neurology consultation: Concern for possible seizure. Keppra initiated MRI brain ordered 12/26 Severe diarrhea. TF rate reduced and pancreatic enzymes initiated 12/26 more responsive with some spontaneous movement but not F/C. Fentanyl infusion initiated, then DC's. Intermittent fentanyl/midaz 12/26 MRI brain: Borderline FLAIR hyperintensity/edema throughout the cerebral cortex, but no visible swelling on the other sequences. This could be related to technique, global injury, or seizure phenomenon 12/26 EEG: This is an abnormal electroencephalogram secondary to a slow, unreactive, discontinuous background.  This finding is nonspecific and may be seen in many clinical scenarios.  No epileptiform activity is noted 12/27 increased psychomotor agitation.  Nonpurposeful.  Increased purulent respiratory secretions. 12/28 remains nonpurposeful with intermittent psychomotor agitation requiring  intermittent fentanyl and midazolam  INDWELLING DEVICES:: ETT 12/24 >>  L IJ CVL 12/24 >>   MICRO DATA: Urine 12/24 >> 20k cfu yeast Resp 12/24 >> c/w NOF Blood 12/24 >> NEG C diff PCR 12/26 >> NEG GI panel 12/26 >> NEG  ANTIMICROBIALS:  Pip-tazo 12/24 >> 12/26 Vancomycin 12/24 >> 12/28 Cefepime 12/27 >>    SUBJECTIVE:  Psychomotor agitation.  Nonpurposeful.  Improved purulent respiratory secretions. Remains intubated, will wean off precedex and assess neuro status  VITAL SIGNS: BP (!) 88/58   Pulse 62   Temp 97.6 F (36.4 C) (Axillary)   Resp (!) 24   Ht 5\' 10"  (1.778 m)   Wt 129 lb 3 oz (58.6 kg)   SpO2 100%   BMI 18.54 kg/m   HEMODYNAMICS:    VENTILATOR SETTINGS: Vent Mode: PRVC FiO2 (%):  [24 %-30 %] 30 % Set Rate:  [14 bmp] 14 bmp Vt Set:  [500 mL] 500 mL PEEP:  [5 cmH20] 5 cmH20 Plateau Pressure:  [9 cmH20-17 cmH20] 17 cmH20  INTAKE / OUTPUT: I/O last 3 completed shifts: In: 3879.6 [I.V.:2499.6; NG/GT:710; IV Piggyback:670] Out: 1880 [Urine:1860; Stool:20]  PHYSICAL EXAMINATION: General: intubated, sedated, squirmy, not F/C Neuro: gaze now midline, withdraws all 4 ext, moves all 4 ext spontanrously HEENT: NCAT, sclerae white Cardiovascular: Regular, no M Lungs: few rhonchi Abdomen: Scaphoid, + bowel sounds, no palpable masses Ext: warm, no edema  LABS:  BMET Recent Labs  Lab 06/25/17 0415 06/26/17 0322 06/27/17 0417  NA 139 138 139  K 3.6 4.0 4.7  CL 109 109 108  CO2 27 26 27   BUN 11 10 16   CREATININE 0.81 0.65 0.84  GLUCOSE 162* 146* 201*    Electrolytes Recent Labs  Lab 06/23/17 0444  06/24/17 0505 06/24/17 1930 06/25/17 0415 06/26/17  7564 06/27/17 0417  CALCIUM 7.8*   < > 7.7* 7.7* 7.8* 7.9* 8.1*  MG 2.0  --  1.8  --  1.9  --   --   PHOS  --   --  1.7* 2.1* 3.2  --   --    < > = values in this interval not displayed.    CBC Recent Labs  Lab 06/25/17 0415 06/26/17 0510 06/27/17 0417  WBC 14.8* 18.6* 23.3*  HGB  7.9* 8.5* 9.1*  HCT 23.7* 25.8* 28.8*  PLT 53* 58* 96*    Coag's Recent Labs  Lab 06/22/17 1815  APTT 43*  INR 1.32    Sepsis Markers Recent Labs  Lab 06/22/17 1334 06/22/17 1815 06/22/17 2129 06/23/17 0444 06/24/17 0505  LATICACIDVEN 1.1 0.8 1.0  --   --   PROCALCITON 0.11 <0.10  --  <0.10 0.24    ABG Recent Labs  Lab 06/22/17 1424  PHART 7.23*  PCO2ART 24*  PO2ART 233*    Liver Enzymes Recent Labs  Lab 06/22/17 1336 06/23/17 0444 06/27/17 0417  AST 30 35 26  ALT 32 28 16*  ALKPHOS 93 86 133*  BILITOT 0.5 0.8 0.6  ALBUMIN 2.1* 1.9* 1.7*    Cardiac Enzymes Recent Labs  Lab 06/22/17 1336  TROPONINI <0.03    Glucose Recent Labs  Lab 06/26/17 0740 06/26/17 1056 06/26/17 1552 06/26/17 2007 06/27/17 0036 06/27/17 0405  GLUCAP 134* 150* 153* 88 152* 169*    CXR: NSC RLL infiltrate and effusion  ASSESSMENT / PLAN:  PULMONARY A: Acute respiratory failure due to coma RLL HCAP P:   Cont vent support - settings reviewed and/or adjusted Wean in PSV as tolerated Cont vent bundle Daily SBT if/when meets criteria Favor a trial of extubation prior to committing to trach tube  CARDIOVASCULAR A:  Hypotension, resolved Sinus bradycardia, resolved P:  Monitor BP and rhythm Norepi as needed to maintain MAP > 60 mmHg  RENAL A:   AKI, resolved Severe hyperchloremic acidosis, resolved Hypokalemia, resolved Hypernatremia, resolved P:   Monitor BMET intermittently Monitor I/Os Correct electrolytes as indicated IVF's adjusted (rate decreased to 50 cc/hr 12/28)  GASTROINTESTINAL A:   Recent bowel resection for colon cancer Chronic pancreatitis with severe pancreatic insufficiency Severe protein-calorie malnutrition Severe diarrhea - improved with decrease in TF rate Dysphagia due to encephalopathy P:   SUP: Enteral famotidine Cont pancreatic enzymes - dose increased 12/27 Increase TF rate to 20 cc/h 12/28 Advance TF to goal 12/29 if  tolerating and diarrhea is not too severe Has rectal tube NGT placed  HEMATOLOGIC A:   Acute on chronic anemia without overt blood loss Thrombocytopenia  P:  DVT px: SCDs Monitor CBC intermittently Transfuse per usual guidelines   INFECTIOUS A:   RLL HCAP P:   Monitor temp, WBC count Micro and abx as above   ENDOCRINE A:   Type 2 diabetes Severe hypoglycemia, resolved Hyperglycemia, controlled P:   Monitor CBGs  Continue sensitive scale SSI - initiated 12/26  NEUROLOGIC A:   Acute encephalopathy - suspect hypoglycemic brain injury ICU/ventilator associated discomfort P:   RASS goal: 0, -1 Cont PAD protocol - intermittent fentanyl Neurology following   Will need to update family  Critical Care Time devoted to patient care services described in this note is 45 minutes.   Overall, patient is critically ill, prognosis is guarded.  Patient with Multiorgan failure and at high risk for cardiac arrest and death.    Corrin Parker, M.D.  [place]  Medical Director [place] Director [facility] Cardio-Pulmonary Department

## 2017-06-27 NOTE — Progress Notes (Signed)
[name] and family at bedside. Patients mother states that patient was extubated yesterday "tube that is in his mouth" was taken out yesterday. She does not understand "why" its back in his mouth. "He was breathing so well". I explained to her that there was no report or documentation of an extubation. As well as respitory stating that they did not have a report of any extubation or documentation of it as well. Called pastoral care.

## 2017-06-27 NOTE — Progress Notes (Signed)
Nurse paged chaplain for pt in [place] whose mother was agitated. [place] met brother, mother and another family member at bedside. Mother states that pt was extubated yesterday and was breathing well. She states that she touched pts beards yesterday and was surprised to see him intubated today. RN explained to mother and brother that there was not record to show pt was extubated. Mother was still skeptic about answer she received from the RN. However, she was calm and talk to [facility] about her husband who died of cancer; her son who passed away of cancer a year ago, pt.'s girlfriend whom she said should not be allowed to visit pt, and her son who was present with her in the Rm, whom she said was blind and disable. Mother was emotional and tearful as she talked about her family. She asked for prayer to cope, which [place] offered with a ministry of presence.     06/27/17 2000  Clinical Encounter Type  Visited With Patient;Patient and family together;Health care provider  Visit Type Initial;Spiritual support;Other (Comment)  Referral From Nurse  Consult/Referral To Chaplain  Spiritual Encounters  Spiritual Needs Prayer;Other (Comment)  Stress Factors  Patient Stress Factors Health changes  Family Stress Factors Health changes

## 2017-06-27 NOTE — Progress Notes (Signed)
[place] [facility] [place] NAME: [name]    MR#:  [id number]  DATE OF BIRTH:  February 26, 1961  SUBJECTIVE:   Still on the vent    REVIEW OF SYSTEMS:    Unable to obtain Intubated   Tolerating Diet: tube feeds      DRUG ALLERGIES:   Allergies  Allergen Reactions  . Aspirin Itching and Other (See Comments)    Reaction: abdominal pain    VITALS:  Blood pressure (!) 89/61, pulse (!) 59, temperature 99 F (37.2 C), resp. rate (!) 21, height 5\' 10"  (1.778 m), weight 58.6 kg (129 lb 3 oz), SpO2 99 %.  PHYSICAL EXAMINATION:  Constitutional: Appears ill appearing intubated sedated HENT: Normocephalic. . Neck: Normal ROM. Neck supple. No JVD. No tracheal deviation. CVS: RRR, S1/S2 +, no murmurs, no gallops, no carotid bruit.  Pulmonary: Effort and breath sounds normal, no stridor, rhonchi, wheezes, rales.  Abdominal: Soft. BS +,  no distension, tenderness, rebound or guarding.  Musculoskeletal:  No edema and no tenderness.  Neuro: sedated on vent Skin: Skin is warm and dry. No rash noted. Psychiatric: sedated    LABORATORY PANEL:   CBC Recent Labs  Lab 06/27/17 0417  WBC 23.3*  HGB 9.1*  HCT 28.8*  PLT 96*   ------------------------------------------------------------------------------------------------------------------  Chemistries  Recent Labs  Lab 06/25/17 0415  06/27/17 0417  NA 139   < > 139  K 3.6   < > 4.7  CL 109   < > 108  CO2 27   < > 27  GLUCOSE 162*   < > 201*  BUN 11   < > 16  CREATININE 0.81   < > 0.84  CALCIUM 7.8*   < > 8.1*  MG 1.9  --   --   AST  --   --  26  ALT  --   --  16*  ALKPHOS  --   --  133*  BILITOT  --   --  0.6   < > = values in this interval not displayed.   ------------------------------------------------------------------------------------------------------------------  Cardiac Enzymes Recent Labs  Lab 06/22/17 1336  TROPONINI <0.03    ------------------------------------------------------------------------------------------------------------------  RADIOLOGY:  Dg Chest Port 1 View  Result Date: 06/26/2017 CLINICAL DATA:  Hypoxia EXAM: PORTABLE CHEST 1 VIEW COMPARISON:  June 25, 2017 FINDINGS: Endotracheal tube tip is 4.7 cm above the carina. Central catheter tip is in the superior vena cava. Nasogastric tube tip and side port are in the stomach. No pneumothorax. There is a right pleural effusion. There is slight bibasilar atelectasis. No frank edema or consolidation. Heart size and pulmonary vascularity are normal. No adenopathy. There is evidence of old rib trauma on each side. IMPRESSION: Tube and catheter positions as described without pneumothorax. Right pleural effusion with mild bibasilar atelectasis. No consolidation. Stable cardiac silhouette. Electronically Signed   By: Lowella Grip III M.D.   On: 06/26/2017 07:12   Dg Addison Bailey G Tube Plc W/fl W/rad  Result Date: 06/26/2017 CLINICAL DATA:  NG tube needed. EXAM: NASO G TUBE PLACEMENT WITH FL AND WITH RAD CONTRAST:  None. FLUOROSCOPY TIME:  Fluoroscopy Time:  3 minutes 6 seconds Radiation Exposure Index (if provided by the fluoroscopic device): 23.9 mGy Number of Acquired Spot Images: 1 COMPARISON:  06/23/2017 . FINDINGS: Nursing supervised patient presented for NG tube placement. Following lubrication of a nasogastric tube and using fluoroscopic guidance NG tube was placed with tip placed to the distal stomach. There  no complications. NG tube was secured to the nose by nursing. IMPRESSION: Successful fluoroscopically directed NG tube placement . Electronically Signed   By: Marcello Moores  Register   On: 06/26/2017 14:21     ASSESSMENT AND PLAN:   56 year old male with recent resected colon cancer who is brought to the emergency room due to altered mental status  1. Acute hypoxic respiratory failure due to hypoglycemic coma and right lower lobe HCAP: Continue full vent  support as per intensivist  2. RLL HCAP: Continue cefepime  3. Acute encephalopathy, metabolic in nature due to prolonged hypoglycemia Suspect hypoglycemic brain injury MRI of the brain reviewed and shows prominence of the cerebral cortex on flair.  Differential is large and in this patient may be secondary to hypoperfusion, hypoglycemia or seizure activity.  EEG slow but without evidence of seizure activity  Continue empiric Keppra  Consultation with neurology appreciated Patient is not to fill criteria for brain death and recommendations are to continue current management to see if patient will improve.  4. History colon cancer status post colon resection:  5. Severe protein calorie malnutrition on tube feeds  6. Acute on chronic anemia chronic disease: Hemoglobin stable  7. Type 2 diabetes with severe hypoglycemia which is resolved: Continue sliding scale  CODE STATUS: FULL  TOTAL TIME TAKING CARE OF THIS PATIENT: 20 minutes.     POSSIBLE D/C ??, DEPENDING ON CLINICAL CONDITION.   Liley Rake M.D on 06/27/2017 at 8:52 AM  Between 7am to 6pm - Pager - [phone] After 6pm go to [email or web address] - password [email or web address] [facility]  Office  [phone]  CC: Primary care physician; [name], MD  Note: This dictation was prepared with Dragon dictation along with smaller phrase technology. Any transcriptional errors that result from this process are unintentional.

## 2017-06-27 NOTE — Progress Notes (Signed)
Spoke with Dr Mortimer Fries. Patient is becoming agitated. Lifting his torso up and lifting arms up towards his et tube. Sister at bedside. Patient unable to follow simple commands. Per Dr Mortimer Fries restart precedex.

## 2017-06-27 NOTE — Progress Notes (Signed)
Spoke with Dr Mortimer Fries regarding patient unable to follow simple commands. Patient has an downward gaze bilateral when assessing pupils.  Orders to stop precedex. Patient does not have a cough/gag reflex.

## 2017-06-28 DIAGNOSIS — L8990 Pressure ulcer of unspecified site, unspecified stage: Secondary | ICD-10-CM

## 2017-06-28 DIAGNOSIS — R400 Somnolence: Secondary | ICD-10-CM

## 2017-06-28 LAB — GLUCOSE, CAPILLARY
GLUCOSE-CAPILLARY: 148 mg/dL — AB (ref 65–99)
Glucose-Capillary: 166 mg/dL — ABNORMAL HIGH (ref 65–99)
Glucose-Capillary: 172 mg/dL — ABNORMAL HIGH (ref 65–99)
Glucose-Capillary: 135 mg/dL — ABNORMAL HIGH (ref 65–99)
Glucose-Capillary: 81 mg/dL (ref 65–99)

## 2017-06-28 LAB — CBC
HCT: 25.5 % — ABNORMAL LOW (ref 40.0–52.0)
HEMOGLOBIN: 8.3 g/dL — AB (ref 13.0–18.0)
MCH: 29.7 pg (ref 26.0–34.0)
MCHC: 32.7 g/dL (ref 32.0–36.0)
MCV: 91 fL (ref 80.0–100.0)
PLATELETS: 110 10*3/uL — AB (ref 150–440)
RBC: 2.8 MIL/uL — ABNORMAL LOW (ref 4.40–5.90)
RDW: 15.1 % — AB (ref 11.5–14.5)
WBC: 20.1 10*3/uL — ABNORMAL HIGH (ref 3.8–10.6)

## 2017-06-28 LAB — PHOSPHORUS: PHOSPHORUS: 4.6 mg/dL (ref 2.5–4.6)

## 2017-06-28 LAB — MAGNESIUM: MAGNESIUM: 1.8 mg/dL (ref 1.7–2.4)

## 2017-06-28 MED ORDER — DEXTROSE 50 % IV SOLN
INTRAVENOUS | Status: AC
  Filled 2017-06-28: qty 50

## 2017-06-28 MED ORDER — MORPHINE SULFATE (PF) 2 MG/ML IV SOLN
INTRAVENOUS | Status: AC
  Filled 2017-06-28: qty 1

## 2017-06-28 MED ORDER — CHLORHEXIDINE GLUCONATE 0.12 % MT SOLN
OROMUCOSAL | Status: AC
  Administered 2017-06-28: 15 mL
  Filled 2017-06-28: qty 15

## 2017-06-28 MED ORDER — MORPHINE SULFATE (PF) 2 MG/ML IV SOLN
2.0000 mg | Freq: Once | INTRAVENOUS | Status: AC
  Administered 2017-06-28: 2 mg via INTRAVENOUS

## 2017-06-28 MED ORDER — DEXMEDETOMIDINE HCL IN NACL 400 MCG/100ML IV SOLN
0.4000 ug/kg/h | INTRAVENOUS | Status: DC
  Administered 2017-06-28: 0.8 ug/kg/h via INTRAVENOUS
  Administered 2017-06-29: 0.4 ug/kg/h via INTRAVENOUS

## 2017-06-28 MED ORDER — MORPHINE SULFATE (PF) 2 MG/ML IV SOLN
INTRAVENOUS | Status: AC
  Administered 2017-06-28: 2 mg via INTRAMUSCULAR
  Filled 2017-06-28: qty 1

## 2017-06-28 MED ORDER — DEXTROSE 50 % IV SOLN
1.0000 | Freq: Once | INTRAVENOUS | Status: AC
  Administered 2017-06-28: 50 mL via INTRAVENOUS

## 2017-06-28 MED ORDER — MORPHINE SULFATE (PF) 2 MG/ML IV SOLN
2.0000 mg | INTRAVENOUS | Status: DC | PRN
  Administered 2017-06-28 – 2017-06-29 (×9): 2 mg via INTRAVENOUS
  Filled 2017-06-28 (×8): qty 1

## 2017-06-28 NOTE — Progress Notes (Signed)
[place] [facility] [place] NAME: [name]    MR#:  [id number]  DATE OF BIRTH:  [birth year]  SUBJECTIVE:   Still on the vent   Precedex is being weaned in attempt to extubate today  REVIEW OF SYSTEMS:    Unable to obtain Intubated   Tolerating Diet: tube feeds      DRUG ALLERGIES:   Allergies  Allergen Reactions  . Aspirin Itching and Other (See Comments)    Reaction: abdominal pain    VITALS:  Blood pressure 104/69, pulse 61, temperature 97.9 F (36.6 C), resp. rate (!) 23, height 5\' 10"  (1.778 m), weight 59.1 kg (130 lb 4.7 oz), SpO2 98 %.  PHYSICAL EXAMINATION:  Constitutional: Appears ill appearing intubated sedated HENT: Normocephalic. . Neck: Normal ROM. Neck supple. No JVD. No tracheal deviation. CVS: RRR, S1/S2 +, no murmurs, no gallops, no carotid bruit.  Pulmonary: Effort and breath sounds normal, no stridor, rhonchi, wheezes, rales.  Abdominal: Soft. BS +,  no distension, tenderness, rebound or guarding.  Musculoskeletal:  No edema and no tenderness.  Neuro: sedated on vent Skin: Skin is warm and dry. No rash noted. Psychiatric: sedated    LABORATORY PANEL:   CBC Recent Labs  Lab 06/28/17 0526  WBC 20.1*  HGB 8.3*  HCT 25.5*  PLT 110*   ------------------------------------------------------------------------------------------------------------------  Chemistries  Recent Labs  Lab 06/27/17 0417 06/28/17 0526  NA 139  --   K 4.7  --   CL 108  --   CO2 27  --   GLUCOSE 201*  --   BUN 16  --   CREATININE 0.84  --   CALCIUM 8.1*  --   MG  --  1.8  AST 26  --   ALT 16*  --   ALKPHOS 133*  --   BILITOT 0.6  --    ------------------------------------------------------------------------------------------------------------------  Cardiac Enzymes Recent Labs  Lab 06/22/17 1336  TROPONINI <0.03    ------------------------------------------------------------------------------------------------------------------  RADIOLOGY:  Dg Naso G Tube Plc W/fl W/rad  Result Date: 06/26/2017 CLINICAL DATA:  NG tube needed. EXAM: NASO G TUBE PLACEMENT WITH FL AND WITH RAD CONTRAST:  None. FLUOROSCOPY TIME:  Fluoroscopy Time:  3 minutes 6 seconds Radiation Exposure Index (if provided by the fluoroscopic device): 23.9 mGy Number of Acquired Spot Images: 1 COMPARISON:  06/23/2017 . FINDINGS: Nursing supervised patient presented for NG tube placement. Following lubrication of a nasogastric tube and using fluoroscopic guidance NG tube was placed with tip placed to the distal stomach. There no complications. NG tube was secured to the nose by nursing. IMPRESSION: Successful fluoroscopically directed NG tube placement . Electronically Signed   By: Marcello Moores  Register   On: 06/26/2017 14:21     ASSESSMENT AND PLAN:   56 year old male with recent resected colon cancer who is brought to the emergency room due to altered mental status  1. Acute hypoxic respiratory failure due to hypoglycemic coma and right lower lobe HCAP: Try to wean off of ventilator today as per my conversation with intensivist  2. RLL HCAP: Continue cefepime  3. Acute encephalopathy, metabolic in nature due to prolonged hypoglycemia Suspect hypoglycemic brain injury MRI of the brain reviewed and shows prominence of the cerebral cortex on flair.  Differential is large and in this patient may be secondary to hypoperfusion, hypoglycemia or seizure activity.  EEG slow but without evidence of seizure activity  Continue empiric Keppra  Consultation with neurology appreciated   4. History colon  cancer status post colon resection:  5. Severe protein calorie malnutrition on tube feeds  6. Acute on chronic anemia chronic disease: Hemoglobin stable  7. Type 2 diabetes with severe hypoglycemia which is resolved: Continue sliding scale  CODE  STATUS: FULL  TOTAL TIME TAKING CARE OF THIS PATIENT: 21 minutes.   Discussed with Dr.kasa  POSSIBLE D/C ??, DEPENDING ON CLINICAL CONDITION.   Natanael Saladin M.D on 06/28/2017 at 8:45 AM  Between 7am to 6pm - Pager - [phone] After 6pm go to [email or web address] - password [email or web address] [facility]  Office  [phone]  CC: Primary care physician; [name], MD  Note: This dictation was prepared with Dragon dictation along with smaller phrase technology. Any transcriptional errors that result from this process are unintentional.

## 2017-06-28 NOTE — Progress Notes (Signed)
Increased Precedex to max dose 1.2/kg.  Pt remains restless.  Family at [facility].  Mitts on for safety, but NG tube has slipped from original markings.  Repositioned, auscultated for placement, and re-taped.  Using for meds only.  Feeds remain on hold.  Bubba Camp, RN

## 2017-06-28 NOTE — Progress Notes (Signed)
Post extubation, pt on RA, sats high 90's.  Cont not following any commands. Not trying to pull on lines, but no purposeful movements either.  Active arms and legs and moving head side to side.  Repositioned consistently.  Mitts remain on to protect NG tube.  Bubba Camp, RN

## 2017-06-28 NOTE — Progress Notes (Signed)
Pt extubated without complications to roomair, sats 98%, respiratory rate 20/min, no stridor noted, will continue to monitor

## 2017-06-28 NOTE — Progress Notes (Signed)
PULMONARY / CRITICAL CARE MEDICINE   Name: [name] MRN: [id number] DOB: 1960/07/22    ADMISSION DATE:  06/22/2017 CONSULTATION DATE:  12/24  REFERRING MD: Margaretmary Eddy  PT PROFILE:   71 M with hx of recently resected colon cancer (04/17/17 at [facility]) and recent MRSA Port-A-Cath infection with bacteremia brought to [facility] ED by EMS for altered cognition and intubated in the ED for same.  Was found to be profoundly hypoglycemic by EMS.  Received dextrose without improvement in mental status.  Also received naloxone without improvement in cognition.   MAJOR EVENTS/TEST RESULTS: 12/24 admission via ED as noted above.  Transfused 1 unit PRBCs in the ED for hemoglobin 7.3.  Bicarbonate infusion initiated for profound hyperchloremic acidosis 12/24 CT head: No acute findings 12/24 CTAP: No acute findings in the abdomen or pelvis.  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 Rec continued HCO3 infusion 12/25 Persistent coma despite no sedatives.  12/25Neurology consultation: Concern for possible seizure. Keppra initiated MRI brain ordered 12/26 Severe diarrhea. TF rate reduced and pancreatic enzymes initiated 12/26 more responsive with some spontaneous movement but not F/C. Fentanyl infusion initiated, then DC's. Intermittent fentanyl/midaz 12/26 MRI brain: Borderline FLAIR hyperintensity/edema throughout the cerebral cortex, but no visible swelling on the other sequences. This could be related to technique, global injury, or seizure phenomenon 12/26 EEG: This is an abnormal electroencephalogram secondary to a slow, unreactive, discontinuous background.  This finding is nonspecific and may be seen in many clinical scenarios.  No epileptiform activity is noted 12/27 increased psychomotor agitation.  Nonpurposeful.  Increased purulent respiratory secretions. 12/28 remains nonpurposeful with intermittent psychomotor agitation requiring  intermittent fentanyl and midazolam  INDWELLING DEVICES:: ETT 12/24 >>  L IJ CVL 12/24 >>   MICRO DATA: Urine 12/24 >> 20k cfu yeast Resp 12/24 >> c/w NOF Blood 12/24 >> NEG C diff PCR 12/26 >> NEG GI panel 12/26 >> NEG  ANTIMICROBIALS:  Pip-tazo 12/24 >> 12/26 Vancomycin 12/24 >> 12/28 Cefepime 12/27 >>    SUBJECTIVE:  Psychomotor agitation.  Nonpurposeful.  Improved purulent respiratory secretions. Remains intubated, will wean off precedex and assess neuro status Plan for trial of extubation  VITAL SIGNS: BP 104/69   Pulse 61   Temp 97.8 F (36.6 C) (Oral)   Resp (!) 23   Ht 5\' 10"  (1.778 m)   Wt 130 lb 4.7 oz (59.1 kg)   SpO2 97%   BMI 18.69 kg/m   HEMODYNAMICS:    VENTILATOR SETTINGS: Vent Mode: PRVC FiO2 (%):  [30 %] 30 % Set Rate:  [14 bmp] 14 bmp Vt Set:  [500 mL] 500 mL PEEP:  [5 cmH20] 5 cmH20 Plateau Pressure:  [15 cmH20-16 cmH20] 15 cmH20  INTAKE / OUTPUT: I/O last 3 completed shifts: In: 3242.6 [I.V.:1932.6; NG/GT:680; IV Piggyback:630] Out: 3070 [Urine:3050; Stool:20]  PHYSICAL EXAMINATION: General: intubated, sedated, squirmy, not F/C Neuro: gaze now midline, withdraws all 4 ext, moves all 4 ext spontanrously HEENT: NCAT, sclerae white Cardiovascular: Regular, no M Lungs: few rhonchi Abdomen: Scaphoid, + bowel sounds, no palpable masses Ext: warm, no edema  LABS:  BMET Recent Labs  Lab 06/25/17 0415 06/26/17 0322 06/27/17 0417  NA 139 138 139  K 3.6 4.0 4.7  CL 109 109 108  CO2 27 26 27   BUN 11 10 16   CREATININE 0.81 0.65 0.84  GLUCOSE 162* 146* 201*    Electrolytes Recent Labs  Lab 06/24/17 0505 06/24/17 1930 06/25/17 0415 06/26/17  4098 06/27/17 0417 06/28/17 0526  CALCIUM 7.7* 7.7* 7.8* 7.9* 8.1*  --   MG 1.8  --  1.9  --   --  1.8  PHOS 1.7* 2.1* 3.2  --   --  4.6    CBC Recent Labs  Lab 06/26/17 0510 06/27/17 0417 06/28/17 0526  WBC 18.6* 23.3* 20.1*  HGB 8.5* 9.1* 8.3*  HCT 25.8* 28.8* 25.5*  PLT  58* 96* 110*    Coag's Recent Labs  Lab 06/22/17 1815  APTT 43*  INR 1.32    Sepsis Markers Recent Labs  Lab 06/22/17 1334 06/22/17 1815 06/22/17 2129 06/23/17 0444 06/24/17 0505  LATICACIDVEN 1.1 0.8 1.0  --   --   PROCALCITON 0.11 <0.10  --  <0.10 0.24    ABG Recent Labs  Lab 06/22/17 1424  PHART 7.23*  PCO2ART 24*  PO2ART 233*    Liver Enzymes Recent Labs  Lab 06/22/17 1336 06/23/17 0444 06/27/17 0417  AST 30 35 26  ALT 32 28 16*  ALKPHOS 93 86 133*  BILITOT 0.5 0.8 0.6  ALBUMIN 2.1* 1.9* 1.7*    Cardiac Enzymes Recent Labs  Lab 06/22/17 1336  TROPONINI <0.03    Glucose Recent Labs  Lab 06/27/17 1153 06/27/17 1617 06/27/17 2111 06/27/17 2349 06/28/17 0410 06/28/17 0734  GLUCAP 77 86 115* 166* 166* 172*    CXR: NSC RLL infiltrate and effusion  ASSESSMENT / PLAN:  PULMONARY A: Acute respiratory failure due to coma RLL HCAP P:   Cont vent support - settings reviewed and/or adjusted Wean in PSV as tolerated Cont vent bundle Daily SBT if/when meets criteria Favor a trial of extubation prior to committing to trach tube  CARDIOVASCULAR A:  Hypotension, resolved Sinus bradycardia, resolved P:  Monitor BP and rhythm Norepi as needed to maintain MAP > 60 mmHg  RENAL A:   AKI, resolved Severe hyperchloremic acidosis, resolved Hypokalemia, resolved Hypernatremia, resolved P:   Monitor BMET intermittently Monitor I/Os Correct electrolytes as indicated IVF's adjusted (rate decreased to 50 cc/hr 12/28)  GASTROINTESTINAL A:   Recent bowel resection for colon cancer Chronic pancreatitis with severe pancreatic insufficiency Severe protein-calorie malnutrition Severe diarrhea - improved with decrease in TF rate Dysphagia due to encephalopathy P:   SUP: Enteral famotidine Cont pancreatic enzymes - dose increased 12/27 Hold TF's for possible trial of extubation Has rectal tube NGT placed  HEMATOLOGIC A:   Acute on  chronic anemia without overt blood loss Thrombocytopenia  P:  DVT px: SCDs Monitor CBC intermittently Transfuse per usual guidelines   INFECTIOUS A:   RLL HCAP P:   Monitor temp, WBC count Micro and abx as above   ENDOCRINE A:   Type 2 diabetes Severe hypoglycemia, resolved Hyperglycemia, controlled P:   Monitor CBGs  Continue sensitive scale SSI - initiated 12/26  NEUROLOGIC A:   Acute encephalopathy - suspect hypoglycemic brain injury ICU/ventilator associated discomfort P:   RASS goal: 0, -1 Cont PAD protocol - intermittent fentanyl Neurology following   Will need to update family  Critical Care Time devoted to patient care services described in this note is 38 minutes.   Overall, patient is critically ill, prognosis is guarded.  Patient with Multiorgan failure and at high risk for cardiac arrest and death.    Corrin Parker, M.D.  [facility]  Medical Director [place] Director [facility] Cardio-Pulmonary Department

## 2017-06-28 NOTE — Progress Notes (Signed)
Pt relaxed, no restlessness noted.  Order obtained to cont morphine 2mg  q.hour if needed since that appears to be working.  Pt is having periods of bradycardia staying in the high 40's.  Precedex turned off.  Family updated.  Bubba Camp, RN

## 2017-06-28 NOTE — Progress Notes (Signed)
At start of shift unable to obtain temperature orally or axillary. Rectal temp obtained at 34 degrees C Bair hugger applied on high temperature currently 34.6 degrees C.

## 2017-06-29 ENCOUNTER — Inpatient Hospital Stay: Payer: Medicaid Other

## 2017-06-29 LAB — GLUCOSE, CAPILLARY
GLUCOSE-CAPILLARY: 108 mg/dL — AB (ref 65–99)
GLUCOSE-CAPILLARY: 124 mg/dL — AB (ref 65–99)
GLUCOSE-CAPILLARY: 205 mg/dL — AB (ref 65–99)
Glucose-Capillary: 73 mg/dL (ref 65–99)
Glucose-Capillary: 115 mg/dL — ABNORMAL HIGH (ref 65–99)
Glucose-Capillary: 207 mg/dL — ABNORMAL HIGH (ref 65–99)

## 2017-06-29 LAB — BLOOD GAS, ARTERIAL
ACID-BASE EXCESS: 5 mmol/L — AB (ref 0.0–2.0)
Bicarbonate: 29.1 mmol/L — ABNORMAL HIGH (ref 20.0–28.0)
FIO2: 1
O2 Saturation: 96.5 %
PCO2 ART: 40 mmHg (ref 32.0–48.0)
PH ART: 7.47 — AB (ref 7.350–7.450)
Patient temperature: 37
pO2, Arterial: 80 mmHg — ABNORMAL LOW (ref 83.0–108.0)

## 2017-06-29 LAB — PROCALCITONIN: PROCALCITONIN: 4.07 ng/mL

## 2017-06-29 LAB — BASIC METABOLIC PANEL
ANION GAP: 6 (ref 5–15)
BUN: 12 mg/dL (ref 6–20)
CALCIUM: 8.2 mg/dL — AB (ref 8.9–10.3)
CO2: 28 mmol/L (ref 22–32)
Chloride: 104 mmol/L (ref 101–111)
Creatinine, Ser: 0.75 mg/dL (ref 0.61–1.24)
GFR calc Af Amer: >60 mL/min (ref >60)
GFR calc non Af Amer: >60 mL/min (ref >60)
GLUCOSE: 139 mg/dL — AB (ref 65–99)
POTASSIUM: 3.7 mmol/L (ref 3.5–5.1)
Sodium: 138 mmol/L (ref 135–145)

## 2017-06-29 LAB — CBC
HEMATOCRIT: 23.2 % — AB (ref 40.0–52.0)
Hemoglobin: 7.7 g/dL — ABNORMAL LOW (ref 13.0–18.0)
MCH: 30 pg (ref 26.0–34.0)
MCHC: 33.1 g/dL (ref 32.0–36.0)
MCV: 90.7 fL (ref 80.0–100.0)
PLATELETS: 133 10*3/uL — AB (ref 150–440)
RBC: 2.56 MIL/uL — ABNORMAL LOW (ref 4.40–5.90)
RDW: 15 % — AB (ref 11.5–14.5)
WBC: 12 10*3/uL — AB (ref 3.8–10.6)

## 2017-06-29 LAB — PHOSPHORUS: Phosphorus: 4 mg/dL (ref 2.5–4.6)

## 2017-06-29 LAB — MAGNESIUM: Magnesium: 1.6 mg/dL — ABNORMAL LOW (ref 1.7–2.4)

## 2017-06-29 MED ORDER — QUETIAPINE FUMARATE 25 MG PO TABS
25.0000 mg | ORAL_TABLET | Freq: Once | ORAL | Status: AC
  Administered 2017-06-29: 25 mg
  Filled 2017-06-29: qty 1

## 2017-06-29 MED ORDER — NOREPINEPHRINE BITARTRATE 1 MG/ML IV SOLN
0.0000 ug/min | INTRAVENOUS | Status: DC
  Administered 2017-06-29: 2 ug/min via INTRAVENOUS
  Administered 2017-07-01: 6 ug/min via INTRAVENOUS
  Administered 2017-07-02: 7.5 ug/min via INTRAVENOUS
  Filled 2017-06-29 (×5): qty 16

## 2017-06-29 MED ORDER — MIDAZOLAM HCL 2 MG/2ML IJ SOLN
INTRAMUSCULAR | Status: AC
  Filled 2017-06-29: qty 4

## 2017-06-29 MED ORDER — QUETIAPINE FUMARATE 25 MG PO TABS: 25.0000 mg | ORAL_TABLET | Freq: Every day | ORAL | Status: DC

## 2017-06-29 MED ORDER — FENTANYL 12 MCG/HR TD PT72
12.5000 ug | MEDICATED_PATCH | TRANSDERMAL | Status: DC
  Administered 2017-06-29: 12.5 ug via TRANSDERMAL
  Filled 2017-06-29: qty 1

## 2017-06-29 MED ORDER — FENTANYL CITRATE (PF) 100 MCG/2ML IJ SOLN
100.0000 ug | Freq: Once | INTRAMUSCULAR | Status: AC
Start: 2017-06-29 — End: 2017-06-29
  Administered 2017-06-29: 100 ug via INTRAVENOUS

## 2017-06-29 MED ORDER — QUETIAPINE FUMARATE 25 MG PO TABS: 25.0000 mg | ORAL_TABLET | Freq: Once | ORAL | Status: DC

## 2017-06-29 MED ORDER — QUETIAPINE FUMARATE 25 MG PO TABS
25.0000 mg | ORAL_TABLET | Freq: Every day | ORAL | Status: DC
  Administered 2017-06-29 – 2017-07-01 (×3): 25 mg
  Filled 2017-06-29 (×3): qty 1

## 2017-06-29 MED ORDER — MIDAZOLAM HCL 2 MG/2ML IJ SOLN
4.0000 mg | Freq: Once | INTRAMUSCULAR | Status: AC
  Administered 2017-06-29: 4 mg via INTRAVENOUS

## 2017-06-29 MED ORDER — FENTANYL 2500MCG IN NS 250ML (10MCG/ML) PREMIX INFUSION
0.0000 ug/h | INTRAVENOUS | Status: DC
  Administered 2017-06-29: 10 ug/h via INTRAVENOUS
  Administered 2017-06-30: 50 ug/h via INTRAVENOUS
  Administered 2017-07-01: 75 ug/h via INTRAVENOUS
  Filled 2017-06-29 (×3): qty 250

## 2017-06-29 MED ORDER — DOCUSATE SODIUM 50 MG/5ML PO LIQD
100.0000 mg | Freq: Two times a day (BID) | ORAL | Status: DC
  Administered 2017-06-29 – 2017-07-02 (×7): 100 mg
  Filled 2017-06-29 (×7): qty 10

## 2017-06-29 MED ORDER — ENOXAPARIN SODIUM 40 MG/0.4ML ~~LOC~~ SOLN
40.0000 mg | Freq: Every day | SUBCUTANEOUS | Status: DC
  Administered 2017-06-29 – 2017-07-02 (×4): 40 mg via SUBCUTANEOUS
  Filled 2017-06-29 (×4): qty 0.4

## 2017-06-29 MED ORDER — VECURONIUM BROMIDE 10 MG IV SOLR
10.0000 mg | Freq: Once | INTRAVENOUS | Status: AC
  Administered 2017-06-29: 10 mg via INTRAVENOUS

## 2017-06-29 MED ORDER — FENTANYL CITRATE (PF) 100 MCG/2ML IJ SOLN
INTRAMUSCULAR | Status: AC
  Administered 2017-06-29: 100 ug via INTRAVENOUS
  Filled 2017-06-29: qty 2

## 2017-06-29 MED ORDER — MAGNESIUM SULFATE 2 GM/50ML IV SOLN
2.0000 g | Freq: Once | INTRAVENOUS | Status: AC
Start: 2017-06-29 — End: 2017-06-29
  Administered 2017-06-29: 2 g via INTRAVENOUS
  Filled 2017-06-29: qty 50

## 2017-06-29 MED ORDER — PIPERACILLIN-TAZOBACTAM 3.375 G IVPB
3.3750 g | Freq: Three times a day (TID) | INTRAVENOUS | Status: DC
  Administered 2017-06-29 – 2017-07-02 (×10): 3.375 g via INTRAVENOUS
  Filled 2017-06-29 (×10): qty 50

## 2017-06-29 MED ORDER — VECURONIUM BROMIDE 10 MG IV SOLR
INTRAVENOUS | Status: AC
  Administered 2017-06-29: 10 mg via INTRAVENOUS
  Filled 2017-06-29: qty 10

## 2017-06-29 NOTE — Consult Note (Signed)
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 skull through the vertex without intravenous contrast. COMPARISON:  CT of the head performed 05/28/2017 FINDINGS: Brain: No evidence of acute infarction, hemorrhage, hydrocephalus, extra-axial collection or mass lesion/mass effect. Prominence of the sulci suggests mild cortical volume loss. The brainstem and fourth ventricle are within normal limits. The basal ganglia are unremarkable in appearance. The cerebral hemispheres demonstrate grossly normal  gray-white differentiation. No mass effect or midline shift is seen. Vascular: No hyperdense vessel or unexpected calcification. Skull: There is no evidence of fracture; visualized osseous structures are unremarkable in appearance. Sinuses/Orbits: The visualized portions of the orbits are within normal limits. The paranasal sinuses and mastoid air cells are well-aerated. Other: No significant soft tissue abnormalities are seen. IMPRESSION: 1. No acute intracranial pathology seen on CT. 2. Mild cortical volume loss noted. Electronically Signed   By: Garald Balding M.D.   On: 06/09/2017 22:50   Mr Jeri Cos YN Contrast  Result Date: 06/24/2017 CLINICAL DATA:  Altered level of consciousness, unexplained. Uncontrolled diabetes with prolonged hypoglycemia. EXAM: MRI HEAD WITHOUT AND WITH CONTRAST TECHNIQUE: Multiplanar, multiecho pulse sequences  of the brain and surrounding structures were obtained without and with intravenous contrast. CONTRAST:  42m MULTIHANCE GADOBENATE DIMEGLUMINE 529 MG/ML IV SOLN COMPARISON:  Head CT from 2 days ago and earlier. FINDINGS: Brain: On FLAIR imaging the cortex appears diffusely hyperintense. Questionable restricted diffusion in the cortex on ADC map. On axial T1 weighted imaging sulci are questionably narrower than on more remote head CTs. No focal infarct, hemorrhage, hydrocephalus, or collection. No midline shift. Vascular: Major flow voids are preserved. Skull and upper cervical spine: Negative Sinuses/Orbits: No significant finding. IMPRESSION: No focal or reversible finding. Borderline FLAIR hyperintensity/edema throughout the cerebral cortex, but no visible swelling on the other sequences. This could be related to technique, global injury, or seizure phenomenon. Electronically Signed   By: JMonte FantasiaM.D.   On: 06/24/2017 13:35   Ct Abdomen Pelvis W Contrast  Result Date: 06/22/2017 CLINICAL DATA:  Pt in via [facility] from home; pt unresponsive upon arrival with snoring  respirations, hypotensive in the 894'Wsystolic. EXAM: CT ABDOMEN AND PELVIS WITH CONTRAST TECHNIQUE: Multidetector CT imaging of the abdomen and pelvis was performed using the standard protocol following bolus administration of intravenous contrast. CONTRAST:  766mISOVUE-300 IOPAMIDOL (ISOVUE-300) INJECTION 61% COMPARISON:  01/23/2017 FINDINGS: Lower chest: Hazy and reticular type opacity noted in the left lower lobe of the lung base. Small areas of hazy opacity noted at the base of the left upper lobe lingula. Minor dependent subsegmental atelectasis in the right lower lobe. Heart normal in size. Hepatobiliary: 12 mm low-density lesion in segment 7, stable consistent with a hemangioma. No other liver lesions. Gallbladder is unremarkable. No bile duct dilation. Pancreas: Extensive calcifications and pancreatic atrophy consistent with chronic pancreatitis, stable. No acute inflammation. No mass. Spleen: Normal in size without focal abnormality. Adrenals/Urinary Tract: No adrenal masses. Kidneys normal size, orientation and position. No renal masses or stones. Ureters normal course and caliber. The small amount of nondependent air is noted in the bladder. No bladder mass or wall thickening. Stomach/Bowel: Since prior CT, colon surgery has been performed. There is anastomosis staple at at the rectosigmoid junction. There is no bowel dilation to suggest obstruction. There is no colonic wall thickening or adjacent inflammation. A few diverticula are noted along the right colon. No evidence of diverticulitis. Small bowel is normal in caliber. Anastomotic staples are noted in the left upper quadrant, within the jejunum, unchanged from the prior study. No small bowel inflammation. Stomach is unremarkable. Vascular/Lymphatic: Aortic atherosclerosis. No enlarged abdominal or pelvic lymph nodes. Reproductive: Prostate mildly enlarged heterogeneous. It is stable from the prior study. Other: No ascites.  No abdominal wall  hernia. Musculoskeletal: No osteoblastic or osteolytic lesions. No fracture or acute finding. IMPRESSION: 1. Small patchy areas of opacity noted in the left lower lobe and base of the left upper lobe lingula. Although this may be atelectasis, pneumonia should be considered likely in the proper clinical setting. 2. No acute findings within the abdomen or pelvis. 3. No evidence of bowel obstruction or inflammation. 4. Status post sigmoid colon resection since the prior exam. No evidence of an operative complication. 5. Aortic atherosclerosis. Electronically Signed   By: DaLajean Manes.D.   On: 06/22/2017 15:46   Dg Chest Port 1 View  Result Date: 06/29/2017 CLINICAL DATA:  Hypoxia EXAM: PORTABLE CHEST 1 VIEW COMPARISON:  June 26, 2017 FINDINGS: Endotracheal tube tip is 1.6 cm above the carina. Nasogastric tube tip and side port are below the diaphragm. Central catheter tip is in the superior  vena cava. No pneumothorax. There is airspace consolidation in the right upper lobe and right base regions, somewhat better defined than on prior study. There is a small right pleural effusion. Left lung is clear except for slight left base atelectasis. Heart size and pulmonary vascularity are normal. No adenopathy. No evident bone lesions. IMPRESSION: Tube and catheter positions as described without pneumothorax. Areas of airspace consolidation felt to represent multifocal pneumonia right upper lobe and right base with small right pleural effusion. Mild left base atelectasis. Left lung otherwise clear. Cardiac silhouette is within normal limits. Electronically Signed   By: Lowella Grip III M.D.   On: 06/29/2017 12:59   Dg Chest Port 1 View  Result Date: 06/26/2017 CLINICAL DATA:  Hypoxia EXAM: PORTABLE CHEST 1 VIEW COMPARISON:  June 25, 2017 FINDINGS: Endotracheal tube tip is 4.7 cm above the carina. Central catheter tip is in the superior vena cava. Nasogastric tube tip and side port are in the stomach.  No pneumothorax. There is a right pleural effusion. There is slight bibasilar atelectasis. No frank edema or consolidation. Heart size and pulmonary vascularity are normal. No adenopathy. There is evidence of old rib trauma on each side. IMPRESSION: Tube and catheter positions as described without pneumothorax. Right pleural effusion with mild bibasilar atelectasis. No consolidation. Stable cardiac silhouette. Electronically Signed   By: Lowella Grip III M.D.   On: 06/26/2017 07:12   Dg Chest Port 1 View  Result Date: 06/25/2017 CLINICAL DATA:  56 year old male with a history of respiratory failure EXAM: PORTABLE CHEST 1 VIEW COMPARISON:  06/23/2017, 06/22/2017 FINDINGS: Cardiomediastinal silhouette unchanged in size and contour. Unchanged position of the endotracheal tube, which terminates approximately 4.7 cm above the carina. Unchanged position of left IJ central venous catheter with the tip appearing to terminate at the superior vena cava. Unchanged gastric tube terminating in the left upper quadrant. Interval development of dense opacity at the right base with maintained visualization of the right heart border, and obscuration of the right hemidiaphragm. No pneumothorax. Aeration maintained on the left. IMPRESSION: Interval development of right lower lobe collapse/ consolidation, potentially secondary to aspiration and/or mucous plugging. Unchanged endotracheal tube, gastric tube, and left IJ central venous catheter. Electronically Signed   By: Corrie Mckusick D.O.   On: 06/25/2017 07:40   Dg Chest Port 1 View  Result Date: 06/23/2017 CLINICAL DATA:  Acute onset of respiratory failure. EXAM: PORTABLE CHEST 1 VIEW COMPARISON:  Chest radiograph performed 06/22/2017 FINDINGS: The patient's endotracheal tube is seen ending 3-4 cm above the carina. A left IJ line is noted ending about the mid SVC. An enteric tube is noted ending overlying the body of the stomach. The lungs are well-aerated and clear.  There is no evidence of focal opacification, pleural effusion or pneumothorax. The cardiomediastinal silhouette is within normal limits. No acute osseous abnormalities are seen. Chronic left-sided rib deformities are noted. IMPRESSION: 1. Endotracheal tube seen ending 3-4 cm above the carina. 2. No acute cardiopulmonary process seen. Electronically Signed   By: Garald Balding M.D.   On: 06/23/2017 05:00   Dg Chest Port 1 View  Result Date: 06/22/2017 CLINICAL DATA:  Central line placement EXAM: PORTABLE CHEST 1 VIEW COMPARISON:  06/22/2017, 06/09/2017 FINDINGS: Endotracheal tube tip is about 2.6 cm superior to the carina. Left-sided central venous catheter tip projects over the SVC. No left pneumothorax is seen. Increasing opacity at the left base. No pleural effusion. Normal heart size. Old bilateral rib fractures. IMPRESSION: 1. Left-sided central venous catheter  tip overlies the SVC. Negative for pneumothorax 2. Increasing atelectasis or infiltrate at the left lung base. Electronically Signed   By: Donavan Foil M.D.   On: 06/22/2017 20:51   Dg Chest Port 1 View  Result Date: 06/22/2017 CLINICAL DATA:  Patient unresponsive with hypotension. 2 mg narcan given. EXAM: PORTABLE CHEST 1 VIEW COMPARISON:  06/09/2017 and 08/16/2016 FINDINGS: Endotracheal tube has tip 5.5 cm above the carina. Lungs are adequately inflated without consolidation, effusion or pneumothorax. Cardiomediastinal silhouette is within normal. Old left posterior rib fractures. IMPRESSION: No active disease. Endotracheal tube with tip 5.5 cm above the carina. Electronically Signed   By: Marin Olp M.D.   On: 06/22/2017 14:55   Dg Addison Bailey G Tube Plc W/fl W/rad  Result Date: 06/26/2017 CLINICAL DATA:  NG tube needed. EXAM: NASO G TUBE PLACEMENT WITH FL AND WITH RAD CONTRAST:  None. FLUOROSCOPY TIME:  Fluoroscopy Time:  3 minutes 6 seconds Radiation Exposure Index (if provided by the fluoroscopic device): 23.9 mGy Number of Acquired Spot  Images: 1 COMPARISON:  06/23/2017 . FINDINGS: Nursing supervised patient presented for NG tube placement. Following lubrication of a nasogastric tube and using fluoroscopic guidance NG tube was placed with tip placed to the distal stomach. There no complications. NG tube was secured to the nose by nursing. IMPRESSION: Successful fluoroscopically directed NG tube placement . Electronically Signed   By: Marcello Moores  Register   On: 06/26/2017 14:21    Assessment:   [name] is a 56 y.o. male with extensive PMH including recent admission for MRSA bacteremia associated with infected R portacath, inadequately treated after DC due to social issues, now readmitted with AMS, hypoglycemia, hypotension and respiratory failure..  He has stage III rectal adenocarcinoma status post laparoscopic LAR but he missed his fu chemo appointments and is outside window for adjunctive chemo per Oncology.  He has a long history of alcohol abuse, chronic pancreatitis and brittle poorly controlled diabetes. 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  He likely has Aspiration PNA and will need treatment for this. He has neg BCX and neg Sputum cx. Surprisingly cleared his MRSA bacteremia despite not getting his Otpt vancomycin.  Recommendations Would change to zosyn for a total 8 day course.   Then can stop abx and monitor wbc, fever curve.  Thank you very much for allowing me to participate in the care of this patient. Please call with questions.   Cheral Marker. Ola Spurr, MD

## 2017-06-29 NOTE — Progress Notes (Signed)
1730 Eventful day. Intubated again. After intubation Dr Ola Spurr in to see patient Patient actually opened eyes and looked at Dr. Ola Spurr. Eyes not fixed and looking down and right. Mom in and out. Mother requested a transfer to [facility] or [place]. Accepted by [facility] but no bed available  In the ICU areas.

## 2017-06-29 NOTE — Progress Notes (Addendum)
1100-1200 Oxygen saturations up and down, Agitation remains. Unable to get a consistantly oxygen saturation. Agitation increasing at times. Morphine given for agitation as ordered. Respiratory effort unchanged. Oxygen saturation continued to decrease,Dr. Mortimer Fries paged. ABG obtained. Patient intubated per Dr. Mortimer Fries.

## 2017-06-29 NOTE — Procedures (Signed)
Endotracheal Intubation: Patient required placement of an artificial airway secondary to Respiratory Failure  Consent: Emergent.   Hand washing performed prior to starting the procedure.   Medications administered for sedation prior to procedure:  Midazolam 4 mg IV,  Vecuronium 10 mg IV, Fentanyl 100 mcg IV.    A time out procedure was called and correct patient, name, & ID confirmed. Needed supplies and equipment were assembled and checked to include ETT, 10 ml syringe, Glidescope, Mac and Miller blades, suction, oxygen and bag mask valve, end tidal CO2 monitor.   Patient was positioned to align the mouth and pharynx to facilitate visualization of the glottis.   Heart rate, SpO2 and blood pressure was continuously monitored during the procedure. Pre-oxygenation was conducted prior to intubation and endotracheal tube was placed through the vocal cords into the trachea.     The artificial airway was placed under direct visualization via glidescope route using a 8.0 ETT on the first attempt.  ETT was secured at 23 cm mark.  Placement was confirmed by auscuitation of lungs with good breath sounds bilaterally and no stomach sounds.  Condensation was noted on endotracheal tube.   Pulse ox 98%.  CO2 detector in place with appropriate color change.   Complications: None .   Operator: Julianne Chamberlin.   Chest radiograph ordered and pending.    Corrin Parker, M.D.  [facility]  Medical Director [place] Director [facility] Cardio-Pulmonary Department

## 2017-06-29 NOTE — Discharge Summary (Addendum)
Physician Discharge Summary  Patient ID: [name] MRN: [id number] DOB/AGE: 57-Oct-1962 57 y.o.  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 Acute respiratory failure due to AMS RLL HCAP Hypotension, resolved Sinus bradycardia, resolved AKI, resolved Severe hyperchloremic acidosis, resolved Hypokalemia, resolved Hypernatremia, resolved Recent bowel resection for colon cancer Chronic pancreatitis with severe pancreatic insufficiency Severe protein-calorie malnutrition Severe diarrhea  Dysphagia due to encephalopathy Acute on chronic anemia without overt blood loss Thrombocytopenia, resolved Type 2 diabetes Severe hypoglycemia, resolved Hyperglycemia, controlled   Discharged Condition: stable  INITIAL PRESENTATION: 29 M with hx of recently resected colon cancer (04/17/17 at [facility]) and recent Port-A-Cath infection with MRSA bacteremia brought to [facility] ED by EMS for altered cognition and intubated in the ED for same.  Was found to be profoundly hypoglycemic by EMS.  Received dextrose without improvement in mental status.  Also received naloxone without improvement in cognition. Family reported 60# weight loss in past year.     MAJOR EVENTS/TEST RESULTS: 12/24 admission via ED as noted above.  Transfused 1 unit PRBCs in the ED for hemoglobin 7.3.  Bicarbonate infusion initiated for profound hyperchloremic acidosis 12/24 CT head: No acute findings 12/24 CTAP: No acute findings in the abdomen or pelvis.  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 Rec continued HCO3 infusion 12/25 Persistent coma despite no sedatives.  12/25 Neurology consultation: Concern for possible seizure. Keppra initiated MRI brain ordered 12/26 Severe diarrhea. TF rate reduced and pancreatic enzymes  initiated 12/26 more responsive with some spontaneous movement but not F/C. Fentanyl infusion initiated, then DC's. Intermittent fentanyl/midaz 12/26 MRI brain: Borderline FLAIR hyperintensity/edema throughout the cerebral cortex, but no visible swelling on the other sequences. This could be related to technique, global injury, or seizure phenomenon 12/26 EEG: This is an abnormal electroencephalogram secondary to a slow, unreactive, discontinuous background. This finding is nonspecific and may be seen in many clinical scenarios. No epileptiform activity is noted 12/27 increased psychomotor agitation.  Nonpurposeful.  Increased purulent respiratory secretions. 12/28 remains nonpurposeful with intermittent psychomotor agitation requiring intermittent fentanyl and midazolam 12/29 extubated 12/31 intubated. Family requested transfer to [facility] 12/31 Leukocytosis, fever. ID consultation. Abx adjusted   INDWELLING DEVICES:: ETT 12/24 >>  L IJ CVL 12/24 >>   MICRO DATA: Urine 12/24 >> 20k cfu yeast Resp 12/24 >> c/w NOF Blood 12/24 >> NEG C diff PCR 12/26 >> NEG GI panel 12/26 >> NEG Resp 12/26 >> c/w NOF Resp 12/31 >> Few candida Resp 01/02 >>   ANTIMICROBIALS:  Pip-tazo 12/24 >> 12/26 Vancomycin 12/24 >> 12/28 Cefepime 12/27 >> 12/31 Vanc 12/31 >> 01/02 Pip-tazo 12/31 >> (planned stop 01/08)   Consults: Nephrology Neurology ID   Treatments:  Vent support   Discharge Exam: Blood pressure 92/60, pulse 71, temperature 99.9 F (37.7 C), resp. rate 18, height 5\' 10"  (1.778 m), weight 123 lb 14.4 oz (56.2 kg), SpO2 100 %.   Disposition: [facility]  Signed: Flora Lipps 06/29/2017, 12:56 PM   Updated:  Merton Border, MD PCCM service Mobile [phone] Pager [phone] 07/02/2017 3:51 PM

## 2017-06-29 NOTE — Progress Notes (Signed)
PULMONARY / CRITICAL CARE MEDICINE   Name: [name] MRN: [id number] DOB: 04-26-1961    ADMISSION DATE:  06/22/2017 CONSULTATION DATE:  12/24  REFERRING MD: Margaretmary Eddy  PT PROFILE:   49 M with hx of recently resected colon cancer (04/17/17 at [facility]) and recent MRSA Port-A-Cath infection with bacteremia brought to [facility] ED by EMS for altered cognition and intubated in the ED for same.  Was found to be profoundly hypoglycemic by EMS.  Received dextrose without improvement in mental status.  Also received naloxone without improvement in cognition.   MAJOR EVENTS/TEST RESULTS: 12/24 admission via ED as noted above.  Transfused 1 unit PRBCs in the ED for hemoglobin 7.3.  Bicarbonate infusion initiated for profound hyperchloremic acidosis 12/24 CT head: No acute findings 12/24 CTAP: No acute findings in the abdomen or pelvis.  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 Rec continued HCO3 infusion 12/25 Persistent coma despite no sedatives.  12/25Neurology consultation: Concern for possible seizure. Keppra initiated MRI brain ordered 12/26 Severe diarrhea. TF rate reduced and pancreatic enzymes initiated 12/26 more responsive with some spontaneous movement but not F/C. Fentanyl infusion initiated, then DC's. Intermittent fentanyl/midaz 12/26 MRI brain: Borderline FLAIR hyperintensity/edema throughout the cerebral cortex, but no visible swelling on the other sequences. This could be related to technique, global injury, or seizure phenomenon 12/26 EEG: This is an abnormal electroencephalogram secondary to a slow, unreactive, discontinuous background.  This finding is nonspecific and may be seen in many clinical scenarios.  No epileptiform activity is noted 12/27 increased psychomotor agitation.  Nonpurposeful.  Increased purulent respiratory secretions. 12/28 remains nonpurposeful with intermittent psychomotor agitation requiring  intermittent fentanyl and midazolam  INDWELLING DEVICES:: ETT 12/24 >>  L IJ CVL 12/24 >>   MICRO DATA: Urine 12/24 >> 20k cfu yeast Resp 12/24 >> c/w NOF Blood 12/24 >> NEG C diff PCR 12/26 >> NEG GI panel 12/26 >> NEG  ANTIMICROBIALS:  Pip-tazo 12/24 >> 12/26 Vancomycin 12/24 >> 12/28 Cefepime 12/27 >>    SUBJECTIVE:  Psychomotor agitation.  Nonpurposeful.  Improved purulent respiratory secretions. successfully extubated  will wean off precedex , morphine as needed Prognosis is poor Plan for [facility] referral  VITAL SIGNS: BP 93/60   Pulse 79   Temp 99.7 F (37.6 C)   Resp 20   Ht 5\' 10"  (1.778 m)   Wt 123 lb 14.4 oz (56.2 kg)   SpO2 96%   BMI 17.78 kg/m   HEMODYNAMICS:    VENTILATOR SETTINGS: Vent Mode: Spontaneous FiO2 (%):  [28 %] 28 % PEEP:  [5 cmH20] 5 cmH20 Pressure Support:  [5 cmH20] 5 cmH20  INTAKE / OUTPUT: I/O last 3 completed shifts: In: 2874.4 [I.V.:1421.4; NG/GT:503; IV Piggyback:950] Out: 4500 [Urine:4500]  PHYSICAL EXAMINATION: General: agitated, squirmy Neuro: gaze now midline, withdraws all 4 ext, moves all 4 ext spontaneously Delirious HEENT: NCAT, sclerae white Cardiovascular: Regular, no M Lungs: few rhonchi Abdomen:  + bowel sounds, no palpable masses Ext: warm, no edema  LABS:  BMET Recent Labs  Lab 06/26/17 0322 06/27/17 0417 06/29/17 0457  NA 138 139 138  K 4.0 4.7 3.7  CL 109 108 104  CO2 26 27 28   BUN 10 16 12   CREATININE 0.65 0.84 0.75  GLUCOSE 146* 201* 139*    Electrolytes Recent Labs  Lab 06/25/17 0415 06/26/17 0322 06/27/17 0417 06/28/17 0526 06/29/17 0457  CALCIUM 7.8* 7.9* 8.1*  --  8.2*  MG 1.9  --   --  1.8 1.6*  PHOS 3.2  --   --  4.6 4.0    CBC Recent Labs  Lab 06/27/17 0417 06/28/17 0526 06/29/17 0457  WBC 23.3* 20.1* 12.0*  HGB 9.1* 8.3* 7.7*  HCT 28.8* 25.5* 23.2*  PLT 96* 110* 133*    Coag's Recent Labs  Lab 06/22/17 1815  APTT 43*  INR 1.32    Sepsis  Markers Recent Labs  Lab 06/22/17 1334 06/22/17 1815 06/22/17 2129 06/23/17 0444 06/24/17 0505 06/29/17 0457  LATICACIDVEN 1.1 0.8 1.0  --   --   --   PROCALCITON 0.11 <0.10  --  <0.10 0.24 4.07    ABG Recent Labs  Lab 06/22/17 1424  PHART 7.23*  PCO2ART 24*  PO2ART 233*    Liver Enzymes Recent Labs  Lab 06/22/17 1336 06/23/17 0444 06/27/17 0417  AST 30 35 26  ALT 32 28 16*  ALKPHOS 93 86 133*  BILITOT 0.5 0.8 0.6  ALBUMIN 2.1* 1.9* 1.7*    Cardiac Enzymes Recent Labs  Lab 06/22/17 1336  TROPONINI <0.03    Glucose Recent Labs  Lab 06/28/17 0734 06/28/17 1138 06/28/17 1656 06/28/17 2014 06/29/17 0017 06/29/17 0354  GLUCAP 172* 135* 81 148* 205* 47*    CXR: NSC RLL infiltrate and effusion  ASSESSMENT / PLAN:  PULMONARY Extubated Oxygen as needed  CARDIOVASCULAR SD monitoring  RENAL Follow UOP  GASTROINTESTINAL A:   Recent bowel resection for colon cancer Chronic pancreatitis with severe pancreatic insufficiency Severe protein-calorie malnutrition Severe diarrhea - improved with decrease in TF rate Dysphagia due to encephalopathy NGT placed-continue TF's  HEMATOLOGIC A:   Acute on chronic anemia without overt blood loss Thrombocytopenia  P:  DVT px: SCDs Monitor CBC intermittently Transfuse per usual guidelines   INFECTIOUS A:   RLL HCAP P:   Monitor temp, WBC count Micro and abx as above   ENDOCRINE A:   Type 2 diabetes Severe hypoglycemia, resolved Hyperglycemia, controlled P:   Monitor CBGs  Continue sensitive scale SSI - initiated 12/26  NEUROLOGIC A:   Acute encephalopathy - suspect hypoglycemic brain injury Neurology following   Corrin Parker, M.D.  [facility]  Medical Director [place] Director [facility] Cardio-Pulmonary Department

## 2017-06-29 NOTE — Progress Notes (Signed)
[place] at [place] NAME: [name]    MR#:  [id number]  DATE OF BIRTH:  1961/01/18  SUBJECTIVE:   Patient extubated in NRB mask  Seems agitated   REVIEW OF SYSTEMS:    Unable to obtain   Tolerating Diet: yes      DRUG ALLERGIES:   Allergies  Allergen Reactions  . Aspirin Itching and Other (See Comments)    Reaction: abdominal pain    VITALS:  Blood pressure 93/60, pulse 79, temperature 99.7 F (37.6 C), resp. rate 20, height 5\' 10"  (1.778 m), weight 56.2 kg (123 lb 14.4 oz), SpO2 96 %.  PHYSICAL EXAMINATION:  Constitutional: Appears thin and frail wearing a nonrebreather agitated nonpurposeful movements  HENT: Normocephalic. .  Eyes: Conjunctivae and EOM are normal. PERRLA, no scleral icterus.  Neck: Normal ROM. Neck supple. No JVD. No tracheal deviation. CVS: Tachycardia no murmurs, no gallops, no carotid bruit.  Pulmonary: Bilateral coarse breath sounds and wheezing Abdominal: Soft. BS +,  no distension, tenderness, rebound or guarding.  Musculoskeletal: Nonpurposeful movements  Neuro: Agitated Skin: Skin is warm and dry. No rash noted. Psychiatric: Agitated wearing nonrebreather    LABORATORY PANEL:   CBC Recent Labs  Lab 06/29/17 0457  WBC 12.0*  HGB 7.7*  HCT 23.2*  PLT 133*   ------------------------------------------------------------------------------------------------------------------  Chemistries  Recent Labs  Lab 06/27/17 0417  06/29/17 0457  NA 139  --  138  K 4.7  --  3.7  CL 108  --  104  CO2 27  --  28  GLUCOSE 201*  --  139*  BUN 16  --  12  CREATININE 0.84  --  0.75  CALCIUM 8.1*  --  8.2*  MG  --    < > 1.6*  AST 26  --   --   ALT 16*  --   --   ALKPHOS 133*  --   --   BILITOT 0.6  --   --    < > = values in this interval not displayed.   ------------------------------------------------------------------------------------------------------------------  Cardiac  Enzymes Recent Labs  Lab 06/22/17 1336  TROPONINI <0.03   ------------------------------------------------------------------------------------------------------------------  RADIOLOGY:  No results found.   ASSESSMENT AND PLAN:   56 year old male with recent resected colon cancer who is brought to the emergency room due to altered mental status  1. Acute hypoxic respiratory failure due to hypoglycemic coma and right lower lobe HCAP:  patient extubated on 06/28/2017  Management as per intensivist   2. RLL HCAP: Continue cefepime stop date tomorrow  3. Acute encephalopathy, metabolic in nature due to prolonged hypoglycemia Suspect hypoglycemic brain injury MRI of the brain reviewed and shows prominence of the cerebral cortex on flair. Differential is large and in this patient may be secondary to hypoperfusion, hypoglycemia or seizure activity. EEG slow but without evidence of seizure activity  Continue empiric Keppra  Consultation with neurology appreciated   4. History colon cancer status post colon resection:  5. Severe protein calorie malnutrition on tube feeds  6. Acute on chronic anemia chronic disease: Hemoglobin stable  7. Type 2 diabetes with severe hypoglycemia which is resolved: Continue sliding scale  8. Chronic anemia: Hemoglobin remained stable between 7.5-9 No need for transfusion   Overall prognosis is poor   Management plans discussed with Dr Mortimer Fries CODE STATUS: FUL  TOTAL TIME TAKING CARE OF THIS PATIENT: 22 minutes.     POSSIBLE D/C ??, DEPENDING ON CLINICAL CONDITION.  Gautham Hewins M.D on 06/29/2017 at 9:02 AM  Between 7am to 6pm - Pager - [phone] After 6pm go to [email or web address] - password [email or web address] [facility]  Office  [phone]  CC: Primary care physician; [name], MD  Note: This dictation was prepared with Dragon dictation along with smaller phrase technology. Any transcriptional errors that  result from this process are unintentional.

## 2017-06-30 ENCOUNTER — Other Ambulatory Visit: Payer: Self-pay

## 2017-06-30 LAB — PROCALCITONIN: Procalcitonin: 2.8 ng/mL

## 2017-06-30 LAB — GLUCOSE, CAPILLARY
GLUCOSE-CAPILLARY: 165 mg/dL — AB (ref 65–99)
Glucose-Capillary: 143 mg/dL — ABNORMAL HIGH (ref 65–99)
Glucose-Capillary: 78 mg/dL (ref 65–99)
Glucose-Capillary: 165 mg/dL — ABNORMAL HIGH (ref 65–99)
Glucose-Capillary: 122 mg/dL — ABNORMAL HIGH (ref 65–99)
Glucose-Capillary: 172 mg/dL — ABNORMAL HIGH (ref 65–99)

## 2017-06-30 MED ORDER — LACOSAMIDE 200 MG/20ML IV SOLN
200.0000 mg | Freq: Once | INTRAVENOUS | Status: AC
  Administered 2017-06-30: 200 mg via INTRAVENOUS
  Filled 2017-06-30: qty 20

## 2017-06-30 MED ORDER — ORAL CARE MOUTH RINSE
15.0000 mL | Freq: Four times a day (QID) | OROMUCOSAL | Status: DC
  Administered 2017-06-30 – 2017-07-02 (×9): 15 mL via OROMUCOSAL

## 2017-06-30 MED ORDER — CHLORHEXIDINE GLUCONATE 0.12% ORAL RINSE (MEDLINE KIT)
15.0000 mL | Freq: Two times a day (BID) | OROMUCOSAL | Status: DC
  Administered 2017-06-30 – 2017-07-02 (×4): 15 mL via OROMUCOSAL

## 2017-06-30 MED ORDER — SODIUM CHLORIDE 0.9 % IV SOLN
100.0000 mg | Freq: Two times a day (BID) | INTRAVENOUS | Status: DC
  Administered 2017-07-01 – 2017-07-02 (×4): 100 mg via INTRAVENOUS
  Filled 2017-06-30 (×8): qty 10

## 2017-06-30 NOTE — Progress Notes (Signed)
eLink Physician-Brief Progress Note Patient Name: [name] DOB: [birth year] MRN: [id number]   Date of Service  06/30/2017  HPI/Events of Note  Rn report ETT was 25 cm at lip previously, RT noted it to be at 22cm at lip, and moved back to 25 cm. No air leak noted, no distress, pt ventilating normally.  eICU Interventions  No need for repeat CXR, continue current management.         Laverle Hobby 06/30/2017, 8:24 PM

## 2017-06-30 NOTE — Progress Notes (Signed)
ETT found secured 22cm at lip. Last documentation and note was 25cm at lip. Advanced ETT to 25cm at the lip and secured. RN aware and MD notified.

## 2017-06-30 NOTE — Progress Notes (Signed)
PULMONARY / CRITICAL CARE MEDICINE   Name: [name] MRN: [id number] DOB: 1960/12/13    ADMISSION DATE:  06/22/2017 CONSULTATION DATE:  12/24  REFERRING MD: Margaretmary Eddy  PT PROFILE:   15 M with hx of recently resected colon cancer (04/17/17 at [facility]) and recent MRSA Port-A-Cath infection with bacteremia brought to [facility] ED by EMS for altered cognition and intubated in the ED for same.  Was found to be profoundly hypoglycemic by EMS.  Received dextrose without improvement in mental status.  Also received naloxone without improvement in cognition.   MAJOR EVENTS/TEST RESULTS: 12/24 admission via ED as noted above.  Transfused 1 unit PRBCs in the ED for hemoglobin 7.3.  Bicarbonate infusion initiated for profound hyperchloremic acidosis 12/24 CT head: No acute findings 12/24 CTAP: No acute findings in the abdomen or pelvis.  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 Rec continued HCO3 infusion 12/25 Persistent coma despite no sedatives.  12/25Neurology consultation: Concern for possible seizure. Keppra initiated MRI brain ordered 12/26 Severe diarrhea. TF rate reduced and pancreatic enzymes initiated 12/26 more responsive with some spontaneous movement but not F/C. Fentanyl infusion initiated, then DC's. Intermittent fentanyl/midaz 12/26 MRI brain: Borderline FLAIR hyperintensity/edema throughout the cerebral cortex, but no visible swelling on the other sequences. This could be related to technique, global injury, or seizure phenomenon 12/26 EEG: This is an abnormal electroencephalogram secondary to a slow, unreactive, discontinuous background.  This finding is nonspecific and may be seen in many clinical scenarios.  No epileptiform activity is noted 12/27 increased psychomotor agitation.  Nonpurposeful.  Increased purulent respiratory secretions. 12/28 remains nonpurposeful with intermittent psychomotor agitation requiring  intermittent fentanyl and midazolam 12/29 extubated 12/31 reintubated  INDWELLING DEVICES:: ETT 12/24 >>  L IJ CVL 12/24 >>   MICRO DATA: Urine 12/24 >> 20k cfu yeast Resp 12/24 >> c/w NOF Blood 12/24 >> NEG C diff PCR 12/26 >> NEG GI panel 12/26 >> NEG  ANTIMICROBIALS:  Pip-tazo 12/24 >> 12/26 Vancomycin 12/24 >> 12/28 Cefepime 12/27 >>    SUBJECTIVE:  Remains intubated Sedated On vent support [facility] transfer initiated per family request Remains critically ill   VITAL SIGNS: BP 106/61   Pulse 81   Temp 99.7 F (37.6 C)   Resp 15   Ht 5\' 10"  (1.778 m)   Wt 130 lb 1.1 oz (59 kg)   SpO2 100%   BMI 18.66 kg/m   HEMODYNAMICS:    VENTILATOR SETTINGS: Vent Mode: PRVC FiO2 (%):  [35 %-40 %] 35 % Set Rate:  [15 bmp] 15 bmp Vt Set:  [500 mL] 500 mL PEEP:  [5 cmH20] 5 cmH20  INTAKE / OUTPUT: I/O last 3 completed shifts: In: 1644.9 [I.V.:355.2; NG/GT:669.7; IV Piggyback:620] Out: 1308 [Urine:3420]  PHYSICAL EXAMINATION: General: sedated,intubated Neuro:GCS8T HEENT: NCAT, sclerae white Cardiovascular: Regular, no M Lungs: few rhonchi Abdomen:  + bowel sounds, no palpable masses Ext: warm, no edema  LABS:  BMET Recent Labs  Lab 06/26/17 0322 06/27/17 0417 06/29/17 0457  NA 138 139 138  K 4.0 4.7 3.7  CL 109 108 104  CO2 26 27 28   BUN 10 16 12   CREATININE 0.65 0.84 0.75  GLUCOSE 146* 201* 139*    Electrolytes Recent Labs  Lab 06/25/17 0415 06/26/17 0322 06/27/17 0417 06/28/17 0526 06/29/17 0457  CALCIUM 7.8* 7.9* 8.1*  --  8.2*  MG 1.9  --   --  1.8 1.6*  PHOS 3.2  --   --  4.6 4.0    CBC Recent Labs  Lab 06/27/17 0417 06/28/17 0526 06/29/17 0457  WBC 23.3* 20.1* 12.0*  HGB 9.1* 8.3* 7.7*  HCT 28.8* 25.5* 23.2*  PLT 96* 110* 133*    Coag's No results for input(s): APTT, INR in the last 168 hours.  Sepsis Markers Recent Labs  Lab 06/24/17 0505 06/29/17 0457 06/30/17 0613  PROCALCITON 0.24 4.07 2.80    ABG Recent  Labs  Lab 06/29/17 1202  PHART 7.47*  PCO2ART 40  PO2ART 80*    Liver Enzymes Recent Labs  Lab 06/27/17 0417  AST 26  ALT 16*  ALKPHOS 133*  BILITOT 0.6  ALBUMIN 1.7*    Cardiac Enzymes No results for input(s): TROPONINI, PROBNP in the last 168 hours.  Glucose Recent Labs  Lab 06/29/17 0836 06/29/17 1155 06/29/17 1716 06/29/17 1947 06/30/17 0029 06/30/17 0602  GLUCAP 73 108* 115* 207* 165* 143*    CXR: NSC RLL infiltrate and effusion ASSESSMENT / PLAN:  PULMONARY Extubated-and then reintubated unable to protect airway On vent Oxygen as needed Will need trach/PEG tube for survival Neuro prognosis is poor Family wants second opinion awaiting [facility] transfer  CARDIOVASCULAR SD monitoring  RENAL Follow [facility]OP  GASTROINTESTINAL A:   Recent bowel resection for colon cancer Chronic pancreatitis with severe pancreatic insufficiency Severe protein-calorie malnutrition Severe diarrhea - improved with decrease in TF rate Dysphagia due to encephalopathy NGT placed-continue TF's  HEMATOLOGIC A:   Acute on chronic anemia without overt blood loss Thrombocytopenia  P:  DVT px: SCDs Monitor CBC intermittently Transfuse per usual guidelines   INFECTIOUS A:   RLL HCAP P:   Monitor temp, WBC count Micro and abx as above   ENDOCRINE A:   Type 2 diabetes Severe hypoglycemia, resolved Hyperglycemia, controlled P:   Monitor CBGs  Continue sensitive scale SSI - initiated 12/26  NEUROLOGIC A:   Acute encephalopathy - suspect hypoglycemic brain injury sedated   Critical Care Time devoted to patient care services described in this note is 45 minutes.   Overall, patient is critically ill, prognosis is guarded.  Patient with Multiorgan failure and at high risk for cardiac arrest and death.    Corrin Parker, M.D.  [facility]  Medical Director [facility] Director [facility] Cardio-Pulmonary Department

## 2017-06-30 NOTE — Progress Notes (Signed)
[place] at [place] NAME: [name]    MR#:  [id number]  DATE OF BIRTH:  11/29/1960  SUBJECTIVE:   Patient reintubated yesterday  REVIEW OF SYSTEMS:    Unable to obtain   Tolerating Diet: yes      DRUG ALLERGIES:   Allergies  Allergen Reactions  . Aspirin Itching and Other (See Comments)    Reaction: abdominal pain    VITALS:  Blood pressure 104/62, pulse 81, temperature 99.3 F (37.4 C), resp. rate 15, height 5\' 10"  (1.778 m), weight 59 kg (130 lb 1.1 oz), SpO2 100 %.  PHYSICAL EXAMINATION:  Constitutional: Appears thin and frail  Intubated and sedated  HENT: Normocephalic. .  Eyes: Conjunctivae  are normal. PERRLA, no scleral icterus.  Neck:. Neck supple. No JVD. No tracheal deviation. CVS: Tachycardia no murmurs, no gallops, no carotid bruit.  Pulmonary: Bilateral coarse breath sounds and wheezing Abdominal: Soft. BS +,  no distension, tenderness, rebound or guarding.  Musculoskeletal: Normal tone  Neuro: Intubated and sedated Skin: Skin is warm and dry. No rash noted. Psychiatric: Intubated   LABORATORY PANEL:   CBC Recent Labs  Lab 06/29/17 0457  WBC 12.0*  HGB 7.7*  HCT 23.2*  PLT 133*   ------------------------------------------------------------------------------------------------------------------  Chemistries  Recent Labs  Lab 06/27/17 0417  06/29/17 0457  NA 139  --  138  K 4.7  --  3.7  CL 108  --  104  CO2 27  --  28  GLUCOSE 201*  --  139*  BUN 16  --  12  CREATININE 0.84  --  0.75  CALCIUM 8.1*  --  8.2*  MG  --    < > 1.6*  AST 26  --   --   ALT 16*  --   --   ALKPHOS 133*  --   --   BILITOT 0.6  --   --    < > = values in this interval not displayed.   ------------------------------------------------------------------------------------------------------------------  Cardiac Enzymes No results for input(s): TROPONINI in the last 168  hours. ------------------------------------------------------------------------------------------------------------------  RADIOLOGY:  Dg Chest Port 1 View  Result Date: 06/29/2017 CLINICAL DATA:  Hypoxia EXAM: PORTABLE CHEST 1 VIEW COMPARISON:  June 26, 2017 FINDINGS: Endotracheal tube tip is 1.6 cm above the carina. Nasogastric tube tip and side port are below the diaphragm. Central catheter tip is in the superior vena cava. No pneumothorax. There is airspace consolidation in the right upper lobe and right base regions, somewhat better defined than on prior study. There is a small right pleural effusion. Left lung is clear except for slight left base atelectasis. Heart size and pulmonary vascularity are normal. No adenopathy. No evident bone lesions. IMPRESSION: Tube and catheter positions as described without pneumothorax. Areas of airspace consolidation felt to represent multifocal pneumonia right upper lobe and right base with small right pleural effusion. Mild left base atelectasis. Left lung otherwise clear. Cardiac silhouette is within normal limits. Electronically Signed   By: Lowella Grip III M.D.   On: 06/29/2017 12:59     ASSESSMENT AND PLAN:   57 year old male with recent resected colon cancer who is brought to the emergency room due to altered mental status  1. Acute hypoxic respiratory failure due to hypoglycemic coma and right lower lobe HCAP:  patient extubated on 06/28/2017 however reintubated 06/29/2017 for inability to protect airway Management as per intensivist   2. RLL aspiration pneumonia: Cefepime changed to Zosyn for 8  days total (1/8)as per ID consult.  3. Acute encephalopathy, metabolic in nature due to prolonged hypoglycemia Suspect hypoglycemic brain injury MRI of the brain reviewed and shows prominence of the cerebral cortex on flair. Differential is large and in this patient may be secondary to hypoperfusion, hypoglycemia or seizure activity. EEG  slow but without evidence of seizure activity  Continue empiric Keppra  Consultation with neurology appreciated   4. History colon cancer status post colon resection:  5. Severe protein calorie malnutrition on tube feeds  6. Acute on chronic anemia chronic disease: Hemoglobin stable  7. Type 2 diabetes with severe hypoglycemia which is resolved: Continue sliding scale  8. Chronic anemia: Hemoglobin remained stable between 7.5-9 No need for transfusion  Patient will likely need trach and PEG Family wants second opinion  Awaiting bed at [facility] for transfer  Overall prognosis is poor   Management plans discussed with Dr Mortimer Fries CODE STATUS: FUL  TOTAL TIME TAKING CARE OF THIS PATIENT: 22 minutes.     POSSIBLE D/C ??, DEPENDING ON CLINICAL CONDITION.   [name] M.D on 06/30/2017 at 8:17 AM  Between 7am to 6pm - Pager - [phone] After 6pm go to [email or web address] - password [email or web address] [facility]  Office  [phone]  CC: Primary care physician; [name], MD  Note: This dictation was prepared with Dragon dictation along with smaller phrase technology. Any transcriptional errors that result from this process are unintentional.

## 2017-06-30 NOTE — Progress Notes (Signed)
Pt's hands in facial area.  Frequent non-purposeful movement.   Pt disconnected his ETT from the vent accidentally.

## 2017-06-30 NOTE — Progress Notes (Addendum)
Subjective: Patient intubated.  Continues to not follow commands.  Objective: Current vital signs: BP 131/72   Pulse 91   Temp 99.3 F (37.4 C)   Resp 16   Ht 5' 10" (1.778 m)   Wt 59 kg (130 lb 1.1 oz)   SpO2 100%   BMI 18.66 kg/m  Vital signs in last 24 hours: Temp:  [98.8 F (37.1 C)-100.6 F (38.1 C)] 99.3 F (37.4 C) (01/01 1200) Pulse Rate:  [68-91] 91 (01/01 1200) Resp:  [15-17] 16 (01/01 1200) BP: (74-131)/(43-76) 131/72 (01/01 1200) SpO2:  [99 %-100 %] 100 % (01/01 1213) FiO2 (%):  [35 %-40 %] 35 % (01/01 1213) Weight:  [59 kg (130 lb 1.1 oz)] 59 kg (130 lb 1.1 oz) (01/01 0145)  Intake/Output from previous day: 12/31 0701 - 01/01 0700 In: 750.9 [I.V.:184.3; NG/GT:366.7; IV Piggyback:200] Out: 1920 [Urine:1920] Intake/Output this shift: Total I/O In: 508.2 [I.V.:68.2; NG/GT:440] Out: 1125 [Urine:250; Emesis/NG output:875] Nutritional status: No diet orders on file  Neurologic Exam: Mental Status: Eyes open.  Turning head from side to side.  Resists active head turning.  No attempts at speech.  Does not follow commands.   Cranial Nerves: II: patient does not respond confrontation bilaterally, pupils right 3 mm, left 3 mm,and sluggishly reactive bilaterally III,IV,VI: right eye deviation.  No visual fixation VIII: patient does not respond to verbal stimuli Motor: Left hand fisted but able to be actively released.   Sensory: Does not respond to noxious stimuli in any extremity.   Lab Results: Basic Metabolic Panel: Recent Labs  Lab 06/24/17 0505 06/24/17 1930 06/25/17 0415 06/26/17 0322 06/27/17 0417 06/28/17 0526 06/29/17 0457  NA 140 140 139 138 139  --  138  K 4.8 3.5 3.6 4.0 4.7  --  3.7  CL 111 109 109 109 108  --  104  CO2 _0 --  28  GLUCOSE 173* 109* 162* 146* 201*  --  139*  BUN _1 --  12  CREATININE 0.79 0.73 0.81 0.65 0.84  --  0.75  CALCIUM 7.7* 7.7* 7.8* 7.9* 8.1*  --  8.2*  MG 1.8  --  1.9  --   --  1.8  1.6*  PHOS 1.7* 2.1* 3.2  --   --  4.6 4.0    Liver Function Tests: Recent Labs  Lab 06/27/17 0417  AST 26  ALT 16*  ALKPHOS 133*  BILITOT 0.6  PROT 5.6*  ALBUMIN 1.7*   No results for input(s): LIPASE, AMYLASE in the last 168 hours. No results for input(s): AMMONIA in the last 168 hours.  CBC: Recent Labs  Lab 06/25/17 0415 06/26/17 0510 06/27/17 0417 06/28/17 0526 06/29/17 0457  WBC 14.8* 18.6* 23.3* 20.1* 12.0*  HGB 7.9* 8.5* 9.1* 8.3* 7.7*  HCT 23.7* 25.8* 28.8* 25.5* 23.2*  MCV 90.0 90.7 91.8 91.0 90.7  PLT 53* 58* 96* 110* 133*    Cardiac Enzymes: No results for input(s): CKTOTAL, CKMB, CKMBINDEX, TROPONINI in the last 168 hours.  Lipid Panel: Recent Labs  Lab 06/24/17 2335  TRIG 103    CBG: Recent Labs  Lab 06/29/17 1155 06/29/17 1716 06/29/17 1947 06/30/17 0029 06/30/17 0602  GLUCAP 108* 115* 207* 165* 8*    Microbiology: Results for orders placed or performed during the hospital encounter of 06/22/17  Blood Culture (routine x 2)     Status: None   Collection Time: 06/22/17  1:34 PM  Result Value Ref  Range Status   Specimen Description BLOOD LEFT ANTECUBITAL  Final   Special Requests   Final    BOTTLES DRAWN AEROBIC AND ANAEROBIC Blood Culture adequate volume   Culture   Final    NO GROWTH 5 DAYS Performed at [facility], [address] [place], [place] [address]    Report Status 06/27/2017 FINAL  Final  Blood Culture (routine x 2)     Status: None   Collection Time: 06/22/17  1:34 PM  Result Value Ref Range Status   Specimen Description BLOOD LEFT ANTECUBITAL  Final   Special Requests   Final    BOTTLES DRAWN AEROBIC AND ANAEROBIC Blood Culture adequate volume   Culture   Final    NO GROWTH 5 DAYS Performed at [facility], [address] [place], [place] [address]    Report Status 06/27/2017 FINAL  Final  Urine culture     Status: Abnormal   Collection Time: 06/22/17  1:34 PM  Result Value Ref Range  Status   Specimen Description   Final    URINE, RANDOM Performed at [facility], [address], [place], [place] [address]    Special Requests   Final    NONE Performed at [facility], [address], [place], [place] [address]    Culture 20,000 COLONIES/mL YEAST (A)  Final   Report Status 06/24/2017 FINAL  Final  MRSA PCR Screening     Status: None   Collection Time: 06/22/17  6:00 PM  Result Value Ref Range Status   MRSA by PCR NEGATIVE NEGATIVE Final    Comment:        The GeneXpert MRSA Assay (FDA approved for NASAL specimens only), is one component of a comprehensive MRSA colonization surveillance program. It is not intended to diagnose MRSA infection nor to guide or monitor treatment for MRSA infections. Performed at [facility], [address] [place], [place] [address]   C difficile quick scan w PCR reflex     Status: None   Collection Time: 06/24/17  5:05 AM  Result Value Ref Range Status   C Diff antigen NEGATIVE NEGATIVE Final   C Diff toxin NEGATIVE NEGATIVE Final   C Diff interpretation No C. difficile detected.  Final    Comment: Performed at [facility], [address] [place], [place] [address]  Gastrointestinal Panel by PCR , Stool     Status: None   Collection Time: 06/24/17  5:05 AM  Result Value Ref Range Status   Campylobacter species NOT DETECTED NOT DETECTED Final   Plesimonas shigelloides NOT DETECTED NOT DETECTED Final   Salmonella species NOT DETECTED NOT DETECTED Final   Yersinia enterocolitica NOT DETECTED NOT DETECTED Final   Vibrio species NOT DETECTED NOT DETECTED Final   Vibrio cholerae NOT DETECTED NOT DETECTED Final   Enteroaggregative E coli (EAEC) NOT DETECTED NOT DETECTED Final   Enteropathogenic E coli (EPEC) NOT DETECTED NOT DETECTED Final   Enterotoxigenic E coli (ETEC) NOT DETECTED NOT DETECTED Final   Shiga like toxin producing E coli (STEC) NOT DETECTED NOT DETECTED Final    Shigella/Enteroinvasive E coli (EIEC) NOT DETECTED NOT DETECTED Final   Cryptosporidium NOT DETECTED NOT DETECTED Final   Cyclospora cayetanensis NOT DETECTED NOT DETECTED Final   Entamoeba histolytica NOT DETECTED NOT DETECTED Final   Giardia lamblia NOT DETECTED NOT DETECTED Final   Adenovirus F40/41 NOT DETECTED NOT DETECTED Final   Astrovirus NOT DETECTED NOT DETECTED Final  Norovirus GI/GII NOT DETECTED NOT DETECTED Final   Rotavirus A NOT DETECTED NOT DETECTED Final   Sapovirus (I, II, IV, and V) NOT DETECTED NOT DETECTED Final    Comment: Performed at [facility], [address] [place], [place] [address]  Culture, respiratory (NON-Expectorated)     Status: None   Collection Time: 06/24/17  5:30 AM  Result Value Ref Range Status   Specimen Description   Final    TRACHEAL ASPIRATE Performed at [facility], [address] [place], [place] [address]    Special Requests   Final    NONE Performed at [facility], [address] [place], [place] [address]    Gram Stain   Final    RARE WBC PRESENT, PREDOMINANTLY PMN MODERATE GRAM NEGATIVE RODS RARE YEAST    Culture   Final    MODERATE Consistent with normal respiratory flora. Performed at [facility], [place] [address], [place], [place] [address]    Report Status 06/26/2017 FINAL  Final  Culture, respiratory (NON-Expectorated)     Status: None (Preliminary result)   Collection Time: 06/29/17  1:44 PM  Result Value Ref Range Status   Specimen Description   Final    TRACHEAL ASPIRATE Performed at [facility], [address], [place], [place] [address]    Special Requests   Final    NONE Performed at [facility], [address] [place], [place] [address]    Gram Stain   Final    ABUNDANT WBC PRESENT,BOTH PMN AND MONONUCLEAR NO ORGANISMS SEEN    Culture   Final    NO GROWTH < 24 HOURS Performed at [facility], [place] [address] [place], [place]  [address]    Report Status PENDING  Incomplete    Coagulation Studies: No results for input(s): LABPROT, INR in the last 72 hours.  Imaging: Dg Chest Port 1 View  Result Date: 06/29/2017 CLINICAL DATA:  Hypoxia EXAM: PORTABLE CHEST 1 VIEW COMPARISON:  June 26, 2017 FINDINGS: Endotracheal tube tip is 1.6 cm above the carina. Nasogastric tube tip and side port are below the diaphragm. Central catheter tip is in the superior vena cava. No pneumothorax. There is airspace consolidation in the right upper lobe and right base regions, somewhat better defined than on prior study. There is a small right pleural effusion. Left lung is clear except for slight left base atelectasis. Heart size and pulmonary vascularity are normal. No adenopathy. No evident bone lesions. IMPRESSION: Tube and catheter positions as described without pneumothorax. Areas of airspace consolidation felt to represent multifocal pneumonia right upper lobe and right base with small right pleural effusion. Mild left base atelectasis. Left lung otherwise clear. Cardiac silhouette is within normal limits. Electronically Signed   By: Lowella Grip III M.D.   On: 06/29/2017 12:59    Medications:  I have reviewed the patient's current medications. Scheduled: . chlorhexidine gluconate (MEDLINE KIT)  15 mL Mouth Rinse BID  . docusate  100 mg Per Tube BID  . enoxaparin (LOVENOX) injection  40 mg Subcutaneous Daily  . famotidine  20 mg Per Tube Daily  . feeding supplement (PRO-STAT SUGAR FREE 64)  30 mL Per Tube BID  . feeding supplement (VITAL HIGH PROTEIN)  1,000 mL Per Tube Q24H  . insulin aspart  0-9 Units Subcutaneous Q4H  . ipratropium-albuterol  3 mL Nebulization Q6H  . lipase/protease/amylase  36,000 Units Oral Q6H  . mouth rinse  15 mL Mouth  Rinse QID  . multivitamin  15 mL Per Tube Daily  . QUEtiapine  25 mg Per Tube QHS    Assessment/Plan: Patient unchanged. On Keppra.  No seizure activity noted.  Will rule out  influence of Keppra on mental status. Spoke with aunt today. Discussed poor prognosis for functional recovery.   Recommendations: 1.  D/C Keppra 2.  Load Vimpat 225m with maintenance of 1040mBID    LOS: 8 days   LeAlexis GoodellMD Neurology [phone]/06/2017  12:43 PM

## 2017-07-01 ENCOUNTER — Other Ambulatory Visit: Payer: Self-pay

## 2017-07-01 ENCOUNTER — Inpatient Hospital Stay: Payer: Medicaid Other

## 2017-07-01 LAB — CBC
HEMATOCRIT: 23.2 % — AB (ref 40.0–52.0)
Hemoglobin: 7.4 g/dL — ABNORMAL LOW (ref 13.0–18.0)
MCH: 28.6 pg (ref 26.0–34.0)
MCHC: 31.8 g/dL — ABNORMAL LOW (ref 32.0–36.0)
MCV: 90.1 fL (ref 80.0–100.0)
PLATELETS: 277 10*3/uL (ref 150–440)
RBC: 2.57 MIL/uL — AB (ref 4.40–5.90)
RDW: 14.8 % — AB (ref 11.5–14.5)
WBC: 36.9 10*3/uL — AB (ref 3.8–10.6)

## 2017-07-01 LAB — GLUCOSE, CAPILLARY
GLUCOSE-CAPILLARY: 120 mg/dL — AB (ref 65–99)
GLUCOSE-CAPILLARY: 174 mg/dL — AB (ref 65–99)
GLUCOSE-CAPILLARY: 143 mg/dL — AB (ref 65–99)
GLUCOSE-CAPILLARY: 209 mg/dL — AB (ref 65–99)
GLUCOSE-CAPILLARY: 164 mg/dL — AB (ref 65–99)
Glucose-Capillary: 156 mg/dL — ABNORMAL HIGH (ref 65–99)
Glucose-Capillary: 228 mg/dL — ABNORMAL HIGH (ref 65–99)

## 2017-07-01 LAB — BASIC METABOLIC PANEL
Anion gap: 8 (ref 5–15)
BUN: 16 mg/dL (ref 6–20)
CO2: 29 mmol/L (ref 22–32)
CREATININE: 0.79 mg/dL (ref 0.61–1.24)
Calcium: 8.1 mg/dL — ABNORMAL LOW (ref 8.9–10.3)
Chloride: 102 mmol/L (ref 101–111)
Glucose, Bld: 158 mg/dL — ABNORMAL HIGH (ref 65–99)
POTASSIUM: 3.3 mmol/L — AB (ref 3.5–5.1)
SODIUM: 139 mmol/L (ref 135–145)

## 2017-07-01 LAB — MAGNESIUM: Magnesium: 1.9 mg/dL (ref 1.7–2.4)

## 2017-07-01 MED ORDER — MAGNESIUM OXIDE 400 (241.3 MG) MG PO TABS
400.0000 mg | ORAL_TABLET | Freq: Two times a day (BID) | ORAL | Status: DC
  Administered 2017-07-01 – 2017-07-02 (×3): 400 mg
  Filled 2017-07-01 (×3): qty 1

## 2017-07-01 MED ORDER — POTASSIUM CHLORIDE 20 MEQ PO PACK
40.0000 meq | PACK | Freq: Once | ORAL | Status: AC
  Administered 2017-07-01: 40 meq
  Filled 2017-07-01: qty 2

## 2017-07-01 MED ORDER — VANCOMYCIN HCL 10 G IV SOLR
1250.0000 mg | Freq: Once | INTRAVENOUS | Status: AC
  Administered 2017-07-01: 1250 mg via INTRAVENOUS
  Filled 2017-07-01: qty 1250

## 2017-07-01 MED ORDER — VANCOMYCIN HCL IN DEXTROSE 1-5 GM/200ML-% IV SOLN
1000.0000 mg | Freq: Two times a day (BID) | INTRAVENOUS | Status: DC
  Filled 2017-07-01: qty 200

## 2017-07-01 MED ORDER — VANCOMYCIN HCL IN DEXTROSE 1-5 GM/200ML-% IV SOLN
1000.0000 mg | Freq: Two times a day (BID) | INTRAVENOUS | Status: DC
  Administered 2017-07-02 (×2): 1000 mg via INTRAVENOUS
  Filled 2017-07-01 (×4): qty 200

## 2017-07-01 MED ORDER — ACETAMINOPHEN 650 MG RE SUPP
650.0000 mg | Freq: Four times a day (QID) | RECTAL | Status: DC | PRN
  Administered 2017-07-01: 650 mg via RECTAL
  Filled 2017-07-01: qty 1

## 2017-07-01 MED ORDER — MIDAZOLAM HCL 2 MG/2ML IJ SOLN
INTRAMUSCULAR | Status: AC
  Administered 2017-07-01: 2 mg
  Filled 2017-07-01: qty 2

## 2017-07-01 MED ORDER — VITAL AF 1.2 CAL PO LIQD
1000.0000 mL | ORAL | Status: DC
  Administered 2017-07-01 – 2017-07-02 (×2): 1000 mL

## 2017-07-01 NOTE — Care Management (Signed)
RNCM received call from ICU stating that DSS had called wanting to know "if they needed to get involved".  RNCM is not aware of any DSS involvement with case. I have reached out to [place] and she is not aware either. Currently there is no FYI noted on this chart to indicate DSS involvement.

## 2017-07-01 NOTE — Progress Notes (Signed)
[place] [facility] [place] NAME: Plez Belton    MR#:  [id number]  DATE OF BIRTH:  30-Dec-1960  SUBJECTIVE:   Patient remains intubated. Keppra discontinued by neurologist and now placed on Vimpat to r/o encephalopathy from [place]:    Unable to obtain   Tolerating Diet: yes      DRUG ALLERGIES:   Allergies  Allergen Reactions  . Aspirin Itching and Other (See Comments)    Reaction: abdominal pain    VITALS:  Blood pressure (!) 82/59, pulse (!) 101, temperature (!) 97.2 F (36.2 C), temperature source Rectal, resp. rate 17, height 5\' 10"  (1.778 m), weight 59 kg (130 lb 1.1 oz), SpO2 95 %.  PHYSICAL EXAMINATION:  Constitutional: Appears thin and frail  Intubated and sedated  HENT: Normocephalic.   Eyes: Conjunctivae  are normal. PERRLA, no scleral icterus.  Neck:. Neck supple. No JVD. No tracheal deviation. CVS: Tachycardia no murmurs, no gallops, no carotid bruit.  Pulmonary: Bilateral coarse breath sounds and wheezing Abdominal: Soft. BS +,  no distension, tenderness, rebound or guarding.  Musculoskeletal: Normal tone  Neuro: Intubated and sedated Skin: Skin is warm and dry. No rash noted. Psychiatric: Intubated   LABORATORY PANEL:   CBC Recent Labs  Lab 06/29/17 0457  WBC 12.0*  HGB 7.7*  HCT 23.2*  PLT 133*   ------------------------------------------------------------------------------------------------------------------  Chemistries  Recent Labs  Lab 06/27/17 0417  06/29/17 0457  NA 139  --  138  K 4.7  --  3.7  CL 108  --  104  CO2 27  --  28  GLUCOSE 201*  --  139*  BUN 16  --  12  CREATININE 0.84  --  0.75  CALCIUM 8.1*  --  8.2*  MG  --    < > 1.6*  AST 26  --   --   ALT 16*  --   --   ALKPHOS 133*  --   --   BILITOT 0.6  --   --    < > = values in this interval not displayed.    ------------------------------------------------------------------------------------------------------------------  Cardiac Enzymes No results for input(s): TROPONINI in the last 168 hours. ------------------------------------------------------------------------------------------------------------------  RADIOLOGY:  Dg Chest Port 1 View  Result Date: 06/29/2017 CLINICAL DATA:  Hypoxia EXAM: PORTABLE CHEST 1 VIEW COMPARISON:  June 26, 2017 FINDINGS: Endotracheal tube tip is 1.6 cm above the carina. Nasogastric tube tip and side port are below the diaphragm. Central catheter tip is in the superior vena cava. No pneumothorax. There is airspace consolidation in the right upper lobe and right base regions, somewhat better defined than on prior study. There is a small right pleural effusion. Left lung is clear except for slight left base atelectasis. Heart size and pulmonary vascularity are normal. No adenopathy. No evident bone lesions. IMPRESSION: Tube and catheter positions as described without pneumothorax. Areas of airspace consolidation felt to represent multifocal pneumonia right upper lobe and right base with small right pleural effusion. Mild left base atelectasis. Left lung otherwise clear. Cardiac silhouette is within normal limits. Electronically Signed   By: Lowella Grip III M.D.   On: 06/29/2017 12:59     ASSESSMENT AND PLAN:   57 year old male with recent resected colon cancer who is brought to the emergency room due to altered mental status  1. Acute hypoxic respiratory failure due to hypoglycemic coma and right lower lobe HCAP:  patient extubated on 06/28/2017 however reintubated 06/29/2017 for  inability to protect airway Management as per intensivist   2. RLL aspiration pneumonia: Cefepime changed to Zosyn for 8 days total (2/8)as per ID consult.  3. Acute encephalopathy, metabolic in nature due to prolonged hypoglycemia Suspect hypoglycemic brain injury MRI of  the brain reviewed and shows prominence of the cerebral cortex on flair. Differential is large and in this patient may be secondary to hypoperfusion, hypoglycemia or seizure activity. EEG slow but without evidence of seizure activity  Keppra changed to Vimpat to rule out encephalopathy by neurologist yesterday  Consultation with neurology appreciated   4. History colon cancer status post colon resection:  5. Severe protein calorie malnutrition on tube feeds  6. Acute on chronic anemia chronic disease: Hemoglobin stable  7. Type 2 diabetes with severe hypoglycemia which is resolved: Continue sliding scale  8. Chronic anemia: Hemoglobin remained stable between 7.5-9 No need for transfusion  Patient will likely need trach and PEG Family wants second opinion  Awaiting bed at [facility] for transfer  Overall prognosis is poor   Management plans discussed with Dr Mortimer Fries CODE STATUS: FUL  TOTAL TIME TAKING CARE OF THIS PATIENT: 22 minutes.     POSSIBLE D/C ??, DEPENDING ON CLINICAL CONDITION.   Sakira Dahmer M.D on 07/01/2017 at 8:40 AM  Between 7am to 6pm - Pager - [phone] After 6pm go to [email or web address] - password [email or web address] [facility]  Office  [phone]  CC: Primary care physician; [name], MD  Note: This dictation was prepared with Dragon dictation along with smaller phrase technology. Any transcriptional errors that result from this process are unintentional.

## 2017-07-01 NOTE — Progress Notes (Signed)
Nutrition Follow-up  DOCUMENTATION CODES:   Severe malnutrition in context of chronic illness, Underweight  INTERVENTION:  Switch to Vital 1.2 at 59mL/hr Provides 1728 calories, 108 gm protein, 1180mL free water Monitor fluid status, patient is up 17 pounds since admission. Noted edema per NSG assessment.  NUTRITION DIAGNOSIS:   Severe Malnutrition related to chronic illness(pancreatitis s/p resection, hx EtOH abuse, stg III colon cancer s/p resection) as evidenced by severe fat depletion, severe muscle depletion-ongoing  GOAL:   Provide needs based on ASPEN/SCCM guidelines -progressing  MONITOR:   Vent status, Labs, Weight trends, TF tolerance, Skin, I & O's  REASON FOR ASSESSMENT:   Ventilator, Consult Enteral/tube feeding initiation and management  ASSESSMENT:   57 year old male with PMHx of DM type 2, BPH, pancreatitis s/p resection of pancreas, GERD, hx EtOH abuse, stage IIIb colon cancer s/p laparoscopic low anterior resection on 04/17/2017, recent MRSA Port-A-Cath with bacteremia s/p removal on 05/29/2017 who presented with AMS and was found to be profoundly hypoglycemic, was emergently intubated.   12/26 Severe Diarrhea per CCM, pancreatic enzymes initiated and TF reduced 12/29 Extubated 12/31 Re-intubated to protect airway Possible trach/PEG, awaiting transfer to [facility], no beds today.  Patient is currently intubated on ventilator support MV: 9.4 L/min Temp (24hrs), Avg:97.3 F (36.3 C), Min:94.1 F (34.5 C), Max:99.5 F (37.5 C) Propofol: None   NGT tip to stomach  Medications reviewed and include:  Colace, Insulin, Creon, MVI liquid lacosamide gtt Fentanyl gtt Levo gtt  Labs reviewed:  CBGs 174, 120, 156 K+ 3.3, Phos and Mg WNL   Diet Order:  No diet orders on file  EDUCATION NEEDS:   No education needs have been identified at this time  Skin:  Skin Assessment: Skin Integrity Issues: Skin Integrity Issues:: Stage II Stage II: To  Coccyx Incisions: closed incisions to right chest and rectum  Last BM:  12/29 Type 6  Height:   Ht Readings from Last 1 Encounters:  06/23/17 5\' 10"  (1.778 m)    Weight:   Wt Readings from Last 1 Encounters:  06/30/17 130 lb 1.1 oz (59 kg)    Ideal Body Weight:  75.5 kg  BMI:  Body mass index is 18.66 kg/m.  Estimated Nutritional Needs:   Kcal:  1715 (PSU 2003b w/ MSJ 1373, Ve 9.4, Tmax 37.5)  Protein:  80-95 grams (1.5-1.8 grams/kg)  Fluid:  1.6-1.9 L/day (30-35 mL/kg)  Satira Anis. Sowmya Partridge, MS, RD LDN Inpatient Clinical Dietitian Pager [phone]

## 2017-07-01 NOTE — Progress Notes (Signed)
   07/01/17 0430  Vitals  Temp (!) 95.4 F (35.2 C)  Temp Source Rectal   Warm blankets applied.

## 2017-07-01 NOTE — Progress Notes (Signed)
Pharmacy Antibiotic Note  [name] is a 57 y.o. male admitted on 06/22/2017 with pneumonia.  Pharmacy has been consulted for vancomycin dosing. Patient has extensive past medical history including recent history for MRSA bacteremia; patient did not complete outpatient regimen. Patient being treated from presumed aspiration PNA (cefepime transitioned to Zosyn on 12/31) and vancomycin being added due to increased WBC and pressor requirement.   Plan: Vancomycin 1250mg  IV x 1 followed by vanomcyin 1g IV Q12hr for goal trough of 15-20. Will obtain BMP with am labs. Will obtain trough as warranted. CXR scheduled for am.   Height: 5\' 10"  (177.8 cm) Weight: 130 lb 1.1 oz (59 kg) IBW/kg (Calculated) : 73  Temp (24hrs), Avg:97 F (36.1 C), Min:94.1 F (34.5 C), Max:98.8 F (37.1 C)  Recent Labs  Lab 06/25/17 0415 06/26/17 0322 06/26/17 0510 06/27/17 0417 06/28/17 0526 06/29/17 0457 07/01/17 1045  WBC 14.8*  --  18.6* 23.3* 20.1* 12.0* 36.9*  CREATININE 0.81 0.65  --  0.84  --  0.75 0.79    Estimated Creatinine Clearance: 85 mL/min (by C-G formula based on SCr of 0.79 mg/dL).    Allergies  Allergen Reactions  . Aspirin Itching and Other (See Comments)    Reaction: abdominal pain    Antimicrobials this admission: Cefepime 12/27 >> 12/31 Vancomycin 12/27 >> 12/27, 1/2 >> Zosyn 12/31 >>  Dose adjustments this admission: N/A  Microbiology results: 12/24 BCx: no growth x 5 days  12/24 UCx: 20K Yeast  12/26 Sputum: consistent with respiratory flora  12/24 MRSA PCR: negative  12/26 CDiff: negative  12/31 Tracheal Aspirate: reincubated for better growth   Thank you for allowing pharmacy to be a part of this patient's care.  Simpson,Michael L 07/01/2017 3:35 PM

## 2017-07-01 NOTE — Progress Notes (Signed)
[place] INFECTIOUS DISEASE PROGRESS NOTE Date of Admission:  06/22/2017     ID: [name] is a 57 y.o. male with aspiration pna Active Problems:   Acute respiratory failure with hypoxia (HCC)   Protein-calorie malnutrition, severe   Altered mental status   Pressure injury of skin   Subjective: Remains intubated, WBC up, back on pressors and hypothermic. RN reports family continues to lower head of bed and turn his neck to side to reposition him and she is concerned he is aspirating. No dirarhea.   ROS  Unable to obtain  Medications:  Antibiotics Given (last 72 hours)    Date/Time Action Medication Dose Rate   06/28/17 2040 New Bag/Given   ceFEPIme (MAXIPIME) 2 g in dextrose 5 % 50 mL IVPB 2 g 100 mL/hr   06/29/17 0419 New Bag/Given   ceFEPIme (MAXIPIME) 2 g in dextrose 5 % 50 mL IVPB 2 g 100 mL/hr   06/29/17 1255 New Bag/Given   ceFEPIme (MAXIPIME) 2 g in dextrose 5 % 50 mL IVPB 2 g 100 mL/hr   06/29/17 1500 New Bag/Given   piperacillin-tazobactam (ZOSYN) IVPB 3.375 g 3.375 g 12.5 mL/hr   06/29/17 2230 New Bag/Given   piperacillin-tazobactam (ZOSYN) IVPB 3.375 g 3.375 g 12.5 mL/hr   06/30/17 3704 New Bag/Given   piperacillin-tazobactam (ZOSYN) IVPB 3.375 g 3.375 g 12.5 mL/hr   06/30/17 1459 New Bag/Given   piperacillin-tazobactam (ZOSYN) IVPB 3.375 g 3.375 g 12.5 mL/hr   06/30/17 2124 New Bag/Given   piperacillin-tazobactam (ZOSYN) IVPB 3.375 g 3.375 g 12.5 mL/hr   07/01/17 0541 New Bag/Given   piperacillin-tazobactam (ZOSYN) IVPB 3.375 g 3.375 g 12.5 mL/hr     . chlorhexidine gluconate (MEDLINE KIT)  15 mL Mouth Rinse BID  . docusate  100 mg Per Tube BID  . enoxaparin (LOVENOX) injection  40 mg Subcutaneous Daily  . famotidine  20 mg Per Tube Daily  . insulin aspart  0-9 Units Subcutaneous Q4H  . ipratropium-albuterol  3 mL Nebulization Q6H  . lipase/protease/amylase  36,000 Units Oral Q6H  . magnesium oxide  400 mg Per Tube BID  . mouth rinse  15 mL Mouth  Rinse QID  . multivitamin  15 mL Per Tube Daily  . potassium chloride  40 mEq Per Tube Once  . QUEtiapine  25 mg Per Tube QHS    Objective: Vital signs in last 24 hours: Temp:  [94.1 F (34.5 C)-99.1 F (37.3 C)] 95.4 F (35.2 C) (01/02 1100) Pulse Rate:  [86-110] 106 (01/02 1100) Resp:  [15-21] 17 (01/02 0900) BP: (71-127)/(52-80) 108/69 (01/02 1100) SpO2:  [92 %-100 %] 97 % (01/02 1435) FiO2 (%):  [35 %-50 %] 50 % (01/02 1435) Constitutional: intubated, sedated, does seem to open eyes to voice and gentle sternal rub. Thin, chronically ill appearing HENT: anicteric Mouth/Throat: ETT  Cardiovascular: Normal rate, regular rhythm and normal heart sounds.  Access - L neck IJ, R prior Portacath site fulled healed Pulmonary/Chest:mech breath sounds, extensive rhonchi Abdominal: Soft. Bowel sounds are normal. He exhibits no distension. There is no tenderness.  Lymphadenopathy: He has no cervical adenopathy.  Neurological: sedated, Skin: Skin is warm and dry. No rash noted. No erythema.  Psychiatric:unable to obtain Ext - no edema    Lab Results Recent Labs    06/29/17 0457 07/01/17 1045  WBC 12.0* 36.9*  HGB 7.7* 7.4*  HCT 23.2* 23.2*  NA 138 139  K 3.7 3.3*  CL 104 102  CO2 28 29  BUN 12 16  CREATININE 0.75 0.79    Microbiology: Results for orders placed or performed during the hospital encounter of 06/22/17  Blood Culture (routine x 2)     Status: None   Collection Time: 06/22/17  1:34 PM  Result Value Ref Range Status   Specimen Description BLOOD LEFT ANTECUBITAL  Final   Special Requests   Final    BOTTLES DRAWN AEROBIC AND ANAEROBIC Blood Culture adequate volume   Culture   Final    NO GROWTH 5 DAYS Performed at [facility], [address], [place], [place] [address]    Report Status 06/27/2017 FINAL  Final  Blood Culture (routine x 2)     Status: None   Collection Time: 06/22/17  1:34 PM  Result Value Ref Range Status   Specimen Description  BLOOD LEFT ANTECUBITAL  Final   Special Requests   Final    BOTTLES DRAWN AEROBIC AND ANAEROBIC Blood Culture adequate volume   Culture   Final    NO GROWTH 5 DAYS Performed at [facility], [address] [place], [place] [address]    Report Status 06/27/2017 FINAL  Final  Urine culture     Status: Abnormal   Collection Time: 06/22/17  1:34 PM  Result Value Ref Range Status   Specimen Description   Final    URINE, RANDOM Performed at [facility], [address], [place], [place] [address]    Special Requests   Final    NONE Performed at [facility], [address], [place], [place] [address]    Culture 20,000 COLONIES/mL YEAST (A)  Final   Report Status 06/24/2017 FINAL  Final  MRSA PCR Screening     Status: None   Collection Time: 06/22/17  6:00 PM  Result Value Ref Range Status   MRSA by PCR NEGATIVE NEGATIVE Final    Comment:        The GeneXpert MRSA Assay (FDA approved for NASAL specimens only), is one component of a comprehensive MRSA colonization surveillance program. It is not intended to diagnose MRSA infection nor to guide or monitor treatment for MRSA infections. Performed at [facility], [address] [place], [place] [address]   C difficile quick scan w PCR reflex     Status: None   Collection Time: 06/24/17  5:05 AM  Result Value Ref Range Status   C Diff antigen NEGATIVE NEGATIVE Final   C Diff toxin NEGATIVE NEGATIVE Final   C Diff interpretation No C. difficile detected.  Final    Comment: Performed at [facility], [address] [place], [place] [address]  Gastrointestinal Panel by PCR , Stool     Status: None   Collection Time: 06/24/17  5:05 AM  Result Value Ref Range Status   Campylobacter species NOT DETECTED NOT DETECTED Final   Plesimonas shigelloides NOT DETECTED NOT DETECTED Final   Salmonella species NOT DETECTED NOT DETECTED Final   Yersinia enterocolitica NOT DETECTED NOT DETECTED  Final   Vibrio species NOT DETECTED NOT DETECTED Final   Vibrio cholerae NOT DETECTED NOT DETECTED Final   Enteroaggregative E coli (EAEC) NOT DETECTED NOT DETECTED Final   Enteropathogenic E coli (EPEC) NOT DETECTED NOT DETECTED Final   Enterotoxigenic E coli (ETEC) NOT DETECTED NOT DETECTED Final   Shiga like toxin producing E coli (STEC) NOT DETECTED NOT DETECTED Final   Shigella/Enteroinvasive E coli (EIEC) NOT DETECTED NOT DETECTED Final   Cryptosporidium NOT DETECTED NOT DETECTED  Final   Cyclospora cayetanensis NOT DETECTED NOT DETECTED Final   Entamoeba histolytica NOT DETECTED NOT DETECTED Final   Giardia lamblia NOT DETECTED NOT DETECTED Final   Adenovirus F40/41 NOT DETECTED NOT DETECTED Final   Astrovirus NOT DETECTED NOT DETECTED Final   Norovirus GI/GII NOT DETECTED NOT DETECTED Final   Rotavirus A NOT DETECTED NOT DETECTED Final   Sapovirus (I, II, IV, and V) NOT DETECTED NOT DETECTED Final    Comment: Performed at [facility], [address] [place], [place] [address]  Culture, respiratory (NON-Expectorated)     Status: None   Collection Time: 06/24/17  5:30 AM  Result Value Ref Range Status   Specimen Description   Final    TRACHEAL ASPIRATE Performed at [facility], [address] [place], [place] [address]    Special Requests   Final    NONE Performed at [facility], [address] [place], [place] [address]    Gram Stain   Final    RARE WBC PRESENT, PREDOMINANTLY PMN MODERATE GRAM NEGATIVE RODS RARE YEAST    Culture   Final    MODERATE Consistent with normal respiratory flora. Performed at [facility], [place] [address] [place], [place] [address]    Report Status 06/26/2017 FINAL  Final  Culture, respiratory (NON-Expectorated)     Status: None (Preliminary result)   Collection Time: 06/29/17  1:44 PM  Result Value Ref Range Status   Specimen Description   Final    TRACHEAL ASPIRATE Performed at [facility], [address], [place], [place] [address]    Special Requests   Final    NONE Performed at [facility], [address] [place], [place] [address]    Gram Stain   Final    ABUNDANT WBC PRESENT,BOTH PMN AND MONONUCLEAR NO ORGANISMS SEEN    Culture   Final    CULTURE REINCUBATED FOR BETTER GROWTH Performed at [facility], [place] [address], [place], [place] [address]    Report Status PENDING  Incomplete    Studies/Results: No results found.  Assessment/Plan: LINH JOHANNES is a 57 y.o. male with extensive PMH including recent admission for MRSA bacteremia associated with infected R portacath, inadequately treated after DC due to social issues, now readmitted with AMS, hypoglycemia, hypotension and respiratory failure..  He has stage III rectal adenocarcinoma status post laparoscopic LAR but he missed his fu chemo appointments and is outside window for adjunctive chemo per Oncology.  He has a long history of alcohol abuse, chronic pancreatitis and brittle poorly controlled diabetes. 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 He likely has Aspiration PNA.   He has neg BCX and neg Sputum cx. Surprisingly cleared his MRSA bacteremia despite not getting his Otpt vancomycin. 07/01/17 - wbc up to 39, hypothermic and back on pressors. Thick sputum as well and concern for aspiration due to family repositioning him.   Recommendations Extend  zosyn and add vanco Check sputum cx and cxr. If having diarrhea check C diff Repeat CBC in Am Thank you very much for the consult. Will follow with you.  Leonel Ramsay   07/01/2017, 2:43 PM

## 2017-07-01 NOTE — Progress Notes (Signed)
MEDICATION RELATED CONSULT NOTE - Electrolyte management    Pharmacy consulted for electrolyte management for 57 yo male ICU patient requiring mechanical intubation. Goal Potassium ~4, goal magnesium ~ 2.   Plan:  Will order potassium 82mEq PO x 1. Will recheck electrolytes with am labs.    Allergies  Allergen Reactions  . Aspirin Itching and Other (See Comments)    Reaction: abdominal pain    Patient Measurements: Height: 5\' 10"  (177.8 cm) Weight: 130 lb 1.1 oz (59 kg) IBW/kg (Calculated) : 73   Vital Signs: Temp: 101.1 F (38.4 C) (01/02 2200) Temp Source: Rectal (01/02 1200) BP: 98/62 (01/02 2200) Pulse Rate: 104 (01/02 2200) Intake/Output from previous day: 01/01 0701 - 01/02 0700 In: 1475.3 [I.V.:200.3; NG/GT:760; IV Piggyback:345] Out: 2660 [Urine:1685; Emesis/NG output:975] Intake/Output from this shift: No intake/output data recorded.  Labs: Recent Labs    06/29/17 0457 07/01/17 1045  WBC 12.0* 36.9*  HGB 7.7* 7.4*  HCT 23.2* 23.2*  PLT 133* 277  CREATININE 0.75 0.79  MG 1.6* 1.9  PHOS 4.0  --    Estimated Creatinine Clearance: 85 mL/min (by C-G formula based on SCr of 0.79 mg/dL).   Pharmacy will continue to monitor and adjust per consult.   Mateya Torti L 07/01/2017,10:45 PM

## 2017-07-01 NOTE — Progress Notes (Signed)
   07/01/17 0500  Vitals  Temp (!) 94.1 F (34.5 C)  Temp Source Rectal    Bair hugger applied

## 2017-07-01 NOTE — Progress Notes (Signed)
Sats 97&, decreased to 40% fio2

## 2017-07-01 NOTE — Progress Notes (Signed)
PULMONARY / CRITICAL CARE MEDICINE   Name: [name] MRN: [id number] DOB: 03/09/1961    ADMISSION DATE:  06/22/2017 CONSULTATION DATE:  12/24  REFERRING MD: Margaretmary Eddy  PT PROFILE:   54 M with hx of recently resected colon cancer (04/17/17 at [facility]) and recent MRSA Port-A-Cath infection with bacteremia brought to [facility] ED by EMS for altered cognition and intubated in the ED for same.  Was found to be profoundly hypoglycemic by EMS.  Received dextrose without improvement in mental status.  Also received naloxone without improvement in cognition.   MAJOR EVENTS/TEST RESULTS: 12/24 admission via ED as noted above.  Transfused 1 unit PRBCs in the ED for hemoglobin 7.3.  Bicarbonate infusion initiated for profound hyperchloremic acidosis 12/24 CT head: No acute findings 12/24 CTAP: No acute findings in the abdomen or pelvis.  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 Rec continued HCO3 infusion 12/25 Persistent coma despite no sedatives.  12/25Neurology consultation: Concern for possible seizure. Keppra initiated MRI brain ordered 12/26 Severe diarrhea. TF rate reduced and pancreatic enzymes initiated 12/26 more responsive with some spontaneous movement but not F/C. Fentanyl infusion initiated, then DC's. Intermittent fentanyl/midaz 12/26 MRI brain: Borderline FLAIR hyperintensity/edema throughout the cerebral cortex, but no visible swelling on the other sequences. This could be related to technique, global injury, or seizure phenomenon 12/26 EEG: This is an abnormal electroencephalogram secondary to a slow, unreactive, discontinuous background.  This finding is nonspecific and may be seen in many clinical scenarios.  No epileptiform activity is noted 12/27 increased psychomotor agitation.  Nonpurposeful.  Increased purulent respiratory secretions. 12/28 remains nonpurposeful with intermittent psychomotor agitation requiring  intermittent fentanyl and midazolam 12/29 extubated 12/31 reintubated  AWAITING TRANSFER TO [facility]  INDWELLING DEVICES:: ETT 12/24 >>  L IJ CVL 12/24 >>   MICRO DATA: Urine 12/24 >> 20k cfu yeast Resp 12/24 >> c/w NOF Blood 12/24 >> NEG C diff PCR 12/26 >> NEG GI panel 12/26 >> NEG  ANTIMICROBIALS:  Pip-tazo 12/24 >> 12/26 Vancomycin 12/24 >> 12/28 Cefepime 12/27 >>   SUBJECTIVE:  Remains intubated Sedated On vent support [facility] transfer initiated per family request Remains critically ill    VITAL SIGNS: BP (!) 82/59   Pulse (!) 101   Temp (!) 97.2 F (36.2 C) (Rectal)   Resp 17   Ht 5\' 10"  (1.778 m)   Wt 130 lb 1.1 oz (59 kg)   SpO2 95%   BMI 18.66 kg/m   HEMODYNAMICS:    VENTILATOR SETTINGS: Vent Mode: PRVC FiO2 (%):  [35 %] 35 % Set Rate:  [15 bmp] 15 bmp Vt Set:  [500 mL] 500 mL PEEP:  [5 cmH20] 5 cmH20  INTAKE / OUTPUT: I/O last 3 completed shifts: In: 1835.8 [I.V.:370.8; Other:170; NG/GT:900; IV Piggyback:395] Out: 3760 [Urine:2785; Emesis/NG output:975]  PHYSICAL EXAMINATION: General: sedated,intubated Neuro:GCS8T HEENT: NCAT, sclerae white Cardiovascular: Regular, no M Lungs: few rhonchi Abdomen:  + bowel sounds, no palpable masses Ext: warm, no edema  LABS:  BMET Recent Labs  Lab 06/26/17 0322 06/27/17 0417 06/29/17 0457  NA 138 139 138  K 4.0 4.7 3.7  CL 109 108 104  CO2 26 27 28   BUN 10 16 12   CREATININE 0.65 0.84 0.75  GLUCOSE 146* 201* 139*    Electrolytes Recent Labs  Lab 06/25/17 0415 06/26/17 0322 06/27/17 0417 06/28/17 0526 06/29/17 0457  CALCIUM 7.8* 7.9* 8.1*  --  8.2*  MG 1.9  --   --  1.8 1.6*  PHOS 3.2  --   --  4.6 4.0    CBC Recent Labs  Lab 06/27/17 0417 06/28/17 0526 06/29/17 0457  WBC 23.3* 20.1* 12.0*  HGB 9.1* 8.3* 7.7*  HCT 28.8* 25.5* 23.2*  PLT 96* 110* 133*    Coag's No results for input(s): APTT, INR in the last 168 hours.  Sepsis Markers Recent Labs  Lab 06/29/17 0457  06/30/17 0613  PROCALCITON 4.07 2.80    ABG Recent Labs  Lab 06/29/17 1202  PHART 7.47*  PCO2ART 40  PO2ART 80*    Liver Enzymes Recent Labs  Lab 06/27/17 0417  AST 26  ALT 16*  ALKPHOS 133*  BILITOT 0.6  ALBUMIN 1.7*    Cardiac Enzymes No results for input(s): TROPONINI, PROBNP in the last 168 hours.  Glucose Recent Labs  Lab 06/30/17 0820 06/30/17 1239 06/30/17 1601 06/30/17 2003 07/01/17 0034 07/01/17 0441  GLUCAP 78 165* 122* 172* 156* 120*    CXR: NSC RLL infiltrate and effusion  ASSESSMENT / PLAN:  PULMONARY Extubated-and then reintubated unable to protect airway On vent Oxygen as needed Will need trach/PEG tube for survival Neuro prognosis is poor Family wants second opinion awaiting [facility] transfer  CARDIOVASCULAR SD monitoring  RENAL Follow [facility]  GASTROINTESTINAL A:   Recent bowel resection for colon cancer Chronic pancreatitis with severe pancreatic insufficiency Severe protein-calorie malnutrition Severe diarrhea - improved with decrease in TF rate Dysphagia due to encephalopathy NGT placed-continue TF's  HEMATOLOGIC A:   Acute on chronic anemia without overt blood loss Thrombocytopenia  P:  DVT px: SCDs Monitor CBC intermittently Transfuse per usual guidelines   INFECTIOUS A:   RLL HCAP P:   Monitor temp, WBC count Micro and abx as above   ENDOCRINE A:   Type 2 diabetes Severe hypoglycemia, resolved Hyperglycemia, controlled P:   Monitor CBGs  Continue sensitive scale SSI - initiated 12/26  NEUROLOGIC A:   Acute encephalopathy - suspect hypoglycemic brain injury sedated   Critical Care Time devoted to patient care services described in this note is 33 minutes.   Overall, patient is critically ill, prognosis is guarded.  Patient with Multiorgan failure and at high risk for cardiac arrest and death.    Corrin Parker, M.D.  [facility]  Medical Director [place] Director [facility] Cardio-Pulmonary Department

## 2017-07-01 NOTE — Progress Notes (Signed)
   07/01/17 1800  Vitals  Temp (!) 101.3 F (38.5 C)  BP 97/62  MAP (mmHg) 74  Pulse Rate (!) 111  Warming blanket turned off

## 2017-07-02 LAB — GLUCOSE, CAPILLARY
GLUCOSE-CAPILLARY: 253 mg/dL — AB (ref 65–99)
GLUCOSE-CAPILLARY: 150 mg/dL — AB (ref 65–99)
GLUCOSE-CAPILLARY: 109 mg/dL — AB (ref 65–99)
GLUCOSE-CAPILLARY: 232 mg/dL — AB (ref 65–99)

## 2017-07-02 LAB — BASIC METABOLIC PANEL
ANION GAP: 7 (ref 5–15)
BUN: 16 mg/dL (ref 6–20)
CO2: 30 mmol/L (ref 22–32)
Calcium: 8 mg/dL — ABNORMAL LOW (ref 8.9–10.3)
Chloride: 102 mmol/L (ref 101–111)
Creatinine, Ser: 0.8 mg/dL (ref 0.61–1.24)
GFR calc Af Amer: >60 mL/min (ref >60)
Glucose, Bld: 263 mg/dL — ABNORMAL HIGH (ref 65–99)
POTASSIUM: 3.8 mmol/L (ref 3.5–5.1)
SODIUM: 139 mmol/L (ref 135–145)

## 2017-07-02 LAB — CULTURE, RESPIRATORY

## 2017-07-02 MED ORDER — SODIUM CHLORIDE 0.9% FLUSH: 10.0000 mL | INTRAVENOUS | Status: DC | PRN

## 2017-07-02 MED ORDER — FAMOTIDINE 20 MG PO TABS
20.0000 mg | ORAL_TABLET | Freq: Two times a day (BID) | ORAL | Status: DC
  Administered 2017-07-02: 20 mg
  Filled 2017-07-02: qty 1

## 2017-07-02 NOTE — Progress Notes (Signed)
Verbal report given to [facility] RN and transport Daleen Snook with Ulice Dash present during conversation. [place] care present at bedside with face to face report given as well as transfer paperwork and imaging CD handed to Philomena Course, Therapist, sports.

## 2017-07-02 NOTE — Progress Notes (Signed)
RN left a message for mother of pt to contact RN about transfer information. RN also called sister and message system was not set up to leave information to call back.

## 2017-07-02 NOTE — Progress Notes (Signed)
[facility] [place] NAME: [name]    MR#:  [id number]  DATE OF BIRTH:  1961-05-04  SUBJECTIVE:  The patient appears critically ill he remains intubated on the ventilator.  Not able to wean off the ventilator.  REVIEW OF SYSTEMS:   Review of Systems  Unable to perform ROS: Intubated   Tolerating Diet: Tube feeding  DRUG ALLERGIES:   Allergies  Allergen Reactions  . Aspirin Itching and Other (See Comments)    Reaction: abdominal pain    VITALS:  Blood pressure 98/60, pulse (!) 105, temperature 99.7 F (37.6 C), resp. rate 10, height 5\' 10"  (1.778 m), weight 59 kg (130 lb 1.1 oz), SpO2 99 %.  PHYSICAL EXAMINATION:   Physical Exam  GENERAL:  57 y.o.-year-old patient lying in the bed with no acute distress.  Thin and cachectic EYES: Pupils equal, round, reactive to light and accommodation. No scleral icterus HEENT: Head atraumatic, normocephalic.  Intubated sedated on the vent NECK:  Supple, no jugular venous distention. No thyroid enlargement, no tenderness.  LUNGS: Coarse breath sounds bilaterally, no wheezing, rales, rhonchi. No use of accessory muscles of respiration.  CARDIOVASCULAR: S1, S2 normal. No murmurs, rubs, or gallops.  ABDOMEN: Soft, nontender, nondistended. Bowel sounds present. No organomegaly or mass.  EXTREMITIES: No cyanosis, clubbing or edema b/l.    NEUROLOGIC: Intubated sedated PSYCHIATRIC: Sedated on the vent SKIN: No obvious rash, lesion, or ulcer.   LABORATORY PANEL:  CBC Recent Labs  Lab 07/01/17 1045  WBC 36.9*  HGB 7.4*  HCT 23.2*  PLT 277    Chemistries  Recent Labs  Lab 06/27/17 0417  07/01/17 1045 07/02/17 0439  NA 139   < > 139 139  K 4.7   < > 3.3* 3.8  CL 108   < > 102 102  CO2 27   < > 29 30  GLUCOSE 201*   < > 158* 263*  BUN 16   < > 16 16  CREATININE 0.84   < > 0.79 0.80  CALCIUM 8.1*   < > 8.1* 8.0*  MG  --    < > 1.9  --   AST 26  --   --   --   ALT 16*  --    --   --   ALKPHOS 133*  --   --   --   BILITOT 0.6  --   --   --    < > = values in this interval not displayed.   Cardiac Enzymes No results for input(s): TROPONINI in the last 168 hours. RADIOLOGY:  Dg Chest Port 1 View  Result Date: 07/01/2017 CLINICAL DATA:  Pulmonary infiltrates.  Cough. EXAM: PORTABLE CHEST 1 VIEW COMPARISON:  06/29/2017 and 06/18/2017 FINDINGS: Endotracheal tube, NG tube, and central venous catheter all appear in good position. There is progressive consolidation in the right upper lobe. Increased density at the right base probably represents a combination of effusion and consolidation. New slight haziness at the left lung base. Heart size and vascularity are normal. IMPRESSION: 1. Progressive infiltrates on the right with a probable new right effusion. 2. New hazy infiltrate at the left lung base. Electronically Signed   By: Lorriane Shire M.D.   On: 07/01/2017 15:41   ASSESSMENT AND PLAN:  57 year old male with recent resected colon cancer who is brought to the emergency room due to altered mental status  1. Acute hypoxic respiratory failure due to hypoglycemic coma  and right lower lobe HCAP:  patient extubated on 06/28/2017 however reintubated 06/29/2017 for inability to protect airway Management as per intensivist   2. RLL aspiration pneumonia: Cefepime changed to Zosyn for 8 days total (2/8)as per ID consult.  3. Acute encephalopathy, metabolic in nature due to prolonged hypoglycemia -Suspect hypoglycemic brain injury -MRI of the brain reviewed and shows prominence of the cerebral cortex on flair. -EEG slow but without evidence of seizure activity  -Keppra changed to Vimpat to rule out encephalopathy by neurologist  -Consultation with neurology appreciated  4. History colon cancer status post colon resection  5. Severe protein calorie malnutrition on tube feeds  6. Acute on chronic anemia chronic disease: Hemoglobin stable  7. Type 2 diabetes  with severe hypoglycemia which is resolved: Continue sliding scale  8. Chronic anemia: Hemoglobin remained stable between 7.5-9 No need for transfusion  Patient will likely need trach and PEG Family wants second opinion.Awaiting bed at [facility] for transfer  Overall prognosis is poor CODE STATUS: FULL  DVT Prophylaxis: lovenox  TOTAL TIME TAKING CARE OF THIS PATIENT: 25 minutes.  >50% time spent on counselling and coordination of care    Note: This dictation was prepared with Dragon dictation along with smaller phrase technology. Any transcriptional errors that result from this process are unintentional.  Fritzi Mandes M.D on 07/02/2017 at 2:08 PM  Between 7am to 6pm - Pager - [phone]  After 6pm go to [email or web address] - Proofreader  [facility]  Office  [phone]  CC: Primary care physician; [name], MDPatient ID: [name], male   DOB: July 14, 1960, 57 y.o.   MRN: [id number]

## 2017-07-02 NOTE — Progress Notes (Signed)
Inpatient Diabetes Program Recommendations  AACE/ADA: New Consensus Statement on Inpatient Glycemic Control (2015)  Target Ranges:  Prepandial:   less than 140 mg/dL      Peak postprandial:   less than 180 mg/dL (1-2 hours)      Critically ill patients:  140 - 180 mg/dL   Lab Results  Component Value Date   GLUCAP 150 (H) 07/02/2017   HGBA1C 11.8 (H) 05/14/2015    Review of Glycemic Control  Results for DONNELL, WION (MRN [id number]) as of 07/02/2017 09:11  Ref. Range 07/01/2017 16:32 07/01/2017 20:06 07/01/2017 23:50 07/02/2017 04:15 07/02/2017 07:22  Glucose-Capillary Latest Ref Range: 65 - 99 mg/dL 209 (H) 164 (H) 228 (H) 253 (H) 150 (H)    Diabetes history: Type 2  Outpatient Diabetes medications: Lantus 10 units qhs, Novolog 48 units tid  Current orders for Inpatient glycemic control: Novolog 0-9 units q4h  Inpatient Diabetes Program Recommendations: Agree with current medications for blood sugar management.   Gentry Fitz, RN, BA, MHA, CDE Diabetes Coordinator Inpatient Diabetes Program  [phone] (Team Pager) [phone] ([employer]) 07/02/2017 9:17 AM

## 2017-07-02 NOTE — Progress Notes (Signed)
PULMONARY / CRITICAL CARE MEDICINE   Name: VERNIE PIET MRN: [id number] DOB: 03-09-1961    ADMISSION DATE:  06/22/2017 CONSULTATION DATE:  12/24  REFERRING MD: Margaretmary Eddy  PT PROFILE:   25 M with hx of recently resected colon cancer (04/17/17 at [facility]) and recent MRSA Port-A-Cath infection with bacteremia brought to [facility] ED by EMS for altered cognition and intubated in the ED for same.  Was found to be profoundly hypoglycemic by EMS.  Received dextrose without improvement in mental status.  Also received naloxone without improvement in cognition.   MAJOR EVENTS/TEST RESULTS: 12/24 admission via ED as noted above.  Transfused 1 unit PRBCs in the ED for hemoglobin 7.3.  Bicarbonate infusion initiated for profound hyperchloremic acidosis 12/24 CT head: No acute findings 12/24 CTAP: No acute findings in the abdomen or pelvis.  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 Rec continued HCO3 infusion 12/25 Persistent coma despite no sedatives.  12/25Neurology consultation: Concern for possible seizure. Keppra initiated MRI brain ordered 12/26 Severe diarrhea. TF rate reduced and pancreatic enzymes initiated 12/26 more responsive with some spontaneous movement but not F/C. Fentanyl infusion initiated, then DC's. Intermittent fentanyl/midaz 12/26 MRI brain: Borderline FLAIR hyperintensity/edema throughout the cerebral cortex, but no visible swelling on the other sequences. This could be related to technique, global injury, or seizure phenomenon 12/26 EEG: This is an abnormal electroencephalogram secondary to a slow, unreactive, discontinuous background.  This finding is nonspecific and may be seen in many clinical scenarios.  No epileptiform activity is noted 12/27 increased psychomotor agitation.  Nonpurposeful.  Increased purulent respiratory secretions. 12/28 remains nonpurposeful with intermittent psychomotor agitation requiring  intermittent fentanyl and midazolam 12/29 extubated 12/31 reintubated 1/2 increased WBC  AWAITING TRANSFER TO [facility]  INDWELLING DEVICES:: ETT 12/24 >>  L IJ CVL 12/24 >>   MICRO DATA: Urine 12/24 >> 20k cfu yeast Resp 12/24 >> c/w NOF Blood 12/24 >> NEG C diff PCR 12/26 >> NEG GI panel 12/26 >> NEG  ANTIMICROBIALS:  Pip-tazo 12/24 >> 12/26 Vancomycin 12/24 >> 12/28 Cefepime 12/27 >>   SUBJECTIVE:  Remains intubated Sedated On vent support [facility] transfer initiated per family request Remains critically ill     VITAL SIGNS: BP 90/62   Pulse (!) 101   Temp 98.4 F (36.9 C)   Resp 15   Ht 5\' 10"  (1.778 m)   Wt 130 lb 1.1 oz (59 kg)   SpO2 95%   BMI 18.66 kg/m   HEMODYNAMICS:    VENTILATOR SETTINGS: Vent Mode: PRVC FiO2 (%):  [35 %-50 %] 40 % Set Rate:  [15 bmp] 15 bmp Vt Set:  [500 mL] 500 mL PEEP:  [5 cmH20] 5 cmH20 Pressure Support:  [5 cmH20] 5 cmH20 Plateau Pressure:  [12 cmH20-23 cmH20] 20 cmH20  INTAKE / OUTPUT: I/O last 3 completed shifts: In: 2624.8 [I.V.:440.8; Other:170; WG/YK:5993; IV Piggyback:620] Out: 2000 [Urine:1900; Emesis/NG output:100]  PHYSICAL EXAMINATION: General: sedated,intubated Neuro:GCS8T HEENT: NCAT, sclerae white Cardiovascular: Regular, no M Lungs: few rhonchi Abdomen:  + bowel sounds, no palpable masses Ext: warm, no edema  LABS:  BMET Recent Labs  Lab 06/29/17 0457 07/01/17 1045 07/02/17 0439  NA 138 139 139  K 3.7 3.3* 3.8  CL 104 102 102  CO2 28 29 30   BUN 12 16 16   CREATININE 0.75 0.79 0.80  GLUCOSE 139* 158* 263*    Electrolytes Recent Labs  Lab 06/28/17 0526 06/29/17 0457 07/01/17 1045 07/02/17 0439  CALCIUM  --  8.2* 8.1* 8.0*  MG 1.8 1.6* 1.9  --   PHOS 4.6 4.0  --   --     CBC Recent Labs  Lab 06/28/17 0526 06/29/17 0457 07/01/17 1045  WBC 20.1* 12.0* 36.9*  HGB 8.3* 7.7* 7.4*  HCT 25.5* 23.2* 23.2*  PLT 110* 133* 277    Coag's No results for input(s): APTT, INR in the last  168 hours.  Sepsis Markers Recent Labs  Lab 06/29/17 0457 06/30/17 0613  PROCALCITON 4.07 2.80    ABG Recent Labs  Lab 06/29/17 1202  PHART 7.47*  PCO2ART 40  PO2ART 80*    Liver Enzymes Recent Labs  Lab 06/27/17 0417  AST 26  ALT 16*  ALKPHOS 133*  BILITOT 0.6  ALBUMIN 1.7*    Cardiac Enzymes No results for input(s): TROPONINI, PROBNP in the last 168 hours.  Glucose Recent Labs  Lab 07/01/17 1212 07/01/17 1632 07/01/17 2006 07/01/17 2350 07/02/17 0415 07/02/17 0722  GLUCAP 143* 209* 164* 228* 253* 150*    CXR: NSC RLL infiltrate and effusion  ASSESSMENT / PLAN: 57 yo AAM with multiple medical issues admitted for severe encephalopathy leading to resp failure from acute and severe neuroglycopenia  PULMONARY Extubated-and then reintubated unable to protect airway On vent Oxygen as needed Will need trach/PEG tube for survival Neuro prognosis is poor Family wants second opinion awaiting [facility] transfer  GASTROINTESTINAL A:   Recent bowel resection for colon cancer Chronic pancreatitis with severe pancreatic insufficiency Severe protein-calorie malnutrition Severe diarrhea - improved with decrease in TF rate Dysphagia due to encephalopathy NGT placed-continue TF's   INFECTIOUS A:   RLL HCAP P:   Monitor temp, WBC count Micro and abx as above  ID following   NEUROLOGIC A:   Acute encephalopathy - suspect hypoglycemic brain injury Sedated Appreciate NEURO recs   Critical Care Time devoted to patient care services described in this note is 36 minutes.   Overall, patient is critically ill, prognosis is guarded.  Patient with Multiorgan failure and at high risk for cardiac arrest and death.    Corrin Parker, M.D.  [facility]  Medical Director [place] Director [facility] Cardio-Pulmonary Department

## 2017-07-02 NOTE — Progress Notes (Signed)
Gar[place] II INFECTIOUS DISEASE PROGRESS NOTE Date of Admission:  06/22/2017     ID: [name] is a 57 y.o. male with aspiration pna Active Problems:   Acute respiratory failure with hypoxia (HCC)   Protein-calorie malnutrition, severe   Altered mental status   Pressure injury of skin   Subjective: Remains intubated, Remains on  Pressors CXR showed recurrent PNA.  ROS  Unable to obtain  Medications:  Antibiotics Given (last 72 hours)    Date/Time Action Medication Dose Rate   06/29/17 2230 New Bag/Given   piperacillin-tazobactam (ZOSYN) IVPB 3.375 g 3.375 g 12.5 mL/hr   06/30/17 4680 New Bag/Given   piperacillin-tazobactam (ZOSYN) IVPB 3.375 g 3.375 g 12.5 mL/hr   06/30/17 1459 New Bag/Given   piperacillin-tazobactam (ZOSYN) IVPB 3.375 g 3.375 g 12.5 mL/hr   06/30/17 2124 New Bag/Given   piperacillin-tazobactam (ZOSYN) IVPB 3.375 g 3.375 g 12.5 mL/hr   07/01/17 0541 New Bag/Given   piperacillin-tazobactam (ZOSYN) IVPB 3.375 g 3.375 g 12.5 mL/hr   07/01/17 0659 New Bag/Given   vancomycin (VANCOCIN) 1,250 mg in sodium chloride 0.9 % 250 mL IVPB 1,250 mg 166.7 mL/hr   07/01/17 1507 New Bag/Given   piperacillin-tazobactam (ZOSYN) IVPB 3.375 g 3.375 g 12.5 mL/hr   07/01/17 2115 New Bag/Given   piperacillin-tazobactam (ZOSYN) IVPB 3.375 g 3.375 g 12.5 mL/hr   07/02/17 0101 New Bag/Given   vancomycin (VANCOCIN) IVPB 1000 mg/200 mL premix 1,000 mg 200 mL/hr   07/02/17 0542 New Bag/Given   piperacillin-tazobactam (ZOSYN) IVPB 3.375 g 3.375 g 12.5 mL/hr   07/02/17 1257 New Bag/Given   vancomycin (VANCOCIN) IVPB 1000 mg/200 mL premix 1,000 mg 200 mL/hr   07/02/17 1430 New Bag/Given   piperacillin-tazobactam (ZOSYN) IVPB 3.375 g 3.375 g 12.5 mL/hr     . chlorhexidine gluconate (MEDLINE KIT)  15 mL Mouth Rinse BID  . docusate  100 mg Per Tube BID  . enoxaparin (LOVENOX) injection  40 mg Subcutaneous Daily  . famotidine  20 mg Per Tube BID  . insulin aspart  0-9 Units  Subcutaneous Q4H  . ipratropium-albuterol  3 mL Nebulization Q6H  . lipase/protease/amylase  36,000 Units Oral Q6H  . magnesium oxide  400 mg Per Tube BID  . mouth rinse  15 mL Mouth Rinse QID  . multivitamin  15 mL Per Tube Daily  . QUEtiapine  25 mg Per Tube QHS    Objective: Vital signs in last 24 hours: Temp:  [97.9 F (36.6 C)-102 F (38.9 C)] 99.5 F (37.5 C) (01/03 1435) Pulse Rate:  [87-118] 88 (01/03 1435) Resp:  [10-20] 12 (01/03 1435) BP: (77-157)/(57-80) 103/61 (01/03 1400) SpO2:  [91 %-100 %] 100 % (01/03 1435) FiO2 (%):  [40 %] 40 % (01/03 1229) Weight:  [59 kg (130 lb 1.1 oz)] 59 kg (130 lb 1.1 oz) (01/03 0425) Constitutional: intubated, sedated, does seem to open eyes to voice and gentle sternal rub. Thin, chronically ill appearing HENT: anicteric Mouth/Throat: ETT  Cardiovascular: Normal rate, regular rhythm and normal heart sounds.  Access - L neck IJ, R prior Portacath site fulled healed Pulmonary/Chest:mech breath sounds, extensive rhonchi Abdominal: Soft. Bowel sounds are normal. He exhibits no distension. There is no tenderness.  Lymphadenopathy: He has no cervical adenopathy.  Neurological: sedated, Skin: Skin is warm and dry. No rash noted. No erythema.  Psychiatric:unable to obtain Ext - no edema    Lab Results Recent Labs    07/01/17 1045 07/02/17 0439  WBC 36.9*  --  HGB 7.4*  --   HCT 23.2*  --   NA 139 139  K 3.3* 3.8  CL 102 102  CO2 29 30  BUN 16 16  CREATININE 0.79 0.80    Microbiology: Results for orders placed or performed during the hospital encounter of 06/22/17  Blood Culture (routine x 2)     Status: None   Collection Time: 06/22/17  1:34 PM  Result Value Ref Range Status   Specimen Description BLOOD LEFT ANTECUBITAL  Final   Special Requests   Final    BOTTLES DRAWN AEROBIC AND ANAEROBIC Blood Culture adequate volume   Culture   Final    NO GROWTH 5 DAYS Performed at [facility], [address],  [place], [place] [address]    Report Status 06/27/2017 FINAL  Final  Blood Culture (routine x 2)     Status: None   Collection Time: 06/22/17  1:34 PM  Result Value Ref Range Status   Specimen Description BLOOD LEFT ANTECUBITAL  Final   Special Requests   Final    BOTTLES DRAWN AEROBIC AND ANAEROBIC Blood Culture adequate volume   Culture   Final    NO GROWTH 5 DAYS Performed at [facility], [address] [place], [place] [address]    Report Status 06/27/2017 FINAL  Final  Urine culture     Status: Abnormal   Collection Time: 06/22/17  1:34 PM  Result Value Ref Range Status   Specimen Description   Final    URINE, RANDOM Performed at [facility], [address] [place], [place] [address]    Special Requests   Final    NONE Performed at [facility], [address] [place], [place] [address]    Culture 20,000 COLONIES/mL YEAST (A)  Final   Report Status 06/24/2017 FINAL  Final  MRSA PCR Screening     Status: None   Collection Time: 06/22/17  6:00 PM  Result Value Ref Range Status   MRSA by PCR NEGATIVE NEGATIVE Final    Comment:        The GeneXpert MRSA Assay (FDA approved for NASAL specimens only), is one component of a comprehensive MRSA colonization surveillance program. It is not intended to diagnose MRSA infection nor to guide or monitor treatment for MRSA infections. Performed at [facility], [address] [place], [place] [address]   C difficile quick scan w PCR reflex     Status: None   Collection Time: 06/24/17  5:05 AM  Result Value Ref Range Status   C Diff antigen NEGATIVE NEGATIVE Final   C Diff toxin NEGATIVE NEGATIVE Final   C Diff interpretation No C. difficile detected.  Final    Comment: Performed at [facility], [address] [place], [place] [address]  Gastrointestinal Panel by PCR , Stool     Status: None   Collection Time: 06/24/17  5:05 AM  Result Value Ref Range Status   Campylobacter  species NOT DETECTED NOT DETECTED Final   Plesimonas shigelloides NOT DETECTED NOT DETECTED Final   Salmonella species NOT DETECTED NOT DETECTED Final   Yersinia enterocolitica NOT DETECTED NOT DETECTED Final   Vibrio species NOT DETECTED NOT DETECTED Final   Vibrio cholerae NOT DETECTED NOT DETECTED Final   Enteroaggregative E coli (EAEC) NOT DETECTED NOT DETECTED Final   Enteropathogenic E coli (EPEC) NOT DETECTED NOT DETECTED Final   Enterotoxigenic E coli (ETEC) NOT DETECTED NOT DETECTED Final   Shiga like  toxin producing E coli (STEC) NOT DETECTED NOT DETECTED Final   Shigella/Enteroinvasive E coli (EIEC) NOT DETECTED NOT DETECTED Final   Cryptosporidium NOT DETECTED NOT DETECTED Final   Cyclospora cayetanensis NOT DETECTED NOT DETECTED Final   Entamoeba histolytica NOT DETECTED NOT DETECTED Final   Giardia lamblia NOT DETECTED NOT DETECTED Final   Adenovirus F40/41 NOT DETECTED NOT DETECTED Final   Astrovirus NOT DETECTED NOT DETECTED Final   Norovirus GI/GII NOT DETECTED NOT DETECTED Final   Rotavirus A NOT DETECTED NOT DETECTED Final   Sapovirus (I, II, IV, and V) NOT DETECTED NOT DETECTED Final    Comment: Performed at [facility], [address] [place], [place] [address]  Culture, respiratory (NON-Expectorated)     Status: None   Collection Time: 06/24/17  5:30 AM  Result Value Ref Range Status   Specimen Description   Final    TRACHEAL ASPIRATE Performed at [facility], [address] [place], [place] [address]    Special Requests   Final    NONE Performed at [facility], [address] [place], [place] [address]    Gram Stain   Final    RARE WBC PRESENT, PREDOMINANTLY PMN MODERATE GRAM NEGATIVE RODS RARE YEAST    Culture   Final    MODERATE Consistent with normal respiratory flora. Performed at [facility], [place] [address] [place], [place] [address]    Report Status 06/26/2017 FINAL  Final  Culture, respiratory  (NON-Expectorated)     Status: None   Collection Time: 06/29/17  1:44 PM  Result Value Ref Range Status   Specimen Description   Final    TRACHEAL ASPIRATE Performed at [facility], [address] [place], [place] [address]    Special Requests   Final    NONE Performed at [facility], [address] [place], [place] [address]    Gram Stain   Final    ABUNDANT WBC PRESENT,BOTH PMN AND MONONUCLEAR NO ORGANISMS SEEN Performed at [facility], [place] [address] [place], [place] [address]    Culture FEW CANDIDA ALBICANS  Final   Report Status 07/02/2017 FINAL  Final  Culture, respiratory (NON-Expectorated)     Status: None (Preliminary result)   Collection Time: 07/01/17  4:19 PM  Result Value Ref Range Status   Specimen Description   Final    TRACHEAL ASPIRATE Performed at [facility], [address], [place], [place] [address]    Special Requests   Final    NONE Performed at [facility], [address] [place], [place] [address]    Gram Stain   Final    ABUNDANT WBC PRESENT, PREDOMINANTLY PMN NO SQUAMOUS EPITHELIAL CELLS SEEN NO ORGANISMS SEEN    Culture   Final    TOO YOUNG TO READ Performed at [facility], [place] [address], [place], [place] [address]    Report Status PENDING  Incomplete    Studies/Results: Dg Chest Port 1 View  Result Date: 07/01/2017 CLINICAL DATA:  Pulmonary infiltrates.  Cough. EXAM: PORTABLE CHEST 1 VIEW COMPARISON:  06/29/2017 and 06/18/2017 FINDINGS: Endotracheal tube, NG tube, and central venous catheter all appear in good position. There is progressive consolidation in the right upper lobe. Increased density at the right base probably represents a combination of effusion and consolidation. New slight haziness at the left lung base. Heart size and vascularity are normal. IMPRESSION: 1. Progressive infiltrates on the right with a probable new  right effusion. 2. New hazy infiltrate at the left lung  base. Electronically Signed   By: Lorriane Shire M.D.   On: 07/01/2017 15:41    Assessment/Plan: [name] is a 57 y.o. male with extensive PMH including recent admission for MRSA bacteremia associated with infected R portacath, inadequately treated after DC due to social issues, now readmitted with AMS, hypoglycemia, hypotension and respiratory failure..  He has stage III rectal adenocarcinoma status post laparoscopic LAR but he missed his fu chemo appointments and is outside window for adjunctive chemo per Oncology.  He has a long history of alcohol abuse, chronic pancreatitis and brittle poorly controlled diabetes. 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 He likely has Aspiration PNA.   He has neg BCX and neg Sputum cx. Surprisingly cleared his MRSA bacteremia despite not getting his Otpt vancomycin. 07/01/17 - wbc up to 39, hypothermic and back on pressors. Thick sputum as well and concern for aspiration due to family repositioning him.   Recommendations Cont zosyn and add vanco If having diarrhea check C diff Repeat CBC in Am Thank you very much for the consult. Will follow with you.  [name]   07/02/2017, 3:16 PM

## 2017-07-02 NOTE — Plan of Care (Signed)
Pt unchanged

## 2017-07-02 NOTE — Progress Notes (Addendum)
Pt left with air care at 1800 pt and pajama pants given to aunt that was at bedside.

## 2017-07-04 LAB — CULTURE, RESPIRATORY W GRAM STAIN

## 2017-07-04 LAB — CULTURE, RESPIRATORY: CULTURE: NORMAL

## 2017-07-19 MED ORDER — INFLUENZA VAC SPLIT QUAD 0.5 ML IM SUSY
0.50 | PREFILLED_SYRINGE | INTRAMUSCULAR | Status: DC
Start: ? — End: 2017-07-19

## 2017-07-19 MED ORDER — THIAMINE HCL 100 MG PO TABS
100.00 mg | ORAL_TABLET | ORAL | Status: DC
Start: 2017-07-20 — End: 2017-07-19

## 2017-07-19 MED ORDER — ENOXAPARIN SODIUM 40 MG/0.4ML ~~LOC~~ SOLN
40.00 mg | SUBCUTANEOUS | Status: DC
Start: 2017-07-19 — End: 2017-07-19

## 2017-07-19 MED ORDER — GENERIC EXTERNAL MEDICATION
Status: DC
Start: ? — End: 2017-07-19

## 2017-07-19 MED ORDER — FAMOTIDINE 40 MG/5ML PO SUSR
20.00 mg | ORAL | Status: DC
Start: 2017-07-19 — End: 2017-07-19

## 2017-07-19 MED ORDER — TAMSULOSIN HCL 0.4 MG PO CAPS
0.40 mg | ORAL_CAPSULE | ORAL | Status: DC
Start: 2017-07-19 — End: 2017-07-19

## 2017-07-19 MED ORDER — GENERIC EXTERNAL MEDICATION
5.00 mg | Status: DC
Start: ? — End: 2017-07-19

## 2017-07-19 MED ORDER — OLANZAPINE 10 MG PO TABS
5.00 mg | ORAL_TABLET | ORAL | Status: DC
Start: 2017-07-19 — End: 2017-07-19

## 2017-07-19 MED ORDER — LOPERAMIDE HCL 1 MG/7.5ML PO SUSP
4.00 mg | ORAL | Status: DC
Start: 2017-07-19 — End: 2017-07-19

## 2017-07-19 MED ORDER — INSULIN NPH (HUMAN) (ISOPHANE) 100 UNIT/ML ~~LOC~~ SUSP
2.00 | SUBCUTANEOUS | Status: DC
Start: 2017-07-19 — End: 2017-07-19

## 2017-07-19 MED ORDER — ACETAMINOPHEN 650 MG/20.3ML PO SOLN
650.00 mg | ORAL | Status: DC
Start: ? — End: 2017-07-19

## 2017-07-19 MED ORDER — ALBUTEROL SULFATE (2.5 MG/3ML) 0.083% IN NEBU
2.50 mg | INHALATION_SOLUTION | RESPIRATORY_TRACT | Status: DC
Start: ? — End: 2017-07-19

## 2017-07-19 MED ORDER — FIBER (GUAR GUM) PO
2.00 | ORAL | Status: DC
Start: 2017-07-19 — End: 2017-07-19

## 2017-07-19 MED ORDER — CEFEPIME HCL 2 GM/100ML IV SOLN
2.00 | INTRAVENOUS | Status: DC
Start: 2017-07-19 — End: 2017-07-19

## 2017-07-19 MED ORDER — DEXTROSE 50 % IV SOLN
50.00 | INTRAVENOUS | Status: DC
Start: ? — End: 2017-07-19

## 2017-07-19 MED ORDER — PNEUMOCOCCAL VAC POLYVALENT 25 MCG/0.5ML IJ INJ
.50 | INJECTION | INTRAMUSCULAR | Status: DC
Start: ? — End: 2017-07-19

## 2017-07-19 MED ORDER — SODIUM CHLORIDE 0.9 % IV SOLN
INTRAVENOUS | Status: DC
Start: ? — End: 2017-07-19

## 2017-07-19 MED ORDER — INSULIN LISPRO 100 UNIT/ML ~~LOC~~ SOLN
.00 | SUBCUTANEOUS | Status: DC
Start: 2017-07-20 — End: 2017-07-19

## 2017-09-28 DEATH — deceased

## 2019-05-26 IMAGING — CR DG CHEST 2V
2 series · 2 of 2 positions shown · non-contrast
Comparison: 08/16/2016

CLINICAL DATA: Weakness

EXAM:
CHEST  2 VIEW

[chest pa]
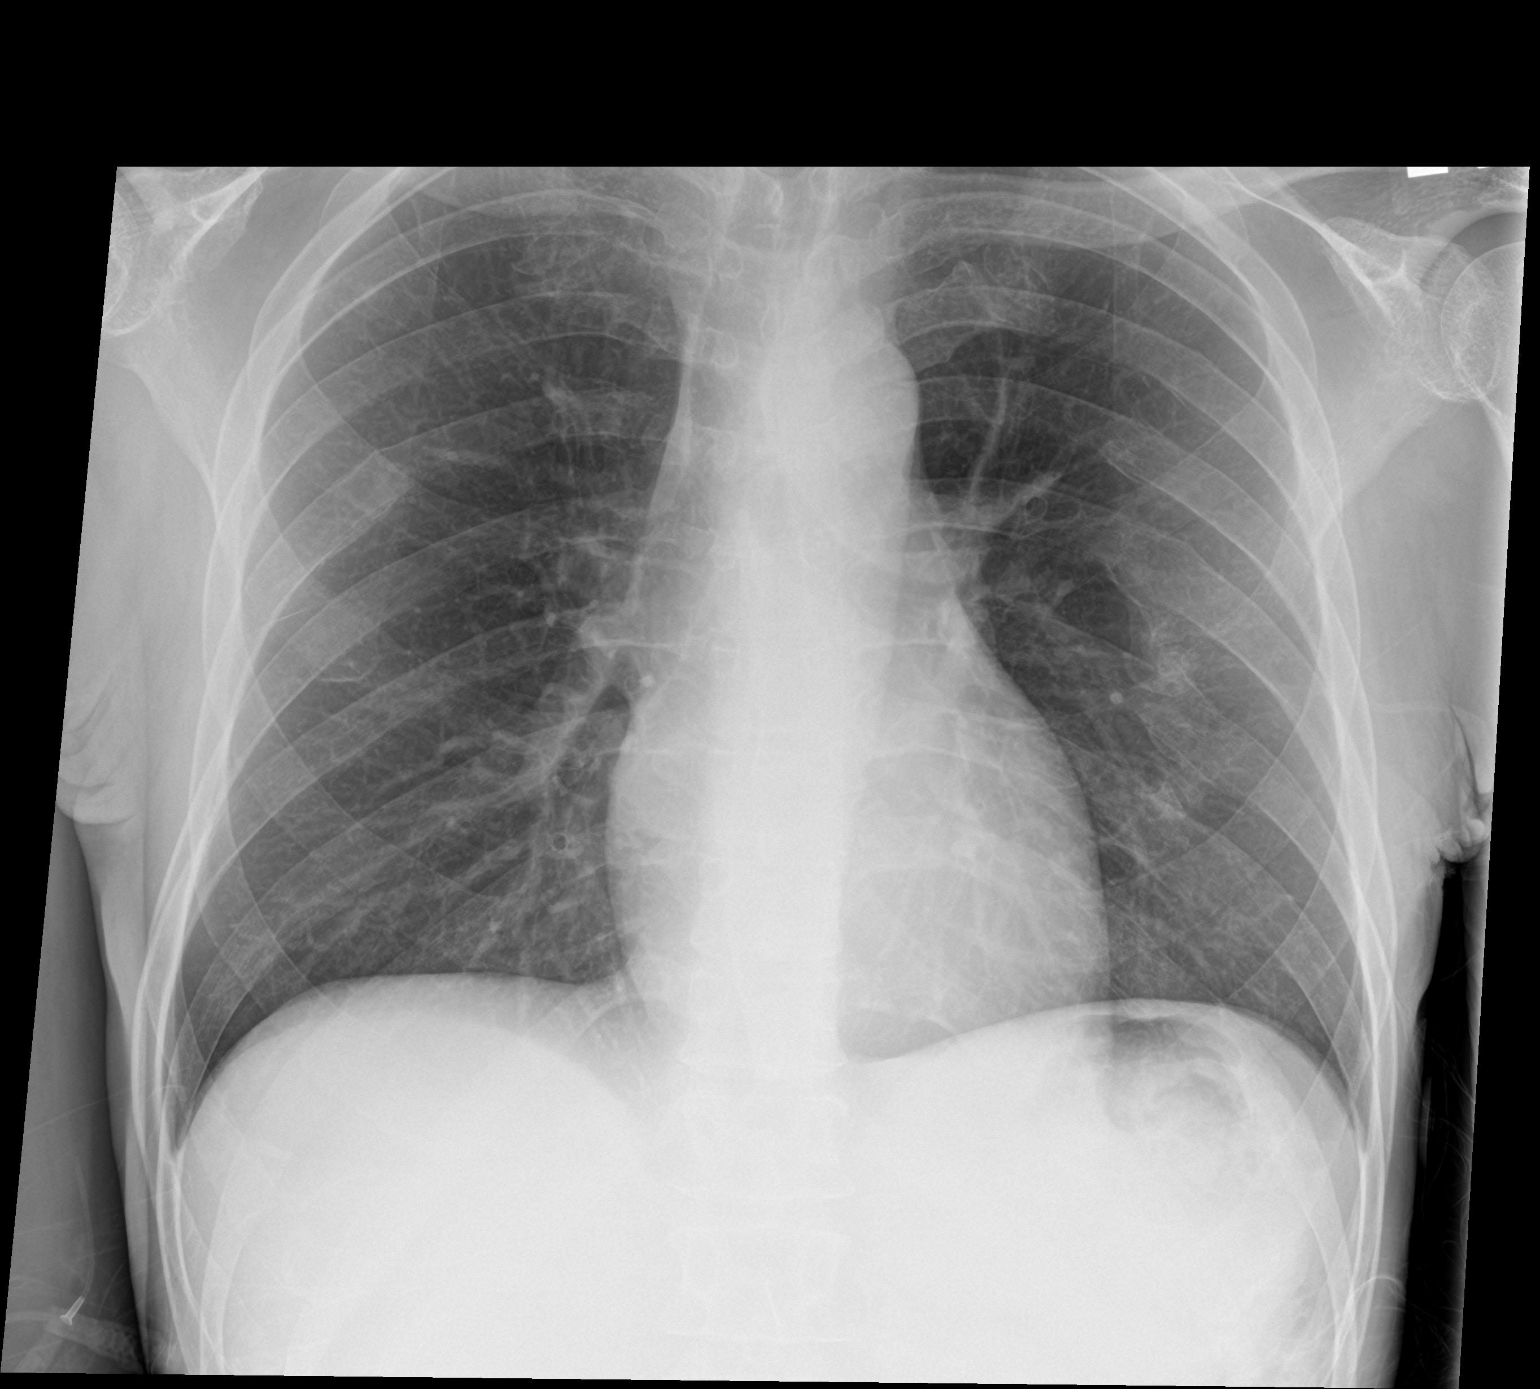

[chest lat]
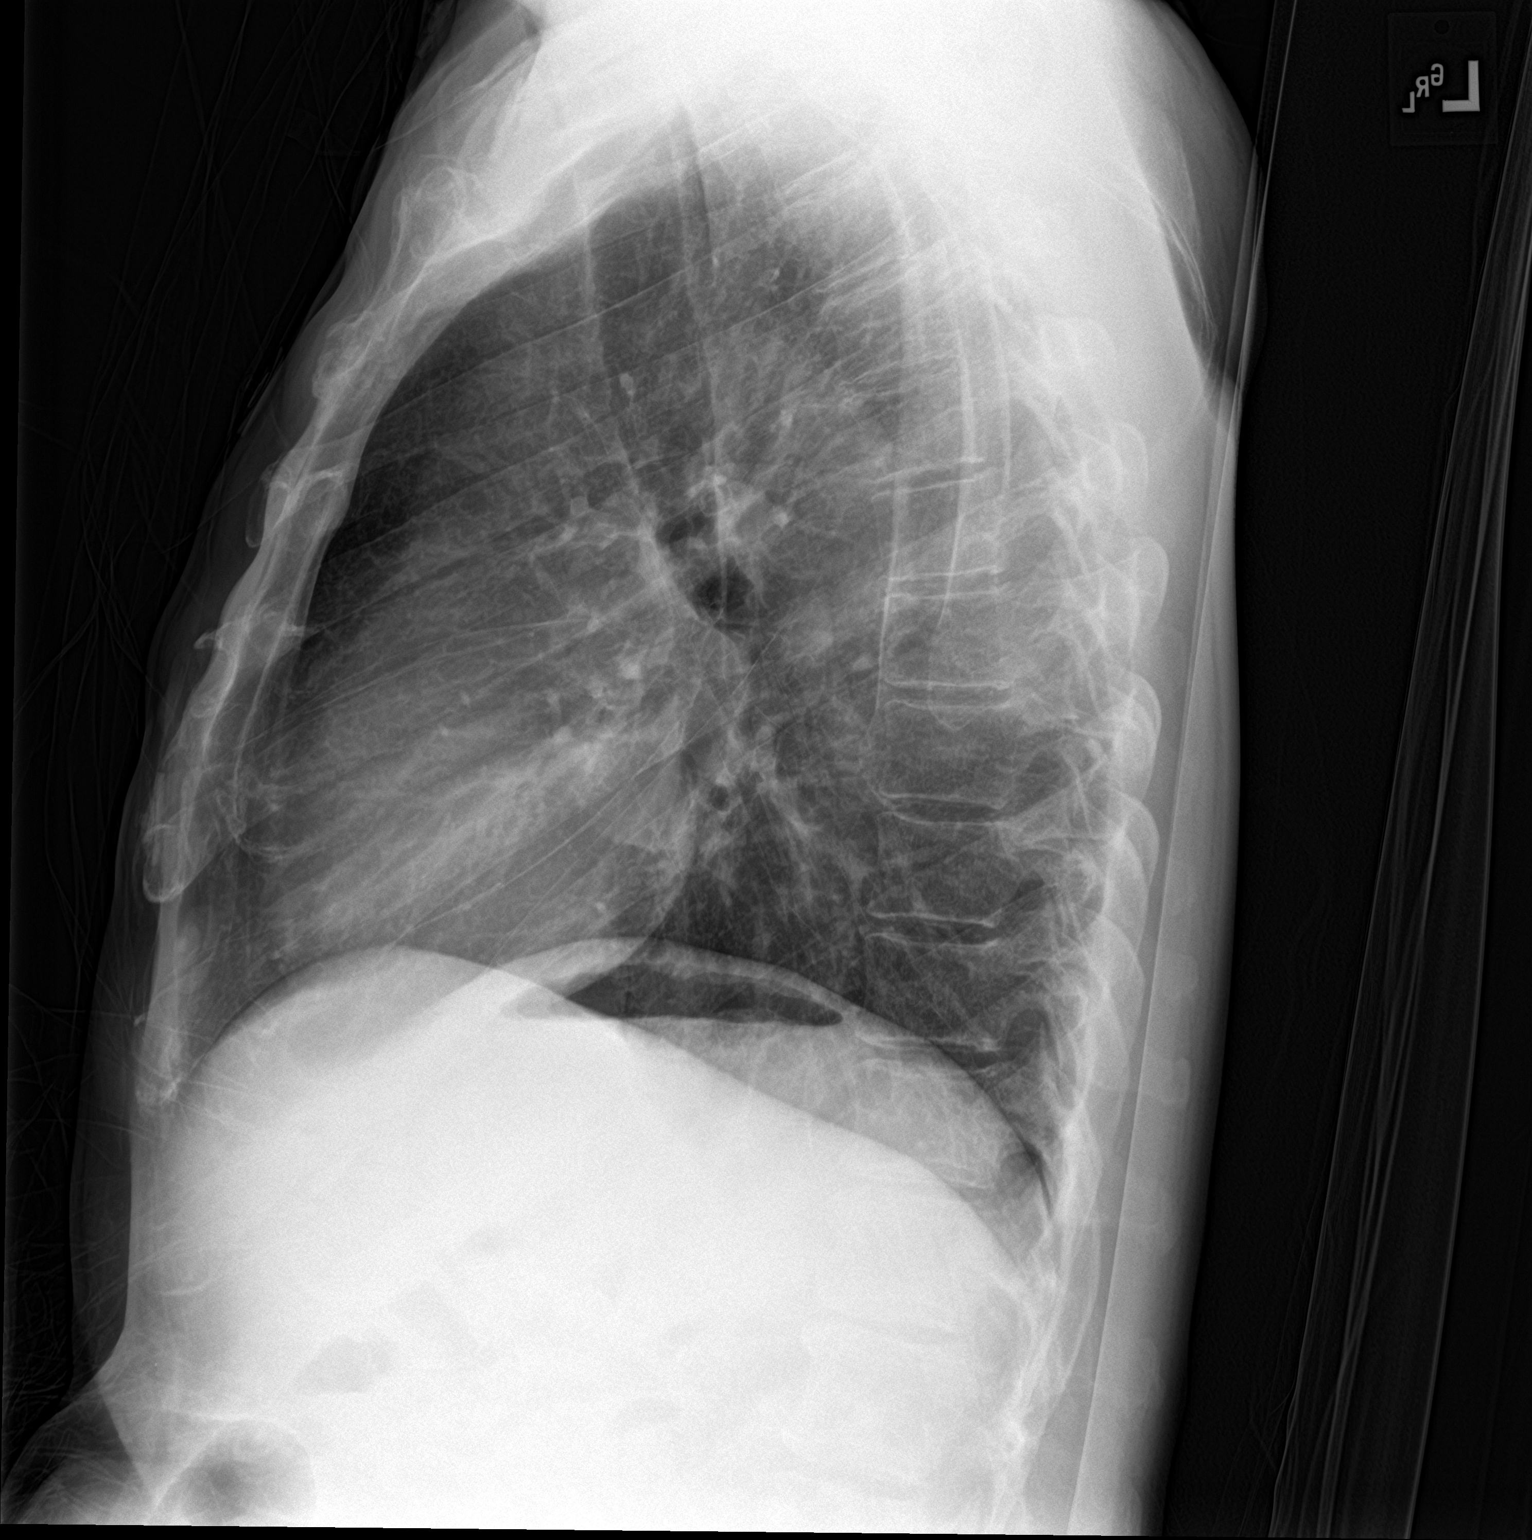

[2 of 2 positions shown; findings below may reference images not displayed]

FINDINGS: Cardiac shadow is within normal limits. The lungs are well aerated
bilaterally. Old rib fractures are seen bilaterally with healing. No
acute abnormality is noted.
IMPRESSION: No acute abnormality noted.

## 2019-05-26 IMAGING — CT CT HEAD W/O CM
3 series · 15 of 46 positions shown, 18 images · non-contrast
Comparison: 08/16/2016 head CT

CLINICAL DATA: Recurrent syncope, hypotension and weakness for
several days. Nausea and emesis today.

EXAM:
CT HEAD WITHOUT CONTRAST
TECHNIQUE: Contiguous axial images were obtained from the base of the skull
through the vertex without intravenous contrast.

[Series 2: head wo · axial · 0.47mm/px · z∈[-150,-30]mm · 9 of 29 slices shown, 12 images]
[im 3/29  brain]
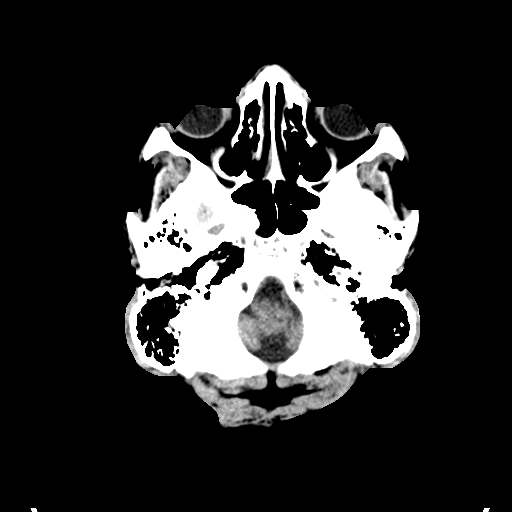
[im 3/29  bone]
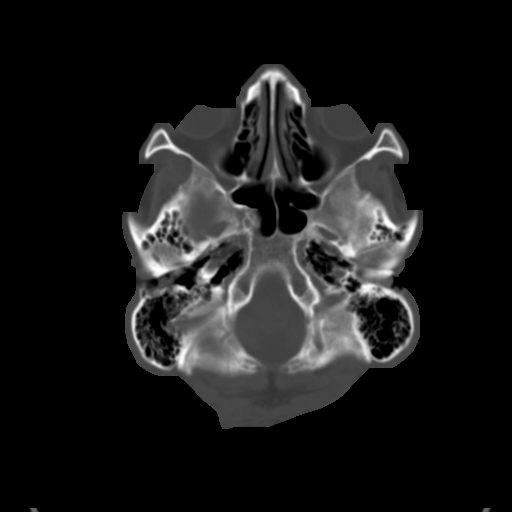
[im 6/29  brain]
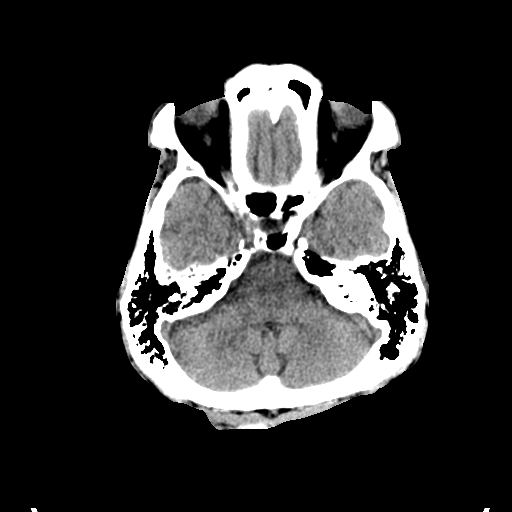
[im 9/29  brain]
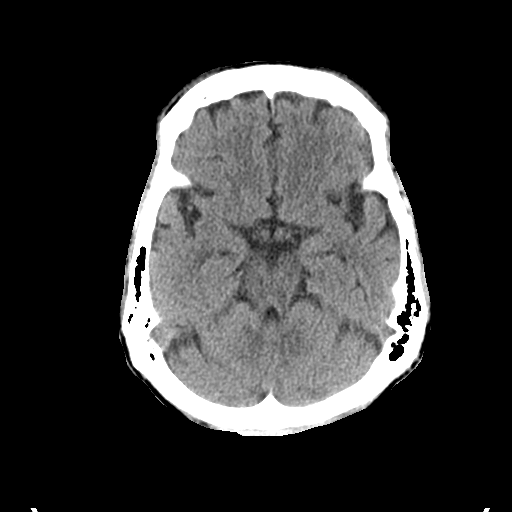
[im 12/29  brain]
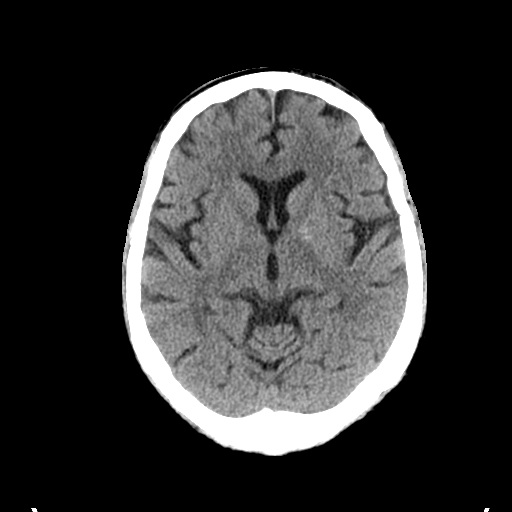
[im 15/29  brain]
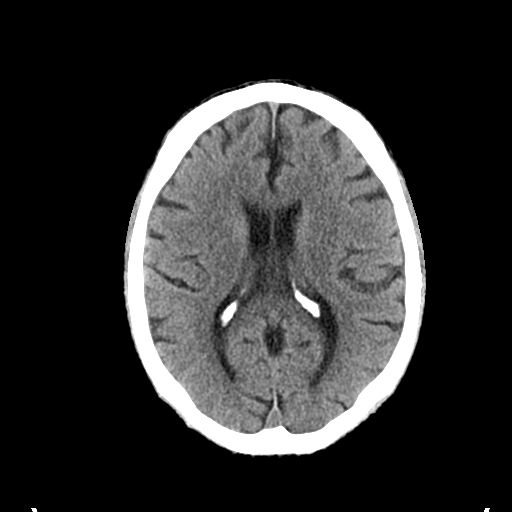
[im 15/29  bone]
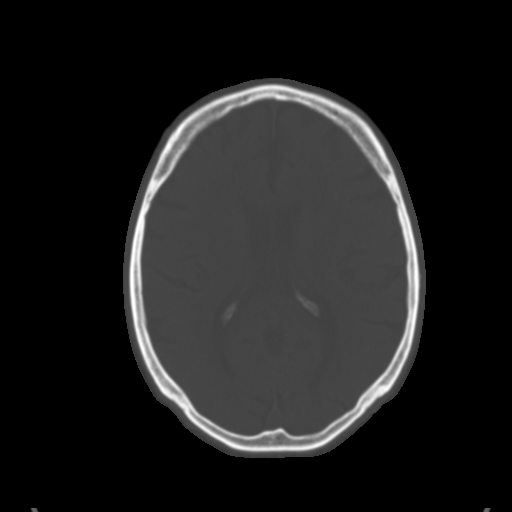
[im 18/29  brain]
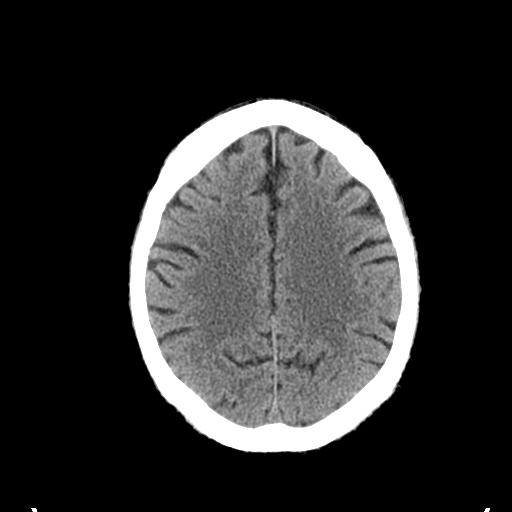
[im 21/29  brain]
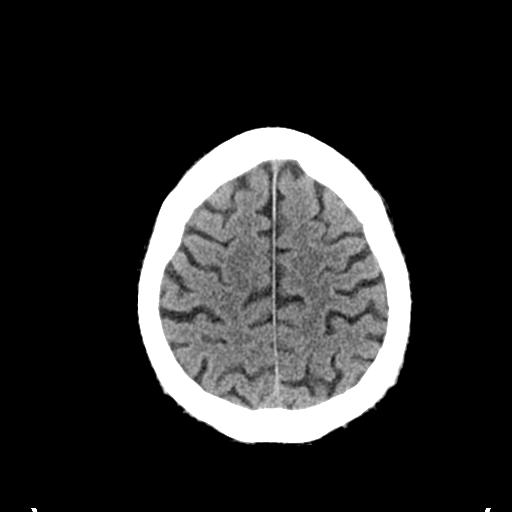
[im 24/29  brain]
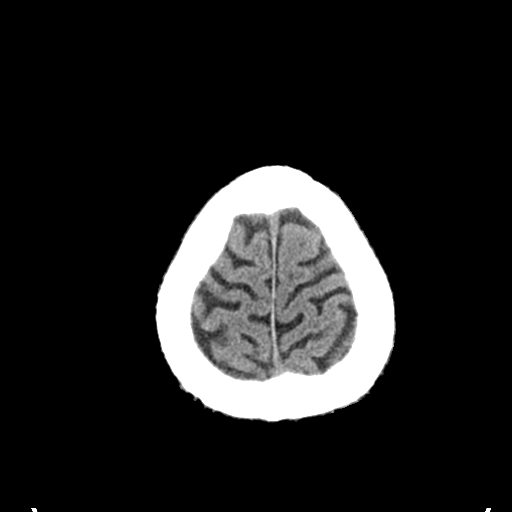
[im 27/29  brain]
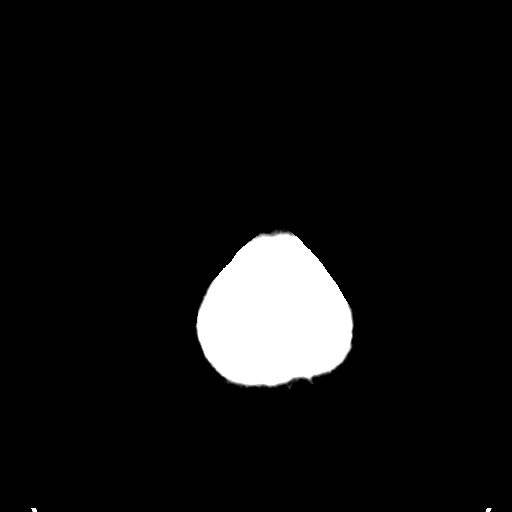
[im 27/29  bone]
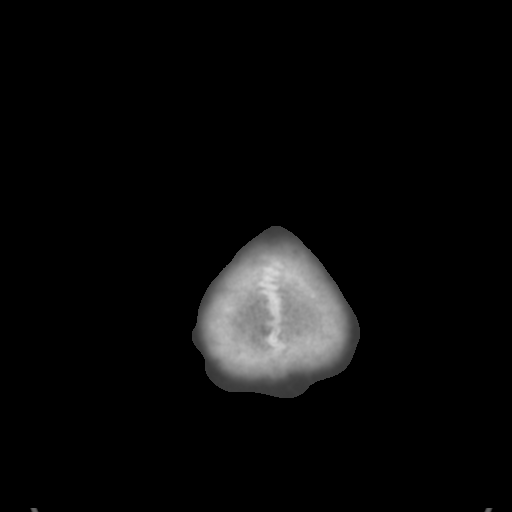

[Series 4: coronal soft tissue · coronal · 0.28mm/px · 3 of 63 slices shown]
[im 21/63  brain]
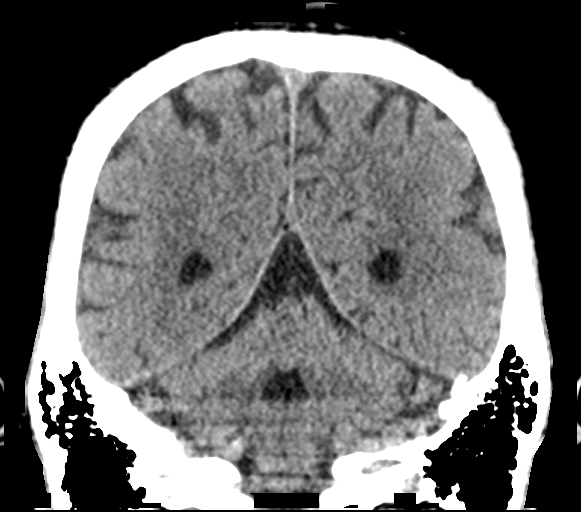
[im 28/63  brain]
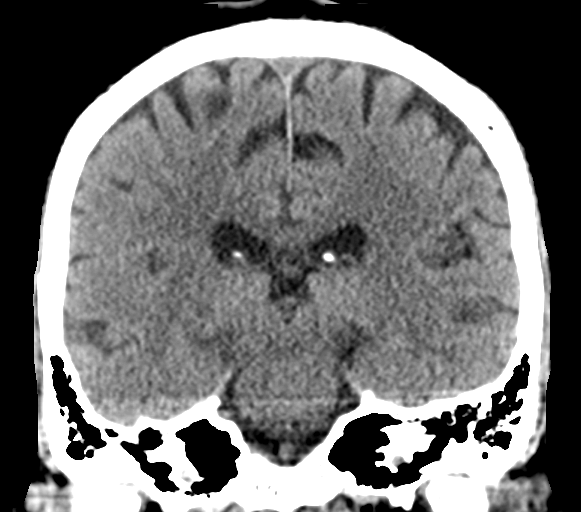
[im 35/63  brain]
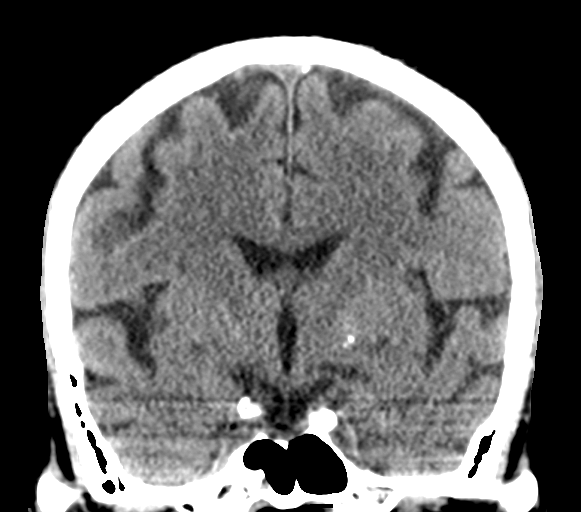

[Series 5: sagittal soft tissue · sagittal · 0.28mm/px · 3 of 52 slices shown]
[im 18/52  brain]
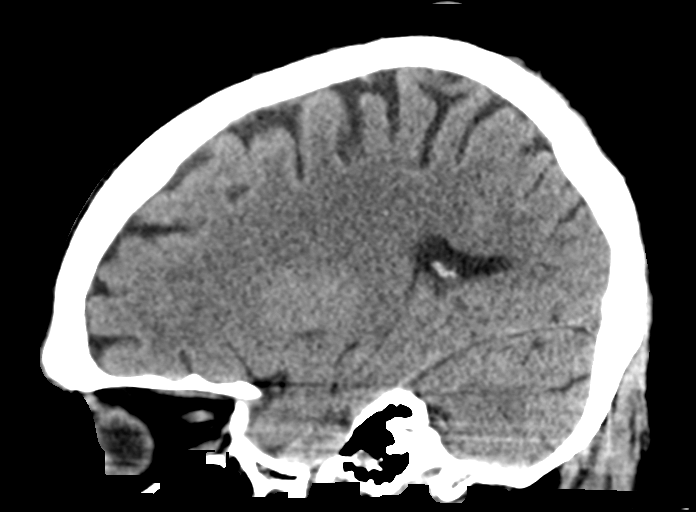
[im 26/52  brain]
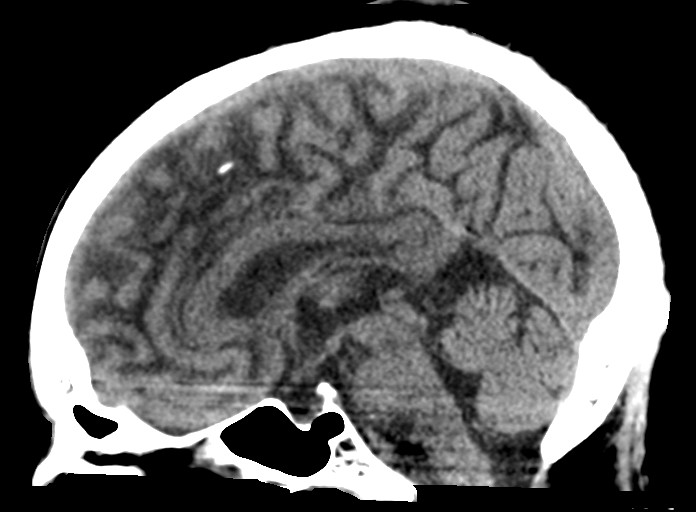
[im 35/52  brain]
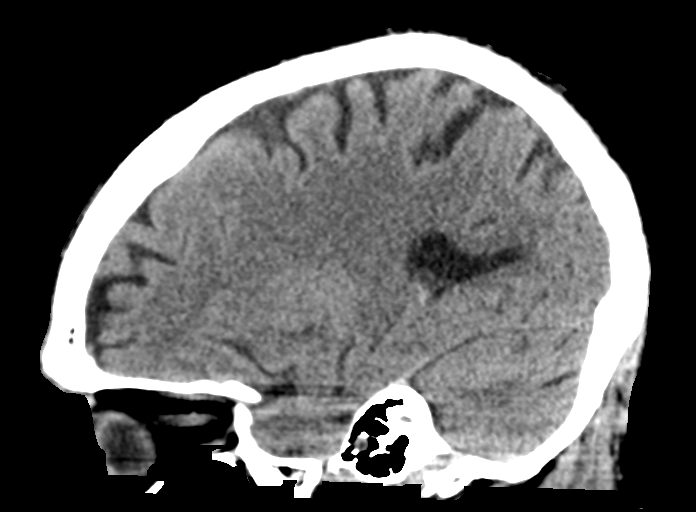

[15 of 46 positions shown; findings below may reference images not displayed]

FINDINGS: Brain: Chronic stable small vessel ischemic changes of
periventricular white matter. No large vascular territory
infarction. Idiopathic basal ganglial calcifications are noted
bilaterally left greater than right. No acute intracranial
hemorrhage, midline shift or edema. No extra-axial fluid
collections. No hydrocephalus. Basal cisterns and fourth ventricle
are midline.

Vascular: No hyperdense vessels or unexpected calcifications.

Skull: No primary bone lesion.  No skull fracture.

Sinuses/Orbits: Intact orbits and globes. No acute abnormality of
the visualized paranasal sinuses and mastoids.

Other: None
IMPRESSION: Chronic stable small vessel ischemic disease of periventricular
white matter. No acute intracranial abnormality.
